# Patient Record
Sex: Male | Born: 1965 | Race: Black or African American | Hispanic: No | Marital: Single | Smoking: Light tobacco smoker
Health system: Southern US, Community
[De-identification: ages and names within clinical notes are randomized; demographics above are authoritative.]

## PROBLEM LIST (undated history)

## (undated) DIAGNOSIS — K2970 Gastritis, unspecified, without bleeding: Secondary | ICD-10-CM

## (undated) DIAGNOSIS — R011 Cardiac murmur, unspecified: Secondary | ICD-10-CM

## (undated) DIAGNOSIS — A539 Syphilis, unspecified: Secondary | ICD-10-CM

## (undated) DIAGNOSIS — F1110 Opioid abuse, uncomplicated: Secondary | ICD-10-CM

## (undated) DIAGNOSIS — Z9289 Personal history of other medical treatment: Secondary | ICD-10-CM

## (undated) DIAGNOSIS — K8020 Calculus of gallbladder without cholecystitis without obstruction: Secondary | ICD-10-CM

## (undated) HISTORY — DX: Personal history of other medical treatment: Z92.89

## (undated) HISTORY — DX: Syphilis, unspecified: A53.9

---

## 2002-07-25 ENCOUNTER — Emergency Department: Admit: 2002-07-25 | Payer: Self-pay | Source: Emergency Department | Admitting: Emergency Medicine

## 2005-03-01 ENCOUNTER — Emergency Department: Admit: 2005-03-01 | Payer: Self-pay | Source: Emergency Department | Admitting: Emergency Medicine

## 2005-03-01 LAB — CBC WITH AUTO DIFFERENTIAL CERNER
Basophils Absolute: 0.1 /mm3 (ref 0.0–0.2)
Basophils: 1 % (ref 0–2)
Eosinophils Absolute: 0.4 /mm3 (ref 0.0–0.7)
Eosinophils: 6 % — ABNORMAL HIGH (ref 0–5)
Granulocytes Absolute: 3.9 /mm3 (ref 1.8–8.1)
Hematocrit: 40 % — ABNORMAL LOW (ref 42.0–52.0)
Hgb: 13.8 G/DL (ref 13.0–17.0)
Lymphocytes Absolute: 2.5 /mm3 (ref 0.5–4.4)
Lymphocytes: 33 % (ref 15–41)
MCH: 31.7 PG (ref 28.0–32.0)
MCHC: 34.4 G/DL (ref 32.0–36.0)
MCV: 92 FL (ref 80.0–100.0)
MPV: 8 FL (ref 7.4–10.4)
Monocytes Absolute: 0.8 /mm3 (ref 0.0–1.2)
Monocytes: 10 % (ref 0–11)
Neutrophils %: 51 % — ABNORMAL LOW (ref 52–75)
Platelets: 180 /mm3 (ref 140–400)
RBC: 4.35 /mm3 — ABNORMAL LOW (ref 4.70–6.00)
RDW: 13 % (ref 11.5–15.0)
WBC: 7.7 /mm3 (ref 3.5–10.8)

## 2005-03-01 LAB — BASIC METABOLIC PANEL
BUN: 13 mg/dL (ref 8–20)
CO2: 29 mEq/L (ref 21–30)
Calcium: 9.4 mg/dL (ref 8.6–10.2)
Chloride: 101 mEq/L (ref 98–107)
Creatinine: 0.9 mg/dL (ref 0.6–1.5)
Glucose: 83 mg/dL (ref 70–100)
Potassium: 4.4 mEq/L (ref 3.6–5.0)
Sodium: 138 mEq/L (ref 136–146)

## 2005-03-01 LAB — GFR

## 2005-03-01 LAB — LIPASE: Lipase: 85 U/L (ref 32–219)

## 2005-06-22 ENCOUNTER — Emergency Department: Admit: 2005-06-22 | Payer: Self-pay | Source: Emergency Department | Admitting: Pediatric Emergency Medicine

## 2006-03-19 ENCOUNTER — Emergency Department: Admit: 2006-03-19 | Payer: Self-pay | Source: Emergency Department | Admitting: Emergency Medicine

## 2006-03-19 LAB — CBC WITH AUTO DIFFERENTIAL CERNER
Basophils Absolute: 0.1 /mm3 (ref 0.0–0.2)
Basophils: 1 % (ref 0–2)
Eosinophils Absolute: 0.6 /mm3 — ABNORMAL HIGH (ref 0.0–0.2)
Eosinophils: 7 % — ABNORMAL HIGH (ref 0–5)
Granulocytes Absolute: 3.9 /mm3 (ref 1.8–8.1)
Hematocrit: 38.8 % — ABNORMAL LOW (ref 42.0–52.0)
Hgb: 12.9 G/DL — ABNORMAL LOW (ref 13.0–17.0)
Lymphocytes Absolute: 2.5 /mm3 (ref 0.5–4.4)
Lymphocytes: 32 % (ref 15–41)
MCH: 31.9 PG (ref 28.0–32.0)
MCHC: 33.2 G/DL (ref 32.0–36.0)
MCV: 95.9 FL (ref 80.0–100.0)
MPV: 8.3 FL (ref 7.4–10.4)
Monocytes Absolute: 0.7 /mm3 (ref 0.0–1.2)
Monocytes: 9 % (ref 0–11)
Neutrophils %: 51 % — ABNORMAL LOW (ref 52–75)
Platelets: 211 /mm3 (ref 140–400)
RBC: 4.05 /mm3 — ABNORMAL LOW (ref 4.70–6.00)
RDW: 13.3 % (ref 11.5–15.0)
WBC: 7.7 /mm3 (ref 3.5–10.8)

## 2006-03-19 LAB — COMPREHENSIVE METABOLIC PANEL - AH CERNER
ALT: 20 U/L (ref 0–41)
AST (SGOT): 30 U/L (ref 0–37)
Albumin/Globulin Ratio: 1.4 (ref 1.1–2.2)
Albumin: 4.4 g/dL (ref 3.4–4.8)
Alkaline Phosphatase: 68 U/L (ref 40–129)
Anion Gap: 8 mEq/L (ref 5–15)
BUN: 14 mg/dL (ref 6–20)
Bilirubin, Total: 0.6 mg/dL (ref 0.0–1.0)
CA: 4.1 mEq/L (ref 3.8–4.6)
CO2: 30.5 mEq/L — ABNORMAL HIGH (ref 22.0–29.0)
Calcium: 9.6 mg/dL (ref 8.4–10.2)
Chloride: 103 mEq/L (ref 96–108)
Creatinine: 1 mg/dL (ref 0.5–1.2)
Globulin: 3.1 g/dL (ref 2.0–3.6)
Glucose: 78 mg/dL
Osmolality Calculated: 291 mosm/kg (ref 282–298)
Potassium: 4.1 mEq/L (ref 3.3–5.1)
Protein, Total: 7.5 g/dL (ref 6.4–8.3)
Sodium: 141 mEq/L (ref 135–145)
UN/CREA SOFT: 14 RATIO (ref 6–33)

## 2006-03-19 LAB — URINALYSIS WITH MICROSCOPIC
Bilirubin, UA: NEGATIVE
Blood, UA: NEGATIVE
Glucose, UA: NEGATIVE
Ketones UA: NEGATIVE
Leukocyte Esterase, UA: NEGATIVE
Nitrite, UA: NEGATIVE
Protein, UR: NEGATIVE
Specific Gravity UA POCT: 1.026 (ref 1.005–1.030)
Urine pH: 5 (ref 4.6–8.0)
Urobilinogen, UA: 0.2 EU/dL (ref 0.2–1.0)

## 2006-03-19 LAB — CREATINE KINASE W/O REFLEX (SOFT): Creatine Kinase (CK): 429 U/L — ABNORMAL HIGH (ref 24–195)

## 2006-03-19 LAB — PT/INR
PT INR: 0.9 {INR} (ref 0.9–1.1)
PT: 11.2 s (ref 10.8–13.3)

## 2006-03-19 LAB — CKMB - AH CERNER: MB ACS: 4.63 NG/ML (ref 0.00–7.20)

## 2006-03-19 LAB — HEMOLYSIS INDEX: Hemolysis Index: 15 Units

## 2006-03-19 LAB — TROPONIN I: Troponin I: 0.01 ng/mL — AB (ref 0.10–1.30)

## 2006-03-19 LAB — GFR

## 2006-05-12 ENCOUNTER — Emergency Department: Admit: 2006-05-12 | Payer: Self-pay | Source: Emergency Department | Admitting: Emergency Medicine

## 2006-05-12 LAB — CBC WITH AUTO DIFFERENTIAL CERNER
Basophils Absolute: 0.1 /mm3 (ref 0.0–0.2)
Basophils: 2 % (ref 0–2)
Eosinophils Absolute: 0.3 /mm3 (ref 0.0–0.7)
Eosinophils: 4 % (ref 0–5)
Granulocytes Absolute: 3.9 /mm3 (ref 1.8–8.1)
Hematocrit: 37.3 % — ABNORMAL LOW (ref 42.0–52.0)
Hgb: 12.6 G/DL — ABNORMAL LOW (ref 13.0–17.0)
Lymphocytes Absolute: 2.4 /mm3 (ref 0.5–4.4)
Lymphocytes: 32 % (ref 15–41)
MCH: 31.2 PG (ref 28.0–32.0)
MCHC: 33.9 G/DL (ref 32.0–36.0)
MCV: 92.1 FL (ref 80.0–100.0)
MPV: 8.1 FL (ref 7.4–10.4)
Monocytes Absolute: 0.8 /mm3 (ref 0.0–1.2)
Monocytes: 11 % (ref 0–11)
Neutrophils %: 52 % (ref 52–75)
Platelets: 167 /mm3 (ref 140–400)
RBC: 4.05 /mm3 — ABNORMAL LOW (ref 4.70–6.00)
RDW: 13.4 % (ref 11.5–15.0)
WBC: 7.5 /mm3 (ref 3.5–10.8)

## 2006-05-12 LAB — BASIC METABOLIC PANEL
BUN: 10 mg/dL (ref 8–20)
CO2: 31 mEq/L — ABNORMAL HIGH (ref 21–30)
Calcium: 9.3 mg/dL (ref 8.6–10.2)
Chloride: 106 mEq/L (ref 98–107)
Creatinine: 1.1 mg/dL (ref 0.6–1.5)
Glucose: 70 mg/dL (ref 70–100)
Potassium: 3.8 mEq/L (ref 3.6–5.0)
Sodium: 142 mEq/L (ref 136–146)

## 2006-05-12 LAB — GFR

## 2006-05-12 LAB — I-STAT TROPONIN CERNER: i-STAT Troponin: 0 ng/mL

## 2007-08-15 ENCOUNTER — Emergency Department: Admit: 2007-08-15 | Payer: Self-pay | Source: Emergency Department

## 2007-08-20 ENCOUNTER — Emergency Department: Admit: 2007-08-20 | Payer: Self-pay | Source: Emergency Department | Admitting: Emergency Medicine

## 2007-08-20 LAB — RAPID INFLUENZA A/B ANTIGENS: Influenza Ag: NEGATIVE

## 2007-09-11 ENCOUNTER — Emergency Department: Admit: 2007-09-11 | Payer: Self-pay | Source: Emergency Department | Admitting: Emergency Medicine

## 2007-09-12 LAB — COMPREHENSIVE METABOLIC PANEL - AH CERNER
ALT: 19 U/L (ref 0–41)
AST (SGOT): 36 U/L (ref 0–37)
Albumin/Globulin Ratio: 1.4 (ref 1.1–2.2)
Albumin: 4.2 g/dL (ref 3.4–4.8)
Alkaline Phosphatase: 61 U/L (ref 40–129)
Anion Gap: 12 mEq/L (ref 5–15)
BUN: 16 mg/dL (ref 6–20)
Bilirubin, Total: 1.3 mg/dL — ABNORMAL HIGH (ref 0.0–1.0)
CA: 3.9 mEq/L (ref 3.8–4.6)
CO2: 24.5 mEq/L (ref 22.0–29.0)
Calcium: 9 mg/dL (ref 8.4–10.2)
Chloride: 101 mEq/L (ref 96–108)
Creatinine: 1 mg/dL (ref 0.5–1.2)
Globulin: 2.9 g/dL (ref 2.0–3.6)
Glucose: 80 mg/dL (ref 70–100)
Osmolality Calculated: 284 mosm/kg (ref 282–298)
Potassium: 3.7 mEq/L (ref 3.3–5.1)
Protein, Total: 7.1 g/dL (ref 6.4–8.3)
Sodium: 137 mEq/L (ref 133–145)
UN/CREA SOFT: 16 RATIO (ref 6–33)

## 2007-09-12 LAB — CBC AND DIFFERENTIAL
Basophils Absolute: 0 /mm3 (ref 0.0–0.2)
Basophils: 0 % (ref 0–2)
Eosinophils Absolute: 0.1 /mm3 (ref 0.0–0.7)
Eosinophils: 1 % (ref 0–5)
Granulocytes Absolute: 12.2 /mm3 — ABNORMAL HIGH (ref 1.8–8.1)
Hematocrit: 36.7 % — ABNORMAL LOW (ref 42.0–52.0)
Hgb: 13.1 G/DL (ref 13.0–17.0)
Immature Granulocytes Absolute: 0 CUMM (ref 0.0–0.0)
Immature Granulocytes: 0 % (ref 0–1)
Lymphocytes Absolute: 1.9 /mm3 (ref 0.5–4.4)
Lymphocytes: 12 % — ABNORMAL LOW (ref 15–41)
MCH: 32.3 PG — ABNORMAL HIGH (ref 28.0–32.0)
MCHC: 35.7 G/DL (ref 32.0–36.0)
MCV: 90.4 FL (ref 80.0–100.0)
MPV: 9.8 FL (ref 9.4–12.3)
Monocytes Absolute: 1.4 /mm3 — ABNORMAL HIGH (ref 0.0–1.2)
Monocytes: 9 % (ref 0–11)
Neutrophils %: 78 % — ABNORMAL HIGH (ref 52–75)
Platelets: 196 /mm3 (ref 140–400)
RBC: 4.06 /mm3 — ABNORMAL LOW (ref 4.70–6.00)
RDW: 13.3 % (ref 11.5–15.0)
WBC: 15.63 /mm3 — ABNORMAL HIGH (ref 3.50–10.80)

## 2007-09-12 LAB — AMYLASE: Amylase: 49 U/L (ref 20–148)

## 2007-09-12 LAB — TROPONIN I: Troponin I: 0 ng/mL — AB

## 2007-09-12 LAB — ETHANOL: Alcohol: 36 mg/dl

## 2007-09-12 LAB — LIPASE: Lipase: 35 U/L (ref 13–60)

## 2007-09-12 LAB — CKMB - AH CERNER
MB ACS: 9.36 NG/ML — ABNORMAL HIGH (ref 0.00–7.20)
MB Ind: 1.3 RI (ref 0.0–2.2)

## 2007-09-12 LAB — CREATINE KINASE W/O REFLEX (SOFT): Creatine Kinase (CK): 714 U/L — ABNORMAL HIGH (ref 24–195)

## 2007-09-12 LAB — HEMOLYSIS INDEX: Hemolysis Index: 4 Units

## 2007-09-12 LAB — GFR

## 2008-11-22 ENCOUNTER — Observation Stay
Admission: EM | Admit: 2008-11-22 | Discharge status: Against Medical Advice | Payer: Self-pay | Source: Emergency Department | Admitting: Internal Medicine

## 2008-11-22 LAB — COMPREHENSIVE METABOLIC PANEL
ALT: 12 U/L (ref 0–55)
AST (SGOT): 24 U/L (ref 5–34)
Albumin/Globulin Ratio: 1.2 (ref 0.9–2.2)
Albumin: 4.3 g/dL (ref 3.5–5.0)
Alkaline Phosphatase: 75 U/L (ref 40–150)
BUN: 15 mg/dL (ref 9–21)
Bilirubin, Total: 0.5 mg/dL (ref 0.2–1.2)
CO2: 13 mEq/L — ABNORMAL LOW (ref 22–29)
Calcium: 9.6 mg/dL (ref 8.5–10.5)
Chloride: 103 mEq/L (ref 96–107)
Creatinine: 1.5 mg/dL — ABNORMAL HIGH (ref 0.7–1.3)
Globulin: 3.5 g/dL (ref 2.0–3.6)
Glucose: 120 mg/dL — ABNORMAL HIGH (ref 70–110)
Potassium: 4 mEq/L (ref 3.5–5.1)
Protein, Total: 7.8 g/dL (ref 6.0–8.3)
Sodium: 138 mEq/L (ref 136–145)

## 2008-11-22 LAB — TROPONIN I: Troponin I: 0 ng/mL — AB

## 2008-11-22 LAB — CBC AND DIFFERENTIAL
Basophils Absolute: 0 /mm3 (ref 0.0–0.2)
Basophils: 0 % (ref 0–2)
Eosinophils Absolute: 0.2 /mm3 (ref 0.0–0.7)
Eosinophils: 1 % (ref 0–5)
Granulocytes Absolute: 12.4 /mm3 — ABNORMAL HIGH (ref 1.8–8.1)
Hematocrit: 37.2 % — ABNORMAL LOW (ref 42.0–52.0)
Hgb: 12.9 G/DL — ABNORMAL LOW (ref 13.0–17.0)
Immature Granulocytes Absolute: 0.1 CUMM — ABNORMAL HIGH (ref 0.0–0.0)
Immature Granulocytes: 1 % (ref 0–1)
Lymphocytes Absolute: 2.6 /mm3 (ref 0.5–4.4)
Lymphocytes: 16 % (ref 15–41)
MCH: 31.3 PG (ref 28.0–32.0)
MCHC: 34.7 G/DL (ref 32.0–36.0)
MCV: 90.3 FL (ref 80.0–100.0)
MPV: 10.4 FL (ref 9.4–12.3)
Monocytes Absolute: 1 /mm3 (ref 0.0–1.2)
Monocytes: 6 % (ref 0–11)
Neutrophils %: 76 % — ABNORMAL HIGH (ref 52–75)
Platelets: 213 /mm3 (ref 140–400)
RBC: 4.12 /mm3 — ABNORMAL LOW (ref 4.70–6.00)
RDW: 12.7 % (ref 11.5–15.0)
WBC: 16.22 /mm3 — ABNORMAL HIGH (ref 3.50–10.80)

## 2008-11-22 LAB — CKMB MASS CERNER: CKMB Mass: 1.9 ng/mL (ref 0.0–5.0)

## 2008-11-22 LAB — DRUG SCREEN,URINE RANDOM CERNER

## 2008-11-22 LAB — URINALYSIS WITH MICROSCOPIC
Bilirubin, UA: NEGATIVE
Blood, UA: NEGATIVE
Glucose, UA: NEGATIVE
Hyaline Casts, UA: 3 /LPF — ABNORMAL HIGH (ref 0–2)
Ketones UA: NEGATIVE
Leukocyte Esterase, UA: NEGATIVE
Nitrite, UA: NEGATIVE
Protein, UR: 100 — AB
RBC, UA: 1 /HPF (ref 0–3)
Specific Gravity UA POCT: 1.015 (ref 1.005–1.030)
Urine pH: 5 (ref 4.6–8.0)
Urobilinogen, UA: NORMAL mg/dL
WBC, UA: 1 /HPF (ref 0–5)

## 2008-11-22 LAB — PT AND APTT
PT INR: 1.1 {INR} (ref 0.9–1.1)
PT: 13.2 s (ref 10.8–13.3)
PTT: 23 s (ref 21–32)

## 2008-11-22 LAB — TYPE AND SCREEN: AB Screen Gel: NEGATIVE

## 2008-11-22 LAB — GFR

## 2008-11-22 LAB — CREATINE KINASE W/O REFLEX (SOFT): Creatine Kinase (CK): 363 U/L — ABNORMAL HIGH (ref 30–200)

## 2008-11-22 LAB — ETHANOL: Alcohol: <10 mg/dl

## 2008-11-22 LAB — HEMOLYSIS INDEX: Hemolysis Index: 10 Units

## 2008-11-22 LAB — B-TYPE NATRIURETIC PEPTIDE: B-Natriuretic Peptide: 40 pg/mL (ref ?–100)

## 2008-11-23 LAB — PT/INR
PT INR: 1.1 {INR} (ref 0.9–1.1)
PT: 13.3 s (ref 10.8–13.3)

## 2008-11-23 LAB — CBC
Hematocrit: 33.4 % — ABNORMAL LOW (ref 42.0–52.0)
Hgb: 11.4 G/DL — ABNORMAL LOW (ref 13.0–17.0)
MCH: 30.4 PG (ref 28.0–32.0)
MCHC: 34.1 G/DL (ref 32.0–36.0)
MCV: 89.1 FL (ref 80.0–100.0)
MPV: 10.1 FL (ref 9.4–12.3)
Platelets: 178 /mm3 (ref 140–400)
RBC: 3.75 /mm3 — ABNORMAL LOW (ref 4.70–6.00)
RDW: 12.8 % (ref 11.5–15.0)
WBC: 15.93 /mm3 — ABNORMAL HIGH (ref 3.50–10.80)

## 2008-11-23 LAB — ACETAMINOPHEN LEVEL: Acetaminophen Level: <3 ug/mL (ref 10–30)

## 2008-11-23 LAB — SALICYLATE LEVEL: Salicylate Level: <5 mg/dL (ref 3–25)

## 2008-11-23 LAB — COMPREHENSIVE METABOLIC PANEL
ALT: 10 U/L (ref 0–55)
AST (SGOT): 23 U/L (ref 5–34)
Albumin/Globulin Ratio: 1.3 (ref 0.9–2.2)
Albumin: 3.2 g/dL — ABNORMAL LOW (ref 3.5–5.0)
Alkaline Phosphatase: 64 U/L (ref 40–150)
BUN: 16 mg/dL (ref 9–21)
Bilirubin, Total: 1 mg/dL (ref 0.2–1.2)
CO2: 19 mEq/L — ABNORMAL LOW (ref 22–29)
Calcium: 7.8 mg/dL — ABNORMAL LOW (ref 8.5–10.5)
Chloride: 110 mEq/L — ABNORMAL HIGH (ref 96–107)
Creatinine: 1.5 mg/dL — ABNORMAL HIGH (ref 0.7–1.3)
Globulin: 2.5 g/dL (ref 2.0–3.6)
Glucose: 94 mg/dL (ref 70–110)
Potassium: 3.9 mEq/L (ref 3.5–5.1)
Protein, Total: 5.7 g/dL — ABNORMAL LOW (ref 6.0–8.3)
Sodium: 138 mEq/L (ref 136–145)

## 2008-11-23 LAB — CKMB MASS CERNER
CKMB Mass: 2.8 ng/mL (ref 0.0–5.0)
CKMB Mass: 2.9 ng/mL (ref 0.0–5.0)

## 2008-11-23 LAB — CREATINE KINASE W/O REFLEX (SOFT)
Creatine Kinase (CK): 512 U/L — ABNORMAL HIGH (ref 30–200)
Creatine Kinase (CK): 666 U/L — ABNORMAL HIGH (ref 30–200)

## 2008-11-23 LAB — TROPONIN I
Troponin I: 0.01 ng/mL
Troponin I: 0 ng/mL — AB

## 2008-11-23 LAB — ETHANOL: Alcohol: <10 mg/dl

## 2008-11-23 LAB — HEMOLYSIS INDEX: Hemolysis Index: 14 Units

## 2008-11-25 ENCOUNTER — Inpatient Hospital Stay
Admission: EM | Admit: 2008-11-25 | Disposition: A | Discharge status: Home / Self Care | Payer: Self-pay | Source: Emergency Department | Admitting: Internal Medicine

## 2008-11-25 LAB — GFR

## 2008-11-25 LAB — COMPREHENSIVE METABOLIC PANEL
ALT: 20 U/L (ref 0–55)
AST (SGOT): 54 U/L — ABNORMAL HIGH (ref 5–34)
Albumin/Globulin Ratio: 1.1 (ref 0.9–2.2)
Albumin: 3.3 g/dL — ABNORMAL LOW (ref 3.5–5.0)
Alkaline Phosphatase: 53 U/L (ref 40–150)
BUN: 13 mg/dL (ref 9–21)
Bilirubin, Total: 0.9 mg/dL (ref 0.2–1.2)
CO2: 22 mEq/L (ref 22–29)
Calcium: 9 mg/dL (ref 8.5–10.5)
Chloride: 111 mEq/L — ABNORMAL HIGH (ref 96–107)
Creatinine: 2 mg/dL — ABNORMAL HIGH (ref 0.7–1.3)
Globulin: 2.9 g/dL (ref 2.0–3.6)
Glucose: 129 mg/dL — ABNORMAL HIGH (ref 70–110)
Potassium: 4.3 mEq/L (ref 3.5–5.1)
Protein, Total: 6.2 g/dL (ref 6.0–8.3)
Sodium: 142 mEq/L (ref 136–145)

## 2008-11-25 LAB — CBC AND DIFFERENTIAL
Basophils Absolute: 0 /mm3 (ref 0.0–0.2)
Basophils Absolute: 0 /mm3 (ref 0.0–0.2)
Basophils: 0 % (ref 0–2)
Basophils: 0 % (ref 0–2)
Eosinophils Absolute: 0.3 /mm3 (ref 0.0–0.7)
Eosinophils Absolute: 0.2 /mm3 (ref 0.0–0.7)
Eosinophils: 2 % (ref 0–5)
Eosinophils: 2 % (ref 0–5)
Granulocytes Absolute: 6.8 /mm3 (ref 1.8–8.1)
Granulocytes Absolute: 5.8 /mm3 (ref 1.8–8.1)
Hematocrit: 32.5 % — ABNORMAL LOW (ref 42.0–52.0)
Hematocrit: 31.5 % — ABNORMAL LOW (ref 42.0–52.0)
Hgb: 11.2 G/DL — ABNORMAL LOW (ref 13.0–17.0)
Hgb: 10.8 G/DL — ABNORMAL LOW (ref 13.0–17.0)
Immature Granulocytes Absolute: 0 CUMM (ref 0.0–0.0)
Immature Granulocytes Absolute: 0 CUMM (ref 0.0–0.0)
Immature Granulocytes: 0 % (ref 0–1)
Immature Granulocytes: 0 % (ref 0–1)
Lymphocytes Absolute: 2.7 /mm3 (ref 0.5–4.4)
Lymphocytes Absolute: 1.8 /mm3 (ref 0.5–4.4)
Lymphocytes: 25 % (ref 15–41)
Lymphocytes: 21 % (ref 15–41)
MCH: 30.7 PG (ref 28.0–32.0)
MCH: 30.7 PG (ref 28.0–32.0)
MCHC: 34.5 G/DL (ref 32.0–36.0)
MCHC: 34.3 G/DL (ref 32.0–36.0)
MCV: 89 FL (ref 80.0–100.0)
MCV: 89.5 FL (ref 80.0–100.0)
MPV: 10.6 FL (ref 9.4–12.3)
MPV: 10.4 FL (ref 9.4–12.3)
Monocytes Absolute: 1.1 /mm3 (ref 0.0–1.2)
Monocytes Absolute: 0.9 /mm3 (ref 0.0–1.2)
Monocytes: 11 % (ref 0–11)
Monocytes: 10 % (ref 0–11)
Neutrophils %: 62 % (ref 52–75)
Neutrophils %: 67 % (ref 52–75)
Platelets: 176 /mm3 (ref 140–400)
Platelets: 166 /mm3 (ref 140–400)
RBC: 3.65 /mm3 — ABNORMAL LOW (ref 4.70–6.00)
RBC: 3.52 /mm3 — ABNORMAL LOW (ref 4.70–6.00)
RDW: 13 % (ref 11.5–15.0)
RDW: 13 % (ref 11.5–15.0)
WBC: 10.9 /mm3 — ABNORMAL HIGH (ref 3.50–10.80)
WBC: 8.64 /mm3 (ref 3.50–10.80)

## 2008-11-25 LAB — DRUG SCREEN,URINE RANDOM CERNER

## 2008-11-25 LAB — URINALYSIS WITH MICROSCOPIC
Bilirubin, UA: NEGATIVE
Blood, UA: NEGATIVE
Glucose, UA: NEGATIVE
Ketones UA: NEGATIVE
Leukocyte Esterase, UA: NEGATIVE
Nitrite, UA: NEGATIVE
Protein, UR: NEGATIVE
RBC, UA: 1 /HPF (ref 0–3)
Specific Gravity UA POCT: 1.005 (ref 1.005–1.030)
Urine pH: 6 (ref 4.6–8.0)
Urobilinogen, UA: NORMAL mg/dL
WBC, UA: 5 /HPF (ref 0–5)

## 2008-11-25 LAB — TROPONIN I
Troponin I: 0 ng/mL — AB
Troponin I: 0 ng/mL — AB

## 2008-11-25 LAB — CKMB MASS CERNER
CKMB Mass: 2 ng/mL (ref 0.0–5.0)
CKMB Mass: 1.5 ng/mL (ref 0.0–5.0)

## 2008-11-25 LAB — CREATINE KINASE W/O REFLEX (SOFT)
Creatine Kinase (CK): 2215 U/L — ABNORMAL HIGH (ref 30–200)
Creatine Kinase (CK): 2039 U/L — ABNORMAL HIGH (ref 30–200)

## 2008-11-25 LAB — HEMOLYSIS INDEX
Hemolysis Index: 37 Units
Hemolysis Index: 13 Units

## 2008-11-25 LAB — ETHANOL: Alcohol: <10 mg/dl

## 2008-11-26 LAB — HEMOGLOBIN A1C: Hemoglobin A1C: 5.7 % (ref ?–6.0)

## 2008-11-26 LAB — COMPREHENSIVE METABOLIC PANEL
ALT: 26 U/L (ref 0–55)
AST (SGOT): 61 U/L — ABNORMAL HIGH (ref 5–34)
Albumin/Globulin Ratio: 1.1 (ref 0.9–2.2)
Albumin: 3.1 g/dL — ABNORMAL LOW (ref 3.5–5.0)
Alkaline Phosphatase: 57 U/L (ref 40–150)
BUN: 11 mg/dL (ref 9–21)
Bilirubin, Total: 1.1 mg/dL (ref 0.2–1.2)
CO2: 25 mEq/L (ref 22–29)
Calcium: 8.6 mg/dL (ref 8.5–10.5)
Chloride: 110 mEq/L — ABNORMAL HIGH (ref 96–107)
Creatinine: 1.5 mg/dL — ABNORMAL HIGH (ref 0.7–1.3)
Globulin: 2.8 g/dL (ref 2.0–3.6)
Glucose: 84 mg/dL (ref 70–110)
Potassium: 4.5 mEq/L (ref 3.5–5.1)
Protein, Total: 5.9 g/dL — ABNORMAL LOW (ref 6.0–8.3)
Sodium: 141 mEq/L (ref 136–145)

## 2008-11-26 LAB — PT/INR
PT INR: 1.1 {INR} (ref 0.9–1.1)
PT: 13.2 s (ref 10.8–13.3)

## 2008-11-26 LAB — APTT: PTT: 27 s (ref 21–32)

## 2008-11-26 LAB — TROPONIN I: Troponin I: 0 ng/mL — AB

## 2008-11-26 LAB — PHOSPHORUS: Phosphorus: 3.9 MG/DL (ref 2.3–4.7)

## 2008-11-26 LAB — CKMB MASS CERNER: CKMB Mass: 1.1 ng/mL (ref 0.0–5.0)

## 2008-11-26 LAB — HEMOLYSIS INDEX: Hemolysis Index: 3 Units

## 2008-11-26 LAB — CREATINE KINASE W/O REFLEX (SOFT): Creatine Kinase (CK): 2501 U/L — AB (ref 30–200)

## 2008-11-26 LAB — TSH: TSH: 0.452 u[IU]/mL (ref 0.340–4.820)

## 2008-11-26 LAB — MAGNESIUM: Magnesium: 2.2 MG/DL (ref 1.3–2.6)

## 2008-11-27 LAB — COMPREHENSIVE METABOLIC PANEL
ALT: 26 U/L (ref 0–55)
AST (SGOT): 56 U/L — ABNORMAL HIGH (ref 5–34)
Albumin/Globulin Ratio: 1.2 (ref 0.9–2.2)
Albumin: 3 g/dL — ABNORMAL LOW (ref 3.5–5.0)
Alkaline Phosphatase: 51 U/L (ref 40–150)
BUN: 7 mg/dL — ABNORMAL LOW (ref 9–21)
Bilirubin, Total: 1 mg/dL (ref 0.2–1.2)
CO2: 25 mEq/L (ref 22–29)
Calcium: 8.4 mg/dL — ABNORMAL LOW (ref 8.5–10.5)
Chloride: 111 mEq/L — ABNORMAL HIGH (ref 96–107)
Creatinine: 1.2 mg/dL (ref 0.7–1.3)
Globulin: 2.6 g/dL (ref 2.0–3.6)
Glucose: 86 mg/dL (ref 70–110)
Potassium: 3.9 mEq/L (ref 3.5–5.1)
Protein, Total: 5.6 g/dL — ABNORMAL LOW (ref 6.0–8.3)
Sodium: 142 mEq/L (ref 136–145)

## 2008-11-27 LAB — CKMB MASS CERNER: CKMB Mass: 0.6 ng/mL — AB (ref 0.0–5.0)

## 2008-11-27 LAB — GFR

## 2008-11-27 LAB — HEMOLYSIS INDEX: Hemolysis Index: 1 Units

## 2008-11-27 LAB — H. PYLORI ANTIBODY, IGG: H Pylori Index: NEGATIVE

## 2008-11-27 LAB — CREATINE KINASE W/O REFLEX (SOFT): Creatine Kinase (CK): 2245 U/L — ABNORMAL HIGH (ref 30–200)

## 2008-11-28 LAB — COMPREHENSIVE METABOLIC PANEL
ALT: 25 U/L (ref 0–55)
AST (SGOT): 41 U/L — ABNORMAL HIGH (ref 5–34)
Albumin/Globulin Ratio: 1.1 (ref 0.9–2.2)
Albumin: 3.1 g/dL — ABNORMAL LOW (ref 3.5–5.0)
Alkaline Phosphatase: 52 U/L (ref 40–150)
BUN: 5 mg/dL — ABNORMAL LOW (ref 9–21)
Bilirubin, Total: 0.8 mg/dL (ref 0.2–1.2)
CO2: 26 mEq/L (ref 22–29)
Calcium: 8.8 mg/dL (ref 8.5–10.5)
Chloride: 109 mEq/L — ABNORMAL HIGH (ref 96–107)
Creatinine: 1.1 mg/dL (ref 0.7–1.3)
Globulin: 2.7 g/dL (ref 2.0–3.6)
Glucose: 85 mg/dL (ref 70–110)
Potassium: 3.8 mEq/L (ref 3.5–5.1)
Protein, Total: 5.8 g/dL — ABNORMAL LOW (ref 6.0–8.3)
Sodium: 143 mEq/L (ref 136–145)

## 2008-11-28 LAB — HEMOLYSIS INDEX: Hemolysis Index: 1 Units

## 2008-11-28 LAB — GFR

## 2008-11-28 LAB — CKMB MASS CERNER: CKMB Mass: 0.4 ng/mL — AB (ref 0.0–5.0)

## 2008-11-28 LAB — CREATINE KINASE W/O REFLEX (SOFT): Creatine Kinase (CK): 1332 U/L — ABNORMAL HIGH (ref 30–200)

## 2009-06-27 ENCOUNTER — Emergency Department: Admit: 2009-06-27 | Payer: Self-pay | Source: Emergency Department | Admitting: Emergency Medicine

## 2009-06-27 LAB — CBC AND DIFFERENTIAL
Baso(Absolute): 0.04 10*3/uL (ref 0.00–0.20)
Basophils: 0 % (ref 0–2)
Eosinophils Absolute: 0.22 10*3/uL (ref 0.00–0.70)
Eosinophils: 2 % (ref 0–5)
Hematocrit: 38.9 % — ABNORMAL LOW (ref 42.0–52.0)
Hgb: 13.7 g/dL (ref 13.0–17.0)
Immature Granulocytes Absolute: 0.05 10*3/uL
Immature Granulocytes: 0 % (ref 0–1)
Lymphocytes Absolute: 2.3 10*3/uL (ref 0.50–4.40)
Lymphocytes: 17 % (ref 15–41)
MCH: 30.9 pg (ref 28.0–32.0)
MCHC: 35.2 g/dL (ref 32.0–36.0)
MCV: 87.8 fL (ref 80.0–100.0)
MPV: 10.3 fL (ref 9.4–12.3)
Monocytes Absolute: 0.65 10*3/uL (ref 0.00–1.20)
Monocytes: 5 % (ref 0–11)
Neutrophils Absolute: 10.64 10*3/uL
Neutrophils: 77 % — ABNORMAL HIGH (ref 52–75)
Platelets: 234 10*3/uL (ref 140–400)
RBC: 4.43 10*6/uL — ABNORMAL LOW (ref 4.70–6.00)
RDW: 13 % (ref 12–15)
WBC: 13.85 10*3/uL — ABNORMAL HIGH (ref 3.50–10.80)

## 2009-06-27 LAB — COMPREHENSIVE METABOLIC PANEL
ALT: 14 U/L (ref 0–55)
AST (SGOT): 20 U/L (ref 5–34)
Albumin/Globulin Ratio: 1.2 (ref 0.9–2.2)
Albumin: 4.1 g/dL (ref 3.5–5.0)
Alkaline Phosphatase: 73 U/L (ref 40–150)
BUN: 12 mg/dL (ref 9.0–21.0)
Bilirubin, Total: 0.8 mg/dL (ref 0.2–1.2)
CO2: 26 mEq/L (ref 22–29)
Calcium: 10.1 mg/dL (ref 8.5–10.5)
Chloride: 102 mEq/L (ref 98–107)
Creatinine: 1 mg/dL (ref 0.7–1.3)
Globulin: 3.4 g/dL (ref 2.0–3.6)
Glucose: 119 mg/dL — ABNORMAL HIGH (ref 70–100)
Potassium: 4 mEq/L (ref 3.5–5.1)
Protein, Total: 7.5 g/dL (ref 6.0–8.3)
Sodium: 137 mEq/L (ref 136–145)

## 2009-06-27 LAB — RAPID DRUG SCREEN, URINE
Cocaine, UR: DETECTED — AB
Opiate Screen, UR: DETECTED — AB

## 2009-06-27 LAB — GFR: EGFR: >60

## 2009-06-27 LAB — ETHANOL

## 2009-06-27 LAB — CK: Creatine Kinase (CK): 137 U/L (ref 30–200)

## 2009-06-27 LAB — HEMOLYSIS INDEX: Hemolysis Index: 4 Index (ref 0–18)

## 2009-06-27 LAB — TROPONIN I: Troponin I: 0.01 ng/mL (ref 0.00–0.09)

## 2009-07-08 ENCOUNTER — Emergency Department: Admit: 2009-07-08 | Payer: Self-pay | Source: Emergency Department | Admitting: Emergency Medicine

## 2009-07-09 LAB — CBC AND DIFFERENTIAL
Baso(Absolute): 0.02 10*3/uL (ref 0.00–0.20)
Basophils: 0 % (ref 0–2)
Eosinophils Absolute: 0.03 10*3/uL (ref 0.00–0.70)
Eosinophils: 0 % (ref 0–5)
Hematocrit: 42.9 % (ref 42.0–52.0)
Hgb: 15.1 g/dL (ref 13.0–17.0)
Immature Granulocytes Absolute: 0.03 10*3/uL
Immature Granulocytes: 0 % (ref 0–1)
Lymphocytes Absolute: 0.88 10*3/uL (ref 0.50–4.40)
Lymphocytes: 8 % — ABNORMAL LOW (ref 15–41)
MCH: 31.3 pg (ref 28.0–32.0)
MCHC: 35.2 g/dL (ref 32.0–36.0)
MCV: 88.8 fL (ref 80.0–100.0)
MPV: 10.8 fL (ref 9.4–12.3)
Monocytes Absolute: 0.42 10*3/uL (ref 0.00–1.20)
Monocytes: 4 % (ref 0–11)
Neutrophils Absolute: 9.54 10*3/uL
Neutrophils: 88 % — ABNORMAL HIGH (ref 52–75)
Platelets: 240 10*3/uL (ref 140–400)
RBC: 4.83 10*6/uL (ref 4.70–6.00)
RDW: 13 % (ref 12–15)
WBC: 10.89 10*3/uL — ABNORMAL HIGH (ref 3.50–10.80)

## 2009-07-09 LAB — RAPID DRUG SCREEN, URINE
Cocaine, UR: DETECTED — AB
Opiate Screen, UR: DETECTED — AB

## 2009-07-09 LAB — COMPREHENSIVE METABOLIC PANEL
ALT: 14 U/L (ref 0–55)
AST (SGOT): 22 U/L (ref 5–34)
Albumin/Globulin Ratio: 1.2 (ref 0.9–2.2)
Albumin: 4.7 g/dL (ref 3.5–5.0)
Alkaline Phosphatase: 86 U/L (ref 40–150)
Anion Gap: 13 (ref 5.0–15.0)
BUN: 8 mg/dL — ABNORMAL LOW (ref 9.0–21.0)
Bilirubin, Total: 0.9 mg/dL (ref 0.2–1.2)
CO2: 24 mEq/L (ref 22–29)
Calcium: 10.9 mg/dL — ABNORMAL HIGH (ref 8.5–10.5)
Chloride: 106 mEq/L (ref 98–107)
Creatinine: 1 mg/dL (ref 0.7–1.3)
Globulin: 3.9 g/dL — ABNORMAL HIGH (ref 2.0–3.6)
Glucose: 134 mg/dL — ABNORMAL HIGH (ref 70–100)
Potassium: 4.4 mEq/L (ref 3.5–5.1)
Protein, Total: 8.6 g/dL — ABNORMAL HIGH (ref 6.0–8.3)
Sodium: 143 mEq/L (ref 136–145)

## 2009-07-09 LAB — GFR: EGFR: >60

## 2009-07-09 LAB — URINALYSIS, REFLEX TO MICROSCOPIC EXAM IF INDICATED
Bilirubin, UA: NEGATIVE
Blood, UA: NEGATIVE
Glucose, UA: NEGATIVE
Ketones UA: 100 — AB
Leukocyte Esterase, UA: NEGATIVE
Nitrite, UA: NEGATIVE
Protein, UR: NEGATIVE
RBC, UA: 3 /HPF (ref 0–5)
Specific Gravity UA POCT: 1.005 (ref 1.001–1.035)
Squamous Epithelial Cells, Urine: <1 /HPF (ref 0–25)
Urine pH: 9 — AB (ref 5.0–8.0)
Urobilinogen, UA: NORMAL mg/dL

## 2009-07-09 LAB — LIPASE: Lipase: 34 U/L (ref 8–78)

## 2009-07-09 LAB — HEMOLYSIS INDEX: Hemolysis Index: 16 Index (ref 0–18)

## 2009-07-10 ENCOUNTER — Observation Stay
Admission: EM | Admit: 2009-07-10 | Discharge status: Against Medical Advice | Payer: Self-pay | Source: Emergency Department | Admitting: Internal Medicine

## 2009-07-10 LAB — RAPID DRUG SCREEN, URINE
Cocaine, UR: DETECTED — AB
Opiate Screen, UR: DETECTED — AB

## 2009-07-10 LAB — TROPONIN I: Troponin I: <0.01 ng/mL (ref 0.00–0.03)

## 2009-07-10 LAB — PT AND APTT
PT INR: 1.1 (ref 0.9–1.1)
PT: 13.1 s (ref 10.8–13.3)
PTT: 24 s (ref 21–32)

## 2009-07-10 LAB — CK: Creatine Kinase (CK): 159 U/L (ref 20–297)

## 2009-07-11 LAB — GFR: EGFR: >60

## 2009-07-11 LAB — BASIC METABOLIC PANEL
BUN: 13 mg/dL (ref 7–21)
CO2: 31 mEq/L (ref 22–31)
Calcium: 9 mg/dL (ref 8.6–10.2)
Chloride: 102 mEq/L (ref 98–107)
Creatinine: 1 mg/dL (ref 0.5–1.4)
Glucose: 103 mg/dL — ABNORMAL HIGH (ref 70–100)
Potassium: 3.5 mEq/L — ABNORMAL LOW (ref 3.6–5.0)
Sodium: 138 mEq/L (ref 136–143)

## 2009-07-11 LAB — CBC
Hematocrit: 36.8 % — ABNORMAL LOW (ref 42.0–52.0)
Hgb: 13 g/dL (ref 13.0–17.0)
MCH: 31.7 pg (ref 28.0–32.0)
MCHC: 35.3 g/dL (ref 32.0–36.0)
MCV: 89.8 fL (ref 80.0–100.0)
MPV: 10.3 fL (ref 9.4–12.3)
Platelets: 223 10*3/uL (ref 140–400)
RBC: 4.1 10*6/uL — ABNORMAL LOW (ref 4.70–6.00)
RDW: 13 % (ref 12–15)
WBC: 10.76 10*3/uL (ref 3.50–10.80)

## 2009-07-11 LAB — CK
Creatine Kinase (CK): 151 U/L (ref 20–297)
Creatine Kinase (CK): 123 U/L (ref 20–297)

## 2009-07-11 LAB — TROPONIN I
Troponin I: <0.01 ng/mL (ref 0.00–0.03)
Troponin I: <0.01 ng/mL (ref 0.00–0.03)

## 2009-09-19 ENCOUNTER — Emergency Department: Admit: 2009-09-19 | Payer: Self-pay | Source: Emergency Department | Admitting: Emergency Medicine

## 2009-09-19 LAB — CBC AND DIFFERENTIAL
Baso(Absolute): 0.04 10*3/uL (ref 0.00–0.20)
Basophils: 0 % (ref 0–2)
Eosinophils Absolute: 0.3 10*3/uL (ref 0.00–0.70)
Eosinophils: 3 % (ref 0–5)
Hematocrit: 38.5 % — ABNORMAL LOW (ref 42.0–52.0)
Hgb: 12.9 g/dL — ABNORMAL LOW (ref 13.0–17.0)
Immature Granulocytes Absolute: 0.03 10*3/uL
Immature Granulocytes: 0 % (ref 0–1)
Lymphocytes Absolute: 3.45 10*3/uL (ref 0.50–4.40)
Lymphocytes: 32 % (ref 15–41)
MCH: 30.8 pg (ref 28.0–32.0)
MCHC: 33.5 g/dL (ref 32.0–36.0)
MCV: 91.9 fL (ref 80.0–100.0)
MPV: 10.3 fL (ref 9.4–12.3)
Monocytes Absolute: 0.95 10*3/uL (ref 0.00–1.20)
Monocytes: 9 % (ref 0–11)
Neutrophils Absolute: 6.04 10*3/uL
Neutrophils: 56 % (ref 52–75)
Platelets: 255 10*3/uL (ref 140–400)
RBC: 4.19 10*6/uL — ABNORMAL LOW (ref 4.70–6.00)
RDW: 13 % (ref 12–15)
WBC: 10.78 10*3/uL (ref 3.50–10.80)

## 2009-09-19 LAB — COMPREHENSIVE METABOLIC PANEL
ALT: 26 U/L (ref 21–72)
AST (SGOT): 32 U/L (ref 20–57)
Albumin/Globulin Ratio: 1.2 (ref 1.1–1.8)
Albumin: 4 g/dL (ref 3.7–5.1)
Alkaline Phosphatase: 73 U/L (ref 43–122)
BUN: 18 mg/dL (ref 7–21)
Bilirubin, Total: 0.4 mg/dL (ref 0.2–1.3)
CO2: 31 mEq/L (ref 22–31)
Calcium: 9.3 mg/dL (ref 8.6–10.2)
Chloride: 102 mEq/L (ref 98–107)
Creatinine: 1 mg/dL (ref 0.5–1.4)
Globulin: 3.4 g/dL (ref 2.0–3.7)
Glucose: 96 mg/dL (ref 70–100)
Potassium: 4.5 mEq/L (ref 3.6–5.0)
Protein, Total: 7.4 g/dL (ref 6.0–8.0)
Sodium: 138 mEq/L (ref 136–143)

## 2009-09-19 LAB — CKMB: Creatinine Kinase MB (CKMB): <1 ng/mL (ref 0.0–4.9)

## 2009-09-19 LAB — RAPID DRUG SCREEN, URINE
Cocaine, UR: DETECTED — AB
Opiate Screen, UR: DETECTED — AB

## 2009-09-19 LAB — CK: Creatine Kinase (CK): 266 U/L (ref 20–297)

## 2009-09-19 LAB — ETHANOL

## 2009-09-19 LAB — GFR: EGFR: >60

## 2009-09-19 LAB — TROPONIN I: Troponin I: <0.01 ng/mL (ref 0.00–0.03)

## 2009-09-26 ENCOUNTER — Emergency Department: Admit: 2009-09-26 | Payer: Self-pay | Source: Emergency Department | Admitting: Emergency Medicine

## 2009-09-26 LAB — CBC AND DIFFERENTIAL
Baso(Absolute): 0.02 10*3/uL (ref 0.00–0.20)
Basophils: 0 % (ref 0–2)
Eosinophils Absolute: 0.17 10*3/uL (ref 0.00–0.70)
Eosinophils: 2 % (ref 0–5)
Hematocrit: 37.5 % — ABNORMAL LOW (ref 42.0–52.0)
Hgb: 12.8 g/dL — ABNORMAL LOW (ref 13.0–17.0)
Immature Granulocytes Absolute: 0.02 10*3/uL
Immature Granulocytes: 0 % (ref 0–1)
Lymphocytes Absolute: 1.61 10*3/uL (ref 0.50–4.40)
Lymphocytes: 16 % (ref 15–41)
MCH: 30.5 pg (ref 28.0–32.0)
MCHC: 34.1 g/dL (ref 32.0–36.0)
MCV: 89.5 fL (ref 80.0–100.0)
MPV: 10.5 fL (ref 9.4–12.3)
Monocytes Absolute: 0.79 10*3/uL (ref 0.00–1.20)
Monocytes: 8 % (ref 0–11)
Neutrophils Absolute: 7.52 10*3/uL
Neutrophils: 74 % (ref 52–75)
Platelets: 235 10*3/uL (ref 140–400)
RBC: 4.19 10*6/uL — ABNORMAL LOW (ref 4.70–6.00)
RDW: 13 % (ref 12–15)
WBC: 10.11 10*3/uL (ref 3.50–10.80)

## 2009-09-26 LAB — HEMOLYSIS INDEX: Hemolysis Index: 28 Index — ABNORMAL HIGH (ref 0–18)

## 2009-09-26 LAB — URINALYSIS, REFLEX TO MICROSCOPIC EXAM IF INDICATED
Bilirubin, UA: NEGATIVE
Blood, UA: NEGATIVE
Glucose, UA: NEGATIVE
Ketones UA: NEGATIVE
Leukocyte Esterase, UA: NEGATIVE
Nitrite, UA: NEGATIVE
Protein, UR: NEGATIVE
RBC, UA: 8 /HPF (ref 0–5)
Specific Gravity UA POCT: 1.015 (ref 1.001–1.035)
Squamous Epithelial Cells, Urine: <1 /HPF (ref 0–25)
Urine pH: 9 — AB (ref 5.0–8.0)
Urobilinogen, UA: NORMAL mg/dL

## 2009-09-26 LAB — BASIC METABOLIC PANEL
Anion Gap: 11 (ref 5.0–15.0)
BUN: 9 mg/dL (ref 9.0–21.0)
CO2: 25 mEq/L (ref 22–29)
Calcium: 9.6 mg/dL (ref 8.5–10.5)
Chloride: 105 mEq/L (ref 98–107)
Creatinine: 1 mg/dL (ref 0.7–1.3)
Glucose: 112 mg/dL — ABNORMAL HIGH (ref 70–100)
Potassium: 4.1 mEq/L (ref 3.5–5.1)
Sodium: 141 mEq/L (ref 136–145)

## 2009-09-26 LAB — GFR: EGFR: >60

## 2009-09-26 LAB — LIPASE: Lipase: 29 U/L (ref 8–78)

## 2009-11-19 ENCOUNTER — Observation Stay
Admission: AD | Admit: 2009-11-19 | Disposition: A | Discharge status: Home / Self Care | Payer: Self-pay | Source: Emergency Department | Admitting: Internal Medicine

## 2009-11-19 LAB — URINALYSIS, REFLEX TO MICROSCOPIC EXAM IF INDICATED
Bilirubin, UA: NEGATIVE
Blood, UA: NEGATIVE
Glucose, UA: NEGATIVE
Ketones UA: 100 — AB
Leukocyte Esterase, UA: NEGATIVE
Nitrite, UA: NEGATIVE
Protein, UR: NEGATIVE
Specific Gravity UA POCT: 1.005 (ref 1.001–1.035)
Urine pH: 8 (ref 5.0–8.0)
Urobilinogen, UA: NORMAL mg/dL

## 2009-11-19 LAB — CBC AND DIFFERENTIAL
Baso(Absolute): 0.03 10*3/uL (ref 0.00–0.20)
Basophils: 0 % (ref 0–2)
Eosinophils Absolute: 0.06 10*3/uL (ref 0.00–0.70)
Eosinophils: 1 % (ref 0–5)
Hematocrit: 35.9 % — ABNORMAL LOW (ref 42.0–52.0)
Hgb: 12.5 g/dL — ABNORMAL LOW (ref 13.0–17.0)
Immature Granulocytes Absolute: 0.02 10*3/uL
Immature Granulocytes: 0 % (ref 0–1)
Lymphocytes Absolute: 2.04 10*3/uL (ref 0.50–4.40)
Lymphocytes: 20 % (ref 15–41)
MCH: 30.8 pg (ref 28.0–32.0)
MCHC: 34.8 g/dL (ref 32.0–36.0)
MCV: 88.4 fL (ref 80.0–100.0)
MPV: 10.1 fL (ref 9.4–12.3)
Monocytes Absolute: 0.84 10*3/uL (ref 0.00–1.20)
Monocytes: 8 % (ref 0–11)
Neutrophils Absolute: 7.3 10*3/uL
Neutrophils: 71 % (ref 52–75)
Platelets: 200 10*3/uL (ref 140–400)
RBC: 4.06 10*6/uL — ABNORMAL LOW (ref 4.70–6.00)
RDW: 13 % (ref 12–15)
WBC: 10.27 10*3/uL (ref 3.50–10.80)

## 2009-11-19 LAB — COMPREHENSIVE METABOLIC PANEL
ALT: 25 U/L (ref 21–72)
AST (SGOT): 21 U/L (ref 20–57)
Albumin/Globulin Ratio: 1.3 (ref 1.1–1.8)
Albumin: 3.9 g/dL (ref 3.7–5.1)
Alkaline Phosphatase: 66 U/L (ref 43–122)
BUN: 10 mg/dL (ref 7–21)
Bilirubin, Total: 0.9 mg/dL (ref 0.2–1.3)
CO2: 26 mEq/L (ref 22–31)
Calcium: 9 mg/dL (ref 8.6–10.2)
Chloride: 103 mEq/L (ref 98–107)
Creatinine: 0.9 mg/dL (ref 0.5–1.4)
Globulin: 3.1 g/dL (ref 2.0–3.7)
Glucose: 108 mg/dL — ABNORMAL HIGH (ref 70–100)
Potassium: 3.5 mEq/L — ABNORMAL LOW (ref 3.6–5.0)
Protein, Total: 7 g/dL (ref 6.0–8.0)
Sodium: 135 mEq/L — ABNORMAL LOW (ref 136–143)

## 2009-11-19 LAB — TROPONIN I: Troponin I: <0.01 ng/mL (ref 0.00–0.03)

## 2009-11-19 LAB — LIPASE: Lipase: 233 U/L (ref 23–300)

## 2009-11-19 LAB — CK: Creatine Kinase (CK): 174 U/L (ref 20–297)

## 2009-11-19 LAB — AMYLASE: Amylase: 53 U/L (ref 30–110)

## 2009-11-19 LAB — GFR: EGFR: >60

## 2009-11-20 LAB — CBC
Hematocrit: 36.8 % — ABNORMAL LOW (ref 42.0–52.0)
Hgb: 12.8 g/dL — ABNORMAL LOW (ref 13.0–17.0)
MCH: 30.7 pg (ref 28.0–32.0)
MCHC: 34.8 g/dL (ref 32.0–36.0)
MCV: 88.2 fL (ref 80.0–100.0)
MPV: 10.2 fL (ref 9.4–12.3)
Platelets: 198 10*3/uL (ref 140–400)
RBC: 4.17 10*6/uL — ABNORMAL LOW (ref 4.70–6.00)
RDW: 13 % (ref 12–15)
WBC: 11.13 10*3/uL — ABNORMAL HIGH (ref 3.50–10.80)

## 2009-11-20 LAB — RAPID DRUG SCREEN, URINE
Cocaine, UR: DETECTED — AB
Opiate Screen, UR: DETECTED — AB

## 2009-11-20 LAB — BASIC METABOLIC PANEL
BUN: 8 mg/dL (ref 7–21)
CO2: 26 mEq/L (ref 22–31)
Calcium: 9.1 mg/dL (ref 8.6–10.2)
Chloride: 101 mEq/L (ref 98–107)
Creatinine: 0.8 mg/dL (ref 0.5–1.4)
Glucose: 109 mg/dL — ABNORMAL HIGH (ref 70–100)
Potassium: 4 mEq/L (ref 3.6–5.0)
Sodium: 134 mEq/L — ABNORMAL LOW (ref 136–143)

## 2009-11-20 LAB — GFR: EGFR: >60

## 2009-11-20 LAB — TROPONIN I: Troponin I: <0.01 ng/mL (ref 0.00–0.03)

## 2009-11-21 LAB — CBC AND DIFFERENTIAL
Baso(Absolute): 0.03 10*3/uL (ref 0.00–0.20)
Basophils: 0 % (ref 0–2)
Eosinophils Absolute: 0.01 10*3/uL (ref 0.00–0.70)
Eosinophils: 0 % (ref 0–5)
Hematocrit: 39.1 % — ABNORMAL LOW (ref 42.0–52.0)
Hgb: 13.3 g/dL (ref 13.0–17.0)
Immature Granulocytes Absolute: 0.05 10*3/uL
Immature Granulocytes: 0 % (ref 0–1)
Lymphocytes Absolute: 1.95 10*3/uL (ref 0.50–4.40)
Lymphocytes: 10 % — ABNORMAL LOW (ref 15–41)
MCH: 30.3 pg (ref 28.0–32.0)
MCHC: 34 g/dL (ref 32.0–36.0)
MCV: 89.1 fL (ref 80.0–100.0)
MPV: 10.5 fL (ref 9.4–12.3)
Monocytes Absolute: 2 10*3/uL — ABNORMAL HIGH (ref 0.00–1.20)
Monocytes: 10 % (ref 0–11)
Neutrophils Absolute: 15.41 10*3/uL
Neutrophils: 79 % — ABNORMAL HIGH (ref 52–75)
Platelets: 212 10*3/uL (ref 140–400)
RBC: 4.39 10*6/uL — ABNORMAL LOW (ref 4.70–6.00)
RDW: 13 % (ref 12–15)
WBC: 19.4 10*3/uL — ABNORMAL HIGH (ref 3.50–10.80)

## 2009-11-21 LAB — BASIC METABOLIC PANEL
BUN: 11 mg/dL (ref 7–21)
CO2: 26 mEq/L (ref 22–31)
Calcium: 9.5 mg/dL (ref 8.6–10.2)
Chloride: 103 mEq/L (ref 98–107)
Creatinine: 1.1 mg/dL (ref 0.5–1.4)
Glucose: 116 mg/dL — ABNORMAL HIGH (ref 70–100)
Potassium: 3.9 mEq/L (ref 3.6–5.0)
Sodium: 137 mEq/L (ref 136–143)

## 2009-11-21 LAB — GFR: EGFR: >60

## 2009-11-21 LAB — MAGNESIUM: Magnesium: 1.9 mg/dL (ref 1.6–2.3)

## 2009-11-22 LAB — CBC AND DIFFERENTIAL
Baso(Absolute): 0.05 10*3/uL (ref 0.00–0.20)
Basophils: 1 % (ref 0–2)
Eosinophils Absolute: 0.11 10*3/uL (ref 0.00–0.70)
Eosinophils: 1 % (ref 0–5)
Hematocrit: 35.8 % — ABNORMAL LOW (ref 42.0–52.0)
Hgb: 12.1 g/dL — ABNORMAL LOW (ref 13.0–17.0)
Immature Granulocytes Absolute: 0.02 10*3/uL
Immature Granulocytes: 0 % (ref 0–1)
Lymphocytes Absolute: 3.95 10*3/uL (ref 0.50–4.40)
Lymphocytes: 44 % — ABNORMAL HIGH (ref 15–41)
MCH: 30.2 pg (ref 28.0–32.0)
MCHC: 33.8 g/dL (ref 32.0–36.0)
MCV: 89.3 fL (ref 80.0–100.0)
MPV: 10.4 fL (ref 9.4–12.3)
Monocytes Absolute: 0.85 10*3/uL (ref 0.00–1.20)
Monocytes: 10 % (ref 0–11)
Neutrophils Absolute: 4.03 10*3/uL
Neutrophils: 45 % — ABNORMAL LOW (ref 52–75)
Platelets: 198 10*3/uL (ref 140–400)
RBC: 4.01 10*6/uL — ABNORMAL LOW (ref 4.70–6.00)
RDW: 13 % (ref 12–15)
WBC: 8.99 10*3/uL (ref 3.50–10.80)

## 2010-09-26 ENCOUNTER — Emergency Department: Admit: 2010-09-26 | Disposition: A | Discharge status: Home / Self Care | Payer: Self-pay | Source: Emergency Department

## 2010-09-26 ENCOUNTER — Inpatient Hospital Stay
Admission: EM | Admit: 2010-09-26 | Disposition: A | Discharge status: Home / Self Care | Payer: Self-pay | Source: Ambulatory Visit | Attending: Internal Medicine | Admitting: Internal Medicine

## 2010-09-26 LAB — URINALYSIS, REFLEX TO MICROSCOPIC EXAM IF INDICATED
Bilirubin, UA: NEGATIVE
Blood, UA: NEGATIVE
Glucose, UA: NEGATIVE
Ketones UA: 300 — AB
Leukocyte Esterase, UA: NEGATIVE
Nitrite, UA: NEGATIVE
Protein, UR: 100 — AB
Specific Gravity UA POCT: 1.03 (ref 1.001–1.035)
Urine pH: 6 (ref 5.0–8.0)
Urobilinogen, UA: NORMAL mg/dL

## 2010-09-26 LAB — CBC AND DIFFERENTIAL
Basophils Absolute Automated: 0.02 10*3/uL (ref 0.00–0.20)
Basophils Automated: 0 % (ref 0–2)
Eosinophils Absolute Automated: 0.05 10*3/uL (ref 0.00–0.70)
Eosinophils Automated: 0 % (ref 0–5)
Hematocrit: 39.7 % — ABNORMAL LOW (ref 42.0–52.0)
Hgb: 13.9 g/dL (ref 13.0–17.0)
Immature Granulocytes Absolute: 0.03 10*3/uL
Immature Granulocytes: 0 % (ref 0–1)
Lymphocytes Absolute Automated: 2.32 10*3/uL (ref 0.50–4.40)
Lymphocytes Automated: 18 % (ref 15–41)
MCH: 31.4 pg (ref 28.0–32.0)
MCHC: 35 g/dL (ref 32.0–36.0)
MCV: 89.6 fL (ref 80.0–100.0)
MPV: 11 fL (ref 9.4–12.3)
Monocytes Absolute Automated: 0.75 10*3/uL (ref 0.00–1.20)
Monocytes: 6 % (ref 0–11)
Neutrophils Absolute: 9.7 10*3/uL — ABNORMAL HIGH (ref 1.80–8.10)
Neutrophils: 76 % — ABNORMAL HIGH (ref 52–75)
Nucleated RBC: 0 /100 WBC
Platelets: 207 10*3/uL (ref 140–400)
RBC: 4.43 10*6/uL — ABNORMAL LOW (ref 4.70–6.00)
RDW: 13 % (ref 12–15)
WBC: 12.84 10*3/uL — ABNORMAL HIGH (ref 3.50–10.80)

## 2010-09-26 LAB — RAPID DRUG SCREEN, URINE
Barbiturate Screen, UR: NOT DETECTED
Benzodiazepine Screen, UR: NOT DETECTED
Cannabinoid Screen, UR: NOT DETECTED
Cocaine, UR: DETECTED — AB
Opiate Screen, UR: DETECTED — AB
PCP Screen, UR: NOT DETECTED
Urine Amphetamine Screen: NOT DETECTED

## 2010-09-26 LAB — PT AND APTT
PT INR: 1 (ref 0.9–1.1)
PT: 13.2 s (ref 12.6–15.0)
PTT: 31 s (ref 23–37)

## 2010-09-26 LAB — COMPREHENSIVE METABOLIC PANEL
ALT: 18 U/L — ABNORMAL LOW (ref 21–72)
AST (SGOT): 26 U/L (ref 20–57)
Albumin/Globulin Ratio: 1.5 (ref 1.1–1.8)
Albumin: 4.9 g/dL (ref 3.7–5.1)
Alkaline Phosphatase: 81 U/L (ref 43–122)
BUN: 15 mg/dL (ref 7–21)
Bilirubin, Total: 1.3 mg/dL (ref 0.2–1.3)
CO2: 28 mEq/L (ref 22–31)
Calcium: 9.9 mg/dL (ref 8.6–10.2)
Chloride: 104 mEq/L (ref 98–107)
Creatinine: 0.9 mg/dL (ref 0.5–1.4)
Globulin: 3.2 g/dL (ref 2.0–3.7)
Glucose: 95 mg/dL (ref 70–100)
Potassium: 3.9 mEq/L (ref 3.6–5.0)
Protein, Total: 8.1 g/dL — ABNORMAL HIGH (ref 6.0–8.0)
Sodium: 142 mEq/L (ref 136–143)

## 2010-09-26 LAB — LIPASE: Lipase: 117 U/L (ref 23–300)

## 2010-09-26 LAB — GFR: EGFR: >60

## 2010-09-27 LAB — CBC
Hematocrit: 35.8 % — ABNORMAL LOW (ref 42.0–52.0)
Hgb: 12.2 g/dL — ABNORMAL LOW (ref 13.0–17.0)
MCH: 31 pg (ref 28.0–32.0)
MCHC: 34.1 g/dL (ref 32.0–36.0)
MCV: 90.9 fL (ref 80.0–100.0)
MPV: 10.3 fL (ref 9.4–12.3)
Nucleated RBC: 0 /100 WBC
Platelets: 167 10*3/uL (ref 140–400)
RBC: 3.94 10*6/uL — ABNORMAL LOW (ref 4.70–6.00)
RDW: 13 % (ref 12–15)
WBC: 10.81 10*3/uL — ABNORMAL HIGH (ref 3.50–10.80)

## 2010-09-27 LAB — COMPREHENSIVE METABOLIC PANEL
ALT: 24 U/L (ref 21–72)
AST (SGOT): 23 U/L (ref 20–57)
Albumin/Globulin Ratio: 1.2 (ref 1.1–1.8)
Albumin: 3.8 g/dL (ref 3.7–5.1)
Alkaline Phosphatase: 68 U/L (ref 43–122)
BUN: 15 mg/dL (ref 7–21)
Bilirubin, Total: 1.1 mg/dL (ref 0.2–1.3)
CO2: 26 mEq/L (ref 22–31)
Calcium: 9.3 mg/dL (ref 8.6–10.2)
Chloride: 105 mEq/L (ref 98–107)
Creatinine: 0.9 mg/dL (ref 0.5–1.4)
Globulin: 3.1 g/dL (ref 2.0–3.7)
Glucose: 108 mg/dL — ABNORMAL HIGH (ref 70–100)
Potassium: 4.1 mEq/L (ref 3.6–5.0)
Protein, Total: 6.9 g/dL (ref 6.0–8.0)
Sodium: 140 mEq/L (ref 136–143)

## 2010-09-27 LAB — GFR: EGFR: >60

## 2010-09-29 LAB — CBC AND DIFFERENTIAL
Basophils Absolute Automated: 0.03 10*3/uL (ref 0.00–0.20)
Basophils Automated: 0 % (ref 0–2)
Eosinophils Absolute Automated: 0.11 10*3/uL (ref 0.00–0.70)
Eosinophils Automated: 2 % (ref 0–5)
Hematocrit: 31.4 % — ABNORMAL LOW (ref 42.0–52.0)
Hgb: 10.7 g/dL — ABNORMAL LOW (ref 13.0–17.0)
Immature Granulocytes Absolute: 0.02 10*3/uL
Immature Granulocytes: 0 % (ref 0–1)
Lymphocytes Absolute Automated: 3.31 10*3/uL (ref 0.50–4.40)
Lymphocytes Automated: 45 % — ABNORMAL HIGH (ref 15–41)
MCH: 31 pg (ref 28.0–32.0)
MCHC: 34.1 g/dL (ref 32.0–36.0)
MCV: 91 fL (ref 80.0–100.0)
MPV: 10.7 fL (ref 9.4–12.3)
Monocytes Absolute Automated: 0.64 10*3/uL (ref 0.00–1.20)
Monocytes: 9 % (ref 0–11)
Neutrophils Absolute: 3.31 10*3/uL (ref 1.80–8.10)
Neutrophils: 45 % — ABNORMAL LOW (ref 52–75)
Nucleated RBC: 0 /100 WBC
Platelets: 166 10*3/uL (ref 140–400)
RBC: 3.45 10*6/uL — ABNORMAL LOW (ref 4.70–6.00)
RDW: 13 % (ref 12–15)
WBC: 7.4 10*3/uL (ref 3.50–10.80)

## 2010-09-29 LAB — AMYLASE: Amylase: 41 U/L (ref 30–110)

## 2010-09-29 LAB — TSH: TSH: 1.38 u[IU]/mL (ref 0.465–4.680)

## 2010-09-29 LAB — MAGNESIUM: Magnesium: 1.7 mg/dL (ref 1.6–2.3)

## 2010-09-29 LAB — BASIC METABOLIC PANEL
BUN: 9 mg/dL (ref 7–21)
CO2: 28 mEq/L (ref 22–31)
Calcium: 8.5 mg/dL — ABNORMAL LOW (ref 8.6–10.2)
Chloride: 106 mEq/L (ref 98–107)
Creatinine: 0.9 mg/dL (ref 0.5–1.4)
Glucose: 85 mg/dL (ref 70–100)
Potassium: 3.7 mEq/L (ref 3.6–5.0)
Sodium: 138 mEq/L (ref 136–143)

## 2010-09-29 LAB — PHOSPHORUS: Phosphorus: 4 mg/dL (ref 2.5–4.5)

## 2010-09-29 LAB — LIPASE: Lipase: 96 U/L (ref 23–300)

## 2010-09-29 LAB — T4, FREE: T4 Free: 1.09 ng/dL (ref 0.78–1.85)

## 2010-09-29 LAB — GFR: EGFR: >60

## 2010-11-10 LAB — ECG 12-LEAD
Atrial Rate: 43 {beats}/min
P Axis: 13 degrees
P-R Interval: 128 ms
Q-T Interval: 480 ms
QRS Duration: 84 ms
QTC Calculation (Bezet): 405 ms
R Axis: -14 degrees
T Axis: -37 degrees
Ventricular Rate: 43 {beats}/min

## 2010-11-12 LAB — ECG 12-LEAD
Atrial Rate: 59 {beats}/min
Atrial Rate: 53 {beats}/min
P Axis: 28 degrees
P Axis: -2 degrees
P-R Interval: 146 ms
P-R Interval: 130 ms
Q-T Interval: 410 ms
Q-T Interval: 442 ms
QRS Duration: 86 ms
QRS Duration: 84 ms
QTC Calculation (Bezet): 405 ms
QTC Calculation (Bezet): 414 ms
R Axis: -25 degrees
R Axis: 0 degrees
T Axis: -21 degrees
T Axis: -34 degrees
Ventricular Rate: 59 {beats}/min
Ventricular Rate: 53 {beats}/min

## 2010-11-14 LAB — ECG 12-LEAD
Atrial Rate: 60 {beats}/min
Atrial Rate: 60 {beats}/min
P Axis: -1 degrees
P Axis: 48 degrees
P-R Interval: 134 ms
P-R Interval: 146 ms
Q-T Interval: 406 ms
Q-T Interval: 416 ms
QRS Duration: 84 ms
QRS Duration: 88 ms
QTC Calculation (Bezet): 406 ms
QTC Calculation (Bezet): 416 ms
R Axis: -15 degrees
R Axis: -17 degrees
T Axis: -1 degrees
T Axis: -21 degrees
Ventricular Rate: 60 {beats}/min
Ventricular Rate: 60 {beats}/min

## 2010-11-15 LAB — ECG 12-LEAD
Atrial Rate: 44 {beats}/min
Atrial Rate: 70 {beats}/min
P Axis: 12 degrees
P Axis: 47 degrees
P-R Interval: 124 ms
P-R Interval: 138 ms
Q-T Interval: 406 ms
Q-T Interval: 438 ms
QRS Duration: 88 ms
QRS Duration: 92 ms
QTC Calculation (Bezet): 374 ms
QTC Calculation (Bezet): 438 ms
R Axis: -6 degrees
R Axis: -7 degrees
T Axis: -27 degrees
T Axis: 27 degrees
Ventricular Rate: 44 {beats}/min
Ventricular Rate: 70 {beats}/min

## 2010-11-22 LAB — ECG 12-LEAD
Atrial Rate: 114 {beats}/min
Atrial Rate: 58 {beats}/min
Atrial Rate: 86 {beats}/min
P Axis: 54 degrees
P Axis: 36 degrees
P Axis: 58 degrees
P-R Interval: 156 ms
P-R Interval: 136 ms
P-R Interval: 154 ms
Q-T Interval: 342 ms
Q-T Interval: 358 ms
Q-T Interval: 406 ms
QRS Duration: 84 ms
QRS Duration: 78 ms
QRS Duration: 86 ms
QTC Calculation (Bezet): 471 ms
QTC Calculation (Bezet): 398 ms
QTC Calculation (Bezet): 428 ms
R Axis: -25 degrees
R Axis: -11 degrees
R Axis: -13 degrees
T Axis: 39 degrees
T Axis: -10 degrees
T Axis: 5 degrees
Ventricular Rate: 114 {beats}/min
Ventricular Rate: 58 {beats}/min
Ventricular Rate: 86 {beats}/min

## 2010-11-27 LAB — ECG 12-LEAD
Atrial Rate: 70 {beats}/min
P Axis: 51 degrees
P-R Interval: 140 ms
Q-T Interval: 380 ms
QRS Duration: 86 ms
QTC Calculation (Bezet): 410 ms
R Axis: -6 degrees
T Axis: 21 degrees
Ventricular Rate: 70 {beats}/min

## 2010-11-28 LAB — ECG 12-LEAD
Atrial Rate: 82 {beats}/min
P Axis: 55 degrees
P-R Interval: 152 ms
Q-T Interval: 386 ms
QRS Duration: 82 ms
QTC Calculation (Bezet): 450 ms
R Axis: -6 degrees
T Axis: 15 degrees
Ventricular Rate: 82 {beats}/min

## 2010-12-01 LAB — ECG 12-LEAD
Atrial Rate: 74 {beats}/min
P Axis: 48 degrees
P-R Interval: 140 ms
Q-T Interval: 400 ms
QRS Duration: 86 ms
QTC Calculation (Bezet): 444 ms
R Axis: -21 degrees
T Axis: 16 degrees
Ventricular Rate: 74 {beats}/min

## 2010-12-02 LAB — ECG 12-LEAD
Atrial Rate: 71 {beats}/min
P Axis: 47 degrees
P-R Interval: 146 ms
Q-T Interval: 378 ms
QRS Duration: 86 ms
QTC Calculation (Bezet): 410 ms
R Axis: -19 degrees
T Axis: -7 degrees
Ventricular Rate: 71 {beats}/min

## 2010-12-04 ENCOUNTER — Emergency Department
Admission: EM | Admit: 2010-12-04 | Discharge: 2010-12-04 | Disposition: A | Discharge status: Home / Self Care | Payer: Self-pay | Source: Emergency Department | Admitting: Internal Medicine

## 2010-12-04 LAB — CBC AND DIFFERENTIAL
Basophils Absolute Automated: 0.03 10*3/uL (ref 0.00–0.20)
Basophils Automated: 0 % (ref 0–2)
Eosinophils Absolute Automated: 0.14 10*3/uL (ref 0.00–0.70)
Eosinophils Automated: 2 % (ref 0–5)
Hematocrit: 39.1 % — ABNORMAL LOW (ref 42.0–52.0)
Hgb: 13.4 g/dL (ref 13.0–17.0)
Immature Granulocytes Absolute: 0.01 10*3/uL
Immature Granulocytes: 0 % (ref 0–1)
Lymphocytes Absolute Automated: 2.48 10*3/uL (ref 0.50–4.40)
Lymphocytes Automated: 30 % (ref 15–41)
MCH: 30.6 pg (ref 28.0–32.0)
MCHC: 34.3 g/dL (ref 32.0–36.0)
MCV: 89.3 fL (ref 80.0–100.0)
MPV: 10.7 fL (ref 9.4–12.3)
Monocytes Absolute Automated: 0.6 10*3/uL (ref 0.00–1.20)
Monocytes: 7 % (ref 0–11)
Neutrophils Absolute: 5.15 10*3/uL (ref 1.80–8.10)
Neutrophils: 61 % (ref 52–75)
Nucleated RBC: 0 /100 WBC
Platelets: 196 10*3/uL (ref 140–400)
RBC: 4.38 10*6/uL — ABNORMAL LOW (ref 4.70–6.00)
RDW: 13 % (ref 12–15)
WBC: 8.4 10*3/uL (ref 3.50–10.80)

## 2010-12-04 LAB — COMPREHENSIVE METABOLIC PANEL
ALT: 33 U/L (ref 21–72)
AST (SGOT): 30 U/L (ref 20–57)
Albumin/Globulin Ratio: 1.4 (ref 1.1–1.8)
Albumin: 4.5 g/dL (ref 3.7–5.1)
Alkaline Phosphatase: 84 U/L (ref 43–122)
BUN: 12 mg/dL (ref 7–21)
Bilirubin, Total: 1 mg/dL (ref 0.2–1.3)
CO2: 24 mEq/L (ref 22–31)
Calcium: 10.1 mg/dL (ref 8.6–10.2)
Chloride: 104 mEq/L (ref 98–107)
Creatinine: 0.8 mg/dL (ref 0.5–1.4)
Globulin: 3.3 g/dL (ref 2.0–3.7)
Glucose: 96 mg/dL (ref 70–100)
Potassium: 4.2 mEq/L (ref 3.6–5.0)
Protein, Total: 7.8 g/dL (ref 6.0–8.0)
Sodium: 139 mEq/L (ref 136–143)

## 2010-12-04 LAB — PT AND APTT
PT INR: 1 (ref 0.9–1.1)
PT: 12.9 s (ref 12.6–15.0)
PTT: 31 s (ref 23–37)

## 2010-12-04 LAB — URINALYSIS, REFLEX TO MICROSCOPIC EXAM IF INDICATED
Bilirubin, UA: NEGATIVE
Blood, UA: NEGATIVE
Glucose, UA: NEGATIVE
Ketones UA: 100 — AB
Leukocyte Esterase, UA: NEGATIVE
Nitrite, UA: NEGATIVE
Protein, UR: 30 — AB
Specific Gravity UA POCT: 1.005 (ref 1.001–1.035)
Urine pH: 8 (ref 5.0–8.0)
Urobilinogen, UA: NORMAL mg/dL

## 2010-12-04 LAB — RAPID DRUG SCREEN, URINE
Barbiturate Screen, UR: NOT DETECTED
Benzodiazepine Screen, UR: NOT DETECTED
Cannabinoid Screen, UR: NOT DETECTED
Cocaine, UR: NOT DETECTED
Opiate Screen, UR: DETECTED — AB
PCP Screen, UR: NOT DETECTED
Urine Amphetamine Screen: NOT DETECTED

## 2010-12-04 LAB — LIPASE: Lipase: 120 U/L (ref 23–300)

## 2010-12-04 LAB — GFR: EGFR: >60

## 2010-12-04 LAB — AMYLASE: Amylase: 50 U/L (ref 30–110)

## 2010-12-15 NOTE — Procedures (Signed)
Account Number: [id number]      Document ID: [id number]      Admit Date: 11/25/2008      Procedure Date: 11/25/2008      Order #:            Patient Location: [id number]      Patient Type: V            ATTENDING PHYSICIAN: Valente David, MD            PROCEDURE PERFORMED BY: Montey Hora MD                  TITLE OF PROCEDURE:      Electrocardiogram portion of a stress thallium study.            HISTORY OF PRESENT ILLNESS:      The patient is a 45 year old man with no known history of heart disease,      admitted to the hospital early today with complaints of atypical chest      pain.  The patient had been admitted earlier this week with combination of      cocaine, heroin, and alcohol ingestion.  CK was elevated, but troponins      were normal.  Stress thallium study was recommended to help exclude any      underlying coronary disease.            TECHNIQUE:      The patient exercised a total of 10 minutes into stage IV of standard Bruce      protocol achieving a peak heart rate of 160 beats per minute, which is 85%      maximum predicted heart rate response for age and represents a peak      workload of 11 to 12 METs.  Exercise was terminated because of headache and      generalized fatigue.  There was some sensation of chest discomfort      throughout the test but no typical angina with exercise.            Resting electrocardiogram shows sinus rhythm, rate 43 beats per minute, and      is otherwise normal.            Electrocardiogram at peak stress shows no diagnostic ST-segment changes.            Resting blood pressure was 150/90 mmHg.  The blood pressure at peak stress      was 160/90 mmHg.            No arrhythmias occurred with exercise.            IMPRESSION:      1.  Bruce treadmill stress test negative for angina or diagnostic ST      changes at 85% maximum predicted heart rate.      2.  Fair exercise tolerance (11 to 12 METs).      3.  Mild resting hypertension with "flat" blood pressure response to  graded      exercise.      4.  No arrhythmias.      5.  Thallium images pending.                        Electronic Signing Provider      _______________________________     Date/Time Signed: _____________      Montey Hora MD [phone])  D:  11/25/2008 14:50 PM by Dr. Celesta Gentile. Letitia Neri, MD [phone])      T:  11/25/2008 17:40 PM by [id number]          Everlean Cherry: [id number]) (Doc ID: [id number])                  [id number] Adriana Simas DO      Julienne Kass MD

## 2010-12-15 NOTE — Consults (Signed)
Account Number: [id number]      Document ID: [id number]      Admit Date: 11/25/2008      Service Date: 11/25/2008            Patient Location: [id number]      Patient Type: V            CONSULTING PHYSICIAN: Montey Hora MD            REFERRING PHYSICIAN: Luna Glasgow DO                  TYPE OF CONSULTATION:      Cardiology.            HISTORY OF PRESENT ILLNESS:      The patient is a 45 year old man admitted earlier this morning from the      emergency room where he presented complaining of chest pain.            The patient was hospitalized here overnight earlier this week following      ingestion of some combination of cocaine, heroin, and alcohol.  He reports      that he had not been using cocaine for several years, but was depressed at      that time and apparently had some suicidal ideation.  He was seen in      consult by Dr. Elpidio Anis of psychiatry service at the time, who felt that he      could be discharged to home.  He was discharged from the hospital on      September 28.  The patient says that he has had no further illicit      substances since.  He has been experiencing some ongoing chest pressure and      early this morning says while lying down he experienced some more      significant pain in his mid chest, which precipitated his coming back to      the hospital.  The patient describes vague chest tightness, which he says      is pleuritic.  There is no radiation of discomfort to the neck, shoulders,      or arms.  There is no clear relationship to activity.  CK in the emergency      room this morning was elevated to 2215 with negative MB and zero troponin.      Cardiology consultation has been requested to assist in the patient's      evaluation and management.            The patient has no known history of coronary disease.  Regarding risk      factors, he has no history of hypertension, diabetes mellitus, or known      hyperlipidemia.  He has smoked 1 to 1-1/2 pack of cigarettes per day on and       off since age 29.            FAMILY HISTORY:      Noncontributory.  His mother and father are both in their 36s and have no      known heart disease.  His siblings have no known heart disease.            MEDICATIONS:      The patient is on no medications  at the time of admission.            ALLERGIES:      He has no known medical allergies.  SOCIAL HISTORY:      The patient works as a Pharmacist, hospital.  He reports taking only occasional      alcohol.  He has a past history of heroin and cocaine use, although says      that the use earlier this week was the first time he had done so in several      years.            REVIEW OF SYSTEMS:      There is no recent history of febrile illness or skin rash.  There is no      history of thyroid disease.  No history of asthma, tuberculosis, or other      chronic lung disease.  Cardiac review of systems as above.  There is no      history of gallbladder disease, peptic ulcer disease, or colitis.  There is      no recent urologic symptoms.  No history of migraine headache, TIA, or      stroke.  No history of anemia.            PHYSICAL EXAMINATION:      GENERAL:  At the present time, the patient is resting comfortably in a bed      on the progressive care unit.  He is awake, alert, oriented, and a fairly      good historian.      VITAL SIGNS:  Blood pressure 155/92.  Heart rate in the 70s and regular,      respirations 17 per minute, temperature 97.3 degrees.  Oxygen saturation is      97% on room air.      SKIN:  Warm and dry.      HEENT:  Conjunctivae and sclerae are normal.  Mouth and gums unremarkable.      NECK:  There is no jugular venous distention.  The carotid upstroke is      intact bilaterally with no carotid bruits.  Palpable thyromegaly.      CHEST:  Clear with no rales, rhonchi, or wheezes.      CARDIAC:  There are no abnormal heaves, lifts, or thrills.  Left      ventricular impulse midclavicular line, left fifth intercostal space.      Rhythm is  regular.  S1 is normal intensity.  S2 splits normally with      respirations.  There is no S3 gallop.  There are no murmurs, clicks, or      rubs.      ABDOMEN:  Soft with no palpable hepatosplenomegaly.  There is some right      upper quadrant tenderness to palpation.      EXTREMITIES:  Peripheral pulses are intact and there is no pedal edema.            Electrocardiogram performed in the emergency room today shows sinus rhythm,      rate 58 beats per minute with minimal nonspecific ST-T abnormality.            PERTINENT LABORATORY DATA:      Include a white blood cell count today of 8.64; hemoglobin and hematocrit      10.8 and 31.5% respectively; platelet count 168,000.  Serum sodium is 142,      potassium 4.3, the BUN is 13 with a creatinine of 2.0.  BUN and creatinine      at the time of his discharge on the September 28, were 16 and 1.5  respectively.  CK at that time was 512.  Serial CKs and troponins during      the previous hospitalization were normal.            DIAGNOSTIC STUDIES:      A chest x-ray today shows normal cardiac silhouette with clear lung Ritacco.            IMPRESSIONS:      1.  Atypical chest pain, no evidence of acute myocardial injury.      2.  History of cocaine, heroin, and alcohol ingestion earlier this week.      3.  History of depression.      4.  Renal insufficiency.      5.  Elevated CK, worsening renal insufficiency, rule out rhabdomyolysis.            RECOMMENDATIONS:      1.  Thallium stress test to rule out fixed coronary disease (doubt).      2.  Check serum aldolase and follow up CKs.      3.  Hydrate.      4.  Would avoid beta-blocker usage in view of recent history of cocaine      ingestion.      5.  Agree with a H2 blocker for possible gastroesophageal symptoms.      6. Echocardiogram has already been ordered.  We will review.            Thank you very much for asking [place] to see the patient in consultation.                        Electronic Signing Provider       _______________________________     Date/Time Signed: _____________      Montey Hora MD [phone])            D:  11/25/2008 13:03 PM by Dr. Celesta Gentile. Letitia Neri, MD [phone])      T:  11/25/2008 13:29 PM by [id number]          Everlean Cherry: [id number]) (Doc ID: [id number])                  EX:[id number] Adriana Simas DO      Julienne Kass MD

## 2010-12-15 NOTE — H&P (Signed)
Account Number: [id number]      Document ID: [id number]      Admit Date: 11/25/2008            Patient Location: [id number]      Patient Type: I            ATTENDING PHYSICIAN: Valente David, MD                  CHIEF COMPLAINT:      The patient presents for evaluation of ongoing chest pain.            HISTORY OF PRESENT ILLNESS:      The patient was seen and examined.  The patient is a 45 year old male who      states that his chest pain woke him up at 3:00 a.m. having difficulty      breathing, like a pressure on his chest of 6-1/2 to 7/10 which continued      until he got on the treadmill for the stress test.  He does not normally      take medication and has no regular doctor.  Positive chest pain.  Positive      shortness of breath.  Positive nausea and vomiting x5.  Positive weakness      on stress test.  Positive fever and chills.  Positive for dizziness,      positive lightheadedness, positive diarrhea 2 to 3 times.  Positive blurry      vision.            ALLERGIES:      No known drug allergies.            MEDICATIONS:      No current medications.            PAST MEDICAL HISTORY:      He states no past medical history.            EMERGENCY ROOM ASSESSMENT:      Onset was 2 days ago.  Was here Tuesday and was diagnosed with pneumonia.      Left AMA to attend a court hearing.  Complained of chest pain, chills, and      fever tonight.            The patient is a 45 year old male brought in by EMS complaining of chest      pain, pressure that woke him up from bed an hour prior to arrival.  The      patient states that the pain is exacerbated with deep breathing.  The      patient also reports a productive cough and chills.  The patient admits the      last cocaine use 3 days ago.  The patient was also seen here in the ED 3      days ago for the same symptoms.  The patient was advised to get admitted      for further workup but left AMA due to a court date.  The patient denies      fever or vomiting.             PHYSICAL EXAMINATION:      VITAL SIGNS:  Blood pressure is 143/78, pulse is 57, respirations 20,      temperature 98.4.  Pain is 6/10, O2 saturation 100% on room air.      GENERAL:  Patient in no acute distress.      HEART:  Regular rate and rhythm.  Positive  S1 and S2.      PULMONARY:  The lungs are clear to auscultation bilaterally.      HEART:  Regular rate and rhythm for the heart on examination.      LUNGS:  Clear to auscultation bilaterally.      ABDOMEN:  Soft, nontender, nondistended, positive bowel sounds.      EXTREMITIES:  No pitting edema bilateral lower extremities.      NEUROLOGIC:  Cranial nerves II-XII are grossly intact.  There is no focal      neurologic deficit evident upon physical examination.            LABORATORY AND DIAGNOSTIC TESTING ON ADMISSION TO THE HOSPITAL:      Chest x-ray, 1 view portable.  Impression:  No active disease, no change      from study of November 23, 2008.            White blood cell count is 10.90.  H and H 11.2 and 32.5, platelet count was      136.  Sodium was 142, potassium 4.3, chloride is 111, CO2 is 22, BUN is 13,      creatinine 2.0, blood sugar was 129, calcium 9.0, albumin is 3.3.  AST is      54, ALT is 20, alkaline phosphatase is 53, CK-MB mass 2.0.  CK is 2215.      ETOH is less than 10.  Troponin I is less than 0.00.  Toxicity screen is      positive for benzodiazepines, positive for THC, marijuana, and positive for      cocaine.  UA is negative for UTI.            ASSESSMENT:      Chest pain and polysubstance abuse.            PLAN:      ER orders:  Chest, single-view portable; IV access, electrocardiogram, CBC      with auto differential, troponin I, alcohol level, cardiac monitor, oxygen,      drug screen, creatinine kinase, urinalysis with microscopic examination,      chemistry comprehensive panel.            ALLERGIES:      No known drug allergies.            HOSPITALIST ORDERS:      Chest pain with polysubstance abuse, admitted to telemetry.   Condition is      good.  O2 at 2 liters per minute by nasal cannula, IV fluids, Hep-Lock.            DIET:      A cardiac diet.            MEDICATIONS:      Protonix 40 mg IV daily, Tylenol 650 mg by mouth every 6 hours  p.r.n.      temperature greater than 100.4, Phenergan 6.25 mg IV every 6 hours p.r.n.      nausea and vomiting, Dilaudid 0.5 mg IV every 3 hours p.r.n. pain, aspirin      enteric-coated 325 mg by mouth daily, morphine 2 mg IV every 2 hours p.r.n.      chest pain.            LABORATORIES:      CBC with differential, CMP with a magnesium and phosphorus, TSH, hemoglobin      A1c, PT, PTT, and INR.  TED stockings bilaterally knee-high.  A 2D echo  with Doppler.  CK, CK-MB and troponin I every 6 hours x3, Lopressor 25 mg      by mouth b.i.d. We will go ahead and discontinue Lopressor 25 mg secondary      to cocaine use.  Will give Norvasc 10 mg by mouth daily for blood pressure,      also will given Levaquin 500 mg IV daily for any infectious process the      patient reports.            Note:      1. Chest pain, polysubstance abuse, stable.  We will continue supportive care.       We will rule out MI and consult cardiology.  Will follow up with patient      and will follow recommendations of cardiology.  Aspirin and morphine.  A 2D      echo with Doppler.  We will change Lopressor, Norvasc 10 mg by mouth daily.            TIME OF EVALUATION:      The evaluation took 60 minutes.                        Electronic Signing Provider      _______________________________     Date/Time Signed: _____________      Luna Glasgow DO [phone])            D:  11/27/2008 01:08 AM by Dr. Odella Aquas. [place], DO [phone])      T:  11/27/2008 06:05 AM by [id number]          Everlean Cherry: [id number]) (Doc ID: [id number])                  XB:[id number] Adriana Simas DO

## 2010-12-15 NOTE — Discharge Summary (Signed)
Account Number: [id number]      Document ID: [id number]      Admit Date: 11/25/2008      Discharge Date: 11/28/2008            ATTENDING PHYSICIAN:  Valente David, MD                  DISCHARGE DIAGNOSES:      1.  Chest pain secondary to cocaine, resolved.      2.  Rhabdomyolysis secondary to the cocaine, improving.      3.  Polysubstance abuse.      4.  Acute renal failure secondary to cocaine rhabdomyolysis, improving.            HISTORY OF PRESENT ILLNESS:      This is a 45 year old gentleman who initially was admitted to the hospital      on September 27, after he presented to the ED with chest pain and dizziness.      It seems that he had an argument with his girlfriend that he ingested a large      amount of cocaine, heroin, and alcohol and probably an attempt that he wanted      to kill himself. Then after the ingestion, the patient developed chest pain,      dizziness, and diaphoresis and then called EMS and was brought to the      hospital.      Psychiatric evaluation with  Dr. Elpidio Anis felt that he was not suicidal and      that he could be discharged to home.  It is not clear if the patient signed      an AMA or he was discharged, but then he came back on September 30 with      ongoing chest pain and [facility] have seen him on this second      admission.  It was noted that his chest pressure was no clear relationship to      the activity, but in the emergency room, his CK creatinine kinase was high      2200 and negative MBs, recommended to be admitted to the hospital and have a      thallium stress test to rule out any fixed coronary disease.            HOSPITAL COURSE BY ACTIVE PROBLEM:      1.  Chest pain, atypical.  The patient's cardiac enzymes were negative, had a      thallium stress test, which was also unremarkable.      2.  Rhabdomyolysis, secondary to the cocaine required aggressive      hydration and CK slowly trended down.      3.  Acute renal failure also secondary to the  rhabdomyolysis,      which was secondary to the cocaine and also required aggressive      hydration.  On the day of discharge, his BUN was 5 and creatinine was 1.1, so      clearly resolved secondary to the rhabdomyolysis.      Also on the day of discharge, his CK, from 2200, came down to 1300.      4.  Polysubstance abuse with cocaine and also alcohol.  The patient has      been counseled.  Then, he was offered for detoxification program.  The      patient was discharged in stable condition and, Dr. Catalina Antigua discharged him on  October 3.                        Electronic Signing Provider      _______________________________     Date/Time Signed: _____________      Valente David MD ([id number])            D:  12/26/2008 10:43 AM by Dr. Valente David, MD ([id number])      T:  12/27/2008 01:35 AM by [id number]          Everlean Cherry: [id number]) (Doc ID: [id number])                        [id number] Gweneth Dimitri MD

## 2010-12-15 NOTE — H&P (Signed)
[name], [name]      MRN:          [id number]      Account:      [id number]      Document ID:  [id number]                  Admit Date: 11/19/2009            Patient Location: M330-01      Patient Type: V            ATTENDING PHYSICIAN: Alean Rinne, MD                  CHIEF COMPLAINT:      Nausea, vomiting, abdominal pain.            HISTORY OF PRESENT ILLNESS:      The patient is a very pleasant 45 year old gentleman with history of      illicit drug use with multiple attempts to quit using heroin and cocaine      who presented with nausea, vomiting, and abdominal pain that started      yesterday.  He denies any hematemesis, no coffee-ground vomiting.  His      abdominal pain was in the epigastric region.  He did have a normal bowel      movement yesterday.  Denies any fever, no chills.  He was at another      hospital 2 weeks ago for similar complaints and was told he has a urinary      tract infection.  CT scan in the emergency room revealed bilateral renal      stones, nonobstructing.  The patient denies any hematuria, dysuria,      urgency, frequency.  He claims that he stopped using heroin and cocaine      about 4 days ago, and that he wants to quit.  He denies any recent weight      loss or weight gain.  Denies any other complaints.            ALLERGIES:      None.            PAST MEDICAL HISTORY:      See HPI and also illicit drug use, recurrent nausea, vomiting.  Normal      nuclear medicine stress test about 1 year ago, tobacco use, bilateral      kidney nonobstructing stones and a possible recent history of urinary tract      infection, history of depression and suicidal attempt.            FAMILY HISTORY:      Hypertension and hyperlipidemia.            PAST SURGICAL HISTORY:      None.            REVIEW OF SYSTEMS:      The patient denies any abdominal pain right now, but claims to feel queasy.       Did not have any vomiting since admission.  Denies any fever, no chills,      no headache, no visual  change.  No weakness, sensory loss in any of his      extremities.  He denies any chest pain, chest pressure or chest tightness  Page 1 of 3      NEEL, BUFFONE      MRN:          [id number]      Account:      [id number]      Document ID:  [id number]                  with or without exertion.  Denies any orthopnea.  Patient denies any      presyncope or syncope.            SOCIAL AND PERSONAL HISTORY:      He used to work as an Event organiser.  Lives with his girlfriend on and      off.  Heavy tobacco use, heavy heroin use, and occasional cocaine.  Denies      alcohol.            PHYSICAL EXAMINATION:      GENERAL:  Saw him in his room.  He was lying on his bed, alert, awake,      oriented x3, pleasant.  He was yawning repeatedly other than that, was      alert, awake, oriented x3.      VITAL SIGNS:  He had a temperature of 100.8 and a pulse rate in the 50s and      60s, respiratory rate was 19, blood pressure was 140/76, saturating 9%3 to      100%.      HEENT:  He had pink conjunctivae.  There was no icterus.  Pupils were      reactive.  Face was symmetric.  Oral mucosa moist without any lesions.      NECK:  Supple.  No cervical, submandibular lymphadenopathy.      CHEST:  Revealed decreased air entry in both lung Harb.  No crackles, no      wheezes though.  Symmetric air movement.      CARDIOVASCULAR:  S1, S2, rate and rhythm were regular, no murmurs, no      gallop.      ABDOMEN:  Completely benign, no mass, no tenderness, no guarding.  Positive      bowel sounds.      EXTREMITIES:  Without any edema.  Dorsalis pedis were palpable bilaterally,      some varicose veins on the left calf area.  Other than that, no cyanosis,      no clubbing.            LABORATORY DATA:      He had a CBC that showed WBC of 11, hemoglobin of 13.  BMP was completely      normal.  CT of his abdomen and pelvis without contrast revealed bilateral      nonobstructing kidney stones.  Troponin x2 was  completely negative.  His      EKG showed normal sinus rhythm.            ASSESSMENT AND PLAN:      1.  Nausea, vomiting, abdominal pain.  May be related to elicit drug      intoxication or withdrawal.  We will go ahead and check patient's drug      screen right now.  Patient did have history of hematemesis in the past.  We      will give him some intravenous Protonix, hydrate him, and watch him.      2.  Low-grade fever.  We will follow.  No signs of infection right  now.      3.  History of illicit drug use with cocaine and heroin sniffing.  Will      check urine drug screen right now.  Patient claims that he stopped using      drugs 4 days ago and wants to stop for completely.      4.  Claimed recent history of urinary tract infection, untreated.  The      patient's urinalysis was completely normal at this time.                                   Page 2 of 3      SHNEUR, WHITTENBURG      MRN:          [id number]      Account:      [id number]      Document ID:  [id number]                  5.  Bilateral renal stones as evident on CT scan.  Completely asymptomatic.       The patient denies any hematuria or any pain related to his kidney stones.            6.  Heavy tobacco use.      8.  History of depression and suicidal attempt.  Patient denies that      completely right now.            We will treat patient symptomatically.  Will follow if he develops frank      fever.  We will watch out for any cocaine and heroin withdrawal.                        Electronic Signing Provider            D:  11/20/2009 09:54 AM by Dr. Alean Rinne, MD ([id number])      T:  11/20/2009 13:35 PM by [id number]                  cc:                                   Page 3 of 3      Authenticated by Alean Rinne, MD ([id number]) On 12/20/2009 10:47:47 AM

## 2010-12-15 NOTE — Procedures (Signed)
Account Number: [id number]      Document ID: [id number]      Admit Date: 11/25/2008      Service Date: 11/25/2008      Order Number:            Patient Location: [id number]      Patient Type: V            PHYSICIAN/PROVIDER: Sandria Senter MD            REFERRING PHYSICIAN:                  INDICATIONS:      Chest pain.            M-MODE 2-D MEASUREMENTS:      LEFT ATRIUM:  41 mm.      AORTIC ROOT:  34 mm.      AORTIC LEAFLET SEPARATION:  21 mm.      RVD:  24 mm.      LVS:  35 mm.      LVD:  55 mm.      IVSD:  12 mm.      LVPWD:  11 mm.      FRACTIONAL SHORTENING:  37%.      ESTIMATED EJECTION FRACTION:  65%.            PLEURAL EFFUSION:  None.      PERICARDIAL EFFUSION:  None.            DOPPLER PEAK VELOCITIES:      MITRAL:  0.5 m/s.      AORTIC:   1.5 m/s.      PULMONIC:  0.9 m/s.      TRICUSPID:  0.5 m/s.      LVOT:  1.1 m/s.            DEGREE OF VALVULAR REGURGITATION:      MITRAL:  Mild.      AORTIC:  Trace.      TRICUSPID:  Mild.      ESTIMATED RIGHT VENTRICULAR SYSTOLIC PRESSURE:  37 mmHg.            DESCRIPTION OF FINDINGS:      Left ventricular systolic function is normal with an estimated ejection      fraction of 65%.  No discrete wall motion abnormalities are noted.  In the      apical views, both the left and right atria appear mildly enlarged.  The      aortic valve is trileaflet and appears structurally normal.  The mitral,      tricuspid, and pulmonic valves appear structurally normal.  No intracardiac      mass, thrombus, or vegetation is seen.  There is no pericardial effusion.            CONCLUSIONS:      1.  Normal left ventricular systolic function with an estimated ejection      fraction of 65%.      2.  Borderline concentric left ventricular hypertrophy.      3.  In the apical views the left and right atria appear mildly enlarged.      4.  No significant structural valvular abnormalities.      5.  No pericardial effusion.                        Electronic Signing Provider       _______________________________     Date/Time Signed: _____________  Sandria Senter MD ([id number])            D:  11/25/2008 17:01 PM by Dr. Marcene Brawn. Donnie Coffin, MD ([id number])      T:  11/25/2008 18:19 PM by [id number]          Everlean Cherry: [id number]) (Doc ID: [id number])                  IE:[id number] Donnie Coffin MD

## 2010-12-15 NOTE — H&P (Signed)
Account Number: [id number]      Document ID: [id number]      Admit Date: 11/22/2008            Patient Location: [id number]      Patient Type: V            ATTENDING PHYSICIAN: Valente David, MD                  ATTENDING PROVIDER:      Dr. Hulen Skains.            HISTORY OF PRESENT ILLNESS:      The patient is a 45 year old African American male with a past medical      history significant for depression who presented to the emergency      department after having a heated argument with his girlfriend.  He states      that he ingested a large amount of cocaine, heroin and alcohol an attempt      to "end it all."  After ingesting any combination of substances, the      patient developed chest pain, dizziness, and diaphoresis and called 911 for      assistance.  He subsequently developed nausea and vomiting which was      reported to be violent, according to the emergency department, and had a      small amount of hematemesis, which has not recurred during his emergency      department stay.  The patient was initially tachycardic into the 120s and      was agitated.  His heart rate has since dropped back to 86 beats per      minute, and his chest pain has resolved.  He described the chest pain as      substernal in nature, nonradiating, associated with shortness of breath and      diaphoresis.  He states that it lasted for approximately 10 minutes and      then abated spontaneously.  Upon arrival in the emergency department, his      recollection is that the chest pain had resolved in the ambulance.  He has      had no further chest pain since that time.  The patient's history is      inconsistent, likely secondary to acute drug and alcohol intoxication.            PAST MEDICAL HISTORY:      Depression.            PAST SURGICAL HISTORY:      None.            FAMILY HISTORY:      Notable for hypertension and high cholesterol.  He denies family history of      diabetes.            SOCIAL HISTORY:      The patient currently  works as an Event organiser.  He lives with his      girlfriend.  He does smoke but is unable to articulate any other history      regarding his tobacco abuse.  He does admit to drinking alcohol on a      regular basis.  He admits to cocaine and heroin use this evening.            ALLERGIES:      No known drug allergies.            OUTPATIENT MEDICATIONS:  None.            REVIEW OF SYSTEMS:      Not obtainable secondary to patient's sedate mental status.            PHYSICAL EXAMINATION:      VITAL SIGNS:  His heart rate is 87 beats per minute.  Monitor shows normal      sinus rhythm without ectopy.  He is afebrile.  His blood pressure is      currently 136/84.      HEENT:  His pupils are pinpoint and minimally reactive to light.  He is      unable to cooperate with assessment of extraocular motions.  His oropharynx      is clear.      NECK:  Supple, nontender, without evidence of JVD or thyromegaly.      HEART:  Regular rate and rhythm without evidence of murmurs, rubs or      gallops.      LUNGS:  Clear to auscultation, breath sounds are full and equal      bilaterally.      ABDOMEN:  Soft, nontender, nondistended with normoactive bowel sounds.      There is no evidence of hepatosplenomegaly, mass or bruits.      EXTREMITIES:  Without clubbing, cyanosis, or edema.  The distal pulses are      2+ and intact bilaterally.      NEUROLOGIC:  The patient is easily arouseable but drifts back off to sleep.       He is unable to stay focused on topic and answers questions      intermittently.  He does move all 4 extremities.  Further neurologic exam      is not possible secondary to patient's sedated state.            LABORATORY DATA:      Laboratory studies available for my review include a white count of 16.2      with a hemoglobin of 12.9, a platelet count of 213.  Sodium is 138 with a      potassium of 4.0, chloride of 103, bicarbonate of 13, BUN of 15, creatinine      of 1.5, glucose is 128.  Albumin is 4.3.  LFTs  are normal.  CPK is 363 with      a troponin of 0.00.  PT and INR are 13.2 and 1.1.  He is A positive.      Alcohol level is less than 10.  BNP is 40.  Urine toxicology screen is      pending at the time of this dictation.  Chest x-ray has been read by the      radiologist and shows no evidence of acute process.  EKG shows normal shows      sinus tachycardia with a rate of 114 beats per minute with normal intervals      and a normal axis.  There is no ST elevation, depression, or ST segment      changes suggestive of ischemia.            IMPRESSION:      1.  Substernal chest pain associated with shortness of breath and      diaphoresis.  No radiation.      2.  Cocaine, heroin and ETOH ingestion as likely cause of #1.      3.  Depression with admission of suicidal ideation with attempted overdose  overdoses medications as described above.      4.  Hematemesis.            PLAN:      1.  Admit to observation status, IMCU, telemetry, Dr. Zackery Barefoot service.      2.  Sitter at bedside until evaluated by psychiatry.  Dr. Thedore Mins, was      contacted at [phone] and will have the patient evaluated in the      morning.  Agree with aspirin and nitro paste.      3.  No beta blocker secondary to unopposed alpha effects with cocaine      ingestion.      4.  Agree with Protonix 40 mg IV daily.      5.  N.p.o. tonight, initiate clear liquids in the morning.      6.  IV hydration with normal saline 100 mL an hour with 1 amp MVI, 100 mg      of thiamine and 1 mg of folate to the first liter each day.      7.  Repeat laboratory studies in the morning.      8.  Ativan p.r.n. for withdrawal symptoms.      9.  Serax 50 mg p.o. q.6 h.      10.  Serial enzymes and EKGs to exclude myocardial infarction.      11.  We will add a Tylenol and acetaminophen level.            A total of 50 minutes was spent in direct patient contact.                        Electronic Signing Provider      _______________________________     Date/Time  Signed: _____________      Alesia Banda MD [phone])            D:  11/22/2008 23:25 PM by Dr. Angelique Blonder L. Claybon Jabs, MD [phone])      T:  11/23/2008 00:06 AM by [id number]          Everlean Cherry: [id number]) (Doc ID: [id number])                  [id number] Claybon Jabs MD

## 2010-12-15 NOTE — H&P (Signed)
[name] [name], [name] [name]      MRN:          [id number]      Account:      [id number]      Document ID:  [id number]                  Admit Date: 07/10/2009            Patient Location: [id number]      Patient Type: V            ATTENDING PHYSICIAN: Karrie Meres, MD                  CHIEF COMPLAINT:      Nausea and vomiting.            HISTORY OF PRESENT ILLNESS:      The patient is a 45 year old man who came to the [facility] emergency room because of having some nausea and vomiting.  The      patient said that he started having some nausea and vomiting yesterday      after he used heroin.  The patient said that he snorted about 1 bag of      heroin yesterday.  After that, he started having some abdominal discomfort      and also some nausea and vomiting.  Currently, his symptoms have improved.      He did not have any fever.  He did not have any chest pain.  The patient      said that he also used cocaine; last use was Jul 07, 2009.            PAST MEDICAL HISTORY:      None.            CURRENT MEDICATIONS:      None.            ALLERGIES:      No known drug allergies.            PAST SURGICAL HISTORY:      None.            SOCIAL HISTORY:      The patient lives with family.  Smokes 1 pack of cigarettes a day.  No      history of alcohol.  Uses heroin; last use was Jul 10, 2008.  Last cocaine      use was Jul 07, 2009.            FAMILY HISTORY:      Grandfather had history of unknown cancer.            REVIEW OF SYSTEMS:      Currently no headache, no chest pain, no shortness of breath, no abdominal      pain, no dysuria, no musculoskeletal pain as well.                                         Page 1 of 2      [name] [name], [name] [name]      MRN:          [id number]      Account:      [id number]      Document ID:  [id number] [phone]  PHYSICAL EXAMINATION:      GENERAL:  The patient is lying in bed, is slightly drowsy from medications,      but no distress, answers questions appropriately.      VITAL  SIGNS:  Blood pressure 101/59, pulse 90, temperature 98.8.      HEENT:  Pupils reactive to light bilaterally.      NECK:  No lymph nodes palpable.      LUNGS:  Bilateral air entry.  Clear to auscultation.      CARDIOVASCULAR:  S1, S2 and regular.      ABDOMEN:  Soft, nontender.  Positive bowel sounds.      EXTREMITIES:  No edema.            LABORATORY AND DIAGNOSTIC DATA:      The patient had laboratory tests done.  Laboratory tests show CPK of 123.      Troponin negative x3.  Urine for drug screen is positive for cocaine and      opiates.  EKG:  The patient's EKG was reviewed, which shows T-wave      inversions in lead III and AVF and also T-wave flattening in lead II.  The      patient did not have any new EKG changes from the EKG that was done on Jun 27, 2009.  The patient also had stress test recently back in September 2010,      which was negative.            ASSESSMENT AND PLAN:      1.  Drug abuse.  The patient had abdominal pain, nausea, and vomiting after      using cocaine and heroin.  Currently, his symptoms have resolved.  The      patient will get a CBC and CMP and if they are unremarkable, the patient      will be sent home today.  The patient is advised to avoid smoking as well      as drugs.      2.  EKG changes.  The patient does not have any new EKG changes.  EKG is      compared to the old EKG.  The patient recently had a stress test, which was      negative as well.  He does not have any cardiac symptoms.            DISPOSITION:      Patient to be discharged home if his blood tests are unremarkable.                                    D:  07/11/2009 11:03 AM by Dr. Debera Lat. Clayborn Heron, MD ([id number])      T:  07/11/2009 11:16 AM by [id number]                  cc:                                   Page 2 of 2      Authenticated by Cristela Felt, MD On 08/16/2009 08:52:27 AM

## 2010-12-15 NOTE — Discharge Summary (Signed)
[name] [name], [name] [name]      MRN:          [id number]      Account:      [id number]      Document ID:  [id number]                  Admit Date: 11/19/2009      Discharge Date: 11/21/2009            ATTENDING PHYSICIAN:  Neta Mends, MD                  ADMITTING DIAGNOSES:      1.  Nausea, vomiting, and intractable abdominal pain secondary to substance      intoxication versus withdrawal.      2.  Bilateral renal stone.      3.  History of substance abuse.            HOSPITAL COURSE:      This is a 45 year old gentleman with a past medical history of substance      abuse and a history of urinary tract infection, taking antibiotic, and      history of bilateral kidney stone, admitted on November 20, 2009, for      nausea, vomiting, abdominal pain, and unable to eat from the mouth.  The      patient was initially admitted to intermediate care unit and then as      patient's condition improving, patient was transferred.  During the stay in      the hospital, the patient's condition improving .  The patient was found to      have abnormal leukocytosis with WBCs about 20,000 and then no source of      infection at this time. On the stay in the hospital, the patient was done      duplex ultrasound of lower extremity, which showed no significant DVT.            Today, patient is eating well with food, no significant vomiting, no      significant pain, and no significant shortness of breath.  Also white cell      count came back to normal and no significant fever and nonsignificant      urinalysis too.  So patient will be discharged home today.            Plan is to get education for stop using drugs.  The patient will be      referred to detoxification program as an outpatient too.  Vital signs at      the time of the discharge, blood pressure 107/67, heart rate 66,      respiration rate 20, temperature 98.1, oxygen saturation 97%.            DISCHARGE DIAGNOSES:      1.  Status post nausea, vomiting, abdominal pain.      2.   History of substance abuse.            DISCHARGE MEDICATIONS:      Protonix 40 mg p.o. daily.            DISCHARGE INSTRUCTIONS:      The patient needs to follow up at [facility] in 1 to 2 weeks and      case manager will arrange for the detoxification program as an outpatient.  Page 1 of 2      GENNIE, DIB      MRN:          [id number]      Account:      [id number]      Document ID:  [id number]                              Electronic Signing Provider            D:  11/22/2009 12:36 PM by Dr. Marcelino Freestone, MD [phone])      T:  11/22/2009 13:21 PM by [id number]                        cc:                                   Page 2 of 2      Authenticated and Edited by Marcelino Freestone [phone]) On 12/01/09 3:54:45 PM

## 2010-12-15 NOTE — Consults (Signed)
Account Number: [id number]      Document ID: [id number]      Admit Date: 11/22/2008      Service Date: 11/23/2008            Patient Location: DISCHARGED 11/23/2008      Patient Type: V            CONSULTING PHYSICIAN: Eleny Cortez Lovie Macadamia MD            REFERRING PHYSICIAN:                  CONSULTING SERVICE:      Psychiatry.            HISTORY OF PRESENT ILLNESS:      This is a 45 year old single African American male who was admitted to the      hospital, brought into the emergency room by an ambulance stating patient      was intoxicated with crack cocaine, heroin, and alcohol.  At the time of      admission, patient was expressing suicidal thoughts and was brought into      the emergency room by an ambulance for his condition.  The patient      reportedly has an argument with his girlfriend and got angry and upset,      went and bought some material and ingestion himself with crack cocaine,      heroin, and alcohol.  A psychiatric consultation was initiated as patient      was expressed suicidal thought at the time of admission.  According to the      patient, has been sober for 4 or 5 years.  He had an argument with his      girlfriend, got upset, smoked crack cocaine and snorted heroin, and drank      some alcohol, got paranoid and started _____, agitated and subsequently was      brought into the emergency room for evaluation.  At the time of admission,      patient was noted to have racing heart.  Toxicology screen is positive for      cocaine and opioids.            PAST MEDICAL HISTORY:      No acute medical problems noted or reported.            CURRENT MEDICATIONS:      None known.            ALLERGIES:      None known.            PAST PSYCHIATRIC HISTORY:      The patient denies previous psychiatric hospitalizations or psychiatric      history.  However, patient has previous alcohol, polysubstance problems.      The patient says that he has been sober.            REVIEW OF SYSTEMS:      No contributors for  psychiatric history.  Only comorbid condition is      polysubstance dependency and alcohol dependency.            SOCIAL HISTORY:      The patient is single, lives with his girlfriend for 4 or 5 years, has been      sober for 4 years, works as a Optician, dispensing.  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  The patient has one child who is in college.  Patient has a legal history in the past.  The patient has been sober,      employed.            MENTAL STATUS EXAMINATION:      Well-developed African American male.  Speech is clear, cooperative, fair      eye contact.  Not in distress, ambulatory.  Cognitive and memory functions      seem to be intact.  Not suicidal, not homicidal.  Denies hearing voices,      denies suicidal or homicidal ideations, and contracts for safety.  Now      patient is not in distress.            DIAGNOSTIC FORMULATION:      This is a 45 year old single African American male with a long history of      polysubstance dependency, alcohol dependency, is sober for several years,      relapsed following an argument with his girlfriend.  The patient is alert,      oriented to person, place and time now, cooperative, contract for safety,      not suicidal, not homicidal, willing to continue to be sober.  Contracts      for safety.            DIAGNOSTIC IMPRESSION:      AXIS I:      1.  Substance-induced mood disorder.      2.  Polysubstance dependency.      3.  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      AXIS II:  Deferred, 799.90.      AXIS III:  None.      AXIS IV:  Moderate-to-severe.      AXIS V:  75 to 80.            PLAN AND RECOMMENDATION:      Education administrator.  The patient may be discharged home when medically      stable.  Outpatient followup, place management services recommended.                        Electronic Signing Provider      _______________________________     Date/Time Signed: _____________      Violeta Gelinas MD ([id number])             D:  11/23/2008 19:02 PM by Dr. Laury Axon. Elpidio Anis, MD ([id number])      T:  11/23/2008 19:38 PM by [id number]          Everlean Cherry: [id number]) (Doc ID: [id number])                  OA:[id number] Elpidio Anis MD

## 2010-12-16 ENCOUNTER — Emergency Department
Admit: 2010-12-16 | Disposition: A | Discharge status: Home / Self Care | Payer: Self-pay | Source: Emergency Department | Admitting: Emergency Medicine

## 2010-12-16 LAB — CBC AND DIFFERENTIAL
Basophils Absolute Automated: 0.04 10*3/uL (ref 0.00–0.20)
Basophils Automated: 0 % (ref 0–2)
Eosinophils Absolute Automated: 0.03 10*3/uL (ref 0.00–0.70)
Eosinophils Automated: 0 % (ref 0–5)
Hematocrit: 38.3 % — ABNORMAL LOW (ref 42.0–52.0)
Hgb: 13.6 g/dL (ref 13.0–17.0)
Immature Granulocytes Absolute: 0.03 10*3/uL
Immature Granulocytes: 0 % (ref 0–1)
Lymphocytes Absolute Automated: 1.77 10*3/uL (ref 0.50–4.40)
Lymphocytes Automated: 22 % (ref 15–41)
MCH: 31.1 pg (ref 28.0–32.0)
MCHC: 35.5 g/dL (ref 32.0–36.0)
MCV: 87.4 fL (ref 80.0–100.0)
MPV: 10.9 fL (ref 9.4–12.3)
Monocytes Absolute Automated: 0.79 10*3/uL (ref 0.00–1.20)
Monocytes: 10 % (ref 0–11)
Neutrophils Absolute: 5.46 10*3/uL (ref 1.80–8.10)
Neutrophils: 67 % (ref 52–75)
Nucleated RBC: 0 /100 WBC
Platelets: 219 10*3/uL (ref 140–400)
RBC: 4.38 10*6/uL — ABNORMAL LOW (ref 4.70–6.00)
RDW: 12 % (ref 12–15)
WBC: 8.09 10*3/uL (ref 3.50–10.80)

## 2010-12-16 LAB — COMPREHENSIVE METABOLIC PANEL
ALT: 13 U/L (ref 0–55)
AST (SGOT): 13 U/L (ref 5–34)
Albumin/Globulin Ratio: 1.2 (ref 0.9–2.2)
Albumin: 3.9 g/dL (ref 3.5–5.0)
Alkaline Phosphatase: 61 U/L (ref 40–150)
Anion Gap: 8 (ref 5.0–15.0)
BUN: 8 mg/dL — ABNORMAL LOW (ref 9.0–21.0)
Bilirubin, Total: 1.6 mg/dL — ABNORMAL HIGH (ref 0.2–1.2)
CO2: 29 mEq/L (ref 22–29)
Calcium: 9.5 mg/dL (ref 8.5–10.5)
Chloride: 101 mEq/L (ref 98–107)
Creatinine: 1 mg/dL (ref 0.7–1.3)
Globulin: 3.2 g/dL (ref 2.0–3.6)
Glucose: 105 mg/dL — ABNORMAL HIGH (ref 70–100)
Potassium: 3.9 mEq/L (ref 3.5–5.1)
Protein, Total: 7.1 g/dL (ref 6.0–8.3)
Sodium: 138 mEq/L (ref 136–145)

## 2010-12-16 LAB — RAPID DRUG SCREEN, URINE
Barbiturate Screen, UR: NEGATIVE
Benzodiazepine Screen, UR: POSITIVE — AB
Cannabinoid Screen, UR: NEGATIVE
Cocaine, UR: NEGATIVE
Opiate Screen, UR: POSITIVE — AB
PCP Screen, UR: NEGATIVE
Urine Amphetamine Screen: NEGATIVE

## 2010-12-16 LAB — ACETAMINOPHEN LEVEL: Acetaminophen Level: <3 ug/mL — ABNORMAL LOW (ref 10–30)

## 2010-12-16 LAB — GFR: EGFR: >60

## 2010-12-16 LAB — HEMOLYSIS INDEX: Hemolysis Index: 10 Index (ref 0–18)

## 2010-12-16 LAB — ETHANOL: Alcohol: NOT DETECTED mg/dL

## 2010-12-16 LAB — SALICYLATE LEVEL: Salicylate Level: <5 mg/dL — ABNORMAL LOW (ref 15.0–30.0)

## 2010-12-16 LAB — LIPASE: Lipase: 64 U/L (ref 8–78)

## 2010-12-28 LAB — ECG 12-LEAD
Atrial Rate: 46 {beats}/min
P Axis: 19 degrees
P-R Interval: 128 ms
Q-T Interval: 466 ms
QRS Duration: 92 ms
QTC Calculation (Bezet): 407 ms
R Axis: -21 degrees
T Axis: -9 degrees
Ventricular Rate: 46 {beats}/min

## 2011-01-13 NOTE — H&P (Signed)
[name] [name], [name] [name]      MRN:          [id number]      Account:      [id number]      Document ID:  [id number]                  Admit Date: 09/26/2010            Patient Location: [id number]      Patient Type: I            ATTENDING PHYSICIAN: Alisia Ferrari, MD                  HISTORY OF PRESENT ILLNESS:      This is a 45 year old male patient with a past medical history significant      for substance abuse, presented to the emergency room with a chief complaint      of abdominal pain, nause,  vomiting and diarrhea. He was having pain and was      not coopearting to get information about cause of his admission.  He said he      had heroin on Friday and cocaine on Sunday. He started to have abdominal pain      since yesterday, which was in the epigastric region and right hip region, a 10      by 10 in intensity, not radiating. It is severe and he also had nausea and      vomiting and so he came to the emergency room for further management and he was      admitted.  He was evaluated in the emergency room and he was admitted to the      medical floor with the diagnosis of opiate withdrawal, intractable pain, and      intractable vomiting.            PAST MEDICAL HISTORY:      As mentioned, substance abuse.  From the ER note it says that the patient also      has tobacco abuse, kidney stones, previous history of depression.            ALLERGIES:      No kind of drug allergies.            MEDICATIONS:      None, according to the emergency room note.            FAMILY HISTORY:      Unobtainable.            SOCIAL HISTORY:      He smokes 1-1/2 pack per day.  He does heroin and cocaine.            PHYSICAL EXAMINATION:      VITAL SIGNS:  Temperature of 98.2, pulse rate 59, respirations 20, and      blood pressure 104/57, saturating 100% on room air.      GENERAL:  He is alert and oriented x3.      HEENT:  Obese, well-built African American male patient lying in distress      because of his abdominal pain and nausea, and  feeling feverish and chills.      HEENT:  I could not do the complete examination.      LUNGS:  Clear to auscultation bilaterally with good air entry.  Page 1 of 2      GRAYSEN, [name]      MRN:          [id number]      Account:      [id number]      Document ID:  [id number]                  HEART:  S1, S2 heard.  Bradycardia.      ABDOMEN:  Bowel sounds normal, no tenderness in the epigastric region.      Again, not able to complete the examination.      EXTREMITIES:  No pedal edema, 2+ pedal pulses.            LABORATORY DATA:      WBC is 2.8.  Hemoglobin is 13.9 and hematocrit 39.7, platelets of 207.      Sodium 142, potassium 3.9, chloride 104, bicarbonate 28, BUN 15, creatinine      0.9, glucose 95, calcium 9.9, total protein 8.1, albumin 4.9,  total      bilirubin 1.3, alkaline phosphatase 81, ALT 18, AST 26, lipase      117.  Urine tox was positive cocaine and opiates.  PT 13.2, INR 1, PTT 31.      UA within normal limits, showed 300 ketones and 114 mild proteinuria.            RADIOLOGICAL STUDIES:      None.            ASSESSMENT AND PLAN:      1. Abdominal pain, vominting probably secondary to withdrawal of substance      abuse Vs gasteroenteritis. Would also start the patient on intravenous fluids.      2. Substance abuse: The patient has already received 1 dose of methadone and      presently patient is having bradycardia so I contacted the Pharmacy and they      said we can give 1 more dose even though the patient is bradycardic.  So I did      order methadone 10 mg one dose and also had to put the patient on telemetry and      to follow his vitals closely. He admits that he has nausea. We will give      Phenergan.      3. Bradycardia. To continue to monitor closely secondary to above with      things as mentioned Vs baseline.      4. Tobacco abuse. Would educate the patient when he is stabilized.      5. Leukocytosis, probably reactive. Would monitor for now.  The rest  of the      management will depend upon the hospital course.                              D:  09/27/2010 12:46 PM by Dr. Neysa Bonito Yul Diana, MD ([id number])      T:  09/27/2010 14:00 PM by [id number]                  cc:                                   Page 2 of 2      Authenticated and Edited by Alisia Ferrari, MD ([id number]) On 10/08/10 11:19:22 PM

## 2011-01-13 NOTE — Discharge Summary (Signed)
[name] [name], [name] [name]      MRN:          [id number]      Account:      [id number]      Document ID:  [id number]                  Admit Date: 09/26/2010      Discharge Date: 09/30/2010            ATTENDING PHYSICIAN:  Neysa Bonito Jillisa Harris, MD                  HOSPITAL COURSE:      This is a 45 year old African American male patient with a past medical      history significant for substance abuse, heroin and cocaine and alcohol      abuse and tobacco abuse, who presented with a chief complaint of nausea,      vomiting, abdominal pain. The admitting diagnosis was opiate withdrawal,      intractable pain, and intractable vomiting Vs gateroenteritis.  He was admitted      to the medical floor.  By the time I went to see the patient, he was in severe      pain and he was not giving much information except for he has been having      abdominal pain.  The patient received methadone in the emergency room.  Though      he was bradycardic, I did call the pharmacist and discussed with them, and they      said that another dose of methadone can be given, and this was discussed      because of his issue of bradycardia.  The patient was found to be      bradycardic, rapid response team was being called. Abdominal x-ray was done.      EKG was done, which was fine, and the patient was transferred to the telemetry      unit. During his stay in the telemetry unit, his heart rate was stable and on      comparison with his past admission as well, his heart rate was obtained in the      50s and 60s, so that could be his baseline. TSH and T4 were being ordered,      which was again within normal limits to check the cause of his bradycardia.      His abdominal pain, it was thought to be secondary to gastroenteritis versus      secondary to his substance abuse.  He is gradually improved and he was also      tolerating food, so he was discharged on September 30, 2010, with advice to follow      up with the [facility] in 1 to 2 weeks.             DISCHARGE DIAGNOSES:      1.  Abdominal pain, probably secondary to gastroenteritis versus substance      abuse.      2.  Heroin and cocaine abuse.      3.  Leukocytosis, which was reactive.      4.  Bradycardia.      5.  History of depression.            DISCHARGE MEDICATIONS:      Please see the medication reconciliation form.  Page 1 of 2      [name] [name], [name] [name]      MRN:          [id number]      Account:      [id number]      Document ID:  [id number]                  D:  10/06/2010 15:41 PM by Dr. Neysa Bonito Malacki Mcphearson, MD ([id number])      T:  10/07/2010 13:58 PM by [id number]                        cc:                                   Page 2 of 2      Authenticated and Edited by Alisia Ferrari, MD ([id number]) On 10/08/10 11:21:34 PM

## 2011-09-10 ENCOUNTER — Observation Stay
Admission: EM | Admit: 2011-09-10 | Discharge: 2011-09-11 | Disposition: A | Discharge status: Home / Self Care | Payer: Medicaid Other | Attending: Internal Medicine | Admitting: Internal Medicine

## 2011-09-10 ENCOUNTER — Observation Stay: Payer: Medicaid Other | Admitting: Internal Medicine

## 2011-09-10 ENCOUNTER — Encounter: Payer: Self-pay | Admitting: Physician Assistant

## 2011-09-10 ENCOUNTER — Observation Stay: Payer: Medicaid Other

## 2011-09-10 ENCOUNTER — Emergency Department: Payer: Medicaid Other

## 2011-09-10 DIAGNOSIS — F1410 Cocaine abuse, uncomplicated: Secondary | ICD-10-CM | POA: Insufficient documentation

## 2011-09-10 DIAGNOSIS — R079 Chest pain, unspecified: Secondary | ICD-10-CM | POA: Diagnosis present

## 2011-09-10 DIAGNOSIS — M7989 Other specified soft tissue disorders: Secondary | ICD-10-CM | POA: Insufficient documentation

## 2011-09-10 DIAGNOSIS — K920 Hematemesis: Secondary | ICD-10-CM | POA: Insufficient documentation

## 2011-09-10 DIAGNOSIS — R9431 Abnormal electrocardiogram [ECG] [EKG]: Secondary | ICD-10-CM | POA: Insufficient documentation

## 2011-09-10 DIAGNOSIS — R0789 Other chest pain: Principal | ICD-10-CM | POA: Insufficient documentation

## 2011-09-10 DIAGNOSIS — R0989 Other specified symptoms and signs involving the circulatory and respiratory systems: Secondary | ICD-10-CM | POA: Insufficient documentation

## 2011-09-10 DIAGNOSIS — F17200 Nicotine dependence, unspecified, uncomplicated: Secondary | ICD-10-CM | POA: Insufficient documentation

## 2011-09-10 DIAGNOSIS — R0609 Other forms of dyspnea: Secondary | ICD-10-CM | POA: Insufficient documentation

## 2011-09-10 DIAGNOSIS — R0602 Shortness of breath: Secondary | ICD-10-CM | POA: Insufficient documentation

## 2011-09-10 DIAGNOSIS — F1020 Alcohol dependence, uncomplicated: Secondary | ICD-10-CM | POA: Insufficient documentation

## 2011-09-10 HISTORY — DX: Cardiac murmur, unspecified: R01.1

## 2011-09-10 LAB — COMPREHENSIVE METABOLIC PANEL
ALT: 13 U/L (ref 0–55)
AST (SGOT): 21 U/L (ref 5–34)
Albumin/Globulin Ratio: 1.2 (ref 0.9–2.2)
Albumin: 3.6 g/dL (ref 3.5–5.0)
Alkaline Phosphatase: 65 U/L (ref 40–150)
Anion Gap: 9 (ref 5.0–15.0)
BUN: 12 mg/dL (ref 9.0–21.0)
Bilirubin, Total: 0.5 mg/dL (ref 0.2–1.2)
CO2: 25 mEq/L (ref 22–29)
Calcium: 8.6 mg/dL (ref 8.5–10.5)
Chloride: 103 mEq/L (ref 98–107)
Creatinine: 0.9 mg/dL (ref 0.7–1.3)
Globulin: 3 g/dL (ref 2.0–3.6)
Glucose: 114 mg/dL — ABNORMAL HIGH (ref 70–100)
Potassium: 4 mEq/L (ref 3.5–5.1)
Protein, Total: 6.6 g/dL (ref 6.0–8.3)
Sodium: 137 mEq/L (ref 136–145)

## 2011-09-10 LAB — CBC AND DIFFERENTIAL
Basophils Absolute Automated: 0.02 10*3/uL (ref 0.00–0.20)
Basophils Automated: 0 % (ref 0–2)
Eosinophils Absolute Automated: 0.09 10*3/uL (ref 0.00–0.70)
Eosinophils Automated: 1 % (ref 0–5)
Hematocrit: 34.7 % — ABNORMAL LOW (ref 42.0–52.0)
Hgb: 12.1 g/dL — ABNORMAL LOW (ref 13.0–17.0)
Immature Granulocytes Absolute: 0.03 10*3/uL
Immature Granulocytes: 0 % (ref 0–1)
Lymphocytes Absolute Automated: 1.52 10*3/uL (ref 0.50–4.40)
Lymphocytes Automated: 15 % (ref 15–41)
MCH: 31.3 pg (ref 28.0–32.0)
MCHC: 34.9 g/dL (ref 32.0–36.0)
MCV: 89.9 fL (ref 80.0–100.0)
MPV: 10.6 fL (ref 9.4–12.3)
Monocytes Absolute Automated: 0.66 10*3/uL (ref 0.00–1.20)
Monocytes: 7 % (ref 0–11)
Neutrophils Absolute: 7.67 10*3/uL (ref 1.80–8.10)
Neutrophils: 77 % — ABNORMAL HIGH (ref 52–75)
Nucleated RBC: 0 /100 WBC (ref 0–1)
Platelets: 197 10*3/uL (ref 140–400)
RBC: 3.86 10*6/uL — ABNORMAL LOW (ref 4.70–6.00)
RDW: 13 % (ref 12–15)
WBC: 9.96 10*3/uL (ref 3.50–10.80)

## 2011-09-10 LAB — RAPID DRUG SCREEN, URINE
Barbiturate Screen, UR: NEGATIVE
Benzodiazepine Screen, UR: NEGATIVE
Cannabinoid Screen, UR: NEGATIVE
Cocaine, UR: POSITIVE
Opiate Screen, UR: POSITIVE
PCP Screen, UR: NEGATIVE
Urine Amphetamine Screen: NEGATIVE

## 2011-09-10 LAB — TROPONIN I
Troponin I: 0.01 ng/mL (ref 0.00–0.09)
Troponin I: <0.01 ng/mL (ref 0.00–0.09)
Troponin I: <0.01 ng/mL (ref 0.00–0.09)

## 2011-09-10 LAB — HEMOLYSIS INDEX: Hemolysis Index: 9 Index (ref 0–18)

## 2011-09-10 LAB — ECG 12-LEAD
Atrial Rate: 112 {beats}/min
P Axis: 58 degrees
P-R Interval: 158 ms
Q-T Interval: 322 ms
QRS Duration: 90 ms
QTC Calculation (Bezet): 439 ms
R Axis: -25 degrees
T Axis: 66 degrees
Ventricular Rate: 112 {beats}/min

## 2011-09-10 LAB — IHS D-DIMER: D-Dimer: 0.41 ug/mL FEU (ref 0.00–0.51)

## 2011-09-10 LAB — GFR: EGFR: >60

## 2011-09-10 MED ORDER — SODIUM CHLORIDE 0.9 % IV SOLN
INTRAVENOUS | Status: DC
Start: 2011-09-10 — End: 2011-09-11

## 2011-09-10 MED ORDER — ENOXAPARIN SODIUM 40 MG/0.4ML SC SOLN
40.00 mg | SUBCUTANEOUS | Status: AC
Start: 2011-09-10 — End: 2011-09-10
  Administered 2011-09-10: 40 mg via SUBCUTANEOUS
  Filled 2011-09-10: qty 0.4

## 2011-09-10 MED ORDER — SODIUM CHLORIDE 0.9 % IV SOLN
INTRAVENOUS | Status: DC
Start: 2011-09-11 — End: 2011-09-10

## 2011-09-10 MED ORDER — NITROGLYCERIN 2 % TD OINT
0.50 [in_us] | TOPICAL_OINTMENT | Freq: Once | TRANSDERMAL | Status: AC
Start: 2011-09-10 — End: 2011-09-10
  Administered 2011-09-10: 0.5 [in_us] via TOPICAL
  Filled 2011-09-10: qty 1

## 2011-09-10 MED ORDER — NITROGLYCERIN 0.4 MG SL SUBL
0.4000 mg | SUBLINGUAL_TABLET | SUBLINGUAL | Status: AC
Start: 2011-09-10 — End: 2011-09-10
  Administered 2011-09-10 (×3): 0.4 mg via SUBLINGUAL
  Filled 2011-09-10: qty 1

## 2011-09-10 MED ORDER — NITROGLYCERIN 2 % TD OINT
1.00 [in_us] | TOPICAL_OINTMENT | TRANSDERMAL | Status: DC
Start: 2011-09-10 — End: 2011-09-11
  Administered 2011-09-10 (×3): 1 [in_us] via TOPICAL
  Filled 2011-09-10 (×3): qty 1

## 2011-09-10 MED ORDER — HYDROMORPHONE HCL PF 1 MG/ML IJ SOLN
1.00 mg | Freq: Once | INTRAMUSCULAR | Status: AC
Start: 2011-09-10 — End: 2011-09-10
  Administered 2011-09-10: 1 mg via INTRAVENOUS
  Filled 2011-09-10: qty 1

## 2011-09-10 MED ORDER — LORAZEPAM 2 MG/ML IJ SOLN
1.00 mg | Freq: Once | INTRAMUSCULAR | Status: AC
Start: 2011-09-10 — End: 2011-09-10
  Administered 2011-09-10: 1 mg via INTRAVENOUS
  Filled 2011-09-10: qty 1

## 2011-09-10 MED ORDER — ASPIRIN 81 MG PO CHEW
324.00 mg | CHEWABLE_TABLET | Freq: Once | ORAL | Status: AC
Start: 2011-09-10 — End: 2011-09-10
  Administered 2011-09-10: 324 mg via ORAL
  Filled 2011-09-10: qty 4

## 2011-09-10 MED ORDER — ONDANSETRON HCL 4 MG/2ML IJ SOLN
4.00 mg | Freq: Once | INTRAMUSCULAR | Status: AC
Start: 2011-09-10 — End: 2011-09-10
  Administered 2011-09-10: 4 mg via INTRAVENOUS
  Filled 2011-09-10: qty 2

## 2011-09-10 MED ORDER — NITROGLYCERIN 0.4 MG SL SUBL
0.4000 mg | SUBLINGUAL_TABLET | SUBLINGUAL | Status: DC | PRN
Start: 2011-09-10 — End: 2011-09-11

## 2011-09-10 MED ORDER — LORAZEPAM 2 MG/ML IJ SOLN
1.00 mg | Freq: Once | INTRAMUSCULAR | Status: DC
Start: 2011-09-10 — End: 2011-09-11

## 2011-09-10 MED ORDER — MORPHINE SULFATE 2 MG/ML IJ/IV SOLN (WRAP)
2.00 mg | INTRAVENOUS | Status: DC | PRN
Start: 2011-09-10 — End: 2011-09-11
  Administered 2011-09-11: 2 mg via INTRAVENOUS
  Filled 2011-09-10: qty 1

## 2011-09-10 MED ORDER — LORAZEPAM 2 MG/ML IJ SOLN
2.00 mg | INTRAMUSCULAR | Status: DC | PRN
Start: 2011-09-10 — End: 2011-09-11

## 2011-09-10 MED ORDER — ONDANSETRON HCL 4 MG/2ML IJ SOLN
4.00 mg | Freq: Four times a day (QID) | INTRAMUSCULAR | Status: DC | PRN
Start: 2011-09-10 — End: 2011-09-11

## 2011-09-10 MED ORDER — ASPIRIN 81 MG PO CHEW
324.00 mg | CHEWABLE_TABLET | Freq: Every day | ORAL | Status: DC
Start: 2011-09-10 — End: 2011-09-11
  Administered 2011-09-10: 324 mg via ORAL
  Filled 2011-09-10: qty 4

## 2011-09-10 MED ORDER — SODIUM CHLORIDE 0.9 % IV BOLUS
1000.00 mL | Freq: Once | INTRAVENOUS | Status: AC
Start: 2011-09-10 — End: 2011-09-10
  Administered 2011-09-10: 1000 mL via INTRAVENOUS

## 2011-09-10 NOTE — Plan of Care (Signed)
Patient A&Ox3, VS WNL. SR on telemetry. Denies chest pain or SOB.  On CIWA protocol score of 1. No tremors or anxiety. Patient has orders for ECO and stress test on 7/16. Will be NPO pat MN, patient aware of procedures. Will continue to monitor VS, tele, labs, CIWA scores.

## 2011-09-10 NOTE — Consults (Signed)
Service Date: 09/10/2011     Patient Type:      CONSULTING PHYSICIAN: Sheela Stack MD     REFERRING PHYSICIAN: Edwena Blow MD     REASON FOR CONSULTATION:  Verbal request from Dr. Karie Mainland to assist with management in patient with chest  pain.     HISTORY OF PRESENT ILLNESS:  The patient is a 46 year old male who was well until the evening of  admission, when after drinking Cognac and imbibing cocaine, developed  nausea, recurrent vomiting, which had a small amount of blood.  He then  became panicky developed severe 8/10 pain in the left chest, which radiated  to the left shoulder and scapula.  He had shortness of breath and  diaphoresis.  He denied any fever, cough, hemoptysis, or melena.  No  history of dysuria, pyuria, hematuria.     PAST MEDICAL HISTORY:  Not significant.     FAMILY HISTORY:  Noncontributory.     PAST SURGICAL HISTORY:  None.     SOCIAL HISTORY:  He does admit to smoking 1 to 2 packs of cigarettes per day and obviously  drinks alcohol.     FAMILY HISTORY:  No premature atherosclerosis.     REVIEW OF SYSTEMS:  CONSTITUTIONAL:  No fever or chills.  RESPIRATORY:  Positive dyspnea.  No cough.  GASTROINTESTINAL:  Positive nausea and vomiting.  GENITOURINARY:  No hematuria or dysuria.  MUSCULOSKELETAL:  No arthralgia, myalgia.  HEME/LYMPH:  No history of anemia, lymphadenopathy.  ENT:  No change in hearing.  No epistaxis.  All other is negative.     PHYSICAL EXAMINATION:  GENERAL:  The patient is alert, cooperative, middle-aged [place] American  male in no distress at rest.    VITAL SIGNS:  Temperature 99.1, heart rate 65, respirations 18 and  unlabored, blood pressure 115/60, oxygen saturation 100% on room air.  HEENT:  Atraumatic, normocephalic:  Face symmetric.  Speech fluent.   Sclerae nonicteric.  NECK:  No JVD, no carotid bruits, no thyromegaly, no adenopathy.  LUNGS:  Clear posteriorly.  No rhonchi, wheezing, or rales.  HEART:  Normal S1, normal S2, no murmur, gallop, or rub.  ABDOMEN:  Soft,  nontender, bowel sounds normoactive.  No  hepatosplenomegaly.  EXTREMITIES:  No edema, atrophy, clubbing, or cyanosis.  SKIN:  Warm, dry, and intact with no rash.     DIAGNOSTIC DATA:  Chest x-ray:  No active disease.  EKG:  Sinus rhythm.  Cannot rule out  septal infarct.     LABORATORY DATA:  Initial white count 10.0, hematocrit 34.7, platelets 197, BUN 12,  creatinine 0.9.  Troponin initial 0.01.  LFTs unremarkable.     IMPRESSION:  1.  Atypical chest pain in the setting of alcohol/drug use with nausea and  vomiting.  2.  Abnormal EKG suggesting anteroseptal infarct.     RECOMMENDATION:  1.  Echocardiogram.  2.  Continue serial enzymes, EKGs.  Will perform stress ECG on July 16, if  patient remains in the hospital and is asymptomatic.     Thank you for this consultation.  We will follow closely with you in the  hospital.           D:  09/10/2011 15:14 PM by Dr. Greggory Stallion A. Donley Redder, MD [phone])  T:  09/10/2011 16:23 PM by [id number]      Everlean Cherry: [id number]) (Doc ID: [id number])

## 2011-09-10 NOTE — Progress Notes (Signed)
PROGRESS NOTE  Pilar Plate  [phone]  Date Time: 09/10/2011 1:31 PM  Patient Name: [name] [name], [name] [name]      Assessment:   1)Chest pain, rule out acute coronary syndrome with apparent history of   chest pain syndrome.   2. Cocaine abuse.   3. Alcohol addiction.      Plan:   1) Echo as recommended by cardiology  2) Labs including enzymes  3) Seizure precaution  4) VTE and GI prophylaxis  5) Continue current treatment paln  6) ? Stress test in am      Subjective:   Feeling better, mild nausea, pain control with current treatment    Medications:     Current Facility-Administered Medications   Medication Dose Route Frequency   . aspirin  324 mg Oral Once   . aspirin  324 mg Oral QD after dinner   . enoxaparin  40 mg Subcutaneous Q24H SCH   . HYDROmorphone  1 mg Intravenous Once   . LORazepam  1 mg Intravenous Once   . LORazepam  1 mg Intravenous Once   . nitroglycerin  0.5 inch Topical Once   . nitroglycerin  1 inch Topical Q6H HOLD MN   . nitroglycerin  0.4 mg Sublingual Q5 Min   . ondansetron  4 mg Intravenous Once   . sodium chloride  1,000 mL Intravenous Once       Review of Systems:    General ROS: negative, no weight loss/gain, no fever, no chills, no rigor   ENT ROS: negative   Endocrine ROS: negative, no fatigue, no polydipsia   Respiratory ROS: no cough, shortness of breath, or wheezing   Cardiovascular ROS: no chest pain or dyspnea on exertion   Gastrointestinal ROS: no abdominal pain, change in bowel habits, or black or bloody stools   Genito-Urinary ROS: no dysuria, trouble voiding, or hematuria   Musculoskeletal ROS: negative   Neurological ROS: no TIA or stroke symptoms, no encephalopathy   Dermatological ROS: negative, no rash, no ulcer    Physical Exam:     Filed Vitals:    09/10/11 1121   BP: 102/53   Pulse: 54   Temp: 98.8 F (37.1 C)   Resp: 20   SpO2: 97%       Intake and Output Summary (Last 24 hours) at Date Time  No intake or output data in the 24 hours ending 09/10/11 1331    General  appearance - alert, well appearing, and in no distress and oriented to person, place, and time  Mental status - alert, oriented to person, place, and time  Eyes - pupils equal and reactive, extraocular eye movements intact  Nose - normal and patent, no erythema, discharge or polyps  Mouth - mucous membranes moist, pharynx normal without lesions  Neck - Supple, no JVD, no thyromegaly  Lymphatics - no palpable lymphadenopathy  Chest - clear to auscultation, no wheezes, rales or rhonchi, symmetric air entry  Heart - normal rate, regular rhythm, normal S1, S2, right chest and shoulder pain  Abdomen - soft, nontender, nondistended, no masses or organomegaly, bowel sounds normal  Neurological - alert, oriented, normal speech, no focal findings or movement disorder noted  Extremities - peripheral pulses normal, trace edema BLE, no clubbing or cyanosis  Skin - normal coloration and turgor, no rashes, no suspicious skin lesions noted    Labs:     Lab 09/10/11 0157   WBC 9.96   HGB 12.1*   HCT 34.7*   PLT  197       Lab 09/10/11 0240   NA 137   K 4.0   CL 103   CO2 25   BUN 12.0   CREAT 0.9   EGFR >60.0   GLU 114*   CA 8.6       Rads:   Radiological Procedure reviewed.    Signed by: Pilar Plate

## 2011-09-10 NOTE — H&P (Signed)
Patient Type: V     ATTENDING PHYSICIAN: Valente David, MD     PRESENTING COMPLAINT:  Chest pain for 1 hour.       HISTORY OF PRESENT ILLNESS:  The patient is a 46 year old African American male who presented to the  emergency room at [facility] with a complaint of 8 out of 10  retrosternal and left sided chest pain radiating to the left shoulder which  started 1 hour before coming to the emergency room.  It was associated with  some shortness of breath, diaphoresis, nausea and vomiting.  The patient  gives a history of drinking alcohol and snorting cocaine.  He also gives a  history of some degree of swelling of his legs.  He denies any fever,  cough, hemoptysis, hematemesis or melena.  No history of dysuria, pyuria or  hematuria.       PAST MEDICAL HISTORY:  Significant for a heart murmur.     FAMILY HISTORY:  Noncontributory.       PAST SURGICAL HISTORY:  Noncontributory.     PERSONAL HISTORY:  The patient is sedated and not very oriented.  He states that he is  jobless.       REVIEW OF SYSTEMS:  GENERAL:  Significant for a healthy look.  CARDIOVASCULAR:  Significant for chest pain.  The rest of the systems reviewed were not accurate because sedation seems  to be unremarkable.       PHYSICAL EXAMINATION:  VITAL SIGNS:  On initial evaluation temperature 99.1, heart rate 65 per  minute, respiratory rate 16, blood pressure 115/67, oxygen saturation 100%  on room air.    HEENT:  Head normocephalic atraumatic.    NECK:  Supple, no thyromegaly, no lymphadenopathy.  CHEST:  Fascicular breathing, no added sounds.  HEART:  S1 and S2, regular rate and rhythm, no gallop or murmur.  ABDOMEN:  Soft and nontender, no organomegaly, normally active bowel  sounds.  CENTRAL NERVOUS SYSTEM:  Alert and oriented x3, no neurologic deficits.  EXTREMITIES:  Pedal edema, absent peripheral pulses, _____.  SKIN:  Warm, dry and intact.       LABORATORY AND DIAGNOSTIC DATA:  WBC 9.96, H and H 12.1 and 34.7, platelet count  197.  Chemistries:  Glucose  114, BUN 12, creatinine 0.9, sodium 137, potassium _____, chloride 103,  carbon dioxide 25, calcium 8.5, AST 20, ALT 13, alkaline phosphatase 65.   The first set of troponins was less than 0.01.  D-Dimer was 0.41.       IMAGING STUDIES:  Chest x-ray was ordered.  EKG revealed normal sinus rhythm, no ST changes.         IMPRESSION:  1.  Chest pain, rule out acute coronary syndrome with apparent history of  chest pain syndrome.    2.  Cocaine abuse.  3.  Alcohol addiction.     PLAN OF CARE:  1.   Admit to telemetry unit.  2.   Activity:  Bed rest.  3.   Diet:  Cardiac.  4.   Vitals:  As per unit protocol.  5.   Oxygen 2 liters per minute via nasal cannula to be titrated to more  than 92%.  6.   DVT prophylaxis with enoxaparin 40 mg subcutaneously daily.  7.   Peptic ulcer disease prophylaxis with pantoprazole 40 mg IV daily.  8.   Pain control with morphine 2 mg IV every 4 hours p.r.n. nausea and  vomiting.  9.   Zofran  4 mg IV every 6 hours p.r.n.  10. Nitroglycerin 0.4 mg sublingual every 5 minutes x3 for chest pain to be  followed by a call to the MD.  11. Nitro paste 2% 1 inch to anterior chest wall every 6 hours, to be held  from midnight to 6:00 a.m.  12. Aspirin 325 mg p.o. daily.  13. Sodium chloride 100 mL an hour intravenously continuously.  14. Ativan 2 mg IV every 4 hours p.r.n. anxiety and agitation.  15. Laboratory data:  CBC and BMP with magnesium, PT and INR with the next  morning labs.    16. Cardiac enzymes every 6 hours x3.  17. Cardiology consultation with cardiologist on-call.     Thank you very much for allowing me to participate in the care of this  patient.           D:  09/10/2011 04:36 AM by Dr. Jerolyn Center A. Karie Mainland, MD ([id number])  T:  09/10/2011 07:01 AM by Justice Deeds      Everlean Cherry: [id number]) (Doc ID: [id number])

## 2011-09-10 NOTE — Progress Notes (Signed)
The patient was seen and examined at the request of Dr. Karie Mainland.    Impression Atypical chest pain in the setting of alcohol and  drug use with vomiting.                     Abnormal ECG suggesting ASMI.   Recommend: Echocardiogram                          Continue serial enzymes ECGs.                          Stress ECG 07/16 if patient remains in the hospital.    Thank you will follow with you.

## 2011-09-10 NOTE — ED Notes (Signed)
Pt reports CP 1hr PTA. Pt admits to drinking 1 shot of ETOH and snorting cocaine. Pt reports pain as sharp, constant and radiates down left and right shoulders. Pt reports to feeling short of breath and reports seeing blood in his vomit.

## 2011-09-10 NOTE — ED Provider Notes (Signed)
EMERGENCY DEPARTMENT HISTORY AND PHYSICAL EXAM    Date: 09/10/2011  Patient Name: [name] [name]  Attending Physician Assistant:  Henry Russel, P.A.-C    History of Presenting Illness     Chief Complaint   Patient presents with   . Chest Pain       History Provided By: Patient    Chief Complaint: Chest pain  Onset: 1 hour prior to arrival  Location: Chest, radiating to left shoulder  Quality: Lambert Mody  Severity: Moderate    Additional History: [name] [name] is a 46 y.o. male. He complains of one hour of sharp chest pain radiating to his left shoulder. He also reports shortness of breath. He admits to drinking alcohol and snorting cocaine tonight. He states both of his legs are swollen, and he reports diaphoresis, nausea, and vomiting.    PCP: No primary provider on file.      Current Facility-Administered Medications   Medication Dose Route Frequency Provider Last Rate Last Dose   . aspirin chewable tablet 324 mg  324 mg Oral Once Henry Russel, [place]   324 mg at 09/10/11 0215   . HYDROmorphone (DILAUDID) injection 1 mg  1 mg Intravenous Once [place], [place], [place]   1 mg at 09/10/11 0225   . LORazepam (ATIVAN) injection 1 mg  1 mg Intravenous Once [place], [place]   1 mg at 09/10/11 0233   . LORazepam (ATIVAN) injection 1 mg  1 mg Intravenous Once Jeralynn Vaquera, [place]       . nitroglycerin (NITRO-BID) 2 % ointment 0.5 inch  0.5 inch Topical Once Henry Russel, [place]   0.5 inch at 09/10/11 0218   . nitroglycerin (NITROSTAT) SL tablet 0.4 mg  0.4 mg Sublingual Q5 [place], [place], [place]   0.4 mg at 09/10/11 0226   . ondansetron (ZOFRAN) injection 4 mg  4 mg Intravenous Once Henry Russel, [place]   4 mg at 09/10/11 0224   . sodium chloride 0.9 % bolus 1,000 mL  1,000 mL Intravenous Once Henry Russel, [place]   1,000 mL at 09/10/11 0210     No current outpatient prescriptions on file.       Past Medical History     Past Medical History   Diagnosis Date   . Heart murmur      No past surgical history on file.    Family History     No  family history on file.    Social History     History   Substance Use Topics   . Smoking status: Unknown If Ever Smoked   . Smokeless tobacco: Not on file   . Alcohol Use: No      pt claims he "is not a drinker", admits to ETOH use tonight       Allergies     No Known Allergies    Review of Systems     Review of Systems   Constitutional: Positive for diaphoresis. Negative for fever and chills.   HENT: Negative for ear pain, sore throat and neck pain.    Eyes: Negative for discharge and redness.   Respiratory: Positive for shortness of breath. Negative for cough and hemoptysis.    Cardiovascular: Positive for chest pain and leg swelling.   Gastrointestinal: Positive for nausea and vomiting. Negative for diarrhea.   Genitourinary: Negative for dysuria and hematuria.   Musculoskeletal: Negative for back pain.        Shoulder pain   Skin: Negative for itching and rash.  Neurological: Negative for dizziness and loss of consciousness.   Psychiatric/Behavioral: Negative for suicidal ideas and substance abuse.         Physical Exam   BP 115/67  Pulse 65  Temp(Src) 99.1 F (37.3 C) (Oral)  Resp 16  Ht 1.727 m  Wt 81.647 kg  BMI 27.38 kg/m2  SpO2 100%    Physical Exam   Nursing note and vitals reviewed.  Constitutional: He is oriented to person, place, and time. He appears distressed.        Well developed.     HENT:   Head: Normocephalic and atraumatic.   Eyes: Pupils are equal, round, and reactive to light.   Neck: Normal range of motion. Neck supple.   Cardiovascular: Normal rate and regular rhythm.         tachycardic   Pulmonary/Chest: Effort normal and breath sounds normal.   Abdominal: Soft. Bowel sounds are normal.   Musculoskeletal: Normal range of motion.   Neurological: He is alert and oriented to person, place, and time.   Skin: Skin is warm and dry.   Psychiatric: Memory and affect normal.        Anxious affect       Diagnostic Study Results     Labs -     Results     Procedure Component Value Units  Date/Time    Urine Rapid Drug Screen [[id number]] Collected:09/10/11 0417    Specimen Information:Urine Updated:09/10/11 0441     Amphetamine Screen, UR Negative      Barbiturate Screen, UR Negative      Benzodiazepine Screen, UR Negative      Cannabinoid Screen, UR Negative      Cocaine, UR Positive      Opiate Screen, UR Positive      PCP Screen, UR Negative     Comprehensive Metabolic Panel (CMP) [[id number]]  (Abnormal) Collected:09/10/11 0240    Specimen Information:Blood Updated:09/10/11 0326     Glucose 114 (H) mg/dL      BUN 78.2 mg/dL      Creatinine 0.9 mg/dL      Sodium 956 mEq/L      Potassium 4.0 mEq/L      Chloride 103 mEq/L      CO2 25 mEq/L      Calcium 8.6 mg/dL      Protein, Total 6.6 g/dL      Albumin 3.6 g/dL      AST (SGOT) 21 U/L      ALT 13 U/L      Alkaline Phosphatase 65 U/L      Bilirubin, Total 0.5 mg/dL      Globulin 3.0 g/dL      Albumin/Globulin Ratio 1.2      Anion Gap 9.0     Troponin I [[id number]] Collected:09/10/11 0240    Specimen Information:Blood Updated:09/10/11 0320     Troponin I 0.01 ng/mL     HEMOLYZED INDEX [[id number]] Collected:09/10/11 0240     Hemolyzed Index 9 Index Updated:09/10/11 0306    GFR [[id number]] Collected:09/10/11 0240     EGFR >60.0 Updated:09/10/11 0306    D-Dimer [[id number]] Collected:09/10/11 0157     D-Dimer 0.41 ug/mL FEU Updated:09/10/11 0220    CBC with differential [[id number]]  (Abnormal) Collected:09/10/11 0157    Specimen Information:Blood Updated:09/10/11 0213     WBC 9.96 x10 3/uL      RBC 3.86 (L) x10 6/uL      Hgb 12.1 (L) g/dL      Hematocrit 63.8 (L) %  MCV 89.9 fL      MCH 31.3 pg      MCHC 34.9 g/dL      RDW 13 %      Platelets 197 x10 3/uL      MPV 10.6 fL      Neutrophils 77 (H) %      Lymphocytes Automated 15 %      Monocytes 7 %      Eosinophils Automated 1 %      Basophils Automated 0 %      Immature Granulocyte 0 %      Nucleated RBC 0 /100 WBC      Neutrophils Absolute 7.67 x10 3/uL      Abs Lymph Automated 1.52 x10 3/uL      Abs  Mono Automated 0.66 x10 3/uL      Abs Eos Automated 0.09 x10 3/uL      Absolute Baso Automated 0.02 x10 3/uL      Absolute Immature Granulocyte 0.03 x10 3/uL           Radiologic Studies -   Radiology Results (24 Hour)     Procedure Component Value Units Date/Time    Chest AP Portable [[id number]]     Order Status:Sent  Updated:09/10/11 0347      .    Clinical Course in the Emergency Department     Consultations:  D/w dr Karie Mainland.  Admit to dr Gweneth Dimitri.  Will eval in ed.      Re-evaluations:  Still with pain.   350  Pt is sleeping      Medical Decision Making   I am the first provider for this patient.    I reviewed the vital signs, available nursing notes, past medical history, past surgical history, family history and social history.    Vital Signs-Reviewed the patient's vital signs.     Patient Vitals for the past 12 hrs:   BP Temp Pulse Resp   09/10/11 0419 115/67 mmHg - 65  16    09/10/11 0303 114/65 mmHg - 86  16    09/10/11 0232 133/59 mmHg - 107  22    09/10/11 0221 133/65 mmHg - 119  -   09/10/11 0216 149/77 mmHg - 122  -   09/10/11 0210 152/81 mmHg - 108  -   09/10/11 0152 127/89 mmHg 99.1 F (37.3 C) 104  20        Pulse Oximetry Analysis - Normal 97% on RA    EKG:  Interpreted by the EP.   Time Interpreted:    Rate:  112   Rhythm: Sinus Tachycardia          Cardiac Monitor:  Rate: 115  Rhythm:  Sinus Tachycardia     Provider Notes:  Pt reports cp, sob, n/v and sweating after doing cocaine tonight.      Diagnosis and Treatment Plan       Clinical Impression:   1. Acute chest pain    2. Cocaine abuse        _______________________________    Henry Russel, PA  09/10/11 0254    Henry Russel, PA  09/10/11 0354    Henry Russel, PA  09/10/11 [phone]

## 2011-09-10 NOTE — ED Provider Notes (Signed)
I have reviewed and agree with history. The pertinent physical exam has been documented. The patient was seen and examined by PA or NP; plan of care was discussed with me.    Pt seen & examined by me. No stridor, no drooling, no accessory muscle use. Anicteric. Pt resting comfortably, sleeping during most of his ED stay.      Annett Fabian, MD  09/10/11 [phone]

## 2011-09-11 ENCOUNTER — Emergency Department: Payer: Self-pay

## 2011-09-11 ENCOUNTER — Emergency Department
Admission: EM | Admit: 2011-09-11 | Discharge: 2011-09-12 | Disposition: A | Discharge status: Home / Self Care | Payer: Medicaid Other | Attending: Emergency Medicine | Admitting: Emergency Medicine

## 2011-09-11 ENCOUNTER — Emergency Department: Payer: Medicaid Other

## 2011-09-11 DIAGNOSIS — R079 Chest pain, unspecified: Secondary | ICD-10-CM | POA: Insufficient documentation

## 2011-09-11 LAB — CBC AND DIFFERENTIAL
Basophils Absolute Automated: 0.02 10*3/uL (ref 0.00–0.20)
Basophils Automated: 0 % (ref 0–2)
Eosinophils Absolute Automated: 0.18 10*3/uL (ref 0.00–0.70)
Eosinophils Automated: 2 % (ref 0–5)
Hematocrit: 32.8 % — ABNORMAL LOW (ref 42.0–52.0)
Hgb: 11.3 g/dL — ABNORMAL LOW (ref 13.0–17.0)
Immature Granulocytes Absolute: 0.01 10*3/uL
Immature Granulocytes: 0 % (ref 0–1)
Lymphocytes Absolute Automated: 1.91 10*3/uL (ref 0.50–4.40)
Lymphocytes Automated: 26 % (ref 15–41)
MCH: 31 pg (ref 28.0–32.0)
MCHC: 34.5 g/dL (ref 32.0–36.0)
MCV: 90.1 fL (ref 80.0–100.0)
MPV: 10.6 fL (ref 9.4–12.3)
Monocytes Absolute Automated: 0.72 10*3/uL (ref 0.00–1.20)
Monocytes: 10 % (ref 0–11)
Neutrophils Absolute: 4.48 10*3/uL (ref 1.80–8.10)
Neutrophils: 61 % (ref 52–75)
Nucleated RBC: 0 /100 WBC (ref 0–1)
Platelets: 188 10*3/uL (ref 140–400)
RBC: 3.64 10*6/uL — ABNORMAL LOW (ref 4.70–6.00)
RDW: 13 % (ref 12–15)
WBC: 7.31 10*3/uL (ref 3.50–10.80)

## 2011-09-11 MED ORDER — KETOROLAC TROMETHAMINE 30 MG/ML IJ SOLN
30.00 mg | Freq: Once | INTRAMUSCULAR | Status: AC
Start: 2011-09-12 — End: 2011-09-11
  Administered 2011-09-11: 30 mg via INTRAVENOUS
  Filled 2011-09-11: qty 1

## 2011-09-11 MED ORDER — SODIUM CHLORIDE 0.9 % IV BOLUS
1000.00 mL | Freq: Once | INTRAVENOUS | Status: AC
Start: 2011-09-12 — End: 2011-09-12
  Administered 2011-09-11: 1000 mL via INTRAVENOUS

## 2011-09-11 NOTE — Plan of Care (Signed)
A/ox3, s.brady on the monitor,c/o L shoulder pain medicated with morphine 2mg  iv,advised npo after midnight,  For stress test in am and also for echocardiogram.Advised to call for assisstance, CIWA score is 0.

## 2011-09-11 NOTE — ED Notes (Signed)
C/o CP, sharp pain to left chest radiates to left arm/shoulder, pt reports vomitingx1 pta

## 2011-09-11 NOTE — Discharge Summary -  Nursing (Signed)
At 0600am pt decides to leave wants to sign ama, call placed to Dr Reece Agar. Tefera and Dr Berline Lopes, both dr's are aware,  Pt left at 0645 with peripheral line discontinued and telemetry box returned  to telemetry.

## 2011-09-12 ENCOUNTER — Inpatient Hospital Stay
Admission: EM | Admit: 2011-09-12 | Discharge: 2011-09-21 | DRG: 182 | Disposition: A | Discharge status: Home / Self Care | Payer: Medicaid Other | Attending: Family Medicine | Admitting: Family Medicine

## 2011-09-12 ENCOUNTER — Inpatient Hospital Stay: Payer: Medicaid Other | Admitting: Family Medicine

## 2011-09-12 ENCOUNTER — Emergency Department: Payer: Medicaid Other

## 2011-09-12 DIAGNOSIS — K2970 Gastritis, unspecified, without bleeding: Secondary | ICD-10-CM | POA: Diagnosis present

## 2011-09-12 DIAGNOSIS — R109 Unspecified abdominal pain: Secondary | ICD-10-CM

## 2011-09-12 DIAGNOSIS — F609 Personality disorder, unspecified: Secondary | ICD-10-CM | POA: Diagnosis present

## 2011-09-12 DIAGNOSIS — F1010 Alcohol abuse, uncomplicated: Secondary | ICD-10-CM | POA: Diagnosis present

## 2011-09-12 DIAGNOSIS — F1910 Other psychoactive substance abuse, uncomplicated: Secondary | ICD-10-CM

## 2011-09-12 DIAGNOSIS — R112 Nausea with vomiting, unspecified: Secondary | ICD-10-CM

## 2011-09-12 DIAGNOSIS — K2980 Duodenitis without bleeding: Principal | ICD-10-CM | POA: Diagnosis present

## 2011-09-12 DIAGNOSIS — F1410 Cocaine abuse, uncomplicated: Secondary | ICD-10-CM | POA: Diagnosis present

## 2011-09-12 DIAGNOSIS — I498 Other specified cardiac arrhythmias: Secondary | ICD-10-CM | POA: Diagnosis present

## 2011-09-12 DIAGNOSIS — K2990 Gastroduodenitis, unspecified, without bleeding: Secondary | ICD-10-CM | POA: Diagnosis present

## 2011-09-12 DIAGNOSIS — K449 Diaphragmatic hernia without obstruction or gangrene: Secondary | ICD-10-CM | POA: Diagnosis present

## 2011-09-12 DIAGNOSIS — F17200 Nicotine dependence, unspecified, uncomplicated: Secondary | ICD-10-CM | POA: Diagnosis present

## 2011-09-12 DIAGNOSIS — R079 Chest pain, unspecified: Secondary | ICD-10-CM | POA: Diagnosis present

## 2011-09-12 DIAGNOSIS — F1920 Other psychoactive substance dependence, uncomplicated: Secondary | ICD-10-CM | POA: Diagnosis present

## 2011-09-12 DIAGNOSIS — F19939 Other psychoactive substance use, unspecified with withdrawal, unspecified: Secondary | ICD-10-CM | POA: Diagnosis present

## 2011-09-12 DIAGNOSIS — F19239 Other psychoactive substance dependence with withdrawal, unspecified: Secondary | ICD-10-CM | POA: Diagnosis present

## 2011-09-12 DIAGNOSIS — R748 Abnormal levels of other serum enzymes: Secondary | ICD-10-CM | POA: Diagnosis present

## 2011-09-12 LAB — CBC AND DIFFERENTIAL
Basophils Absolute Automated: 0.02 10*3/uL (ref 0.00–0.20)
Basophils Automated: 0 % (ref 0–2)
Eosinophils Absolute Automated: 0.13 10*3/uL (ref 0.00–0.70)
Eosinophils Automated: 2 % (ref 0–5)
Hematocrit: 30.2 % — ABNORMAL LOW (ref 42.0–52.0)
Hgb: 10.7 g/dL — ABNORMAL LOW (ref 13.0–17.0)
Immature Granulocytes Absolute: 0.01 10*3/uL
Immature Granulocytes: 0 % (ref 0–1)
Lymphocytes Absolute Automated: 1.89 10*3/uL (ref 0.50–4.40)
Lymphocytes Automated: 30 % (ref 15–41)
MCH: 31.8 pg (ref 28.0–32.0)
MCHC: 35.4 g/dL (ref 32.0–36.0)
MCV: 89.6 fL (ref 80.0–100.0)
MPV: 9.6 fL (ref 9.4–12.3)
Monocytes Absolute Automated: 0.65 10*3/uL (ref 0.00–1.20)
Monocytes: 10 % (ref 0–11)
Neutrophils Absolute: 3.63 10*3/uL (ref 1.80–8.10)
Neutrophils: 57 % (ref 52–75)
Nucleated RBC: 0 /100 WBC (ref 0–1)
Platelets: 167 10*3/uL (ref 140–400)
RBC: 3.37 10*6/uL — ABNORMAL LOW (ref 4.70–6.00)
RDW: 13 % (ref 12–15)
WBC: 6.32 10*3/uL (ref 3.50–10.80)

## 2011-09-12 LAB — ECG 12-LEAD
Atrial Rate: 90 {beats}/min
P Axis: 61 degrees
P-R Interval: 142 ms
Q-T Interval: 372 ms
QRS Duration: 80 ms
QTC Calculation (Bezet): 455 ms
R Axis: -25 degrees
T Axis: -4 degrees
Ventricular Rate: 90 {beats}/min

## 2011-09-12 LAB — COMPREHENSIVE METABOLIC PANEL
ALT: 13 U/L (ref 0–55)
ALT: 11 U/L (ref 0–55)
AST (SGOT): 24 U/L (ref 5–34)
AST (SGOT): 23 U/L (ref 5–34)
Albumin/Globulin Ratio: 1.3 (ref 0.9–2.2)
Albumin/Globulin Ratio: 1.4 (ref 0.9–2.2)
Albumin: 3.9 g/dL (ref 3.5–5.0)
Albumin: 3.6 g/dL (ref 3.5–5.0)
Alkaline Phosphatase: 64 U/L (ref 40–150)
Alkaline Phosphatase: 61 U/L (ref 40–150)
Anion Gap: 14 (ref 5.0–15.0)
BUN: 12 mg/dL (ref 9.0–21.0)
BUN: 11 mg/dL (ref 7–21)
Bilirubin, Total: 0.7 mg/dL (ref 0.2–1.2)
Bilirubin, Total: 0.7 mg/dL (ref 0.2–1.2)
CO2: 22 mEq/L (ref 22–29)
CO2: 24 mEq/L (ref 22–29)
Calcium: 9.3 mg/dL (ref 8.5–10.5)
Calcium: 8.6 mg/dL (ref 8.5–10.5)
Chloride: 105 mEq/L (ref 98–107)
Chloride: 105 mEq/L (ref 98–107)
Creatinine: 0.9 mg/dL (ref 0.7–1.3)
Creatinine: 0.9 mg/dL (ref 0.7–1.3)
Globulin: 3 g/dL (ref 2.0–3.6)
Globulin: 2.6 g/dL (ref 2.0–3.6)
Glucose: 116 mg/dL — ABNORMAL HIGH (ref 70–100)
Glucose: 137 mg/dL — ABNORMAL HIGH (ref 70–100)
Potassium: 3.6 mEq/L (ref 3.5–5.1)
Potassium: 3.5 mEq/L (ref 3.5–5.1)
Protein, Total: 6.9 g/dL (ref 6.0–8.3)
Protein, Total: 6.2 g/dL (ref 6.0–8.3)
Sodium: 141 mEq/L (ref 136–145)
Sodium: 137 mEq/L (ref 136–145)

## 2011-09-12 LAB — IHS D-DIMER: D-Dimer: 0.38 ug/mL FEU (ref 0.00–0.51)

## 2011-09-12 LAB — TROPONIN I
Troponin I: <0.01 ng/mL (ref 0.00–0.09)
Troponin I: 0.01 ng/mL (ref 0.00–0.09)

## 2011-09-12 LAB — CK: Creatine Kinase (CK): 319 U/L — ABNORMAL HIGH (ref 30–200)

## 2011-09-12 LAB — GFR
EGFR: >60
EGFR: >60

## 2011-09-12 LAB — B-TYPE NATRIURETIC PEPTIDE: B-Natriuretic Peptide: 14 pg/mL (ref 0–100)

## 2011-09-12 LAB — CKMB: Creatinine Kinase MB (CKMB): 2.8 ng/mL (ref 0.0–4.9)

## 2011-09-12 LAB — HEMOLYSIS INDEX: Hemolysis Index: 9 Index (ref 0–18)

## 2011-09-12 MED ORDER — NITROGLYCERIN 0.4 MG SL SUBL
0.40 mg | SUBLINGUAL_TABLET | SUBLINGUAL | Status: DC | PRN
Start: 2011-09-12 — End: 2011-09-21
  Administered 2011-09-12 (×3): 0.4 mg via SUBLINGUAL
  Filled 2011-09-12: qty 1

## 2011-09-12 MED ORDER — ONDANSETRON 4 MG PO TBDP
4.00 mg | ORAL_TABLET | Freq: Four times a day (QID) | ORAL | Status: AC | PRN
Start: 2011-09-12 — End: 2011-09-15

## 2011-09-12 MED ORDER — IOHEXOL 350 MG/ML IV SOLN
INTRAVENOUS | Status: AC
Start: 2011-09-12 — End: 2011-09-13
  Filled 2011-09-12: qty 100

## 2011-09-12 MED ORDER — IOHEXOL 350 MG/ML IV SOLN
100.00 mL | Freq: Once | INTRAVENOUS | Status: AC | PRN
Start: 2011-09-12 — End: 2011-09-12
  Administered 2011-09-12: 100 mL via INTRAVENOUS

## 2011-09-12 MED ORDER — PROMETHAZINE HCL 25 MG/ML IJ SOLN
12.50 mg | Freq: Once | INTRAMUSCULAR | Status: AC
Start: 2011-09-12 — End: 2011-09-12
  Administered 2011-09-12: 12.5 mg via INTRAVENOUS
  Filled 2011-09-12: qty 1

## 2011-09-12 MED ORDER — ASPIRIN 81 MG PO CHEW
324.00 mg | CHEWABLE_TABLET | Freq: Once | ORAL | Status: AC
Start: 2011-09-12 — End: 2011-09-12
  Administered 2011-09-12: 324 mg via ORAL
  Filled 2011-09-12: qty 4

## 2011-09-12 MED ORDER — BUTALBITAL-APAP-CAFFEINE 50-325-40 MG PO TABS
2.00 | ORAL_TABLET | Freq: Once | ORAL | Status: DC
Start: 2011-09-12 — End: 2011-09-12

## 2011-09-12 MED ORDER — IOHEXOL 240 MG/ML IJ SOLN
INTRAMUSCULAR | Status: AC
Start: 2011-09-12 — End: 2011-09-13
  Filled 2011-09-12: qty 50

## 2011-09-12 MED ORDER — SODIUM CHLORIDE 0.9 % IV BOLUS
1000.00 mL | Freq: Once | INTRAVENOUS | Status: AC
Start: 2011-09-12 — End: 2011-09-12
  Administered 2011-09-12: 1000 mL via INTRAVENOUS

## 2011-09-12 MED ORDER — MORPHINE SULFATE 2 MG/ML IJ/IV SOLN (WRAP)
4.00 mg | Freq: Once | INTRAVENOUS | Status: AC
Start: 2011-09-12 — End: 2011-09-12
  Administered 2011-09-12: 4 mg via INTRAVENOUS
  Filled 2011-09-12: qty 2

## 2011-09-12 NOTE — ED Notes (Signed)
Introduced self as primary RN.  Patient oriented to room, call light within reach.  Needs assessed, resting quietly.

## 2011-09-12 NOTE — ED Notes (Signed)
Patient resting quietly, needs assessed, call light within reach.

## 2011-09-12 NOTE — ED Provider Notes (Addendum)
Physician/Midlevel provider first contact with patient: 09/12/11 2012         History     Chief Complaint   Patient presents with   . Shortness of Breath   . Abdominal Pain     Patient is a 46 y.o. male presenting with abdominal pain. The history is provided by the patient. No language interpreter was used.   Abdominal Pain  The primary symptoms of the illness include abdominal pain, nausea and vomiting (x1 1.5 hours PTA). The primary symptoms of the illness do not include diarrhea. Primary symptoms comment: and CP The current episode started 1 to 2 hours ago. The onset of the illness was sudden. The problem has not changed since onset.  The abdominal pain began 1 to 2 hours ago. The pain came on suddenly. The abdominal pain has been unchanged since its onset. The abdominal pain is generalized. The abdominal pain does not radiate. The abdominal pain is relieved by nothing. Exacerbated by: nothing.     46 y/o male presents to ED c/o abd pain, with pressure-like and sharp CP, that started 1.5 hours PTA. Pt also notes N/V (vomited x1, 1.5 hours ago) and bilateral calf pain, but denies diarrhea. Pt was admitted to [facility] for CP, but left AMA because pt had to go to work. Pt states he last used cocaine a couple days ago. Pt has no MHx of hernia or HTN. FHx of MI (grandfather).   Past Medical History   Diagnosis Date   . Heart murmur        No past surgical history on file.    History reviewed. No pertinent family history.    Social  History   Substance Use Topics   . Smoking status: Current Everyday Smoker -- 1.0 packs/day for .5 years   . Smokeless tobacco: Not on file   . Alcohol Use: No      pt claims he "is not a drinker", admits to ETOH use tonight       .     No Known Allergies    Current/Home Medications    ONDANSETRON (ZOFRAN ODT) 4 MG DISINTEGRATING TABLET    Take 1 tablet (4 mg total) by mouth every 6 (six) hours as needed for Nausea.        Review of Systems   Cardiovascular: Positive for chest pain (presure and  sharp).   Gastrointestinal: Positive for nausea, vomiting (x1 1.5 hours PTA) and abdominal pain. Negative for diarrhea.   Musculoskeletal:        Notes bilateral calf pain   Hematological: Does not bruise/bleed easily.   All other systems reviewed and are negative.        Physical Exam    BP 132/78  Pulse 56  Temp(Src) 98.3 F (36.8 C) (Temporal Artery)  Resp 14  Ht 1.727 m  Wt 79.379 kg  BMI 26.61 kg/m2  SpO2 97%    Physical Exam  Nursing note and vitals reviewed.  Constitutional:  Well developed, well nourished.  Awake & Oriented x3. Slightly uncooperative, anxious/agitated middle-aged man in severe pain.  Head:  Atraumatic.  Normocephalic.    Eyes:  PERRL.  EOMI.  Conjunctivae are not pale.  ENT:  Mucous membranes are moist and intact.  Oropharynx is clear and symmetric.  Patent airway.  Neck:  Supple.  Full ROM.  No masses.  Cardiovascular:  Regular rate.  Regular rhythm.  No murmurs, rubs, or gallops.  Pulmonary/Chest:  No evidence of respiratory distress.  Clear  to auscultation bilaterally. Good air movement bilaterally.  No wheezing, rales or rhonchi.   Abdominal:  Soft and non-distended.  There is no tenderness.  No rebound, guarding, or rigidity.  Good bowel sounds.    Back:  FROM. Nontender.  Extremities:  No edema.   No cyanosis.  No clubbing.  Full range of motion in all extremities. Calf tenderness bilaterally, no swelling.   Skin:  Skin is warm and dry.  No diaphoresis. No rash.   Neurological:  Alert, awake, and appropriate.  Normal speech.  Sensation normal. Motor normal.  Psychiatric:  Good eye contact.  Normal interaction, affect, and behavior      MDM and ED Course     ED Medication Orders      Start     Status Ordering Provider    09/12/11 2145   morphine injection 4 mg   Once      Route: Intravenous  Ordered Dose: 4 mg         Last MAR action:  Given Jeremy Johann [facility]    09/12/11 2145   sodium chloride 0.9 % bolus 1,000 mL   Once      Route: Intravenous  Ordered Dose: 1,000 mL          Last MAR action:  New Bag Jeremy Johann [facility]    09/12/11 2115   promethazine (PHENERGAN) injection 12.5 mg   Once      Route: Intravenous  Ordered Dose: 12.5 mg         Last MAR action:  Given Jeremy Johann [facility]    09/12/11 2104   nitroglycerin (NITROSTAT) SL tablet 0.4 mg   Every 5 min PRN      Route: Sublingual  Ordered Dose: 0.4 mg         Last MAR action:  Given Jeremy Johann [facility]    09/12/11 2100      Once,   Status:  Discontinued      Route: Oral  Ordered Dose: 2 tablet         Discontinued Marsalis Beaulieu RASHIDAH    09/12/11 2057   iohexol (OMNIPAQUE) 240 MG/ML IV/ORAL solution      Comments: Created by cabinet override        Last MAR action:  Not Given Jeremy Johann [facility]    09/12/11 2015   aspirin chewable tablet 324 mg   Once      Route: Oral  Ordered Dose: 324 mg         Last MAR action:  Given Lilyanne Mcquown RASHIDAH                 MDM  LABORATORY RESULTS: Ordered and independently interpreted available laboratory tests.  Results for orders placed during the hospital encounter of 09/12/11   COMPREHENSIVE METABOLIC PANEL       Component Value Range    Glucose 137 (*) 70 - 100 mg/dL    BUN 11  7 - 21 mg/dL    Creatinine 0.9  0.7 - 1.3 mg/dL    Sodium 355  732 - 202 mEq/L    Potassium 3.5  3.5 - 5.1 mEq/L    Chloride 105  98 - 107 mEq/L    CO2 24  22 - 29 mEq/L    Calcium 8.6  8.5 - 10.5 mg/dL    Protein, Total 6.2  6.0 - 8.3 g/dL    Albumin 3.6  3.5 - 5.0 g/dL    AST (SGOT) 23  5 -  34 U/L    ALT 11  0 - 55 U/L    Alkaline Phosphatase 61  40 - 150 U/L    Bilirubin, Total 0.7  0.2 - 1.2 mg/dL    Globulin 2.6  2.0 - 3.6 g/dL    Albumin/Globulin Ratio 1.4  0.9 - 2.2   CBC AND DIFFERENTIAL       Component Value Range    WBC 6.32  3.50 - 10.80 x10 3/uL    RBC 3.37 (*) 4.70 - 6.00 x10 6/uL    Hgb 10.7 (*) 13.0 - 17.0 g/dL    Hematocrit 91.4 (*) 42.0 - 52.0 %    MCV 89.6  80.0 - 100.0 fL    MCH 31.8  28.0 - 32.0 pg    MCHC 35.4  32.0 - 36.0 g/dL    RDW 13  12 - 15 %    Platelets 167  140 - 400 x10 3/uL    MPV 9.6   9.4 - 12.3 fL    Neutrophils 57  52 - 75 %    Lymphocytes Automated 30  15 - 41 %    Monocytes 10  0 - 11 %    Eosinophils Automated 2  0 - 5 %    Basophils Automated 0  0 - 2 %    Immature Granulocyte 0  0 - 1 %    Nucleated RBC 0  0 - 1 /100 WBC    Neutrophils Absolute 3.63  1.80 - 8.10 x10 3/uL    Abs Lymph Automated 1.89  0.50 - 4.40 x10 3/uL    Abs Mono Automated 0.65  0.00 - 1.20 x10 3/uL    Abs Eos Automated 0.13  0.00 - 0.70 x10 3/uL    Absolute Baso Automated 0.02  0.00 - 0.20 x10 3/uL    Absolute Immature Granulocyte 0.01  0 x10 3/uL   CK       Component Value Range    Creatine Kinase (CK) 319 (*) 30 - 200 U/L   TROPONIN I       Component Value Range    Troponin I 0.01  0.00 - 0.09 ng/mL   I[facility] D-DIMER       Component Value Range    D-Dimer 0.38  0.00 - 0.51 ug/mL FEU   GFR       Component Value Range    EGFR >60.0     CKMB       Component Value Range    Creatinine Kinase MB (CKMB) 2.8  0.0 - 4.9 ng/mL   B-TYPE NATRIURETIC PEPTIDE       Component Value Range    B-Natriuretic Peptide 14  0 - 100 pg/mL       IMAGING STUDIES: Imaging studies were ordered and interpreted by ED Physician.  No acute findings    CT ABDOMEN PELVIS W IV/ WO PO CONT    Final Result:  Nonobstructive left renal stone.       XR CHEST 2 VIEWS    (Results Pending)       MEDICAL DECISION MAKING:    MDM: Probable Cocaine Chest Pain - I have discussed with patient and/or family/caretaker chest pain concerns for ACS.  Will admit for Rule out  With stress test likely tomorrow. I have low suspicion for  vascular, infectious, respiratory, or other emergent medical condition based on my evaluation in the ED.         Oxygen Saturation by Pulse Oximetry: 97%  Interventions:  Patient Observed.  Interpretation: Normal    Vital Signs: Reviewed the patient's vital signs.   Nursing Notes: Reviewed and utilized available nursing notes.  Medical Records Reviewed: Reviewed available past medical records.    Cardiac Monitor Interpretation:  Rate: 80  Rhythm:  NSR    EKG Interpretation:   Signed and interpreted by the EP.   Time Interpreted: 13  Rate: 78  Rhythm: NSR with LAD  Interpretation:  No acute T or ST changes  Comparison: None    Consults: Hospitalist Dr. Lorel Monaco - Will admit and treat.  Recommends continued IVF, pains meds and Ativan PRN.      Counseling: The emergency provider has spoken with the patient and discussed today's findings, in addition to providing specific details for the plan of care. Questions are answered and there is agreement with the plan.     Scribe Attestation: Barrister's clerk by Corinne Ports, acting as scribe for, and in the presence of Varney Daily, MD .     Physician Attestation: I, Varney Daily, MD, have been the primary provider for [name] during this Emergency Dept visit and have reviewed the chart documented by the scribe for accuracy and agree with its content.     MDM:   I have discussed extensively via interpreter line the physical findings, labs, radiological findings, diagnosis and plan of care with the patient and he expressed understanding and agreement with this management.     Procedures    Clinical Impression & Disposition     Clinical Impression  Final diagnoses:   Chest pain, central        ED Disposition     Admit Bed Type: General [8]  Bed request comments: Soft Telemetry  Admitting Physician: Dorathy Daft [[id number]]  Patient Class: Observation [104]             New Prescriptions    No medications on file        Treatment Team: Scribe: Marlyn Corporal, MD  09/12/11 2208    Varney Daily, MD  09/12/11 2217

## 2011-09-12 NOTE — ED Notes (Signed)
Bed:M 11<BR> Expected date:<BR> Expected time:<BR> Means of arrival:<BR> Comments:<BR> For possible medic

## 2011-09-12 NOTE — ED Provider Notes (Signed)
F/U progress note to radiology CXR read from last night: Mild interstitial prominence. It is unclear if this is to simply due to vascular crowding caused by a shallow inspiration or at this is due to new CHF. Xray was read by ED attending as NAD    I called & left a voicemail with the father's contact phone # to see how the pt was doing. No call back as of yet. 11:37 AM   Annett Fabian, MD    Annett Fabian, MD  09/12/11 [phone]

## 2011-09-12 NOTE — ED Notes (Signed)
Patient resting quietly, needs assessed, call light within reach.  Pt aware of need for urine specimen.  Urinal placed bedside.

## 2011-09-12 NOTE — Discharge Instructions (Signed)
Chest Pain of Unclear Etiology     You have been seen for chest pain. The cause of your pain is not yet known.     Your doctor has learned about your medical history, examined you, and checked any tests that were done. Still, it is unclear why you are having pain. The doctor thinks there is only a very small chance that your pain is caused by a life-threatening condition. Later, your primary care doctor might do more tests or check you again.     Sometimes chest pain is caused by a dangerous condition, like a heart attack, aorta injury, blood clot in the lung, or collapsed lung. It is unlikely that your pain is caused by a life-threatening condition if: Your chest pain lasts only a few seconds at a time; you are not short of breath, nauseated (sick to your stomach), sweaty, or lightheaded; your pain gets worse when you twist or bend; your pain improves with exercise or hard work.     Chest pain is serious. It is VERY IMPORTANT that you follow up with your regular doctor and seek medical attention immediately here or at the nearest Emergency Department if your symptoms become worse or they change.     YOU SHOULD SEEK MEDICAL ATTENTION IMMEDIATELY, EITHER HERE OR AT THE NEAREST EMERGENCY DEPARTMENT, IF ANY OF THE FOLLOWING OCCURS:  · Your pain gets worse.  · Your pain makes you short of breath, nauseated, or sweaty.  · Your pain gets worse when you walk, go up stairs, or exert yourself.  · You feel weak, lightheaded, or faint.  · It hurts to breathe.  · Your leg swells.  · Your symptoms get worse or you have new symptoms or concerns.

## 2011-09-12 NOTE — ED Notes (Signed)
Pt ambulating to bathroom without difficulty 

## 2011-09-12 NOTE — ED Provider Notes (Signed)
Physician/Midlevel provider first contact with patient: 09/11/11 2312       Pt c/o left side chest and arm pain that started tonight.  Pt was seen a day before and was admitted for chest pain, but signed out before going upstairs.  Pt denies cocaine use today, but used it 2 days ago.  No recent travel.  Hx obtained from the pt.    Review of Systems   Constitutional: Negative.    HENT: Negative.    Respiratory: Negative.    Cardiovascular: Positive for chest pain.   Gastrointestinal: Positive for vomiting.   Musculoskeletal: Negative.    Neurological: Negative.    Psychiatric/Behavioral: Positive for substance abuse.   All other systems reviewed and are negative.    Physical Exam   Constitutional: He is oriented to person, place, and time. He appears distressed.   HENT:   Head: Normocephalic and atraumatic.   Neck: Normal range of motion. Neck supple.   Cardiovascular: Normal rate and regular rhythm.    Pulmonary/Chest: Effort normal and breath sounds normal.   Abdominal: Soft. Bowel sounds are normal.   Musculoskeletal: Normal range of motion.   Neurological: He is alert and oriented to person, place, and time. GCS score is 15.     Oxygen saturations are  98%.    EKG: normal EKG, normal sinus rhythm, normal axis, normal intervals, no ST or T wave abnormalities    Chest xray:  Reviewed by attending, no infiltrates.  No pneumothorax        1:32 AM  Pt sleeping soundly.    2:04 AM  Pt still sleeping very soundly.  After rousing the pt from sleep, he starts c/o pain.    I do not suspect ACS, as the pt with neg enzymes again.  Will d/c.  Pt had normal d dimer 2 days ago.  Do not suspect PE.  PERC negative.    Jethro Bastos, MD  09/12/11 [phone]

## 2011-09-12 NOTE — ED Notes (Signed)
Pt c/o sharp upper left CP, c/o SOB, diffuse abd pain x 35 minutes ago

## 2011-09-13 DIAGNOSIS — F1910 Other psychoactive substance abuse, uncomplicated: Secondary | ICD-10-CM

## 2011-09-13 DIAGNOSIS — R112 Nausea with vomiting, unspecified: Secondary | ICD-10-CM

## 2011-09-13 DIAGNOSIS — R079 Chest pain, unspecified: Secondary | ICD-10-CM

## 2011-09-13 DIAGNOSIS — R109 Unspecified abdominal pain: Secondary | ICD-10-CM

## 2011-09-13 LAB — TROPONIN I
Troponin I: <0.01 ng/mL (ref 0.00–0.09)
Troponin I: 0.02 ng/mL (ref 0.00–0.09)
Troponin I: 0.01 ng/mL (ref 0.00–0.09)

## 2011-09-13 LAB — CK: Creatine Kinase (CK): 241 U/L — ABNORMAL HIGH (ref 30–200)

## 2011-09-13 LAB — CKMB: Creatinine Kinase MB (CKMB): 1.8 ng/mL (ref 0.0–4.9)

## 2011-09-13 MED ORDER — ONDANSETRON 4 MG PO TBDP
4.00 mg | ORAL_TABLET | Freq: Four times a day (QID) | ORAL | Status: DC | PRN
Start: 2011-09-13 — End: 2011-09-21
  Administered 2011-09-14 – 2011-09-18 (×2): 4 mg via ORAL
  Filled 2011-09-13 (×2): qty 1

## 2011-09-13 MED ORDER — LORAZEPAM 2 MG/ML IJ SOLN
2.00 mg | Freq: Once | INTRAMUSCULAR | Status: AC
Start: 2011-09-13 — End: 2011-09-19
  Administered 2011-09-19: 2 mg via INTRAVENOUS
  Filled 2011-09-13: qty 1

## 2011-09-13 MED ORDER — HYDROMORPHONE HCL PF 1 MG/ML IJ SOLN
1.00 mg | Freq: Once | INTRAMUSCULAR | Status: DC
Start: 2011-09-13 — End: 2011-09-21
  Filled 2011-09-13: qty 1

## 2011-09-13 MED ORDER — LORAZEPAM 2 MG/ML IJ SOLN
1.00 mg | Freq: Once | INTRAMUSCULAR | Status: DC
Start: 2011-09-13 — End: 2011-09-13

## 2011-09-13 MED ORDER — MORPHINE SULFATE 4 MG/ML IJ/IV SOLN (WRAP)
4.00 mg | Freq: Four times a day (QID) | INTRAVENOUS | Status: AC | PRN
Start: 2011-09-13 — End: 2011-09-13
  Administered 2011-09-13: 4 mg via INTRAVENOUS
  Filled 2011-09-13: qty 1

## 2011-09-13 MED ORDER — ONDANSETRON HCL 4 MG/2ML IJ SOLN
4.00 mg | Freq: Four times a day (QID) | INTRAMUSCULAR | Status: AC | PRN
Start: 2011-09-13 — End: 2011-09-13

## 2011-09-13 MED ORDER — SODIUM CHLORIDE 0.9 % IV SOLN
INTRAVENOUS | Status: DC
Start: 2011-09-13 — End: 2011-09-21
  Administered 2011-09-17: 125 mL/h via INTRAVENOUS
  Administered 2011-09-20: 125 mL via INTRAVENOUS

## 2011-09-13 MED ORDER — LORAZEPAM 2 MG/ML IJ SOLN
0.50 mg | Freq: Three times a day (TID) | INTRAMUSCULAR | Status: DC | PRN
Start: 2011-09-13 — End: 2011-09-21
  Administered 2011-09-14 – 2011-09-21 (×7): 0.5 mg via INTRAVENOUS
  Filled 2011-09-13 (×8): qty 1

## 2011-09-13 MED ORDER — MORPHINE SULFATE 4 MG/ML IJ/IV SOLN (WRAP)
4.00 mg | INTRAVENOUS | Status: DC | PRN
Start: 2011-09-13 — End: 2011-09-13

## 2011-09-13 MED ORDER — ASPIRIN 325 MG PO TABS
325.00 mg | ORAL_TABLET | Freq: Once | ORAL | Status: AC
Start: 2011-09-13 — End: 2011-09-13
  Administered 2011-09-13: 325 mg via ORAL
  Filled 2011-09-13: qty 1

## 2011-09-13 MED ORDER — SODIUM CHLORIDE 0.9 % IV SOLN
100.00 mL/h | INTRAVENOUS | Status: DC
Start: 2011-09-13 — End: 2011-09-13
  Administered 2011-09-13: 100 mL/h via INTRAVENOUS

## 2011-09-13 NOTE — Progress Notes (Signed)
Received pt from ED, pt asleep, arousable, was able to ambulate to bed, oriented to unit and use of call light, bed, pt got up to bathroom and voided, attempted to do admission assessment and health history but pt very sleepy and upset, refused to answer questions, will try again.  Pt denies any CHP or SOB just hungry and thirsty, got pt apple juice but pt immediately went back to sleep.

## 2011-09-13 NOTE — Progress Notes (Signed)
0400- Pts hr-39, checked on pt, sound asleep, easily arousable, pt denies CHP or SOB, BP-129/80 hr-43 r-18 T98.3 O2sat 99% in ra.  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  0440-paged Dr. Lorel Monaco and notified her of pt hr 39-50's, VSS, Dr. Lorel Monaco just stated not to give any pain meds or benzodiazapenes for now and just continue to monitor.  0445-Pt. Hr-45, sleeping at this time, easily arousable, got up to use bathroom without problem, upset about being awoken, refused to give urine sample.

## 2011-09-13 NOTE — Progress Notes (Signed)
Pt does not have [place] needs at this time.

## 2011-09-13 NOTE — H&P (Signed)
ADMISSION HISTORY AND PHYSICAL EXAM    Date Time: 09/13/2011 1:05 PM  Patient Name: [name] [name],[name] [name]  Attending Physician: Dorathy Daft, MD    Assessment:   Active Problems:   Chest pain, unspecified   Abdominal  pain, other specified site   Nausea & vomiting   Substance abuse  elevated CPK   etoh abuse   Cocaine abuse   Tobacco abuse     Plan:   Admit to tele , continue serial enzymes ,  Stress test [name]/o cardiac etiology   Possibly cocaine related coronary spasm  S/p neg d.dimer and neg ct abdomen w/u in ED   Also sxs possibly from narcotic and substance withdrawal   continue pain and antiemetic meds     History of Present Illness:   [name] [name] is a 46 y/o male presents to ED c/o abd pain, with pressure-like and sharp CP, that started 1.5 hours PTA. Pt also notes N/V (vomited x1, 1.5 hours ago) and bilateral calf pain,  Had  Diarrhea. Earlier but is resolved now . Pt was recently admitted to [facility] for CP , but left AMA because pt had to go to work  Pt states he last used cocaine a couple days ago. A;lso relates using heroin couple of days ago   His last stress test was a year ago per pt.    Had recent echo at [facility] and was seen by card . But left before the test. See Dr Donley Redder note for detail.   Pt refusing to give uds      Past Medical History:     Past Medical History   Diagnosis Date   . Heart murmur        Past Surgical History:   No past surgical history on file.    Family History:   History reviewed. No pertinent family history.    Social History:     History     Social History   . Marital Status: Single     Spouse Name: N/A     Number of Children: N/A   . Years of Education: N/A     Social History Main Topics   . Smoking status: Current Everyday Smoker -- 1.0 packs/day for .5 years   . Smokeless tobacco: Not on file   . Alcohol Use: Yes      pt claims he "is not a drinker", admits to ETOH use tonight   . Drug Use: Yes     Special: Cocaine   . Sexually Active:      Other Topics Concern   . Not on  file     Social History Narrative   . No narrative on file       Allergies:   No Known Allergies    Medications:     Prescriptions prior to admission   Medication Sig   . ondansetron (ZOFRAN ODT) 4 MG disintegrating tablet Take 1 tablet (4 mg total) by mouth every 6 (six) hours as needed for Nausea.       Review of Systems:   A comprehensive review of systems was:  .Review of Systems   Constitutional: Negative for fever and chills.   Respiratory: Negative for cough, shortness of breath and wheezing.    Cardiovascular:as in HPI  Gastrointestinal: positive for nausea, vomiting, abdominal pain, diarrhea,    Genitourinary: Negative for dysuria.   Musculoskeletal: Negative for back pain , c/o leg pain    Skin: Negative for rash.  Neurological: Negative for dizziness, focal weakness, seizures and headaches.       Physical Exam:     Filed Vitals:    09/13/11 0405   BP: 129/80   Pulse: 43   Temp: 98.3 F (36.8 C)   Resp: 18   SpO2: 99%     .     Intake and Output Summary (Last 24 hours) at Date Time  No intake or output data in the 24 hours ending 09/13/11 1305    Physical Exam   Constitutional: Patient is oriented to person, place , sedated . No distress.   HENT:   Head: Normocephalic and atraumatic.   Mouth/Throat: Oropharynx is clear and moist.   Eyes: EOM are normal. Pupils are equal, round, and reactive to light.   Neck: Neck supple.   Cardiovascular: Normal rate and regular rhythm.    Pulmonary/Chest: Effort normal and breath sounds normal. No respiratory distress.   Abdominal: Soft. Bowel sounds are normal.   Musculoskeletal: Normal range of motion.   Neurological: no focal deficit , sl sedated at present , easily arousable      Labs:     Results     Procedure Component Value Units Date/Time    CK-MB [[id number]] Collected:09/13/11 0509     Creatinine Kinase MB (CKMB) 1.8 ng/mL Updated:09/13/11 0635    CK with MB if indicated [[id number]]  (Abnormal) Collected:09/13/11 0509    Specimen Information:Blood  Updated:09/13/11 0612     Creatine Kinase (CK) 241 (H) U/L     Troponin I [[id number]] Collected:09/13/11 0509    Specimen Information:Blood Updated:09/13/11 0603     Troponin I <0.01 ng/mL     MRSA culture [[id number]] Collected:09/13/11 0112    Specimen Information:Body Fluid / Nasal/Throat ASC Admission Updated:09/13/11 0407    B-type Natriuretic Peptide (BNP) [[id number]] Collected:09/12/11 1853    Specimen Information:Blood Updated:09/12/11 2122     B-Natriuretic Peptide 14 pg/mL     CK-MB [[id number]] Collected:09/12/11 1853     Creatinine Kinase MB (CKMB) 2.8 ng/mL Updated:09/12/11 2106    Troponin I [[id number]] Collected:09/12/11 1853    Specimen Information:Blood Updated:09/12/11 2029     Troponin I 0.01 ng/mL     Comprehensive metabolic panel [[id number]]  (Abnormal) Collected:09/12/11 1853    Specimen Information:Blood Updated:09/12/11 2023     Glucose 137 (H) mg/dL      BUN 11 mg/dL      Creatinine 0.9 mg/dL      Sodium 235 mEq/L      Potassium 3.5 mEq/L      Chloride 105 mEq/L      CO2 24 mEq/L      Calcium 8.6 mg/dL      Protein, Total 6.2 g/dL      Albumin 3.6 g/dL      AST (SGOT) 23 U/L      ALT 11 U/L      Alkaline Phosphatase 61 U/L      Bilirubin, Total 0.7 mg/dL      Globulin 2.6 g/dL      Albumin/Globulin Ratio 1.4     CK with MB if indicated [[id number]]  (Abnormal) Collected:09/12/11 1853    Specimen Information:Blood Updated:09/12/11 2023     Creatine Kinase (CK) 319 (H) U/L     GFR [[id number]] Collected:09/12/11 1853     EGFR >60.0 Updated:09/12/11 2023    D-Dimer [[id number]] Collected:09/12/11 1853     D-Dimer 0.38 ug/mL FEU Updated:09/12/11 2012    CBC with differential [[id number]]  (Abnormal) Collected:09/12/11 1853  Specimen Information:Blood Updated:09/12/11 2001     WBC 6.32 x10 3/uL      RBC 3.37 (L) x10 6/uL      Hgb 10.7 (L) g/dL      Hematocrit 16.1 (L) %      MCV 89.6 fL      MCH 31.8 pg      MCHC 35.4 g/dL      RDW 13 %      Platelets 167 x10 3/uL      MPV 9.6 fL      Neutrophils 57  %      Lymphocytes Automated 30 %      Monocytes 10 %      Eosinophils Automated 2 %      Basophils Automated 0 %      Immature Granulocyte 0 %      Nucleated RBC 0 /100 WBC      Neutrophils Absolute 3.63 x10 3/uL      Abs Lymph Automated 1.89 x10 3/uL      Abs Mono Automated 0.65 x10 3/uL      Abs Eos Automated 0.13 x10 3/uL      Absolute Baso Automated 0.02 x10 3/uL      Absolute Immature Granulocyte 0.01 x10 3/uL             Lab 09/12/11 1853 09/11/11 2325 09/10/11 0157   WBC 6.32 7.31 9.96   HGB 10.7* 11.3* 12.1*   HCT 30.2* 32.8* 34.7*   PLT 167 188 197       Lab 09/12/11 1853 09/11/11 2324 09/10/11 0240   NA 137 141 137   K 3.5 3.6 4.0   CL 105 105 103   CO2 24 22 25    BUN 11 12.0 12.0   CREAT 0.9 0.9 0.9   EGFR >60.0 >60.0 >60.0   GLU 137* 116* 114*   CA 8.6 9.3 8.6      [facility]   [address]   [place], [place] [address]   [phone]   Transthoracic Echocardiogram   2D, M-mode, Doppler, and Color Doppler   Study date: 10-Sep-2011   Patient: LEVITICUS HARTON   MR #: [id number]   Account #: [id number]   DOB: 1965-11-02   Age: 61 years   Gender: Male   Height:   Weight:   BSA:   BP: 102/ 53   Allergies: NO KNOWN ALLERGIES   Sonographer: Alexis Goodell   [facility]: [facility]   Reading Physician: Leamon Arnt, MD   CLINICAL QUESTION: Assess left ventricular function.   HISTORY: Symptoms: chest pain. PRIOR HISTORY: Abnormal ECG.   PROCEDURE: The transthoracic approach was used. The study included complete 2D   imaging, M-mode, complete spectral Doppler, and color Doppler.   IMPRESSIONS:   CONCLUSIONS:   1. NORMAL LEFT VENTRICULAR SIZE AND FUNCTION. EJECTION FRACTION 60%.   2. BIATRIAL DILATATION.   3. TRIVIAL AORTIC REGURGITATION.   4. BORDERLINE PULMONARY HYPERTENSION.   FINDINGS:           Rads:   Radiological Procedure reviewed. Radiology Results (24 Hour)     Procedure Component Value Units Date/Time    Chest 2 Views [[id number]] Collected:09/12/11 2217    Order  Status:Completed  Updated:09/12/11 2221    Narrative:    HISTORY:  Chest pain.     TECHNIQUE: Two views of the chest were obtained.      PRIORS: 09/11/2011.     FINDINGS:    The lung  [place] are clear. There are no pleural effusions. The cardiac  silhouette and hila are normal. The trachea is midline. The bony  structures are essentially unremarkable.       Impression:     No active lung disease.    CT Abd/Pelvis with IV Contrast [[id number]] Collected:09/12/11 2147    Order Status:Completed  Updated:09/12/11 2154    Narrative:    HISTORY: Right lower quadrant pain     TECHNIQUE: Enhanced, computed tomography of the abdomen and pelvis was  performed at 2.5 mm slice thickness. Marland Kitchen     PRIOR: 11/19/2009     FINDINGS:   The liver, spleen and pancreas are unremarkable. There is no dilatation  of the intra or extrahepatic bile ducts.   The gallbladder is unremarkable.   The gastrointestinal tract is within normal limits. The appendix is not  convincingly visualized. However there are no right lower quadrant  inflammatory changes. There is presence of nonobstructive, 4 mm left  renal stone.   Otherwise, the kidneys are unremarkable. The adrenal glands are normal.  The great vessels are normal in caliber. There is no evidence of  ascites.  The pelvis show  no evidence of pelvic masses or collection.  The urinary bladder is unremarkable.        Impression:     Nonobstructive left renal stone.          Signed by: Dorathy Daft, MD

## 2011-09-14 ENCOUNTER — Observation Stay: Payer: Medicaid Other

## 2011-09-14 ENCOUNTER — Inpatient Hospital Stay: Payer: Medicaid Other

## 2011-09-14 ENCOUNTER — Other Ambulatory Visit: Payer: Medicaid Other

## 2011-09-14 LAB — COMPREHENSIVE METABOLIC PANEL
ALT: 24 U/L (ref 0–55)
AST (SGOT): 35 U/L — ABNORMAL HIGH (ref 5–34)
Albumin/Globulin Ratio: 1.3 (ref 0.9–2.2)
Albumin: 3.4 g/dL — ABNORMAL LOW (ref 3.5–5.0)
Alkaline Phosphatase: 61 U/L (ref 40–150)
BUN: 10 mg/dL (ref 7–21)
Bilirubin, Total: 0.8 mg/dL (ref 0.2–1.2)
CO2: 26 mEq/L (ref 22–29)
Calcium: 8.7 mg/dL (ref 8.5–10.5)
Chloride: 107 mEq/L (ref 98–107)
Creatinine: 0.8 mg/dL (ref 0.7–1.3)
Globulin: 2.7 g/dL (ref 2.0–3.6)
Glucose: 90 mg/dL (ref 70–100)
Potassium: 4 mEq/L (ref 3.5–5.1)
Protein, Total: 6.1 g/dL (ref 6.0–8.3)
Sodium: 140 mEq/L (ref 136–145)

## 2011-09-14 LAB — ECG 12-LEAD
Atrial Rate: 78 {beats}/min
P Axis: 42 degrees
P-R Interval: 152 ms
Q-T Interval: 362 ms
QRS Duration: 82 ms
QTC Calculation (Bezet): 412 ms
R Axis: -36 degrees
T Axis: 11 degrees
Ventricular Rate: 78 {beats}/min

## 2011-09-14 LAB — CBC AND DIFFERENTIAL
Basophils Absolute Automated: 0.02 10*3/uL (ref 0.00–0.20)
Basophils Automated: 0 % (ref 0–2)
Eosinophils Absolute Automated: 0.14 10*3/uL (ref 0.00–0.70)
Eosinophils Automated: 2 % (ref 0–5)
Hematocrit: 31.6 % — ABNORMAL LOW (ref 42.0–52.0)
Hgb: 10.8 g/dL — ABNORMAL LOW (ref 13.0–17.0)
Immature Granulocytes Absolute: 0.01 10*3/uL
Immature Granulocytes: 0 % (ref 0–1)
Lymphocytes Absolute Automated: 2.03 10*3/uL (ref 0.50–4.40)
Lymphocytes Automated: 26 % (ref 15–41)
MCH: 30.5 pg (ref 28.0–32.0)
MCHC: 34.2 g/dL (ref 32.0–36.0)
MCV: 89.3 fL (ref 80.0–100.0)
MPV: 9.6 fL (ref 9.4–12.3)
Monocytes Absolute Automated: 0.66 10*3/uL (ref 0.00–1.20)
Monocytes: 8 % (ref 0–11)
Neutrophils Absolute: 4.95 10*3/uL (ref 1.80–8.10)
Neutrophils: 63 % (ref 52–75)
Nucleated RBC: 0 /100 WBC (ref 0–1)
Platelets: 181 10*3/uL (ref 140–400)
RBC: 3.54 10*6/uL — ABNORMAL LOW (ref 4.70–6.00)
RDW: 13 % (ref 12–15)
WBC: 7.8 10*3/uL (ref 3.50–10.80)

## 2011-09-14 LAB — CK: Creatine Kinase (CK): 168 U/L (ref 30–200)

## 2011-09-14 LAB — GFR: EGFR: >60

## 2011-09-14 LAB — TROPONIN I: Troponin I: 0.01 ng/mL (ref 0.00–0.09)

## 2011-09-14 LAB — LACTIC ACID, PLASMA: Lactic Acid: 0.8 mmol/L (ref 0.5–2.2)

## 2011-09-14 MED ORDER — TRAMADOL HCL 50 MG PO TABS
50.00 mg | ORAL_TABLET | Freq: Four times a day (QID) | ORAL | Status: DC | PRN
Start: 2011-09-14 — End: 2011-09-14
  Administered 2011-09-14: 50 mg via ORAL
  Filled 2011-09-14: qty 1

## 2011-09-14 MED ORDER — ONDANSETRON HCL 4 MG/2ML IJ SOLN
4.00 mg | Freq: Four times a day (QID) | INTRAMUSCULAR | Status: DC | PRN
Start: 2011-09-14 — End: 2011-09-21
  Administered 2011-09-18 – 2011-09-21 (×3): 4 mg via INTRAVENOUS
  Filled 2011-09-14 (×3): qty 2

## 2011-09-14 MED ORDER — HYDROMORPHONE HCL PF 1 MG/ML IJ SOLN
1.00 mg | INTRAMUSCULAR | Status: DC | PRN
Start: 2011-09-14 — End: 2011-09-15
  Administered 2011-09-14 – 2011-09-15 (×10): 1 mg via INTRAVENOUS
  Filled 2011-09-14 (×10): qty 1

## 2011-09-14 NOTE — Plan of Care (Signed)
Had a conversation with dr. Lorel Monaco regarding pt's status which is obs day2. Dr. Lorel Monaco said pt is still c/o lots of intractable abdominal pain and wouldn't be able to perform ST till it gets under control mostly over the weekend ST will be performed. Status has been changed to inpt.

## 2011-09-14 NOTE — Progress Notes (Signed)
PROGRESS NOTE       Date Time: 09/14/2011 10:28 AM  Patient Name: [name] [name],[name] [name]  Attending Physician: Dorathy Daft, MD    Assessment:   Active Problems:   Chest pain, unspecified   Abdominal  pain, other specified site   Nausea & vomiting   Substance abuse  elevated CPK   etoh abuse   Cocaine abuse   Tobacco abuse     Plan:     serial enzymes neg  ,  Stress test on hold sec to severe abdominal pain , resume stress once stable today or tomorrow   Exact etiology unclear , Possibly cocaine related ischemia vs other causes. Also possible narcotic seeking   S/p  neg ct abdomen w/u in ED yesterday but states pain is worse now .  Check KUB and serum lactate , Gi/ consult if needed , pain meds , repeat Ct  If needed    sxs also  possibly from narcotic and substance withdrawal   continue pain and antiemetic meds . Bowel regimen on narcotics     SUBJECTIVE:    Pt c/o severe pain abdomen , states he did not get any iv pain med since yesterday . ( was given ultram )  No vomiting , mild nausea . Had small BM today       Physical Exam:     Filed Vitals:    09/14/11 0526   BP: 123/75   Pulse: 60   Temp: 98.5 F (36.9 C)   Resp: 16   SpO2: 99%     .     Intake and Output Summary (Last 24 hours) at Date Time  No intake or output data in the 24 hours ending 09/14/11 1028    Physical Exam   Constitutional: Patient is oriented to person, place ,. No distress.   HENT:   Head: Normocephalic and atraumatic.   Mouth/Throat: Oropharynx is clear and moist.   Eyes: EOM are normal. Pupils are equal, round, and reactive to light.   Neck: Neck supple.   Cardiovascular:  regular rhythm.    Pulmonary/Chest: Effort normal and breath sounds normal. No respiratory distress.   Abdominal: guarding present , BS present, diffuse tenderness on palpation  Musculoskeletal: Normal range of motion.   Neurological: no focal deficit ,       Labs:     Results     Procedure Component Value Units Date/Time    Troponin I [[id number]] Collected:09/14/11 0512     Specimen Information:Blood Updated:09/14/11 0626     Troponin I 0.01 ng/mL     Creatine Kinase (CK) [[id number]] Collected:09/14/11 0512    Specimen Information:Blood Updated:09/14/11 0624     Creatine Kinase (CK) 168 U/L     MRSA culture [[id number]] Collected:09/13/11 0112    Specimen Information:Body Fluid / Nasal/Throat ASC Admission Updated:09/13/11 2358    Narrative:    ORDER#: [id number]                                    ORDERED BY: Claudette Head  SOURCE: Nasal/Throat ASC Admission                   COLLECTED:  09/13/11 01:12  ANTIBIOTICS AT COLL.:                                RECEIVED :  09/13/11  04:07  Culture MRSA Surveillance                  FINAL       09/13/11 23:58  09/13/11   No Methicillin resistant Staph aureus isolated.      Troponin I [[id number]] Collected:09/13/11 1638    Specimen Information:Blood Updated:09/13/11 1741     Troponin I 0.01 ng/mL     Troponin I [[id number]] Collected:09/13/11 1346    Specimen Information:Blood Updated:09/13/11 1414     Troponin I 0.02 ng/mL             Lab 09/12/11 1853 09/11/11 2325 09/10/11 0157   WBC 6.32 7.31 9.96   HGB 10.7* 11.3* 12.1*   HCT 30.2* 32.8* 34.7*   PLT 167 188 197       Lab 09/12/11 1853 09/11/11 2324 09/10/11 0240   NA 137 141 137   K 3.5 3.6 4.0   CL 105 105 103   CO2 24 22 25    BUN 11 12.0 12.0   CREAT 0.9 0.9 0.9   EGFR >60.0 >60.0 >60.0   GLU 137* 116* 114*   CA 8.6 9.3 8.6      [facility]   [address]   [place], [place] [address]   [phone]   Transthoracic Echocardiogram   2D, M-mode, Doppler, and Color Doppler   Study date: 10-Sep-2011   Patient: JAKIAH GOREE   MR #: [id number]   Account #: [id number]   DOB: October 18, 1965   Age: 51 years   Gender: Male   Height:   Weight:   BSA:   BP: 102/ 53   Allergies: NO KNOWN ALLERGIES   Sonographer: Alexis Goodell   [facility]   Reading Physician: Leamon Arnt, MD   CLINICAL QUESTION: Assess left ventricular function.   HISTORY: Symptoms:  chest pain. PRIOR HISTORY: Abnormal ECG.   PROCEDURE: The transthoracic approach was used. The study included complete 2D   imaging, M-mode, complete spectral Doppler, and color Doppler.   IMPRESSIONS:   CONCLUSIONS:   1. NORMAL LEFT VENTRICULAR SIZE AND FUNCTION. EJECTION FRACTION 60%.   2. BIATRIAL DILATATION.   3. TRIVIAL AORTIC REGURGITATION.   4. BORDERLINE PULMONARY HYPERTENSION.   FINDINGS:           Rads:   Radiological Procedure reviewed. Radiology Results (24 Hour)     ** No Results found for the last 24 hours. **          Signed by: Dorathy Daft, MD

## 2011-09-14 NOTE — Plan of Care (Signed)
A/ox3. Vss. Ambulatory. C/o abd pain 7-9/10. Failed to take stress test and Abd KUB. MD informed. X-ray switchd to portable. Pt to get 1mg  dilaudid iv prn for pain.

## 2011-09-14 NOTE — Progress Notes (Signed)
C/o pain to right abdomen.  Initially, refused any pain meds at shift start, while c/o pain of 7/10.  Later requested iv dilaudid.  Contacted md who ordered po tramadol -- patient has refused tramadol.  This am, continues to c/o pain 7/10 to abdomen.  Have offered warm compress, massage, position change and asked if there are any other pain relief techniques which work -- no.    Heart rate was at 39 and in low 40s when md contacted.  Md made aware.

## 2011-09-14 NOTE — Progress Notes (Shared)
Man with recurrent abdominal pain-has had hospital;admissions in past without a clear explanation found. Will review all and full note to follow.

## 2011-09-15 LAB — CK: Creatine Kinase (CK): 122 U/L (ref 30–200)

## 2011-09-15 MED ORDER — HYDROMORPHONE HCL PF 1 MG/ML IJ SOLN
3.00 mg | INTRAMUSCULAR | Status: DC | PRN
Start: 2011-09-15 — End: 2011-09-16
  Administered 2011-09-15 – 2011-09-16 (×8): 3 mg via INTRAVENOUS
  Filled 2011-09-15 (×8): qty 3

## 2011-09-15 NOTE — Progress Notes (Signed)
STATUS: PT ALERT AND ORIENTED X 3. PT C/O ABDOMEN PAIN WITH DILAUDID GIVEN AROUND THE CLOCK. NO RESPIRATORY DISTRESS NOTED. IVF UP AS ORDERED. PT IS SLEEPING AT THIS TIME. PLAN: WILL CONTINUE TO MONITOR FOR CHANGES.  PAIN WILL BE AT A TOLERABLE LEVEL PRIOR TO  D/C.

## 2011-09-15 NOTE — Progress Notes (Signed)
PROGRESS NOTE    Date Time: 09/15/2011 11:29 AM  Patient Name: [name] [name], [name] [name]      Subjective:   C/o Rt sided abdominal pain with some nausea.    Medications:     Current Facility-Administered Medications   Medication Dose Route Frequency   . HYDROmorphone  1 mg Intravenous Once   . LORazepam  2 mg Intravenous Once       Review of Systems:   A comprehensive review of systems was:   General ROS: negative  Psychological ROS: negative for - behavioral disorder or suicidal ideation  Ophthalmic ROS: negative for - blurry vision, double vision or loss of vision  ENT ROS: negative for - epistaxis  Hematological and Lymphatic ROS: negative for - blood clots, bruising or swollen lymph nodes  Respiratory ROS: no cough, shortness of breath, or wheezing  Cardiovascular ROS: no chest pain or dyspnea on exertion  Gastrointestinal ROS: (+)abdominal pain, no vomiting..change in bowel habits, or black or bloody stools  Genito-Urinary ROS: no dysuria, trouble voiding, or hematuria  Musculoskeletal ROS: negative for - joint pain or joint swelling  Neurological ROS: no TIA or stroke symptoms  Dermatological ROS: negative for rash      Physical Exam:     Filed Vitals:    09/15/11 0618   BP: 118/74   Pulse: 43   Temp: 98.1 F (36.7 C)   Resp: 20   SpO2: 98%     General appearance - alert, no distress and well hydrated  Mental status - alert, oriented to person, place, and time  Eyes - sclera anicteric  Nose - normal and patent, no erythema, discharge.  Mouth - mucous membranes moist.  Neck - supple, no JVD  Chest - clear to auscultation.  Heart - normal rate, regular rhythm, normal S1, S2.  Abdomen - soft, (+) tender to RUQ/RLQ, nondistended,bowel sounds normal.  Neurological - no focal deficit noted  Extremities - peripheral pulses normal, improved pedal edema. Left sided chronic varicose veins.      Labs:     Results     Procedure Component Value Units Date/Time    Creatine Kinase (CK) [[id number]] Collected:09/15/11 0506    Specimen  Information:Blood Updated:09/15/11 0542     Creatine Kinase (CK) 122 U/L     Comprehensive metabolic panel [[id number]]  (Abnormal) Collected:09/14/11 1121    Specimen Information:Blood Updated:09/14/11 1157     Glucose 90 mg/dL      BUN 10 mg/dL      Creatinine 0.8 mg/dL      Sodium 737 mEq/L      Potassium 4.0 mEq/L      Chloride 107 mEq/L      CO2 26 mEq/L      Calcium 8.7 mg/dL      Protein, Total 6.1 g/dL      Albumin 3.4 (L) g/dL      AST (SGOT) 35 (H) U/L      ALT 24 U/L      Alkaline Phosphatase 61 U/L      Bilirubin, Total 0.8 mg/dL      Globulin 2.7 g/dL      Albumin/Globulin Ratio 1.3     GFR [[id number]] Collected:09/14/11 1121     EGFR >60.0 Updated:09/14/11 1157    Lactic acid, plasma [[id number]] Collected:09/14/11 1121    Specimen Information:Blood Updated:09/14/11 1144     Lactic acid 0.8 mmol/L     CBC and differential [[id number]]  (Abnormal) Collected:09/14/11 1121    Specimen Information:Blood Updated:09/14/11 1130  WBC 7.80 x10 3/uL      RBC 3.54 (L) x10 6/uL      Hgb 10.8 (L) g/dL      Hematocrit 16.1 (L) %      MCV 89.3 fL      MCH 30.5 pg      MCHC 34.2 g/dL      RDW 13 %      Platelets 181 x10 3/uL      MPV 9.6 fL      Neutrophils 63 %      Lymphocytes Automated 26 %      Monocytes 8 %      Eosinophils Automated 2 %      Basophils Automated 0 %      Immature Granulocyte 0 %      Nucleated RBC 0 /100 WBC      Neutrophils Absolute 4.95 x10 3/uL      Abs Lymph Automated 2.03 x10 3/uL      Abs Mono Automated 0.66 x10 3/uL      Abs Eos Automated 0.14 x10 3/uL      Absolute Baso Automated 0.02 x10 3/uL      Absolute Immature Granulocyte 0.01 x10 3/uL               Rads:   Radiological Procedure reviewed.  Radiology Results (24 Hour)     Procedure Component Value Units Date/Time    XR Abdomen Portable [[id number]] Collected:09/14/11 1557    Order Status:Completed  Updated:09/14/11 1618    Narrative:    HISTORY: Abdominal pain     FINDINGS: KUB     There is increased volume of colonic stool. There is  no bowel  obstruction. There is no definite free intraperitoneal or  abnormal  calcification seen       Impression:     Unremarkable plain film                     Assessment/plan    Active Problems:   Chest pain, unspecified, ? Atypical.  Abdominal pain  Nausea & vomiting   Substance abuse         Stress test on hold sec to ongoing abdominal pain , resume stress once stable today or tomorrow   Exact etiology unclear , Possibly cocaine related ischemia vs other causes. ct abdomen w/u in ED unremarkable. KUB normal. , Gi consulted- full consult to follow. Repeat Ct If needed   continue pain and antiemetic meds         Signed by: Ida Rogue, MD

## 2011-09-15 NOTE — Plan of Care (Signed)
A/ox3. Vss. No sob. No resp or cardiac distress reported. C/o abd pain. MD informed. Pain meds increased to 3 mg dilaudid q3hr prn. Pain meds given as ordered. Encouraged pt to report any discomfort. P= continue monitoring GI status.

## 2011-09-16 LAB — CK: Creatine Kinase (CK): 109 U/L (ref 30–200)

## 2011-09-16 MED ORDER — HYDROMORPHONE HCL PF 1 MG/ML IJ SOLN
4.00 mg | INTRAMUSCULAR | Status: DC | PRN
Start: 2011-09-16 — End: 2011-09-16

## 2011-09-16 MED ORDER — HYDROMORPHONE HCL PF 4 MG/ML IJ SOLN
4.00 mg | INTRAMUSCULAR | Status: DC | PRN
Start: 2011-09-16 — End: 2011-09-16
  Filled 2011-09-16: qty 1

## 2011-09-16 MED ORDER — HYDROMORPHONE HCL PF 4 MG/ML IJ SOLN
4.00 mg | INTRAMUSCULAR | Status: DC | PRN
Start: 2011-09-16 — End: 2011-09-18
  Administered 2011-09-16 – 2011-09-18 (×10): 4 mg via INTRAVENOUS
  Filled 2011-09-16 (×10): qty 1

## 2011-09-16 NOTE — Progress Notes (Signed)
PROGRESS NOTE    Date Time: 09/16/2011 12:32 PM  Patient Name: [name] [name], [name] [name]      Subjective:   C/o Rt sided abdominal pain with some nausea, but tolerating diet.    Medications:     Current Facility-Administered Medications   Medication Dose Route Frequency   . HYDROmorphone  1 mg Intravenous Once   . LORazepam  2 mg Intravenous Once       Review of Systems:   A comprehensive review of systems was:   General ROS: negative  Psychological ROS: negative for - behavioral disorder or suicidal ideation  Ophthalmic ROS: negative for - blurry vision, double vision or loss of vision  ENT ROS: negative for - epistaxis  Hematological and Lymphatic ROS: negative for - blood clots, bruising or swollen lymph nodes  Respiratory ROS: no cough, shortness of breath, or wheezing  Cardiovascular ROS: no chest pain or dyspnea on exertion  Gastrointestinal ROS: (+)abdominal pain, no vomiting..change in bowel habits, or black or bloody stools  Genito-Urinary ROS: no dysuria, trouble voiding, or hematuria  Musculoskeletal ROS: negative for - joint pain or joint swelling  Neurological ROS: no TIA or stroke symptoms  Dermatological ROS: negative for rash      Physical Exam:     Filed Vitals:    09/16/11 0616   BP: 127/77   Pulse: 47   Temp: 98.2 F (36.8 C)   Resp: 20   SpO2: 98%     General appearance - alert, no distress and well hydrated  Mental status - alert, oriented to person, place, and time  Eyes - sclera anicteric  Nose - normal and patent, no erythema, discharge.  Mouth - mucous membranes moist.  Neck - supple, no JVD  Chest - clear to auscultation.  Heart - normal rate, regular rhythm, normal S1, S2.  Abdomen - soft, (+) tender to RUQ/RLQ, nondistended,bowel sounds normal.  Neurological - no focal deficit noted  Extremities - peripheral pulses normal, improved pedal edema. Left sided chronic varicose veins.      Labs:     Results     Procedure Component Value Units Date/Time    Creatine Kinase (CK) [[id number]]  Collected:09/16/11 0545    Specimen Information:Blood Updated:09/16/11 0709     Creatine Kinase (CK) 109 U/L               Rads:   Radiological Procedure reviewed.  Radiology Results (24 Hour)     ** No Results found for the last 24 hours. **                     Assessment/plan    Chest pain, unspecified, ? Atypical. Resolved.  Abdominal pain-Rt sided  Nausea & vomiting   Substance abuse         Stress test on hold sec to ongoing abdominal pain , resume stress once stable today or tomorrow. Last Normal myocardial perfusion scan with LVEF equals 55% on 11/25/2008  per records. Exact etiology  for his abd pain still unclear , Possibly cocaine related ischemia vs other causes. Can not [name]/o Narcotic seeking behavior. ct abdomen w/u in ED unremarkable. KUB normal. , Gi consulted- full consult to follow. Repeat Ct If needed. Continue current plan of care.        Signed by: Ida Rogue, MD

## 2011-09-16 NOTE — Progress Notes (Signed)
Pt alert and oriented X3, no respiratory distress noted, complains of abdominal pain of 8/10, dilaudid given prn, iv fluid infusing , will continue to monitor pt

## 2011-09-16 NOTE — Plan of Care (Signed)
A/ox3. Vss. Ambulatory. No sob. No resp or cardiac distress reported. Tele= SB. C/O abd pain and pain not well controlled. Dr patel informed. 4mg  dilaudid iv q4hr ordered prn. Tolerating diet. Pt still stopping and taking off IV tubing. Educated pt about risks of infection and to stuff to saline lock the line if needed. Encouraged pt to report any discomfort.

## 2011-09-17 ENCOUNTER — Inpatient Hospital Stay: Payer: Medicaid Other

## 2011-09-17 LAB — CBC AND DIFFERENTIAL
Basophils Absolute Automated: 0.03 10*3/uL (ref 0.00–0.20)
Basophils Automated: 0 % (ref 0–2)
Eosinophils Absolute Automated: 0.31 10*3/uL (ref 0.00–0.70)
Eosinophils Automated: 4 % (ref 0–5)
Hematocrit: 30.1 % — ABNORMAL LOW (ref 42.0–52.0)
Hgb: 10.4 g/dL — ABNORMAL LOW (ref 13.0–17.0)
Immature Granulocytes Absolute: 0.01 10*3/uL
Immature Granulocytes: 0 % (ref 0–1)
Lymphocytes Absolute Automated: 2.77 10*3/uL (ref 0.50–4.40)
Lymphocytes Automated: 36 % (ref 15–41)
MCH: 30.8 pg (ref 28.0–32.0)
MCHC: 34.6 g/dL (ref 32.0–36.0)
MCV: 89.1 fL (ref 80.0–100.0)
MPV: 10.3 fL (ref 9.4–12.3)
Monocytes Absolute Automated: 0.78 10*3/uL (ref 0.00–1.20)
Monocytes: 10 % (ref 0–11)
Neutrophils Absolute: 3.89 10*3/uL (ref 1.80–8.10)
Neutrophils: 50 % — ABNORMAL LOW (ref 52–75)
Nucleated RBC: 0 /100 WBC (ref 0–1)
Platelets: 184 10*3/uL (ref 140–400)
RBC: 3.38 10*6/uL — ABNORMAL LOW (ref 4.70–6.00)
RDW: 13 % (ref 12–15)
WBC: 7.78 10*3/uL (ref 3.50–10.80)

## 2011-09-17 LAB — COMPREHENSIVE METABOLIC PANEL
ALT: 15 U/L (ref 0–55)
AST (SGOT): 18 U/L (ref 5–34)
Albumin/Globulin Ratio: 1.3 (ref 0.9–2.2)
Albumin: 3.2 g/dL — ABNORMAL LOW (ref 3.5–5.0)
Alkaline Phosphatase: 52 U/L (ref 40–150)
BUN: 6 mg/dL — ABNORMAL LOW (ref 7–21)
Bilirubin, Total: 0.3 mg/dL (ref 0.2–1.2)
CO2: 28 mEq/L (ref 22–29)
Calcium: 8.4 mg/dL — ABNORMAL LOW (ref 8.5–10.5)
Chloride: 107 mEq/L (ref 98–107)
Creatinine: 0.8 mg/dL (ref 0.7–1.3)
Globulin: 2.5 g/dL (ref 2.0–3.6)
Glucose: 91 mg/dL (ref 70–100)
Potassium: 3.8 mEq/L (ref 3.5–5.1)
Protein, Total: 5.7 g/dL — ABNORMAL LOW (ref 6.0–8.3)
Sodium: 140 mEq/L (ref 136–145)

## 2011-09-17 LAB — CK: Creatine Kinase (CK): 98 U/L (ref 30–200)

## 2011-09-17 LAB — GFR: EGFR: >60

## 2011-09-17 MED ORDER — TECHNETIUM TC 99M TETROFOSMIN INJECTION
1.00 | Freq: Once | Status: AC | PRN
Start: 2011-09-17 — End: 2011-09-17
  Administered 2011-09-17: 1 via INTRAVENOUS
  Filled 2011-09-17: qty 1

## 2011-09-17 MED ORDER — THALLOUS CHLORIDE TL 201 1 MCI/ML IV SOLN
3.00 | Freq: Once | INTRAVENOUS | Status: AC | PRN
Start: 2011-09-17 — End: 2011-09-17
  Administered 2011-09-17: 3 via INTRAVENOUS

## 2011-09-17 MED ORDER — REGADENOSON 0.4 MG/5ML IV SOLN
INTRAVENOUS | Status: AC
Start: 2011-09-17 — End: 2011-09-17
  Filled 2011-09-17: qty 5

## 2011-09-17 NOTE — Progress Notes (Signed)
Pt alert and oriented x3, no respiratory distress noted, dilaudid given for abdominal pain prn,states medication not helping with pain,  iv fluid infusing, will continue to monitor

## 2011-09-17 NOTE — Progress Notes (Signed)
PROGRESS NOTE    Date Time: 09/17/2011 6:07 PM  Patient Name: [name] [name], [name] [name]      Subjective:   C/o  abdominal pain with some nausea, but tolerating diet.    Medications:     Current Facility-Administered Medications   Medication Dose Route Frequency   . HYDROmorphone  1 mg Intravenous Once   . LORazepam  2 mg Intravenous Once   . regadenoson           Review of Systems:   A comprehensive review of systems was:   General ROS: negative  Psychological ROS: negative for - behavioral disorder or suicidal ideation  Ophthalmic ROS: negative for - blurry vision, double vision or loss of vision  ENT ROS: negative for - epistaxis  Hematological and Lymphatic ROS: negative for - blood clots, bruising or swollen lymph nodes  Respiratory ROS: no cough, shortness of breath, or wheezing  Cardiovascular ROS: no chest pain or dyspnea on exertion  Gastrointestinal ROS: (+)abdominal pain, no vomiting..change in bowel habits, or black or bloody stools  Genito-Urinary ROS: no dysuria, trouble voiding, or hematuria  Musculoskeletal ROS: negative for - joint pain or joint swelling  Neurological ROS: no TIA or stroke symptoms  Dermatological ROS: negative for rash      Physical Exam:     Filed Vitals:    09/17/11 1604   BP: 139/81   Pulse: 53   Temp: 98.7 F (37.1 C)   Resp: 16   SpO2: 100%     General appearance - alert, no distress and well hydrated  Mental status - alert, oriented to person, place, and time  Eyes - sclera anicteric  Nose - normal and patent, no erythema, discharge.  Mouth - mucous membranes moist.  Neck - supple, no JVD  Chest - clear to auscultation.  Heart - normal rate, regular rhythm, normal S1, S2.  Abdomen - soft, (+) tender to RUQ/RLQ, nondistended,bowel sounds normal.  Neurological - no focal deficit noted  Extremities - peripheral pulses normal, improved pedal edema. Left sided chronic varicose veins.      Labs:     Results     Procedure Component Value Units Date/Time    Creatine Kinase (CK) [[id number]]  Collected:09/17/11 0602    Specimen Information:Blood Updated:09/17/11 0743     Creatine Kinase (CK) 98 U/L     Comprehensive metabolic panel [[id number]]  (Abnormal) Collected:09/17/11 0602    Specimen Information:Blood Updated:09/17/11 0743     Glucose 91 mg/dL      BUN 6 (L) mg/dL      Creatinine 0.8 mg/dL      Sodium 782 mEq/L      Potassium 3.8 mEq/L      Chloride 107 mEq/L      CO2 28 mEq/L      Calcium 8.4 (L) mg/dL      Protein, Total 5.7 (L) g/dL      Albumin 3.2 (L) g/dL      AST (SGOT) 18 U/L      ALT 15 U/L      Alkaline Phosphatase 52 U/L      Bilirubin, Total 0.3 mg/dL      Globulin 2.5 g/dL      Albumin/Globulin Ratio 1.3     GFR [[id number]] Collected:09/17/11 0602     EGFR >60.0 Updated:09/17/11 0743    CBC and differential [[id number]]  (Abnormal) Collected:09/17/11 0602    Specimen Information:Blood Updated:09/17/11 0636     WBC 7.78 x10 3/uL  RBC 3.38 (L) x10 6/uL      Hgb 10.4 (L) g/dL      Hematocrit 91.4 (L) %      MCV 89.1 fL      MCH 30.8 pg      MCHC 34.6 g/dL      RDW 13 %      Platelets 184 x10 3/uL      MPV 10.3 fL      Neutrophils 50 (L) %      Lymphocytes Automated 36 %      Monocytes 10 %      Eosinophils Automated 4 %      Basophils Automated 0 %      Immature Granulocyte 0 %      Nucleated RBC 0 /100 WBC      Neutrophils Absolute 3.89 x10 3/uL      Abs Lymph Automated 2.77 x10 3/uL      Abs Mono Automated 0.78 x10 3/uL      Abs Eos Automated 0.31 x10 3/uL      Absolute Baso Automated 0.03 x10 3/uL      Absolute Immature Granulocyte 0.01 x10 3/uL               Assessment/plan    Chest pain, unspecified, ? Atypical. Resolved. But recurrent admissions with above ,, so will go ahead with the stress test   Abdominal pain-   Nausea & vomiting   Substance abuse          Last Normal myocardial perfusion scan with LVEF equals 55% on 11/25/2008  per records.     Exact etiology  for his abd pain still unclear , Possibly cocaine related ischemia vs other causes. Can not [name]/o Narcotic seeking  behavior. ct abdomen w/u in ED unremarkable. KUB reviewed . ,d/w  Gi , Dr Dalbert Mayotte was consulted  , will d/w him further plan of care .   D/c iv narcotics , change to Po pain meds f/u , bowel management     . Continue current plan of care.        Signed by: Dorathy Daft, MD

## 2011-09-18 LAB — CK: Creatine Kinase (CK): 99 U/L (ref 30–200)

## 2011-09-18 MED ORDER — HYDROMORPHONE HCL PF 1 MG/ML IJ SOLN
1.00 mg | Freq: Three times a day (TID) | INTRAMUSCULAR | Status: DC | PRN
Start: 2011-09-18 — End: 2011-09-18
  Administered 2011-09-18: 1 mg via INTRAVENOUS
  Filled 2011-09-18: qty 1

## 2011-09-18 MED ORDER — HYDROMORPHONE HCL PF 1 MG/ML IJ SOLN
2.00 mg | Freq: Four times a day (QID) | INTRAMUSCULAR | Status: DC | PRN
Start: 2011-09-18 — End: 2011-09-18

## 2011-09-18 MED ORDER — BISACODYL 10 MG RE SUPP
10.00 mg | Freq: Every day | RECTAL | Status: DC | PRN
Start: 2011-09-18 — End: 2011-09-21

## 2011-09-18 MED ORDER — HYDROMORPHONE HCL 2 MG PO TABS
2.00 mg | ORAL_TABLET | Freq: Four times a day (QID) | ORAL | Status: DC | PRN
Start: 2011-09-18 — End: 2011-09-18
  Administered 2011-09-18: 2 mg via ORAL
  Filled 2011-09-18: qty 1

## 2011-09-18 MED ORDER — HYDROMORPHONE HCL PF 1 MG/ML IJ SOLN
2.00 mg | Freq: Four times a day (QID) | INTRAMUSCULAR | Status: AC | PRN
Start: 2011-09-18 — End: 2011-09-19
  Administered 2011-09-18 – 2011-09-19 (×2): 2 mg via INTRAVENOUS
  Filled 2011-09-18 (×2): qty 2

## 2011-09-18 MED ORDER — OXYCODONE-ACETAMINOPHEN 5-325 MG PO TABS
1.00 | ORAL_TABLET | ORAL | Status: DC | PRN
Start: 2011-09-18 — End: 2011-09-21
  Administered 2011-09-19: 1 via ORAL
  Filled 2011-09-18 (×3): qty 1

## 2011-09-18 MED ORDER — CLONIDINE HCL 0.1 MG/24HR TD PTWK
1.00 | MEDICATED_PATCH | TRANSDERMAL | Status: DC
Start: 2011-09-18 — End: 2011-09-19
  Administered 2011-09-19: 1 via TRANSDERMAL
  Filled 2011-09-18: qty 1

## 2011-09-18 MED ORDER — POLYETHYLENE GLYCOL 3350 17 G PO PACK
17.00 g | PACK | Freq: Every day | ORAL | Status: DC
Start: 2011-09-18 — End: 2011-09-21
  Administered 2011-09-19 – 2011-09-21 (×2): 17 g via ORAL
  Filled 2011-09-18 (×3): qty 1

## 2011-09-18 MED ORDER — HYDROMORPHONE HCL 2 MG PO TABS
2.00 mg | ORAL_TABLET | ORAL | Status: DC | PRN
Start: 2011-09-18 — End: 2011-09-21
  Administered 2011-09-19 – 2011-09-21 (×8): 2 mg via ORAL
  Filled 2011-09-18 (×10): qty 1

## 2011-09-18 MED ORDER — HALOPERIDOL LACTATE 5 MG/ML IJ SOLN
5.00 mg | Freq: Four times a day (QID) | INTRAMUSCULAR | Status: DC | PRN
Start: 2011-09-18 — End: 2011-09-21
  Administered 2011-09-19 – 2011-09-20 (×2): 5 mg via INTRAMUSCULAR
  Filled 2011-09-18 (×2): qty 1

## 2011-09-18 MED ORDER — HYDROMORPHONE HCL PF 1 MG/ML IJ SOLN
2.00 mg | INTRAMUSCULAR | Status: DC | PRN
Start: 2011-09-18 — End: 2011-09-18
  Administered 2011-09-18 (×2): 2 mg via INTRAVENOUS
  Filled 2011-09-18 (×3): qty 2

## 2011-09-18 MED ORDER — CLONIDINE HCL 0.1 MG PO TABS
0.10 mg | ORAL_TABLET | Freq: Four times a day (QID) | ORAL | Status: DC
Start: 2011-09-19 — End: 2011-09-19
  Administered 2011-09-19: 0.1 mg via ORAL
  Filled 2011-09-18: qty 1

## 2011-09-18 NOTE — Progress Notes (Signed)
PROGRESS NOTE    Date Time: 09/18/2011 4:53 PM  Patient Name: [name] [name], [name] [name]      Subjective:   C/o severe  abdominal pain  . Relates more pain after dilaudid iv stopped , states does street heroin , getting withdrawals.   Had BM yest     Medications:     Current Facility-Administered Medications   Medication Dose Route Frequency   . HYDROmorphone  1 mg Intravenous Once   . LORazepam  2 mg Intravenous Once   . polyethylene glycol  17 g Oral Daily       Review of Systems:   A comprehensive review of systems was:   General ROS: negative  Psychological ROS: negative for - behavioral disorder or suicidal ideation  Ophthalmic ROS: negative for - blurry vision, double vision or loss of vision  ENT ROS: negative for - epistaxis  Hematological and Lymphatic ROS: negative for - blood clots, bruising or swollen lymph nodes  Respiratory ROS: no cough, shortness of breath, or wheezing  Cardiovascular ROS: no chest pain or dyspnea on exertion  Gastrointestinal ROS: (+)abdominal pain, no vomiting..change in bowel habits, or black or bloody stools  Genito-Urinary ROS: no dysuria, trouble voiding, or hematuria  Musculoskeletal ROS: negative for - joint pain or joint swelling  Neurological ROS: no TIA or stroke symptoms  Dermatological ROS: negative for rash      Physical Exam:     Filed Vitals:    09/18/11 1518   BP: 161/79   Pulse: 50   Temp: 98.3 F (36.8 C)   Resp: 18   SpO2: 100%     General appearance - yelling and screaming  Mental status - alert, oriented to person, place, and time  Eyes - sclera anicteric  Nose - normal and patent, no erythema, discharge.  Mouth - mucous membranes moist.  Neck - supple, no JVD  Chest - clear to auscultation.  Heart - normal rate, regular rhythm, normal S1, S2.  Abdomen - soft, (+) tender to RUQ/RLQ, nondistended,bowel sounds normal.  Neurological - no focal deficit noted  Extremities - peripheral pulses normal, improved pedal edema. Left sided chronic varicose veins.      Labs:     Results      Procedure Component Value Units Date/Time    Creatine Kinase (CK) [[id number]] Collected:09/18/11 0528    Specimen Information:Blood Updated:09/18/11 0648     Creatine Kinase (CK) 99 U/L               Assessment/plan    Chest pain, unspecified, ? Atypical. Resolved. But recurrent admissions with above so had the  stress test done , neg for ischemia   Abdominal pain-with recurrent abdominal pain-has had hospital;admissions in past without a clear explanation found.  Bradycardia , chronic , sinus , no pauses , avoid AVB drugs and clonidine   Nausea & vomiting ,tolerating diet   Substance abuse          Last Normal myocardial perfusion scan with LVEF equals 55% on 11/25/2008  per records.     Exact etiology  for his abd pain still unclear , Likely  Narcotic seeking behavior. ct abdomen w/u in ED unremarkable. KUB reviewed . ,d/w  Gi , Dr Dalbert Mayotte was consulted  , will d/w him further plan of care . He will again see pt   Dec iv narcotics , change to Po pain meds f/u , bowel management   D/w Dr Westley Gambles who will see the pt and make recommendations.  Discharge planning     . Continue current plan of care.        Signed by: Dorathy Daft, MD

## 2011-09-18 NOTE — Progress Notes (Signed)
Still in pain. Plan EGD tomorrow.

## 2011-09-18 NOTE — Progress Notes (Signed)
Patient is a/o x 3. Complain of abdominal pain and nausea 10/10. Screaming, moaning very loud and  Vomiting. Offered  Percocet since order for diluded was [place]. Patient refused and stated "it does not help me". Md requested to Start IV dilauded as patient said nothing else helps him. Case manager, PCD, and charge nurse are aware. After requesting many times to MD. A couple of doses of dilauded iv were given as per MD's order.  Antianxiety medication was given since patient was very anxious, sweating, and  screening.  Patient stated " I was using street drugs and and right now I am going through withdrawal symptoms."  MD made aware.  Patient is on tele and running sinus brady HR of 34. MD notified. No new orders were given. In the evening patient stayed in the room and stopped moaning. Safety measures in place. Patient is resting comfortably at this time. Will continue to manage pain and nausea and will continue with the plan of care.

## 2011-09-18 NOTE — Consults (Signed)
CONSULTATION    Date Time: 09/18/2011 8:37 PM  Patient Name: [name] [name],[name] [name]  Requesting Physician: Dorathy Daft, MD  Service Date: 09/18/2011     Patient Type: I     CONSULTING PHYSICIAN: London Pepper MD     REFERRING PHYSICIAN:      REASON FOR CONSULTATION:  Psychiatric assessment and management of patient, causing a disturbance on  3B after his intravenous and oral Dilaudid dosages have been reduced.       DIAGNOSTIC IMPRESSION:  AXIS I:  1.  Acute narcotic withdrawal.  2.  Narcotic addiction.  3.  Cocaine abuse.  4.  Mixed substance abuse.  AXIS II:  Personality disorder, not otherwise specified.  AXIS III:  1.  Gastrointestinal pains.  2.  Abdominal pains.  3.  Pain related to narcotic withdrawal.  4.  Chest pain.  5.  History of cardiac murmur.  AXIS IV:  Moderate to severe.  AXIS V:  45.     RECOMMENDATIONS:  1.  I would continue tapering the patient's narcotics as rapidly as  possible.   2.  I have started the patient on clonidine to minimize his withdrawal  symptoms.   3.  The patient has agreed to a more rapid taper of narcotics with  suppression of withdrawal symptoms.    4.  I have reduced the frequency of the patient's intravenous Dilaudid, and  ordered its discontinuation tomorrow and increased the frequency of the  patient's oral Dilaudid for at least the next 24 hours.  The patient seems  satisfied with a 50% reduction in dosage tomorrow.  I would discontinue  narcotics entirely within the next 24 to 36 hours.    5.  I have started the patient on clonidine, both orally and as transdermal  patch and this should handle the patient's withdrawal.  6.  If the patient becomes agitated, I would call security and administer  the Haldol 5 mg I have ordered.       Thank you for this interesting consultation.  I will follow the patient  with you in the hospital pending his discharge.  My recommendation is that  the patient be discharged as rapidly as possible as the only reason he is  remaining in the  hospital is to continue obtaining narcotics.       Thank you for this interesting consultation.       HISTORY OF PRESENT ILLNESS:  This 46 year old African American male who was hospitalized for complaints  of abdominal pain.  He had previously been seen on the 15th, two days  previously at [facility] where a urine drug screen was  positive for opiates and cocaine.       However, the patient refused to give a urine drug screen when he presented  in the emergency room.      The patient has had a full medical evaluation for abdominal pain and chest  pain, but has unfortunately been started on Dilaudid intravenously and  orally at fairly high doses.     When the patient's Dilaudid was being tapered today for the first time, he  became acutely agitated, began yelling and screaming and complaining  bitterly of pain to the degree that the entire unit was disrupted.     The patient's Dilaudid was increased to 2 mg q.4 hours and this appeared to  appease the patient, however, he continued to grown and complain of severe  pain.  Psychiatric consultation was requested.  PAST PSYCHIATRIC HISTORY:  The patient states he has had multiple episodes of detoxification and  long-term narcotic treatment, beginning in 1997 and ending 1-1/2 years ago  when the patient spent 6 months in the [facility] narcotic treatment  program.  The patient has had no formal psychiatric treatment.     SOCIAL HISTORY:  The patient has been using narcotics for well over 20 years, and has also  been abusing cocaine intermittently during that period of time.  There is  no history of alcoholism.  There is no history of previous psychiatric  treatment.     FAMILY HISTORY:  Negative for psychiatric problems.  Positive for substance abuse.     PAST MEDICAL HISTORY:  The patient denies a history of significant medical problems except for  "gastroenteritis" which he states requires treatment with narcotics.  The  patient also states that  he has "heart problems" which consist of a heart  murmur diagnosed in childhood.  The patient denies any other significant  medical problems.     PERSONAL HISTORY:  The patient is originally from [place].  He currently works as a  Arboriculturist.  He has a girlfriend in [place] and a sister and who also lives  in [place].  The patient has informed neither of these close associates of  his hospitalization at our facility and they have not visited him.       The patient reports that prior to admission, he was snorting heroin on a  more or less regular basis.     MENTAL STATUS EXAMINATION:  The patient was a thin African American male.  He was approached in the  hospital room, at which time he had his head covered.  However, he  ultimately consented to the interview when limits were set and he was asked  to cooperate.  During most of the interview, the patient's mood was calm.   Speech was logical, coherent, relevant, and appropriate to the subject at  hand.  There was no moaning, groaning, or complaint of pain.  These only  came up when the issue of reducing the patient's narcotics were discussed.     Again, the patient's pain complaints appeared to dissipate rapidly once he  was engaged in conversation.  The patient gave the above history in  response to his direct questions.  The patient's speech was logical,  coherent, relevant, and appropriate to the subject at hand.  While speaking  quietly to the examining psychiatrist, the patient did not _____ move  around or complain of abdominal pain.  This began immediately prior to the  interview, but after the discussion with the patient in which he was  advised that noisy behavior would not be tolerated, the patient quieted  down.  There were no involuntary motor movements, tremors or rigidity.  The  patient was fully alert, oriented, with intact recent and remote memory and  the ability to fully attend to the conversation.     The patient declined admission to the  psychiatric unit for purposes of  detoxification.  The patient's vital signs and medications are shown below.           D:  09/18/2011 20:47 PM by Dr. London Pepper, MD [phone])  T:  09/18/2011 21:17 PM by [id number]      Everlean Cherry: [id number]) (Doc ID: [id number])    Please see dictation of 09/18/2011 #[id number]      History:   [name] [name] is a 46 y.o. male  who presents to the hospital on 09/12/2011 with      Past Medical History:     Past Medical History   Diagnosis Date   . Heart murmur        Past Surgical History:   No past surgical history on file.    Family History:   History reviewed. No pertinent family history.    Social History:     History     Social History   . Marital Status: Single     Spouse Name: N/A     Number of Children: N/A   . Years of Education: N/A     Social History Main Topics   . Smoking status: Current Everyday Smoker -- 1.0 packs/day for .5 years   . Smokeless tobacco: Not on file   . Alcohol Use: Yes      pt claims he "is not a drinker", admits to ETOH use tonight   . Drug Use: Yes     Special: Cocaine   . Sexually Active:      Other Topics Concern   . Not on file     Social History Narrative   . No narrative on file       Allergies:   No Known Allergies    Medications:     Current Facility-Administered Medications   Medication Dose Route Frequency   . HYDROmorphone  1 mg Intravenous Once   . LORazepam  2 mg Intravenous Once   . polyethylene glycol  17 g Oral Daily       Review of Systems:   A comprehensive review of systems was:     Physical Exam:     Filed Vitals:    09/18/11 1518   BP: 161/79   Pulse: 50   Temp: 98.3 F (36.8 C)   Resp: 18   SpO2: 100%       Intake and Output Summary (Last 24 hours) at Date Time  No intake or output data in the 24 hours ending 09/18/11 2037    Labs Reviewed:     Results     Procedure Component Value Units Date/Time    Creatine Kinase (CK) [[id number]] Collected:09/18/11 0528    Specimen Information:Blood Updated:09/18/11 0648     Creatine Kinase (CK) 99 U/L            Signed by: London Pepper, MD

## 2011-09-18 NOTE — Progress Notes (Signed)
Pt awake c/o abdominal pain, medicated with dilaudid 4 mg iv for pain management. No distress noted. IVF maintained. Call light in reach. Will continue to monitor for pain.

## 2011-09-18 NOTE — Progress Notes (Signed)
Spoke to [place] ph [phone], at [facility], substance abuse rehab,  [facility]  does not take [place] MEDICAID, it will be self pay and deposit of $5000. Is due prior to transfer to inpatient.  Pt does not  have the fund.

## 2011-09-18 NOTE — Consults (Signed)
Service Date: 09/18/2011     Patient Type: I     CONSULTING PHYSICIAN: London Pepper MD     REFERRING PHYSICIAN:      REASON FOR CONSULTATION:  Psychiatric assessment and management of patient, causing a disturbance on  3B after his intravenous and oral Dilaudid dosages have been reduced.       DIAGNOSTIC IMPRESSION:  AXIS I:  1.  Acute narcotic withdrawal.  2.  Narcotic addiction.  3.  Cocaine abuse.  4.  Mixed substance abuse.  AXIS II:  Personality disorder, not otherwise specified.  AXIS III:  1.  Gastrointestinal pains.  2.  Abdominal pains.  3.  Pain related to narcotic withdrawal.  4.  Chest pain.  5.  History of cardiac murmur.  AXIS IV:  Moderate to severe.  AXIS V:  45.     RECOMMENDATIONS:  1.  I would continue tapering the patient's narcotics as rapidly as  possible.   2.  I have started the patient on clonidine to minimize his withdrawal  symptoms.   3.  The patient has agreed to a more rapid taper of narcotics with  suppression of withdrawal symptoms.    4.  I have reduced the frequency of the patient's intravenous Dilaudid, and  ordered its discontinuation tomorrow and increased the frequency of the  patient's oral Dilaudid for at least the next 24 hours.  The patient seems  satisfied with a 50% reduction in dosage tomorrow.  I would discontinue  narcotics entirely within the next 24 to 36 hours.    5.  I have started the patient on clonidine, both orally and as transdermal  patch and this should handle the patient's withdrawal.  6.  If the patient becomes agitated, I would call security and administer  the Haldol 5 mg I have ordered.       Thank you for this interesting consultation.  I will follow the patient  with you in the hospital pending his discharge.  My recommendation is that  the patient be discharged as rapidly as possible as the only reason he is  remaining in the hospital is to continue obtaining narcotics.       Thank you for this interesting consultation.       HISTORY OF PRESENT  ILLNESS:  This 46 year old African American male who was hospitalized for complaints  of abdominal pain.  He had previously been seen on the 15th, two days  previously at [facility] where a urine drug screen was  positive for opiates and cocaine.       However, the patient refused to give a urine drug screen when he presented  in the emergency room.      The patient has had a full medical evaluation for abdominal pain and chest  pain, but has unfortunately been started on Dilaudid intravenously and  orally at fairly high doses.     When the patient's Dilaudid was being tapered today for the first time, he  became acutely agitated, began yelling and screaming and complaining  bitterly of pain to the degree that the entire unit was disrupted.     The patient's Dilaudid was increased to 2 mg q.4 hours and this appeared to  appease the patient, however, he continued to grown and complain of severe  pain.  Psychiatric consultation was requested.       PAST PSYCHIATRIC HISTORY:  The patient states he has had multiple episodes of detoxification and  long-term narcotic  treatment, beginning in 1997 and ending 1-1/2 years ago  when the patient spent 6 months in the [facility] narcotic treatment  program.  The patient has had no formal psychiatric treatment.     SOCIAL HISTORY:  The patient has been using narcotics for well over 20 years, and has also  been abusing cocaine intermittently during that period of time.  There is  no history of alcoholism.  There is no history of previous psychiatric  treatment.     FAMILY HISTORY:  Negative for psychiatric problems.  Positive for substance abuse.     PAST MEDICAL HISTORY:  The patient denies a history of significant medical problems except for  "gastroenteritis" which he states requires treatment with narcotics.  The  patient also states that he has "heart problems" which consist of a heart  murmur diagnosed in childhood.  The patient denies any other  significant  medical problems.     PERSONAL HISTORY:  The patient is originally from [place].  He currently works as a  Arboriculturist.  He has a girlfriend in [place] and a sister and who also lives  in [place].  The patient has informed neither of these close associates of  his hospitalization at our facility and they have not visited him.       The patient reports that prior to admission, he was snorting heroin on a  more or less regular basis.     MENTAL STATUS EXAMINATION:  The patient was a thin African American male.  He was approached in the  hospital room, at which time he had his head covered.  However, he  ultimately consented to the interview when limits were set and he was asked  to cooperate.  During most of the interview, the patient's mood was calm.   Speech was logical, coherent, relevant, and appropriate to the subject at  hand.  There was no moaning, groaning, or complaint of pain.  These only  came up when the issue of reducing the patient's narcotics were discussed.     Again, the patient's pain complaints appeared to dissipate rapidly once he  was engaged in conversation.  The patient gave the above history in  response to his direct questions.  The patient's speech was logical,  coherent, relevant, and appropriate to the subject at hand.  While speaking  quietly to the examining psychiatrist, the patient did not _____ move  around or complain of abdominal pain.  This began immediately prior to the  interview, but after the discussion with the patient in which he was  advised that noisy behavior would not be tolerated, the patient quieted  down.  There were no involuntary motor movements, tremors or rigidity.  The  patient was fully alert, oriented, with intact recent and remote memory and  the ability to fully attend to the conversation.     The patient declined admission to the psychiatric unit for purposes of  detoxification.  The patient's vital signs and medications are shown below.            D:  09/18/2011 20:47 PM by Dr. London Pepper, MD [phone])  T:  09/18/2011 21:17 PM by [id number]      Everlean Cherry: [id number]) (Doc ID: [id number])

## 2011-09-18 NOTE — Progress Notes (Signed)
Pt does not have any [place] needs.

## 2011-09-19 ENCOUNTER — Encounter
Admission: EM | Disposition: A | Discharge status: Home / Self Care | Payer: Self-pay | Source: Home / Self Care | Attending: Family Medicine

## 2011-09-19 ENCOUNTER — Encounter: Payer: Self-pay | Admitting: Pain Medicine

## 2011-09-19 ENCOUNTER — Ambulatory Visit: Payer: Self-pay

## 2011-09-19 ENCOUNTER — Inpatient Hospital Stay: Payer: Medicaid Other | Admitting: Pain Medicine

## 2011-09-19 LAB — CK: Creatine Kinase (CK): 167 U/L (ref 30–200)

## 2011-09-19 SURGERY — DONT USE, USE 1095-ESOPHAGOGASTRODUODENOSCOPY (EGD), DIAGNOSTIC
Anesthesia: Anesthesia General | Site: Abdomen | Wound class: Clean Contaminated

## 2011-09-19 MED ORDER — LIDOCAINE HCL 2 % IJ SOLN
INTRAMUSCULAR | Status: DC | PRN
Start: 2011-09-19 — End: 2011-09-19
  Administered 2011-09-19: 35 mg

## 2011-09-19 MED ORDER — MIDAZOLAM HCL 2 MG/2ML IJ SOLN
INTRAMUSCULAR | Status: DC | PRN
Start: 2011-09-19 — End: 2011-09-19
  Administered 2011-09-19: 2 mg via INTRAVENOUS

## 2011-09-19 MED ORDER — PROPOFOL INFUSION 10 MG/ML
INTRAVENOUS | Status: DC | PRN
Start: 2011-09-19 — End: 2011-09-19
  Administered 2011-09-19: 25 mg via INTRAVENOUS
  Administered 2011-09-19: 50 mg via INTRAVENOUS
  Administered 2011-09-19: 25 mg via INTRAVENOUS
  Administered 2011-09-19: 50 mg via INTRAVENOUS

## 2011-09-19 MED ORDER — FENTANYL CITRATE 0.05 MG/ML IJ SOLN
INTRAMUSCULAR | Status: AC
Start: 2011-09-19 — End: ?
  Filled 2011-09-19: qty 2

## 2011-09-19 MED ORDER — FENTANYL CITRATE 0.05 MG/ML IJ SOLN
INTRAMUSCULAR | Status: DC | PRN
Start: 2011-09-19 — End: 2011-09-19
  Administered 2011-09-19: 100 ug via INTRAVENOUS

## 2011-09-19 MED ORDER — MIDAZOLAM HCL 2 MG/2ML IJ SOLN
INTRAMUSCULAR | Status: AC
Start: 2011-09-19 — End: ?
  Filled 2011-09-19: qty 1

## 2011-09-19 SURGICAL SUPPLY — 69 items
BALLOON FEEDING 24FR X 4.4CM (Endoscopic Supplies) IMPLANT
BALN DILATOR 18-20MM 8CM (Balloon) IMPLANT
BELT RB FM UNV 40X6IN LF NS SLD PNL ELC (Patient Supply)
BELT RIB UNIVERSAL L40 IN X W6 IN FOAM DEROYAL SOLID PANEL ELASTIC (Patient Supply) IMPLANT
BLOCK BITE MAXI 60FR LF STRD STRAP SDPRT (Procedure Accessories) ×1
BLOCK BITE OD60 FR STURDY STRAP SIDEPORT (Procedure Accessories) ×1
BLOCK BITE OD60 FR STURDY STRAP SIDEPORT DENTAL RETENTION RIM MAXI (Procedure Accessories) ×1 IMPLANT
BRUSH CYTO CNMD 3MM 2.1MM 230CM LF STRL (Brushes)
BRUSH CYTOLOGY 230CM SHTH BRSTL PSTV RING 3MM 2.1MM CONMED COLONOSCOPE (Brushes) IMPLANT
BRUSH CYTOLOGY L230 CM SHEATH BRISTLE (Brushes)
BUTTON GSTRM SIL 5ML MCKY SECUR-LOK 20FR (Tubing)
BUTTON MIC-KEY SECUR-LOK GASTROSTOMY (Tubing)
BUTTON MIC-KEY SECUR-LOK GASTROSTOMY OD20 FR L3.5 CM 5 ML RADIOPAQUE (Tubing) IMPLANT
CATHETER BALLOON DILATATION CRE 2.8 MM (Dilator)
CATHETER BALLOON DILATATION CRE PEBAX (Balloon) IMPLANT
CATHETER OD15-16.5-18 MM ODSEC6 FR L180 CM CREâ„¢ BALLOON DILATATION L8 (Dilator) IMPLANT
CATHETER OD6 FR ODSEC12-13.5-15 MM L180 CM CREâ„¢ BALLOON DILATATION L8 (Balloon) IMPLANT
CLIP QUICKCLIP2 GI FIXATN LONG (Clip) IMPLANT
DEVICE MEASURING OTW MEASURE MIC-KEY* (Prep) IMPLANT
DEVICE MSR MCKY LF STRL OTW MSR DISP (Prep)
DILATOR ESCP PEBAX 2.8MM CRE 15-16.5-18 (Dilator)
DILATOR ESCP PEBAX CRE 6FR 12-13.5-15MM (Balloon) IMPLANT
FORCEPS BIOPSY L240 CM JUMBO MICROMESH (Instrument) ×1
FORCEPS BIOPSY L240 CM JUMBO MICROMESH TEETH STREAMLINE CATHETER (Instrument) ×1 IMPLANT
FORCEPS BIOPSY L240 CM LARGE CAPACITY (Instrument)
FORCEPS BIOPSY L240 CM MICROMESH TEETH STREAMLINE CATHETER NEEDLE (Instrument) IMPLANT
FORCEPS BIOPSY L240 CM STANDARD CAPACITY (Instrument)
FORCEPS BX SS JMB RJ 4 2.8MM 240CM STRL (Instrument) ×1
FORCEPS BX SS LG CPC RJ 4 2.4MM 240CM (Instrument)
FORCEPS BX STD CPC RJ 4 2.2MM 240CM STRL (Instrument)
GOWN CHEMOTHERAPY L47 IN X W28 IN UNIVERSAL OPEN BACK OVERHEAD KNIT (Gown) ×2 IMPLANT
GOWN CHMO PP PE UNV 47X28IN LF OPN BCK (Gown) ×4
GUIDEWIRE ENDOSCOPIC L200 CM (Guidewire) IMPLANT
GUIDEWIRE ENDOSCOPIC SAVARY-GILLIARD L200 CM FLEXIBLE TIP STAINLESS (Guidewire) IMPLANT
GUIDEWIRE URO SS S-G 200CM NS FLX TP (Guidewire) IMPLANT
KIT UNIVERSAL IRRIGATION SOL (Kits) ×2 IMPLANT
NEEDLE CARR-LOCKE INJECT 25GX5 (Needles) IMPLANT
NET SPEC RTRVL STD RTHNT 2.5MM 230CM LF (Urology Supply)
NET SPEC RTRVL UNV RTHNT PLTN 2.5MM 230 (GE Lab Supplies) IMPLANT
NET SPECIMEN RETRIEVAL L230 CM STANDARD (Urology Supply)
NET SPECIMEN RETRIEVAL L230 CM STANDARD SHEATH OD2.5 MM L6 CM X W3 CM (Urology Supply) IMPLANT
OVERTUBE ENDOSCOPIC L25 CM STANDARD (Endoscopic Supplies)
OVERTUBE ENDOSCOPIC L25 CM STANDARD TAPER INSUFFLATION CAP OD19.5 MM (Endoscopic Supplies) IMPLANT
OVERTUBE ESCP STD TPR GUARDUS 19.5MM (Endoscopic Supplies)
PAD ELECTROSRG GRND REM W CRD (Procedure Accessories) IMPLANT
PROBE COAG FIAPC 6.9FR 7.2FT CRCMF PLG (Blade)
PROBE COAGULATION L7.2 FT (Blade)
PROBE COAGULATION L7.2 FT CIRCUMFERENTIAL PLUG PLAY FUNCTIONALITY (Blade) IMPLANT
PROBE ELECTROSURGICAL L220 CM FLEXIBLE (Procedure Accessories)
PROBE ELECTROSURGICAL L220 CM FLEXIBLE STRAIGHT FIRE OD2.3 MM FIAPC (Procedure Accessories) IMPLANT
PROBE ESURG FIAPC 2.3MM 220CM STRL FLXB (Procedure Accessories)
SNARE ELECTRO SURG 20MM (Procedure Accessories) IMPLANT
SOLN LUBRICATING JELLY 4.25OZ (Irrigation Solutions) ×2 IMPLANT
SPONGE GAUZE L4 IN X W4 IN 16 PLY (Dressing) ×1
SPONGE GAUZE L4 IN X W4 IN 16 PLY MAXIMUM ABSORBENT USP TYPE VII (Dressing) ×1 IMPLANT
SPONGE GZE CTTN CRTY 4X4IN LF NS 16 PLY (Dressing) ×1
SYRINGE 50 ML GRADUATE NONPYROGENIC DEHP (Syringes, Needles)
SYRINGE 50 ML GRADUATE NONPYROGENIC DEHP FREE PVC FREE BD MEDICAL (Syringes, Needles) IMPLANT
SYRINGE INFL 60ML ALN II STRL GA DISP (Syringes, Needles)
SYRINGE INFLATION 60 ML GAUGE CRE (Syringes, Needles) IMPLANT
SYRINGE MED 50ML LF STRL GRAD N-PYRG (Syringes, Needles)
TUBE GSTRM STRG EVV 24FR REPL BLN STD PR (Tube) IMPLANT
TUBE OD24 FR STRAIGHT ENDOVIVE (Tube) IMPLANT
TUBE OD24 FR STRAIGHT ENDOVIVEâ„¢ REPLACEMENT BALLOON STANDARD PROFILE (Tube) IMPLANT
WATER STERILE PLASTIC POUR BOTTLE 1000 (Irrigation Solutions) ×1
WATER STERILE PLASTIC POUR BOTTLE 1000 ML (Irrigation Solutions) ×1 IMPLANT
WATER STERILE PLASTIC POUR BOTTLE 250 ML (Irrigation Solutions) ×1 IMPLANT
WATER STRL 1000ML LF PLS PR BTL (Irrigation Solutions) ×1
WATER STRL 250ML LF PLS PR BTL (Irrigation Solutions) ×1

## 2011-09-19 NOTE — Transfer of Care (Signed)
Anesthesia Transfer of Care Note    Patient: [name] [name]    Procedures performed: Procedure(s) with comments:  EGD - **[id number]**  EGD, BIOPSY    Anesthesia type: General TIVA    Patient location:Phase I PACU    Last vitals:   Filed Vitals:    09/19/11 1407   BP: 147/84   Pulse: 50   Temp: 100.9 F (38.3 C)   Resp: 16   SpO2: 99%       Post pain: Patient not complaining of pain, continue current therapy     Mental Status:awake    Respiratory Function: tolerating nasal cannula    Cardiovascular: stable    Nausea/Vomiting: patient not complaining of nausea or vomiting    Hydration Status: adequate    Post assessment: no apparent anesthetic complications

## 2011-09-19 NOTE — Op Note (Signed)
OP NOTE    Date Time: 09/19/2011 3:43 PM    Patient Name:   [name] [name]    Date of Operation:   09/12/2011 - 09/19/2011    Providers Performing:   Surgeon(s):  Jef Futch, Verlin Grills., MD    Assistant (s):   Privette, Skeet Latch, RN - Circulator  Dolores Frame, RN - Scrub Person    Operative Procedure:   Procedure(s):  EGD  EGD, BIOPSY    Preoperative Diagnosis:   Pre-Op Diagnosis Codes:     * GI bleed [578.9]    Postoperative Diagnosis:   duodenitis, gastritis, hiatal hernia    Anesthesia:   Monitor Anesthesia Care      Specimens:   Biopsy: Yes    Findings:   After previous informed consent was obtained (including a description of risks, benefits, alterative procedures, and special risks appropriate to this patient) and appropriate history and physical performed, confirmation of patient ID with two identifiers was done, and the procedure was begun.  Throughout the procedure, continuous monitoring of the patient blood pressure, pulse, rhythm and oxygen saturation was done.    The Olympus video upper endoscope was inserted into the mouth and advanced to the second portion of duodenum with retroflexion in the cardia.  The esophagus, stomach, and duodenum appeared visually normal.  Biopsies were taken from the esophagus, stomach, and duodenum.  Patient sent to recovery area, discharged in good condition to the care of family, and given post-procedure ad follow-up instructions.    Complications:   none      Signed by: Leory Plowman., MD                                                                        [facility]

## 2011-09-19 NOTE — Anesthesia Postprocedure Evaluation (Signed)
Anesthesia Post Evaluation    Patient: [name] [name]    Procedures performed: Procedure(s) with comments:  EGD - **IP-333-2/MAC**  EGD, BIOPSY    Anesthesia type: General TIVA    Patient location:Phase I PACU    Last vitals:   Filed Vitals:    09/19/11 1407   BP: 147/84   Pulse: 50   Temp: 100.9 F (38.3 C)   Resp: 16   SpO2: 99%       Post pain: Patient not complaining of pain, continue current therapy     Mental Status:awake    Respiratory Function: tolerating nasal cannula    Cardiovascular: stable    Nausea/Vomiting: patient not complaining of nausea or vomiting    Hydration Status: adequate    Post assessment: no apparent anesthetic complications

## 2011-09-19 NOTE — H&P (Signed)
GI Preop H&P    Date Time: 09/19/2011 2:47 PM  Patient Name: [name] [name]  Requesting Physician: Dorathy Daft, MD      Reason for Consultation:   Chest pain, N&V    Assessment:   Symtoms as above-suspect a strogn psychological overlay    Plan:   EGD and bx as indicates    History:   [name] [name] is a 46 y.o. male who presents to the hospital on 09/12/2011 with chest pain N&V.  He's had a number of admissions for similar complaints with no clear etilogy identifiable. No GI bleeding. Hx of drug use.    Past Medical History:     Past Medical History   Diagnosis Date   . Heart murmur        Past Surgical History:   No past surgical history on file.    Family History:   History reviewed. No pertinent family history.    Social History:     History     Social History   . Marital Status: Single     Spouse Name: N/A     Number of Children: N/A   . Years of Education: N/A     Social History Main Topics   . Smoking status: Current Everyday Smoker -- 1.0 packs/day for .5 years   . Smokeless tobacco: Not on file   . Alcohol Use: Yes      pt claims he "is not a drinker", admits to ETOH use tonight   . Drug Use: Yes     Special: Cocaine   . Sexually Active:      Other Topics Concern   . Not on file     Social History Narrative   . No narrative on file       Allergies:   No Known Allergies    Medications:     Current Facility-Administered Medications   Medication Dose Route Frequency   . HYDROmorphone  1 mg Intravenous Once   . LORazepam  2 mg Intravenous Once   . polyethylene glycol  17 g Oral Daily   . DISCONTD: cloNIDine  0.1 mg Oral Q6H SCH   . DISCONTD: cloNIDine  1 patch Transdermal Weekly       Review of Systems:   A comprehensive review of systems was: Negative except as described by admitting physician.    Physical Exam:     Filed Vitals:    09/19/11 1407   BP: 147/84   Pulse: 50   Temp: 100.9 F (38.3 C)   Resp: 16   SpO2: 99%       Intake and Output Summary (Last 24 hours) at Date Time  No intake or output data in  the 24 hours ending 09/19/11 1447    Physical Exam: Unremarkable from GI standpoint except for fact that he appears to writhe in pain intermittently. No liver problems identified on physical exam.    Labs Reviewed:     Results     Procedure Component Value Units Date/Time    Creatine Kinase (CK) [[id number]] Collected:09/19/11 0535    Specimen Information:Blood Updated:09/19/11 0607     Creatine Kinase (CK) 167 U/L               Rads:   Radiological Procedure reviewed.     Signed by: Leory Plowman., MD

## 2011-09-19 NOTE — Progress Notes (Signed)
Pt alert and oriented x 3.due medicine given to patient.npo for EGD.little anxious and c/o abdominal pain.medicated with po percocet and asking for iv ativan too.out of bed to chair.no c/o nausea.continue to monitor the patient.

## 2011-09-19 NOTE — Progress Notes (Signed)
PROGRESS NOTE    Date Time: 09/19/2011 1:15 PM  Patient Name: [name] [name], [name] [name]      Subjective:    still c/o pain abdomen   Medications:     Current Facility-Administered Medications   Medication Dose Route Frequency   . HYDROmorphone  1 mg Intravenous Once   . LORazepam  2 mg Intravenous Once   . polyethylene glycol  17 g Oral Daily   . DISCONTD: cloNIDine  0.1 mg Oral Q6H SCH   . DISCONTD: cloNIDine  1 patch Transdermal Weekly       Review of Systems:   A comprehensive review of systems was:   General ROS: negative  Psychological ROS: negative for - behavioral disorder or suicidal ideation  Ophthalmic ROS: negative for - blurry vision, double vision or loss of vision  ENT ROS: negative for - epistaxis  Hematological and Lymphatic ROS: negative for - blood clots, bruising or swollen lymph nodes  Respiratory ROS: no cough, shortness of breath, or wheezing  Cardiovascular ROS: no chest pain or dyspnea on exertion  Gastrointestinal ROS: (+)abdominal pain, no vomiting..change in bowel habits, or black or bloody stools  Genito-Urinary ROS: no dysuria, trouble voiding, or hematuria  Musculoskeletal ROS: negative for - joint pain or joint swelling  Neurological ROS: no TIA or stroke symptoms  Dermatological ROS: negative for rash      Physical Exam:     Filed Vitals:    09/19/11 0630   BP:    Pulse: 46   Temp:    Resp:    SpO2:      General appearance -  More calm today.   Mental status - alert, oriented to person, place, and time  Eyes - sclera anicteric  Nose - normal and patent, no erythema, discharge.  Mouth - mucous membranes moist.  Neck - supple, no JVD  Chest - clear to auscultation.  Heart - normal rate, regular rhythm, normal S1, S2.  Abdomen - soft, (diffuse tenderness, nondistended,bowel sounds normal.  Neurological - no focal deficit noted  Extremities - peripheral pulses normal, improved pedal edema. Left sided chronic varicose veins.      Labs:     Results     Procedure Component Value Units Date/Time     Creatine Kinase (CK) [[id number]] Collected:09/19/11 0535    Specimen Information:Blood Updated:09/19/11 0607     Creatine Kinase (CK) 167 U/L               Assessment/plan    Chest pain,  had the  stress test done , neg for ischemia   Abdominal pain-with recurrent abdominal pain-has had hospital;admissions in past without a clear explanation found.  Bradycardia , chronic , sinus , no pauses ,but as low as in 35 even when awake . Pt states has always had it in 40 ,s  avoid AVB drugs and clonidine , will d/c clonidine . Tried to call psyche to communicate this    Nausea  ,tolerating diet   Substance abuse / withdrawal              Exact etiology  for his abd pain still unclear ,. ct abdomen w/u in ED unremarkable. KUB reviewed . ,d/w  Gi , Dr Dalbert Mayotte was consulted  , he plans to do egd   Dec iv narcotics , change to Po pain meds soon  f/u , bowel management    Dr Westley Gambles rec noted        . Continue current plan of care.  Signed by: Polly Cobia, MD

## 2011-09-19 NOTE — Plan of Care (Signed)
Patient is axox3. C/o pain and nausea and medicated as ordered and it was helpful. Patient was seen by Dr. Celene Kras and new ordered was received. Per order instruction,clonidine patch was placed to left chest but unable to give po for of clonidine due to patient's bradycardia. MD Abran Cantor was made aware and per [facility] request colonocdine PO was held due to patients bradycardia HR goes down as low as 45 on telemetry. Patient slept on and off through the night and explained about pain medication new order and patient verbalized understanding. Will continue to monitor.

## 2011-09-19 NOTE — Progress Notes (Signed)
Pt received from recovery room around 6 pm. ,refused for the vital signs and asking for pain medicine and an-anxiety medicine.ativan iv 0.5 mg prn given to patient and offered po dilaudid.pt received the iv ativan but refused to take po dilaudid.wasted dilaudid in the sure -med.iv fluids continuous.pt walked to the bathroom.tele  Monitor on,running sinus on tele monitor.resting in the bed now. Report will be given to next shift nurse.

## 2011-09-19 NOTE — Anesthesia Preprocedure Evaluation (Signed)
Anesthesia Evaluation    AIRWAY    Mallampati: II    TM distance: >3 FB  Neck ROM: full  Mouth Opening:full   CARDIOVASCULAR    cardiovascular exam normal, regular and normal     DENTAL    No notable dental hx     PULMONARY    pulmonary exam normal     OTHER FINDINGS              Anesthesia Plan    ASA 2   general         Post op pain management: per surgeon  intravenous induction   informed consent obtained

## 2011-09-19 NOTE — Op Note (Signed)
OP NOTE    Date Time: 09/19/2011 3:46 PM    Patient Name:   [name] [name]    Date of Operation:   09/12/2011 - 09/19/2011    Providers Performing:   Surgeon(s):  Markita Stcharles, Verlin Grills., MD    Assistant (s):   Privette, Skeet Latch, RN - Circulator  Dolores Frame, RN - Scrub Person    Operative Procedure:   Procedure(s):  EGD  EGD, BIOPSY    Preoperative Diagnosis:   Pre-Op Diagnosis Codes:     * GI bleed [578.9]    Postoperative Diagnosis:   duodenitis, gastritis, hiatal hernia    Anesthesia:   Monitor Anesthesia Care      Specimens:   Biopsy: Yes    Findings:   After previous informed consent was obtained (including a description of risks, benefits, alterative procedures, and special risks appropriate to this patient) and appropriate history and physical performed, confirmation of patient ID with two identifiers was done, and the procedure was begun.  Throughout the procedure, continuous monitoring of the patient blood pressure, pulse, rhythm and oxygen saturation was done.    The Olympus video upper endoscope was inserted into the mouth and advanced to the second portion of duodenum with retroflexion in the cardia.  The esophagus, stomach, and duodenum appeared visually normal.  Biopsies were taken from the esophagus, and  stomach.  Patient sent to recovery area, discharged in good condition to the care of family, and given post-procedure and follow-up instructions.    Complications:   none      Signed by: Leory Plowman., MD                                                                        [facility]

## 2011-09-20 LAB — CK: Creatine Kinase (CK): 517 U/L — ABNORMAL HIGH (ref 30–200)

## 2011-09-20 LAB — CKMB: Creatinine Kinase MB (CKMB): 2 ng/mL (ref 0.0–4.9)

## 2011-09-20 NOTE — Addendum Note (Signed)
Addendum  created 09/20/11 1010 by Laurine Blazer, CRNA    Modules edited:Notes Section

## 2011-09-20 NOTE — Transfer of Care (Signed)
Anesthesia Transfer of Care Note    Patient: [name] [name]    Procedures performed: Procedure(s) with comments:  EGD - **IP-333-2/MAC**  EGD, BIOPSY    Anesthesia type: General TIVA    Patient location:Med Surgical Floor    Last vitals:   Filed Vitals:    09/20/11 0447   BP: 151/86   Pulse: 58   Temp: 100.4 F (38 C)   Resp: 18   SpO2: 98%       Post pain: Patient not complaining of pain, continue current therapy     Mental Status:awake    Respiratory Function: tolerating room air    Cardiovascular: stable    Nausea/Vomiting: patient not complaining of nausea or vomiting    Hydration Status: adequate    Post assessment: no apparent anesthetic complications and no reportable events

## 2011-09-20 NOTE — Plan of Care (Signed)
Patient alert and oriented x 3. Patient refused percocet for abdominal pain. Dilaudid po given to patient for abdominal pain of 6/10 on a pain scale, pain later went down to  2/10. po ativan given for anxiety and haldol im  given to patient for  Agitation. Patient however denies chest pain at this time. Patient sating well on room air.  Patient on tele showing sinus brady on the monitor, patient asymptomatic. Patient safety ensured will continue to monitor patient for any changes

## 2011-09-20 NOTE — Progress Notes (Signed)
Pt is not in the room . Not seen , security looking for him

## 2011-09-20 NOTE — Final Progress Note (DC Note for stay less than 48 (Signed)
DISCHARGE NOTE    Date Time: 09/20/2011 9:50 AM  Patient Name: [name] [name],[name] [name]  Attending Physician: No att. providers found    Patient left against medical advice    Signed by: Valente David MD

## 2011-09-20 NOTE — Progress Notes (Signed)
Pt alert and oriented x 3.pt left the floor several times,called security twice to bring him back on the floor.also notified to MD too.c/o abdominal pain,dilaudid po and ativan prn given to patient.encouge the patient to drink fluids as he remains refusing to receive the iv fluids.new peripheral line started on patient this morning as no iv line seen since  Previous shift.continue to monitor the patient.

## 2011-09-21 LAB — CKMB: Creatinine Kinase MB (CKMB): 1.9 ng/mL (ref 0.0–4.9)

## 2011-09-21 LAB — CK: Creatine Kinase (CK): 1219 U/L — ABNORMAL HIGH (ref 30–200)

## 2011-09-21 MED ORDER — ONDANSETRON 4 MG PO TBDP
4.00 mg | ORAL_TABLET | Freq: Four times a day (QID) | ORAL | Status: AC | PRN
Start: 2011-09-21 — End: 2011-09-24

## 2011-09-21 MED ORDER — PANTOPRAZOLE SODIUM 40 MG PO TBEC
40.00 mg | DELAYED_RELEASE_TABLET | Freq: Every day | ORAL | Status: DC
Start: 2011-09-21 — End: 2011-10-21

## 2011-09-21 MED ORDER — OXYCODONE-ACETAMINOPHEN 5-325 MG PO TABS
1.00 | ORAL_TABLET | Freq: Three times a day (TID) | ORAL | Status: AC | PRN
Start: 2011-09-21 — End: 2011-10-01

## 2011-09-21 NOTE — Discharge Instructions (Signed)
Follow up with psychiatrist and addiction center

## 2011-09-21 NOTE — Progress Notes (Addendum)
[name] [name]  46 y.o.     male    NUTRITION:    Reason for assessment: Length of stay    Assessment     Current patient's po provides inadequate nutrition to maintain weight and hydration status.    Subjective: " I am hungry and I want to eat."    Past Medical History   Diagnosis Date   . Heart murmur        Active Hospital Problems    Diagnosis   . Chest pain, unspecified   . Abdominal  pain, other specified site   . Nausea & vomiting   . Substance abuse     Diet History: regular diet                 Food Intake: 0% NPO for X 1.5 days for  procedure    Current Diet Order  Diet cardiac    GI Sxs: c/o nausea and vomiting  Skin: intact    History     Social History   . Marital Status: Single     Spouse Name: N/A     Number of Children: N/A   . Years of Education: N/A     Occupational History   . Not on file.     Social History Main Topics   . Smoking status: Current Everyday Smoker -- 1.0 packs/day for .5 years   . Smokeless tobacco: Not on file   . Alcohol Use: Yes      pt claims he "is not a drinker", admits to ETOH use tonight   . Drug Use: Yes     Special: Cocaine   . Sexually Active:      Current Meds:       HYDROmorphone 1 mg Once   polyethylene glycol 17 g Daily          . sodium chloride 125 mL (09/20/11 0226)       Intake/Output Summary (Last 24 hours) at 09/21/11 1202  Last data filed at 09/21/11 0600   Gross per 24 hour   Intake    400 ml   Output      0 ml   Net    400 ml     Recent Labs:    Lab 09/17/11 0602   WBC 7.78   HGB 10.4*   HCT 30.1*   MCV 89.1   PLT 184       Lab 09/17/11 0602   NA 140   K 3.8   CL 107   CO2 28   BUN 6*   CREAT 0.8   GLU 91   CA 8.4*   MG --   PHOS --     Anthropometrics  Height: 172.7 cm (5' 7.99")  Weight: 79.379 kg (175 lb)  Weight Change: 0   IBW/kg (Calculated) : 68.38   BMI (Calculated): 26.7     Estimated Energy Needs  Total Energy Estimated Needs: 1750-2100kcal/day  Method for Estimating Needs: 25-30kcal/kg/day IBW     Estimated Protein Needs  Total Protein Estimated  Needs: 55-85g/day  Method for Estimating Needs: 0.8-1.2g/kg/day IBW        Fluid Needs  Total Fluid Estimated Needs: 2155ml/day  Method for Estimating Needs: 13ml/kg/day IBW     No Known Allergies    Learning Needs: No    Nutrition Diagnosis:   Intake Inadequate oral intake related to altered GI function (nausea and vomiting) as evidenced by decreased po intake.    Intervention:  Resume cardiac diet when patient is medically able.  Monitor po intake and vomiting frequency and amount    Goals: Patient's po intake will meet nutritional needs to maintain weight and hydration status.      M/E:  High-moderate nutrition risk  Follow up with po and GI function      Jerald Kief, RD Pager # [phone]  7/26/201312:02 PM

## 2011-09-21 NOTE — Progress Notes (Signed)
Pt continue to be noncompliant most of the time   Off of the unit from prior to 1900 until @ 2115, becoming anxious @ about not having IV pain med,  Pt yelling put IV ativan given pt also had some n/v IV zofran given  Pt given po diluadid x1

## 2011-09-21 NOTE — Plan of Care (Signed)
Pt to [facility] per MD order. Pt verbalized understading  order. Declined transport service. Pt left floor at 1935.

## 2011-09-22 ENCOUNTER — Emergency Department: Payer: Medicaid Other

## 2011-09-22 ENCOUNTER — Emergency Department
Admission: EM | Admit: 2011-09-22 | Discharge: 2011-09-22 | Discharge status: Against Medical Advice | Payer: Medicaid Other | Attending: Emergency Medicine | Admitting: Emergency Medicine

## 2011-09-22 DIAGNOSIS — R079 Chest pain, unspecified: Secondary | ICD-10-CM | POA: Insufficient documentation

## 2011-09-22 DIAGNOSIS — Z5320 Procedure and treatment not carried out because of patient's decision for unspecified reasons: Secondary | ICD-10-CM | POA: Insufficient documentation

## 2011-09-22 NOTE — ED Notes (Signed)
Pt signed AMA tonight. Pt was admitted in the unit for heart problem. Per pt, as soon as he gets home. Pain to chest started and is getting worse

## 2011-09-22 NOTE — ED Provider Notes (Signed)
Physician/Midlevel provider first contact with patient: 09/22/11 1610         History     Chief Complaint   Patient presents with   . Chest Pain     HPI    Pt was admitted at [facility], came to ED, and signed AMA. Pt left prior to evaluation by EP.      Past Medical History   Diagnosis Date   . Heart murmur        History reviewed. No pertinent past surgical history.    History reviewed. No pertinent family history.    Social  History   Substance Use Topics   . Smoking status: Current Everyday Smoker -- 1.0 packs/day for .5 years   . Smokeless tobacco: Not on file   . Alcohol Use: Yes      pt claims he "is not a drinker", admits to ETOH use tonight       .     No Known Allergies    Current/Home Medications    ONDANSETRON (ZOFRAN ODT) 4 MG DISINTEGRATING TABLET    Take 1 tablet (4 mg total) by mouth every 6 (six) hours as needed for Nausea.    OXYCODONE-ACETAMINOPHEN (PERCOCET) 5-325 MG PER TABLET    Take 1 tablet by mouth every 8 (eight) hours as needed.    PANTOPRAZOLE (PROTONIX) 40 MG TABLET    Take 1 tablet (40 mg total) by mouth daily.        Review of Systems    Physical Exam    BP 125/74  Pulse 94  Temp(Src) 99.8 F (37.7 C) (Tympanic)  Resp 20  Ht 1.727 m  Wt 74.844 kg  BMI 25.09 kg/m2  SpO2 99%    Physical Exam    MDM and ED Course     ED Medication Orders     None           MDM      Procedures    Clinical Impression & Disposition     Clinical Impression  Final diagnoses:   Chest pain syndrome        ED Disposition     AMA Date: 09/22/2011  Patient: [name] [name]  Admitted: 09/22/2011  5:06 AM  Attending Provider: Varney Daily, MD    [name] [name] or his authorized caregiver has made the decision for the patient to leave the emergency department against the advice of tt. [name] Lesperance MD . He or his authorized caregiver has been informed and understands the inherent risks, including death, and disability.  He or his authorized caregiver has decided to accept the responsibility for this decision. [name] Hosking  [name] and all necessary parties have been advised that he may return for further evaluation or treatment. His condition at time of discharge was Stable.  [name] [name] had current vital signs as follows:  BP 125/74 Pulse 94 Temp 99.8 F (37.7 C) Temp Src: Tympanic Resp 20 Ht 1.727 m Wt 74.844 kg             New Prescriptions    No medications on file               Jeremy Johann Rashidah, MD  09/25/11 1026

## 2011-09-22 NOTE — ED Notes (Signed)
Bed:M 12<BR> Expected date:<BR> Expected time:<BR> Means of arrival:<BR> Comments:<BR>

## 2011-09-22 NOTE — Discharge Instructions (Signed)
Chest Pain of Unclear Etiology    You have been seen for chest pain. The cause of your pain is not yet known.    Your doctor has learned about your medical history, examined you, and checked any tests that were done. Still, it is unclear why you are having pain. The doctor thinks there is only a very small chance that your pain is caused by a life-threatening condition. Later, your primary care doctor might do more tests or check you again.    Sometimes chest pain is caused by a dangerous condition, like a heart attack, aorta injury, blood clot in the lung, or collapsed lung. It is unlikely that your pain is caused by a life-threatening condition if: Your chest pain lasts only a few seconds at a time; you are not short of breath, nauseated (sick to your stomach), sweaty, or lightheaded; your pain gets worse when you twist or bend; your pain improves with exercise or hard work.    Chest pain is serious. It is VERY IMPORTANT that you follow up with your regular doctor and seek medical attention immediately here or at the nearest Emergency Department if your symptoms become worse or they change.    YOU SHOULD SEEK MEDICAL ATTENTION IMMEDIATELY, EITHER HERE OR AT THE NEAREST EMERGENCY DEPARTMENT, IF ANY OF THE FOLLOWING OCCURS:   Your pain gets worse.   Your pain makes you short of breath, nauseated, or sweaty.   Your pain gets worse when you walk, go up stairs, or exert yourself.   You feel weak, lightheaded, or faint.   It hurts to breathe.   Your leg swells.   Your symptoms get worse or you have new symptoms or concerns.            AMA    You are leaving here against the doctor's advice.     I state that (I) _________________________________, am leaving the hospital against my doctor s advice. My doctor has told me that there are risks and possible consequences of this decision. These risks and problems that might be prevented if I were to stay include:     List possible consequences (Write in by  hand):                  I do not hold my doctor responsible for any problems which may result from my leaving. The same applies to the hospital and its employees, officers, directors, financial managers and agents.      I have been told about the risks and dangers of leaving the hospital right now. I know that I may need to pay (even for the full visit) if my medical insurance does not pay.    Return here or go to the nearest Emergency Department if you get worse, something changes, or you change your mind. The Emergency Department (or urgent care) is always willing to continue to care for you. It is VERY IMPORTANT for you to follow up with your family doctor. If you do not have a family doctor, ask the medical workers to help you find one.

## 2011-09-23 LAB — ECG 12-LEAD
Atrial Rate: 55 {beats}/min
P Axis: 43 degrees
P-R Interval: 132 ms
Q-T Interval: 428 ms
QRS Duration: 88 ms
QTC Calculation (Bezet): 409 ms
R Axis: -35 degrees
T Axis: 1 degrees
Ventricular Rate: 55 {beats}/min

## 2011-10-03 NOTE — Discharge Summary (Signed)
PROGRESS NOTE/DISCHARGE SUMMARY      Today's Date Time: 10/03/2011 6:53 AM    Admission Date and Time: 09/12/2011  7:21 PM  Patient Name: [name] [name],[name] [name]  Attending Physician: No att. providers found  Primary Care Physician: No primary provider on file.  LOS: 9    Date of Admission:   09/12/2011    Date of Discharge:   09/21/2011    Discharge Dx:   Acute narcotic withdrawal.   2. Narcotic addiction.   3. Cocaine abuse.   4. Mixed substance abuse.   AXIS II: Personality disorder, not otherwise specified.   AXIS III:   1. Gastrointestinal pains. Recurrent admissions   2.acute on chronic nonresolving  Abdominal pains., nausea Seen by GI s/p egd with duodenitis, gastritis, hiatal hernia, bx result pending tobe followed by PCP  Tolerating diet   3. Pain related to narcotic withdrawal.   4. Chest pain., s/p neg stress   Bradycardia , chronic , sinus , no pauses ,but as low as in 35 even when awake . Pt states has always had it in 40 ,s avoid AVB drugs and clonidine ,     Patient Active Problem List    Diagnosis Date Noted   . Chest pain, unspecified 09/13/2011   . Abdominal  pain, other specified site 09/13/2011   . Nausea & vomiting 09/13/2011   . Substance abuse 09/13/2011   . Chest pain syndrome 09/10/2011     Class: Acute   . Cocaine abuse 09/10/2011     Class: Acute   . Alcohol addiction 09/10/2011     Class: Chronic        Past Medical History:     Past Medical History   Diagnosis Date   . Heart murmur        Past Surgical history:   No past surgical history on file.    Consultations:   Treatment Team: Technician: Willadean Carol     Procedures/Radiology performed:     Results     Procedure Component Value Units Date/Time    CK-MB [[id number]] Collected:09/21/11 0546     Creatinine Kinase MB (CKMB) 1.9 ng/mL Updated:09/21/11 0717    Creatine Kinase (CK) [[id number]]  (Abnormal) Collected:09/21/11 0546    Specimen Information:Blood Updated:09/21/11 0611     Creatine Kinase (CK) 1219 (H) U/L     CK-MB [[id number]]  Collected:09/20/11 0534     Creatinine Kinase MB (CKMB) 2.0 ng/mL Updated:09/20/11 0658    Creatine Kinase (CK) [[id number]]  (Abnormal) Collected:09/20/11 0534    Specimen Information:Blood Updated:09/20/11 0641     Creatine Kinase (CK) 517 (H) U/L     Creatine Kinase (CK) [[id number]] Collected:09/19/11 0535    Specimen Information:Blood Updated:09/19/11 0607     Creatine Kinase (CK) 167 U/L     Creatine Kinase (CK) [[id number]] Collected:09/18/11 0528    Specimen Information:Blood Updated:09/18/11 0648     Creatine Kinase (CK) 99 U/L         Radiology Results (24 Hour)     ** No Results found for the last 24 hours. [facility] Course:   See h and p   This 46 year old African American male who was hospitalized for complaints   of abdominal pain and chest pain  He had previously been seen on the 15th, two days   previously at [facility] where a urine drug screen was   positive for opiates and cocaine.   However, the  patient refused to give a urine drug screen when he presented   in the emergency room.   The patient has had a full medical evaluation for abdominal pain ,n,v ,ct abdomen w/u in ED unremarkable  seen by gi s/p egd for ongoing sxs , results as above ,started on PPI .  contd to c/o  chest   Pain ,had a  stress test which was neg .  pt needed  Dilaudid intravenously and   orally at fairly high doses.   When the patient's Dilaudid was being tapered today for the first time, he   became acutely agitated, began yelling and screaming and complaining   bitterly of pain to the degree that the entire unit was disrupted.    Psychiatric consultation was requested. rec rapid taper of IV dilaudid .   Clonidine d/c sec to chronic asymptomatic bradycardia .   Pt finally d/c on few po pain med . And PPi , needs to f/u with his pcp for bx   Psyche consult and detox program recommended   PAST PSYCHIATRIC HISTORY:    DISCHARGE DAY EXAM:  No data found.    Body mass index is 26.61  kg/(m^2).  General: awake, alert, oriented x 3; no acute distress.  Cardiovascular: regular rate and rhythm, no murmurs, rubs or gallops  Lungs: clear to auscultation bilaterally, without wheezing, rhonchi, or rales  Abdomen: soft, non-tender, non-distended; no palpable masses, no hepatosplenomegaly, normoactive bowel sounds, no rebound or guarding  Extremities: no clubbing, cyanosis, or edema  Other:     Discharge Medications:     Discharge Medication List as of 09/21/2011  6:53 PM      START taking these medications    Details   oxyCODONE-acetaminophen (PERCOCET) 5-325 MG per tablet Take 1 tablet by mouth every 8 (eight) hours as needed., Starting 09/21/2011, Until Mon 10/01/11, Print      pantoprazole (PROTONIX) 40 MG tablet Take 1 tablet (40 mg total) by mouth daily., Starting 09/21/2011, Until Sun 10/21/11, Print         CONTINUE these medications which have CHANGED    Details   ondansetron (ZOFRAN ODT) 4 MG disintegrating tablet Take 1 tablet (4 mg total) by mouth every 6 (six) hours as needed for Nausea., Starting 09/21/2011, Until Mon 09/24/11, Print               Discharge Instructions:     Disposition:  home    Patient was instructed to follow up with Primary Care Doctor No primary provider on file. in     F/u with psyche   Total time    Minutes spent coordinating discharge and reviewing discharge plan:More than 30 minutes      Signed by: Dorathy Daft, MD    CC: No primary provider on file.

## 2011-10-08 ENCOUNTER — Emergency Department
Admission: EM | Admit: 2011-10-08 | Discharge: 2011-10-08 | Disposition: A | Discharge status: Home / Self Care | Payer: Medicaid Other | Attending: Emergency Medicine | Admitting: Emergency Medicine

## 2011-10-08 ENCOUNTER — Emergency Department: Payer: Medicaid Other

## 2011-10-08 DIAGNOSIS — F17200 Nicotine dependence, unspecified, uncomplicated: Secondary | ICD-10-CM | POA: Insufficient documentation

## 2011-10-08 DIAGNOSIS — F1410 Cocaine abuse, uncomplicated: Secondary | ICD-10-CM | POA: Insufficient documentation

## 2011-10-08 DIAGNOSIS — R1013 Epigastric pain: Secondary | ICD-10-CM | POA: Insufficient documentation

## 2011-10-08 LAB — CBC AND DIFFERENTIAL
Basophils Absolute Automated: 0.02 x10 3/uL (ref 0.00–0.20)
Basophils Automated: 0 % (ref 0–2)
Eosinophils Absolute Automated: 0.04 x10 3/uL (ref 0.00–0.70)
Eosinophils Automated: 0 % (ref 0–5)
Hematocrit: 32.2 % — ABNORMAL LOW (ref 42.0–52.0)
Hgb: 10.8 g/dL — ABNORMAL LOW (ref 13.0–17.0)
Immature Granulocytes Absolute: 0.02 x10 3/uL
Immature Granulocytes: 0 % (ref 0–1)
Lymphocytes Absolute Automated: 1.18 x10 3/uL (ref 0.50–4.40)
Lymphocytes Automated: 11 % — ABNORMAL LOW (ref 15–41)
MCH: 30.7 pg (ref 28.0–32.0)
MCHC: 33.5 g/dL (ref 32.0–36.0)
MCV: 91.5 fL (ref 80.0–100.0)
MPV: 10.1 fL (ref 9.4–12.3)
Monocytes Absolute Automated: 0.54 x10 3/uL (ref 0.00–1.20)
Monocytes: 5 % (ref 0–11)
Neutrophils Absolute: 9.24 x10 3/uL — ABNORMAL HIGH (ref 1.80–8.10)
Neutrophils: 84 % — ABNORMAL HIGH (ref 52–75)
Nucleated RBC: 0 /100{WBCs} (ref 0–1)
Platelets: 213 x10 3/uL (ref 140–400)
RBC: 3.52 x10 6/uL — ABNORMAL LOW (ref 4.70–6.00)
RDW: 13 % (ref 12–15)
WBC: 11.02 x10 3/uL — ABNORMAL HIGH (ref 3.50–10.80)

## 2011-10-08 LAB — HEPATIC FUNCTION PANEL
ALT: 17 U/L (ref 0–55)
AST (SGOT): 22 U/L (ref 5–34)
Albumin/Globulin Ratio: 1.2 (ref 0.9–2.2)
Albumin: 3.8 g/dL (ref 3.5–5.0)
Alkaline Phosphatase: 65 U/L (ref 40–150)
Bilirubin Direct: 0.3 mg/dL (ref 0.0–0.5)
Bilirubin Indirect: 0.3 mg/dL (ref 0.0–1.0)
Bilirubin, Total: 0.6 mg/dL (ref 0.2–1.2)
Globulin: 3.1 g/dL (ref 2.0–3.6)
Protein, Total: 6.9 g/dL (ref 6.0–8.3)

## 2011-10-08 LAB — BASIC METABOLIC PANEL
Anion Gap: 9 (ref 5.0–15.0)
BUN: 13 mg/dL (ref 9.0–21.0)
CO2: 26 mEq/L (ref 22–29)
Calcium: 9.3 mg/dL (ref 8.5–10.5)
Chloride: 104 mEq/L (ref 98–107)
Creatinine: 0.8 mg/dL (ref 0.7–1.3)
Glucose: 99 mg/dL (ref 70–100)
Potassium: 4.4 mEq/L (ref 3.5–5.1)
Sodium: 139 mEq/L (ref 136–145)

## 2011-10-08 LAB — LIPASE: Lipase: 16 U/L (ref 8–78)

## 2011-10-08 LAB — RAPID DRUG SCREEN, URINE
Barbiturate Screen, UR: NEGATIVE
Benzodiazepine Screen, UR: NEGATIVE
Cannabinoid Screen, UR: NEGATIVE
Cocaine, UR: POSITIVE
Opiate Screen, UR: POSITIVE
PCP Screen, UR: NEGATIVE
Urine Amphetamine Screen: NEGATIVE

## 2011-10-08 LAB — GFR: EGFR: >60

## 2011-10-08 LAB — HEMOLYSIS INDEX: Hemolysis Index: 5 Index (ref 0–18)

## 2011-10-08 MED ORDER — SODIUM CHLORIDE 0.9 % IV BOLUS
1000.00 mL | Freq: Once | INTRAVENOUS | Status: AC
Start: 2011-10-08 — End: 2011-10-08
  Administered 2011-10-08: 1000 mL via INTRAVENOUS

## 2011-10-08 NOTE — ED Notes (Signed)
Pt. Reports R umbilical abd pain x1 hr along with N/V/D.

## 2011-10-08 NOTE — Discharge Instructions (Signed)
Follow up with primary care doctor.

## 2011-10-08 NOTE — ED Provider Notes (Signed)
EMERGENCY DEPARTMENT HISTORY AND PHYSICAL EXAM     Physician/Midlevel provider first contact with patient: 10/08/11 0105         Date: 10/08/2011  Patient Name: [name] [name]  Attending Physician: Ok Edwards MD  Diagnosis and Treatment Plan       Clinical Impression:   1. Abdominal pain, epigastric        Treatment Plan:   ED Disposition     Discharge [name] [name] discharge to home/self care.    Condition at discharge: Stable            History of Presenting Illness     Chief Complaint   Patient presents with   . Abdominal Pain   . Diarrhea   . Emesis       History Provided By: pt   Chief Complaint: epigastic abd pain   Onset: 2 hours PTA   Timing: 11pm  Location: RUQ  Quality: sudden, sharp   Severity: moderate   Modifying Factors: chronic abd pain   Associated sxs: substance abuse, cocaine use earlier today    Additional History: [name] [name] is a 46 y.o. male pt who c/o epigastric abd pain x 2 hours PTA. Pt reports pain was sudden and sharp. Pt has had chronic abd pain. Pt reports cocaine use earlier today.     Pt has had chronic abd pain and had an extensive workup in July including a CAT scan of the abdomen which showed unremarkable results. Pt has hx of substance abuse and requests Dilaudid.     PCP: No primary provider on file.      Current facility-administered medications:sodium chloride 0.9 % bolus 1,000 mL, Completed, 1,000 mL, Intravenous, Once, Ok Edwards, MD, 1,000 mL at 10/08/11 0055  Current outpatient prescriptions:pantoprazole (PROTONIX) 40 MG tablet, Active, Take 1 tablet (40 mg total) by mouth daily., Disp: 30 tablet, Rfl: 0    Past Medical History     Past Medical History   Diagnosis Date   . Heart murmur      History reviewed. No pertinent past surgical history.    Family History     History reviewed. No pertinent family history.    Social History     History     Social History   . Marital Status: Single     Spouse Name: N/A     Number of Children: N/A   . Years of Education: N/A      Social History Main Topics   . Smoking status: Current Everyday Smoker -- 1.0 packs/day for .5 years   . Smokeless tobacco: Not on file   . Alcohol Use: Yes      pt claims he "is not a drinker", admits to ETOH use tonight   . Drug Use: Yes     Special: Cocaine   . Sexually Active:      Other Topics Concern   . Not on file     Social History Narrative   . No narrative on file        Allergies     No Known Allergies    Review of Systems     Review of Systems:  General: negative for chills, fever or malaise  Psychological: negative for depression or suicidal ideation, substance abuse, cocaine use   ENT: negative for epistaxis, nasal congestion or sore throat  Respiratory: no cough, shortness of breath, or wheezing  Cardiovascular: no chest pain or dyspnea on exertion  Gastrointestinal: RUQ abdominal pain, change in  bowel habits, or black or bloody stools  Genito-Urinary: no dysuria, trouble voiding, or hematuria  Musculoskeletal: negative for - joint pain or swelling in leg - bilateral  Neurological: no TIA or stroke symptoms  Dermatological: negative for pruritus and rash      Physical Exam     BP 125/87  Pulse 61  Temp 97.9 F (36.6 C)  Resp 20  SpO2 100%  Pulse Oximetry Analysis - Normal 96 ra    Constitutional: Vital signs reviewed. Well appearing, no apparent distress  Neuro: alert and oriented x 3, CN II - XII, upper and lower extremities 5/5 strength intact, sensation intact, normal gait  Head: Normocephalic, atraumatic.   Eyes: Conjunctiva and sclera are normal. Extraocular movements intact, pupils equal, round, reactive  Ear, Nose, Throat:  Normal external examination of the nose and ears. Oropharynx clear, no tonsillar exudates. Uvula midline.  Neck: Normal range of motion. Trachea midline. No midline cervical spine tenderness.  Respiratory/Chest: Clear to auscultation. No respiratory distress. No wheezing, rhonchi or rales.  Cardiovascular: Regular rate and rhythm. No murmurs.  Abdomen: No masses.  No guarding or rebound. Epigastric tenderness to palpation.   Back: No midline tenderness, no CVA tenderness.   Extremities: Upper and lower extremities with no edema, no cyanosis. Normal range of motion.  Skin: Warm and dry. No rash.    Diagnostic Study Results     Labs -     Results     Procedure Component Value Units Date/Time    Basic Metabolic Panel (BMP) [[id number]] Collected:10/08/11 0050    Specimen Information:Blood Updated:10/08/11 0116     Glucose 99 mg/dL      BUN 24.4 mg/dL      Creatinine 0.8 mg/dL      Calcium 9.3 mg/dL      Sodium 010 mEq/L      Potassium 4.4 mEq/L      Chloride 104 mEq/L      CO2 26 mEq/L      Anion Gap 9.0     Lipase [[id number]] Collected:10/08/11 0050    Specimen Information:Blood Updated:10/08/11 0116     Lipase 16 U/L     Hepatic function panel (LFTs) [[id number]] Collected:10/08/11 0050    Specimen Information:Blood Updated:10/08/11 0116     Bilirubin, Total 0.6 mg/dL      Bilirubin, Direct 0.3 mg/dL      Bilirubin, Indirect 0.3 mg/dL      AST (SGOT) 22 U/L      ALT 17 U/L      Alkaline Phosphatase 65 U/L      Protein, Total 6.9 g/dL      Albumin 3.8 g/dL      Globulin 3.1 g/dL      Albumin/Globulin Ratio 1.2     HEMOLYZED INDEX [[id number]] Collected:10/08/11 0050     Hemolyzed Index 5 Index Updated:10/08/11 0116    GFR [[id number]] Collected:10/08/11 0050     EGFR >60.0 Updated:10/08/11 0116    CBC with differential [[id number]]  (Abnormal) Collected:10/08/11 0050    Specimen Information:Blood Updated:10/08/11 0101     WBC 11.02 (H) x10 3/uL      RBC 3.52 (L) x10 6/uL      Hgb 10.8 (L) g/dL      Hematocrit 09.3 (L) %      MCV 91.5 fL      MCH 30.7 pg      MCHC 33.5 g/dL      RDW 13 %      Platelets 213 x10 3/uL  MPV 10.1 fL      Neutrophils 84 (H) %      Lymphocytes Automated 11 (L) %      Monocytes 5 %      Eosinophils Automated 0 %      Basophils Automated 0 %      Immature Granulocyte 0 %      Nucleated RBC 0 /100 WBC      Neutrophils Absolute 9.24 (H) x10 3/uL      Abs  Lymph Automated 1.18 x10 3/uL      Abs Mono Automated 0.54 x10 3/uL      Abs Eos Automated 0.04 x10 3/uL      Absolute Baso Automated 0.02 x10 3/uL      Absolute Immature Granulocyte 0.02 x10 3/uL           Radiologic Studies -   Radiology Results (24 Hour)     ** No Results found for the last 24 hours. **      .    Medical Decision Making   I am the first provider for this patient.    I reviewed the vital signs, available nursing notes, past medical history, past surgical history, family history and social history.    Vital Signs-Reviewed the patient's vital signs.     No data found.      EKG:    Provider Notes: Patient with recent admission with extensive eval performed, CT abdomen/pelvis unremarkable; hx of multiple substance abuse, drug seeking behavior despite no formal diagnoses made; EGD performed, normal    Will check labs but will not order any imaging or any IV narcotic medication      Doctor's Notes     Throughout the stay in the Emergency Department, questions and concerns surrounding pain control, care plans, diagnostic studies, effects of medications administered or prescribed, and future prognostic dilemmas were assessed and addressed.    ROS addendum: The patient and/or family was asked if they had any other complaints or concerns that we could address today and nothing of significance was noted.     _______________________________  Medical DeMedical Decision Makingcision Making  Attestations:    This note is prepared by Myrna Blazer, acting as Scribe for Ok Edwards, MD.     Ok Edwards, MD - The scribe's documentation has been prepared under my direction and personally reviewed by me in its entirety.  I confirm that the note above accurately reflects all work, treatment, procedures, and medical decision making performed by me.  _______________________________        Ok Edwards, MD  10/09/11 0040

## 2011-10-12 ENCOUNTER — Emergency Department
Admission: EM | Admit: 2011-10-12 | Discharge: 2011-10-12 | Disposition: A | Discharge status: Home / Self Care | Payer: Medicaid Other | Attending: Emergency Medicine | Admitting: Emergency Medicine

## 2011-10-12 ENCOUNTER — Emergency Department: Payer: Medicaid Other

## 2011-10-12 DIAGNOSIS — G8929 Other chronic pain: Secondary | ICD-10-CM | POA: Insufficient documentation

## 2011-10-12 DIAGNOSIS — R109 Unspecified abdominal pain: Secondary | ICD-10-CM | POA: Insufficient documentation

## 2011-10-12 DIAGNOSIS — F17200 Nicotine dependence, unspecified, uncomplicated: Secondary | ICD-10-CM | POA: Insufficient documentation

## 2011-10-12 DIAGNOSIS — R011 Cardiac murmur, unspecified: Secondary | ICD-10-CM | POA: Insufficient documentation

## 2011-10-12 LAB — CBC AND DIFFERENTIAL
Basophils Absolute Automated: 0.03 10*3/uL (ref 0.00–0.20)
Basophils Automated: 0 % (ref 0–2)
Eosinophils Absolute Automated: 0.27 10*3/uL (ref 0.00–0.70)
Eosinophils Automated: 3 % (ref 0–5)
Hematocrit: 35.7 % — ABNORMAL LOW (ref 42.0–52.0)
Hgb: 12 g/dL — ABNORMAL LOW (ref 13.0–17.0)
Immature Granulocytes Absolute: 0.01 10*3/uL
Immature Granulocytes: 0 % (ref 0–1)
Lymphocytes Absolute Automated: 2.53 10*3/uL (ref 0.50–4.40)
Lymphocytes Automated: 28 % (ref 15–41)
MCH: 30.8 pg (ref 28.0–32.0)
MCHC: 33.6 g/dL (ref 32.0–36.0)
MCV: 91.5 fL (ref 80.0–100.0)
MPV: 10 fL (ref 9.4–12.3)
Monocytes Absolute Automated: 0.79 10*3/uL (ref 0.00–1.20)
Monocytes: 9 % (ref 0–11)
Neutrophils Absolute: 5.44 10*3/uL (ref 1.80–8.10)
Neutrophils: 60 % (ref 52–75)
Nucleated RBC: 0 /100 WBC (ref 0–1)
Platelets: 229 10*3/uL (ref 140–400)
RBC: 3.9 10*6/uL — ABNORMAL LOW (ref 4.70–6.00)
RDW: 13 % (ref 12–15)
WBC: 9.06 10*3/uL (ref 3.50–10.80)

## 2011-10-12 LAB — GFR: EGFR: >60

## 2011-10-12 LAB — COMPREHENSIVE METABOLIC PANEL
ALT: 15 U/L (ref 0–55)
AST (SGOT): 23 U/L (ref 5–34)
Albumin/Globulin Ratio: 1.1 (ref 0.9–2.2)
Albumin: 3.9 g/dL (ref 3.5–5.0)
Alkaline Phosphatase: 73 U/L (ref 40–150)
Anion Gap: 11 (ref 5.0–15.0)
BUN: 14 mg/dL (ref 9.0–21.0)
Bilirubin, Total: 0.7 mg/dL (ref 0.2–1.2)
CO2: 25 mEq/L (ref 22–29)
Calcium: 9.5 mg/dL (ref 8.5–10.5)
Chloride: 103 mEq/L (ref 98–107)
Creatinine: 0.8 mg/dL (ref 0.7–1.3)
Globulin: 3.4 g/dL (ref 2.0–3.6)
Glucose: 135 mg/dL — ABNORMAL HIGH (ref 70–100)
Potassium: 3.8 mEq/L (ref 3.5–5.1)
Protein, Total: 7.3 g/dL (ref 6.0–8.3)
Sodium: 139 mEq/L (ref 136–145)

## 2011-10-12 LAB — POCT URINALYSIS AUTOMATED (IAH)
Blood, UA POCT: NEGATIVE
Glucose, UA POCT: NEGATIVE
Ketones, UA POCT: NEGATIVE mg/dL
Nitrite, UA POCT: NEGATIVE
PH, UA POCT: 5.5 (ref 4.6–8)
Protein, UA POCT: POSITIVE mg/dL — AB
Specific Gravity, UA POCT: >=1.03 mg/dL (ref 1.001–1.035)
Urine Leukocytes POCT: NEGATIVE
Urobilinogen, UA POCT: 1 mg/dL

## 2011-10-12 LAB — HEMOLYSIS INDEX: Hemolysis Index: 7 Index (ref 0–18)

## 2011-10-12 LAB — LIPASE: Lipase: 27 U/L (ref 8–78)

## 2011-10-12 MED ORDER — PANTOPRAZOLE SODIUM 40 MG PO TBEC
40.00 mg | DELAYED_RELEASE_TABLET | Freq: Every day | ORAL | Status: AC
Start: 2011-10-12 — End: 2012-10-11

## 2011-10-12 MED ORDER — ONDANSETRON 4 MG PO TBDP
4.00 mg | ORAL_TABLET | Freq: Four times a day (QID) | ORAL | Status: AC | PRN
Start: 2011-10-12 — End: 2011-10-19

## 2011-10-12 NOTE — ED Provider Notes (Signed)
EMERGENCY DEPARTMENT HISTORY AND PHYSICAL EXAM     Physician/Midlevel provider first contact with patient: 10/12/11 0345         Date: 10/12/2011  Patient Name: [name] [name]    History of Presenting Illness     Chief Complaint   Patient presents with   . Abdominal Pain   . Emesis       History Provided By: patient    Chief Complaint: abdominal pain  Onset: 1 day ago  Timing: gradual   Location: diffuse  Quality: achy  Severity: moderate  Modifying Factors: none   Associated Symptoms: none    Additional History: [name] [name] is a 46 y.o. male with hx of chronic abdominal pain arrived to ED due to diffuse abdominal pain that began 1 day ago. The pt notes associated vomiting x5, diarrhea and chills. Pt denies fevers, constipation, blood in stool, blood in emesis, CP, SOB. The pt has hx of multiple substance abuse    PCP: Referring, Not On File, MD      No current facility-administered medications for this encounter.     Current Outpatient Prescriptions   Medication Status Sig Dispense Refill   . ondansetron (ZOFRAN ODT) 4 MG disintegrating tablet Active Take 1 tablet (4 mg total) by mouth every 6 (six) hours as needed for Nausea.  8 tablet  0   . pantoprazole (PROTONIX) 40 MG tablet Active Take 1 tablet (40 mg total) by mouth daily.  30 tablet  0   . pantoprazole (PROTONIX) 40 MG tablet Active Take 1 tablet (40 mg total) by mouth daily.  30 tablet  0       Past History     Past Medical History:  Past Medical History   Diagnosis Date   . Heart murmur        Past Surgical History:  History reviewed. No pertinent past surgical history.    Family History:  History reviewed. No pertinent family history.    Social History:  History   Substance Use Topics   . Smoking status: Current Everyday Smoker -- 1.0 packs/day for .5 years   . Smokeless tobacco: Not on file   . Alcohol Use: Yes      pt claims he "is not a drinker", admits to ETOH use tonight       Allergies:  No Known Allergies    Review of Systems     Constitutional:  Negative for fever, negative for chills  HENT: congestion, sneezing, rhinorrhea  Respiratory: Negative for cough or SOB  Cardiovascular: Negative for chest pain or edema  Gastrointestinal: + nausea,  vomiting,  diarrhea after cocaine yesterday now resolved            +abd pain  Genitourinary: Negative for dysuria or hematuria   Musculoskeletal: Negative for falls or back pain.  Skin: Negative for rash.   Neurological: Negative for headaches, neurologic deficit   Psychiatric/Behavioral: Negative for suicidal ideas.    Physical Exam   BP 105/56  Pulse 71  Temp(Src) 98.6 F (37 C) (Oral)  Resp 18  Ht 1.727 m  Wt 79.379 kg  BMI 26.61 kg/m2  SpO2 100%    Physical Exam   Constitutional: He is oriented to person, place, and time. He appears sleeping.       HENT:   Head: Normocephalic and atraumatic.   Right Ear: External ear normal.   Left Ear: External ear normal.   Eyes: EOM are normal. Pupils are equal, round, and reactive  to light.   Neck: Normal range of motion. Neck supple. No JVD present.   Cardiovascular: Normal rate, regular rhythm and intact distal pulses.   Pulmonary/Chest: Effort normal and breath sounds normal. No respiratory distress.   Abdominal: Soft. Bowel sounds are normal. He exhibits no distension. There mild diffuse  abdominal tenderness without rebound or guarding  GU : wnl  Musculoskeletal: Normal range of motion. He exhibits no tenderness. He exhibits no edema.       Neurological: He is alert and oriented to person, place, and time. Normal speech and gait.  No neurologic deficits   Skin: Skin is warm and intact. No pallor.   Psychiatric: Memory, affect and judgment normal.     Diagnostic Study Results     Labs -     Results     Procedure Component Value Units Date/Time    UA POC [[id number]]  (Abnormal) Collected:10/12/11 0323     Color UA POCT Amber Updated:10/12/11 0324     Clarity UA POCT Clear      Glucose, UA POCT Negative      Bilirubin, UA POCT Small (A)      Ketones, UA POCT Negative  mg/dL      Specific Gravity, UA POCT >=1.030 mg/dL      Blood, UA POCT  Negative      PH, UA POCT 5.5      Protein, UA POCT Positive (A) mg/dL      Urobilinogen, UA POCT 1.0 mg/dL      Nitrite, UA POCT Negative      Leukocytes, UA POCT Negative     HEMOLYZED INDEX [[id number]] Collected:10/12/11 0221     Hemolyzed Index 7 Index Updated:10/12/11 0257    GFR [[id number]] Collected:10/12/11 0221     EGFR >60.0 Updated:10/12/11 0257    Comprehensive metabolic panel [[id number]]  (Abnormal) Collected:10/12/11 0221    Specimen Information:Blood Updated:10/12/11 0257     Glucose 135 (H) mg/dL      BUN 27.2 mg/dL      Creatinine 0.8 mg/dL      Sodium 536 mEq/L      Potassium 3.8 mEq/L      Chloride 103 mEq/L      CO2 25 mEq/L      Calcium 9.5 mg/dL      Protein, Total 7.3 g/dL      Albumin 3.9 g/dL      AST (SGOT) 23 U/L      ALT 15 U/L      Alkaline Phosphatase 73 U/L      Bilirubin, Total 0.7 mg/dL      Globulin 3.4 g/dL      Albumin/Globulin Ratio 1.1      Anion Gap 11.0     Lipase [[id number]] Collected:10/12/11 0221    Specimen Information:Blood Updated:10/12/11 0257     Lipase 27 U/L     CBC with differential [[id number]]  (Abnormal) Collected:10/12/11 0221    Specimen Information:Blood Updated:10/12/11 0232     WBC 9.06 x10 3/uL      RBC 3.90 (L) x10 6/uL      Hgb 12.0 (L) g/dL      Hematocrit 43.3 (L) %      MCV 91.5 fL      MCH 30.8 pg      MCHC 33.6 g/dL      RDW 13 %      Platelets 229 x10 3/uL      MPV 10.0 fL      Neutrophils 60 %  Lymphocytes Automated 28 %      Monocytes 9 %      Eosinophils Automated 3 %      Basophils Automated 0 %      Immature Granulocyte 0 %      Nucleated RBC 0 /100 WBC      Neutrophils Absolute 5.44 x10 3/uL      Abs Lymph Automated 2.53 x10 3/uL      Abs Mono Automated 0.79 x10 3/uL      Abs Eos Automated 0.27 x10 3/uL      Absolute Baso Automated 0.03 x10 3/uL      Absolute Immature Granulocyte 0.01 x10 3/uL           Radiologic Studies -   Radiology Results (24 Hour)     ** No  Results found for the last 24 hours. **      .      Medical Decision Making   I am the first provider for this patient.    I reviewed the vital signs, available nursing notes, past medical history, past surgical history, family history and social history.    Vital Signs-Reviewed the patient's vital signs.     No data found.      Pulse Oximetry Analysis - Normal 100% on RA      Old Medical Records:yes     ED Course:   350 AM - Pt resting comfortably. Discussed abnormal results, plan for discharge, treatment and follow up.     Re-eval prior to discharge. Looks well. Pain resolved. Abdomen is soft, NT, ND, with no rebound or guarding present.   Abdominal pain precautions given    Provider Notes:   Provider Notes: Patient with recent admission with extensive eval performed, CT abdomen/pelvis unremarkable; hx of multiple substance abuse, drug seeking behavior despite no formal diagnoses made; EGD performed, normal   Will check labs but will not order any imaging or any IV narcotic medication        Diagnosis     Clinical Impression:   1. Chronic abdominal pain        _______________________________    Attestations:  This note is prepared by Adelina Mings, acting as Scribe for Dr. Brynda Rim, MD.    Dr. Brynda Rim: The scribe's documentation has been prepared under my direction and personally reviewed by me in its entirety.  I confirm that the note above accurately reflects all work, treatment, procedures, and medical decision making performed by me.    _______________________________          Cindi Carbon, MD  10/12/11 [id number]

## 2011-10-12 NOTE — Discharge Instructions (Signed)
Please take an over the counter antacid to help with your GI issues. Please stop using illicit drugs. Follow up with a pain specialist as previously instructed.     CHRONIC PAIN  Pain of recent onset ("acute pain") serves an important function. It lets you know something is wrong that needs your attention. When the body heals, acute pain goes away.  When pain lasts longer than six months, it is called "chronic pain." It may be present even after the body has healed. Chronic pain has both a physical and a psychological component. It may cause low self-esteem, depression and irritability. And, it can interfere with daily activities.  TREATMENT:  Chronic pain is treated with a combination of medicines, therapy and lifestyle changes.  Medicines may include pain relievers and antidepressants. It is best not to rely on regular use of narcotics for chronic pain. This leads to physical addiction. If narcotics are used at all, they are best limited to acute, breakthrough pain. Medicines used for seizures also help in certain types of chronic pain.  Physical therapy can offer stretching and strengthening activities as well as low-impact exercise. This can reduce certain types of chronic pain.  Occupational therapy teaches you how to do routine tasks of daily living in ways that minimize your discomfort.  Psychological therapy can help you deal with the stress in your life so you feel more at ease.  Other modalities such as meditation, yoga, biofeedback, massage and acupuncture can also help manage chronic pain.  Lifestyle habits can affect chronic pain. The following should be part of any chronic pain treatment plan.   Eat healthy   Develop an exercise routine   Get enough sleep at night   Stop smoking and limit alcohol use   Start a weight loss program if you are overweight  Many patients can be free from chronic pain. But at the very least, you should expect your pain to become less severe, occur less often and  interfere less with your daily life.  FOLLOW UP with your doctor or as advised by our staff. Let your doctor know if your current treatment plan is successful or if changes are needed.  [NOTE: If you had an X-ray or EKG (cardiogram), it will be reviewed by a specialist. You will be notified of any new findings that may affect your care.]  RESOURCES:  American Council for [employer] Society www.achenet.org  American Chronic Pain Association www.theacpa.org 9041839586     [address], [address], [place], [place] [address]. All rights reserved. This information is not intended as a substitute for professional medical care. Always follow your healthcare professional's instructions.

## 2011-10-12 NOTE — ED Notes (Signed)
Pt was called from the lobby to sub-lobby to advance.  Pt complained stating that "you don't know what you are doing." and "Take this thing out."  Two attempts were made.  RN asked to attempt.  Pt responded in the same way with her.  Pt was asked to return to the lobby because he then refused more IV attempts.

## 2011-10-12 NOTE — ED Notes (Signed)
[name] [name] is a 46 y.o. male complains of periumbilical abdominal pain starting at 2030. Pt reports vomiting x 5. Pt reports similar pain in the past, with dx of gastroenteritis. Pt reports diarrhea x 2.

## 2011-10-21 ENCOUNTER — Emergency Department
Admission: EM | Admit: 2011-10-21 | Discharge: 2011-10-21 | Disposition: A | Discharge status: Home / Self Care | Payer: Medicaid Other | Attending: Emergency Medicine | Admitting: Emergency Medicine

## 2011-10-21 ENCOUNTER — Emergency Department: Payer: Medicaid Other

## 2011-10-21 DIAGNOSIS — R0789 Other chest pain: Secondary | ICD-10-CM | POA: Insufficient documentation

## 2011-10-21 DIAGNOSIS — G5620 Lesion of ulnar nerve, unspecified upper limb: Secondary | ICD-10-CM | POA: Insufficient documentation

## 2011-10-21 HISTORY — DX: Calculus of gallbladder without cholecystitis without obstruction: K80.20

## 2011-10-21 LAB — CBC AND DIFFERENTIAL
Basophils Absolute Automated: 0.02 10*3/uL (ref 0.00–0.20)
Basophils Automated: 0 % (ref 0–2)
Eosinophils Absolute Automated: 0.06 10*3/uL (ref 0.00–0.70)
Eosinophils Automated: 1 % (ref 0–5)
Hematocrit: 32.7 % — ABNORMAL LOW (ref 42.0–52.0)
Hgb: 10.9 g/dL — ABNORMAL LOW (ref 13.0–17.0)
Immature Granulocytes Absolute: 0.03 10*3/uL
Immature Granulocytes: 0 % (ref 0–1)
Lymphocytes Absolute Automated: 0.84 10*3/uL (ref 0.50–4.40)
Lymphocytes Automated: 11 % — ABNORMAL LOW (ref 15–41)
MCH: 30.3 pg (ref 28.0–32.0)
MCHC: 33.3 g/dL (ref 32.0–36.0)
MCV: 90.8 fL (ref 80.0–100.0)
MPV: 10.5 fL (ref 9.4–12.3)
Monocytes Absolute Automated: 0.66 10*3/uL (ref 0.00–1.20)
Monocytes: 8 % (ref 0–11)
Neutrophils Absolute: 6.41 10*3/uL (ref 1.80–8.10)
Neutrophils: 80 % — ABNORMAL HIGH (ref 52–75)
Nucleated RBC: 0 /100 WBC (ref 0–1)
Platelets: 223 10*3/uL (ref 140–400)
RBC: 3.6 10*6/uL — ABNORMAL LOW (ref 4.70–6.00)
RDW: 13 % (ref 12–15)
WBC: 7.99 10*3/uL (ref 3.50–10.80)

## 2011-10-21 LAB — COMPREHENSIVE METABOLIC PANEL
ALT: 11 U/L (ref 0–55)
AST (SGOT): 20 U/L (ref 5–34)
Albumin/Globulin Ratio: 1.2 (ref 0.9–2.2)
Albumin: 3.7 g/dL (ref 3.5–5.0)
Alkaline Phosphatase: 63 U/L (ref 40–150)
Anion Gap: 10 (ref 5.0–15.0)
BUN: 12 mg/dL (ref 9.0–21.0)
Bilirubin, Total: 0.4 mg/dL (ref 0.2–1.2)
CO2: 23 mEq/L (ref 22–29)
Calcium: 8.9 mg/dL (ref 8.5–10.5)
Chloride: 106 mEq/L (ref 98–107)
Creatinine: 0.8 mg/dL (ref 0.7–1.3)
Globulin: 3.2 g/dL (ref 2.0–3.6)
Glucose: 110 mg/dL — ABNORMAL HIGH (ref 70–100)
Potassium: 4.4 mEq/L (ref 3.5–5.1)
Protein, Total: 6.9 g/dL (ref 6.0–8.3)
Sodium: 139 mEq/L (ref 136–145)

## 2011-10-21 LAB — ECG 12-LEAD
Atrial Rate: 83 {beats}/min
P Axis: 47 degrees
P-R Interval: 142 ms
Q-T Interval: 378 ms
QRS Duration: 86 ms
QTC Calculation (Bezet): 444 ms
R Axis: -34 degrees
T Axis: 1 degrees
Ventricular Rate: 83 {beats}/min

## 2011-10-21 LAB — HEMOLYSIS INDEX: Hemolysis Index: 86 Index — ABNORMAL HIGH (ref 0–18)

## 2011-10-21 LAB — B-TYPE NATRIURETIC PEPTIDE: B-Natriuretic Peptide: <10 pg/mL (ref 0–100)

## 2011-10-21 LAB — TROPONIN I: Troponin I: <0.01 ng/mL

## 2011-10-21 LAB — IHS D-DIMER: D-Dimer: 0.32 ug/mL FEU (ref 0.00–0.51)

## 2011-10-21 LAB — GFR: EGFR: >60

## 2011-10-21 LAB — ETHANOL: Alcohol: NOT DETECTED mg/dL

## 2011-10-21 LAB — LIPASE: Lipase: 20 U/L (ref 8–78)

## 2011-10-21 MED ORDER — ACETAMINOPHEN 325 MG PO TABS
650.00 mg | ORAL_TABLET | Freq: Once | ORAL | Status: DC
Start: 2011-10-21 — End: 2011-10-21
  Filled 2011-10-21 (×2): qty 2

## 2011-10-21 MED ORDER — LIDOCAINE VISCOUS 2 % MT SOLN
10.00 mL | Freq: Once | OROMUCOSAL | Status: AC
Start: 2011-10-21 — End: 2011-10-21
  Administered 2011-10-21: 10 mL via OROMUCOSAL

## 2011-10-21 MED ORDER — ALUM & MAG HYDROXIDE-SIMETH 200-200-20 MG/5ML PO SUSP
30.00 mL | Freq: Once | ORAL | Status: AC
Start: 2011-10-21 — End: 2011-10-21
  Administered 2011-10-21: 30 mL via ORAL
  Filled 2011-10-21 (×2): qty 30

## 2011-10-21 MED ORDER — PANTOPRAZOLE SODIUM 40 MG PO TBEC
40.00 mg | DELAYED_RELEASE_TABLET | Freq: Every day | ORAL | Status: AC
Start: 2011-10-21 — End: 2011-11-20

## 2011-10-21 MED ORDER — SODIUM CHLORIDE 0.9 % IV BOLUS
1000.00 mL | Freq: Once | INTRAVENOUS | Status: AC
Start: 2011-10-21 — End: 2011-10-21
  Administered 2011-10-21: 1000 mL via INTRAVENOUS

## 2011-10-21 NOTE — ED Notes (Signed)
Pt presents via ems - pt c/o midsternal chest pain that radiates to his left arm and left leg. Pt was seen at [facility] 2 weeks ago for the same sx. Per ems: pt was given 324 mg aspirin po and 1 nitro tab SL with no relief. VSS.

## 2011-10-21 NOTE — ED Provider Notes (Signed)
EMERGENCY DEPARTMENT HISTORY AND PHYSICAL EXAM     Physician/Midlevel provider first contact with patient: 10/21/11 0356         Date: 10/21/2011  Patient Name: [name] [name]    History of Presenting Illness     Chief Complaint   Patient presents with   . Chest Pain       History Provided By: pt    Chief Complaint: CP  Onset: 3.5 hours  Timing: constant  Location: low parasternal CP  Severity: moderate  Modifying Factors: worse w/direct pressure  Associated Symptoms: tingling in left hand, left elbow pain    Additional History: [name] [name] is a 46 y.o. male BIBA c/o low parasternal CP that does not radiate anywhere x 3.5 hours.  Pt sts pain began at rest.  Pt initially reported feeling numbness in his left hand, but upon further examination, pt stated it was a tingling sensation and not numbness.  Pt also reported pain in his left elbow and bilateral lower extremity edema, but denies any fever, cough, hemoptysis.    PCP: Referring, Not On File, MD      No current facility-administered medications for this encounter.     Current Outpatient Prescriptions   Medication Status Sig Dispense Refill   . HYDROmorphone (DILAUDID) 2 MG tablet Active Take 1 tablet (2 mg total) by mouth every 6 (six) hours as needed for Pain (As needed for pain).  10 tablet  0   . naproxen (NAPROSYN) 250 MG tablet Active Take 2 tablets (500 mg total) by mouth 2 (two) times daily with meals.  20 tablet  0   . pantoprazole (PROTONIX) 40 MG tablet Active Take 1 tablet (40 mg total) by mouth daily.  30 tablet  0   . pantoprazole (PROTONIX) 40 MG tablet Active Take 1 tablet (40 mg total) by mouth daily.  30 tablet  0       Past History     Past Medical History:  Past Medical History   Diagnosis Date   . Heart murmur    . Gallstones    . Gastritis        Past Surgical History:  No past surgical history on file.    Family History:  No family history on file.    Social History:  History   Substance Use Topics   . Smoking status: Current Everyday Smoker  -- 1.0 packs/day for .5 years   . Smokeless tobacco: Not on file   . Alcohol Use: Yes      pt claims he "is not a drinker", admits to ETOH use tonight       Allergies:  Allergies   Allergen Reactions   . Percocet (Oxycodone-Acetaminophen)        Review of Systems     Review of Systems   Constitutional: Negative for fever.   Respiratory: Negative for cough.         Pt denies hemoptysis   Cardiovascular: Positive for chest pain and leg swelling.   Musculoskeletal: Positive for joint pain (left elbow).   Neurological: Positive for tingling (left hand).         Physical Exam   BP 113/75  Pulse 68  Temp 98.5 F (36.9 C)  Resp 18  SpO2 100%    Physical Exam   Constitutional: Patient is oriented to person, place, and time and well-developed, well-nourished, and in moderate discomfort  Head: Normocephalic and atraumatic.   Eyes: EOM are normal. Pupils are equal, round,  and reactive to light. No scleral icterus  ENT: OP clear, MMM  Neck: Normal range of motion. Neck supple. No JVD  Cardiovascular: Normal rate and regular rhythm. No murmurs or rubs.  Pulmonary/Chest: Effort normal and breath sounds normal. No respiratory distress. Reproducible lower parasternal chest wall TTP  Abdominal: Soft. Mild midepigastric. No rebound or guarding.  Musculoskeletal: Normal range of motion. No lower extremity edema. No calf cords or TTP. L elbow TTP but without swelling or deformities  Neurological: Patient is alert and oriented to person, place, and time. GCS score is 15. Sensation intact to light touch BUE and BLE. Moves BUE and BLE without difficulty  Skin: Skin is warm and dry.       Diagnostic Study Results     Labs -     Results     Procedure Component Value Units Date/Time    B-type Natriuretic Peptide (BNP) [[id number]] Collected:10/21/11 0417    Specimen Information:Blood Updated:10/21/11 0457     B-Natriuretic Peptide <10 pg/mL     Troponin I [[id number]] Collected:10/21/11 0417    Specimen Information:Blood Updated:10/21/11  0456     Troponin I <0.01 ng/mL     Alcohol (Ethanol) Level [[id number]] Collected:10/21/11 0417    Specimen Information:Blood Updated:10/21/11 0449     Alcohol None Detected mg/dL     Comprehensive Metabolic Panel (CMP) [[id number]]  (Abnormal) Collected:10/21/11 0417    Specimen Information:Blood Updated:10/21/11 0449     Glucose 110 (H) mg/dL      BUN 59.5 mg/dL      Creatinine 0.8 mg/dL      Sodium 638 mEq/L      Potassium 4.4 mEq/L      Chloride 106 mEq/L      CO2 23 mEq/L      Calcium 8.9 mg/dL      Protein, Total 6.9 g/dL      Albumin 3.7 g/dL      AST (SGOT) 20 U/L      ALT 11 U/L      Alkaline Phosphatase 63 U/L      Bilirubin, Total 0.4 mg/dL      Globulin 3.2 g/dL      Albumin/Globulin Ratio 1.2      Anion Gap 10.0     Lipase [[id number]] Collected:10/21/11 0417    Specimen Information:Blood Updated:10/21/11 0449     Lipase 20 U/L     HEMOLYZED INDEX [[id number]]  (Abnormal) Collected:10/21/11 0417     Hemolyzed Index 86 (H) Index Updated:10/21/11 0449    GFR [[id number]] Collected:10/21/11 0417     EGFR >60.0 Updated:10/21/11 0449    D-Dimer [[id number]] Collected:10/21/11 0417     D-Dimer 0.32 ug/mL FEU Updated:10/21/11 0444    CBC and differential [[id number]]  (Abnormal) Collected:10/21/11 0417    Specimen Information:Blood Updated:10/21/11 0433     WBC 7.99 x10 3/uL      RBC 3.60 (L) x10 6/uL      Hgb 10.9 (L) g/dL      Hematocrit 76.2 (L) %      MCV 90.8 fL      MCH 30.3 pg      MCHC 33.3 g/dL      RDW 13 %      Platelets 223 x10 3/uL      MPV 10.5 fL      Neutrophils 80 (H) %      Lymphocytes Automated 11 (L) %      Monocytes 8 %      Eosinophils Automated 1 %  Basophils Automated 0 %      Immature Granulocyte 0 %      Nucleated RBC 0 /100 WBC      Neutrophils Absolute 6.41 x10 3/uL      Abs Lymph Automated 0.84 x10 3/uL      Abs Mono Automated 0.66 x10 3/uL      Abs Eos Automated 0.06 x10 3/uL      Absolute Baso Automated 0.02 x10 3/uL      Absolute Immature Granulocyte 0.03 x10 3/uL            Radiologic Studies -   Radiology Results (24 Hour)     ** No Results found for the last 24 hours. **      .      Medical Decision Making   I am the first provider for this patient.    I reviewed the vital signs, available nursing notes, past medical history, past surgical history, family history and social history.    Vital Signs-Reviewed the patient's vital signs.     No data found.      Pulse Oximetry Analysis - Normal 98% on ra    EKG:  Interpreted by the EP.   Time Interpreted:    Rate: 83   Rhythm: Normal Sinus Rhythm     Interpretation: LAD. No ST elev, depression or TWI.    Comparison: 08/2011- unchanged    Old Medical Records: Old medical records.   h/o chronic abd pain and multiple substance abuse    Pt was admitted in July 2013 for abd pain.  Was diagnosed with acute narcotic withdrawal.  Had EGD performed showing GERD.  Also had CP during admission but stress test was negative 09/17/2011.    CLINICAL INDICATION: Chest pain.   TECHNIQUE: Resting Myocardial Perfusion SPECT was performed 15 minutes   after IV injection of 3.2 mCi Thallium-201. Lexiscan pharmacologic   stress testing was performed according to protocol under the direction   of the Cardiovascular Section. The results of this are reported   separately. Midway through the [place] infusion, 28.3 mCi Tc-65m   Myoview was administered IV. Stress Gated Myocardial Perfusion SPECT was   then performed according to protocol.   INTERPRETATION: The left ventricle is normal in size and configuration.   No fixed or reversible perfusion abnormalities are identified. The left   ventricular ejection fraction is 53%. There is normal left ventricular   wall motion.   IMPRESSION:   1. No evidence of myocardial ischemia.   2. Ejection fraction of 53% with normal wall motion.          Imaging       NM Myocardial Perfusion Spect (Stress And Rest) (Order #[id number]) on 09/17/2011 - Imaging Information          Result History       NM MYOCARDIAL PERFUSION SPECT  (STRESS AND REST) (YQMVH#[id number]) on 09/17/11 - Order Result History Report.         ED Course:   [phone] - EMS reports pt called EMS 2 hours ago for his mother.  EMS arrived and examined mother who did not have an impressive exam.  EMS states pt was c/o CP to EMS as they left, so they ran an EKG and gave pt IV fluids.  Pt reported feeling better and refused transport, but then called EMS again 20 minutes PTA.    Provider Notes: atypical CP with normal ecg and neg recent stress test. CP reproducible on palpation. Low risk  for PE with neg ddimer so not PE. Not TAD.      Diagnosis     Clinical Impression:   1. 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 The scribe's documentation has been prepared under my direction and personally reviewed by me in its entirety.  I confirm that the note above accurately reflects all work, treatment, procedures, and medical decision making performed by me.    ______________________________          Marquette Old, MD  10/25/11 [phone]

## 2011-10-21 NOTE — Discharge Instructions (Signed)
Musculoskeletal Chest Pain  You have been diagnosed with musculoskeletal chest pain.  Your pain is due to an injury or inflammation (swelling) of the muscles, ligaments, cartilage (soft bone), or bone in your chest. The pain is usually sharp and knife-like and becomes worse with twisting, bending, or moving. It commonly occurs in a small area, and can be irritated by pressing on it. There is usually no shortness of breath, lightheadedness, weakness, or sweaty feeling. Some children will have pain when taking a deep breath or when coughing. Exercise usually does not affect theses symptoms.  Musculoskeletal chest pain is treated with anti-inflammatory medications (like Ibuprofen or Naproxen). Other pain medications are usually not needed. Depending on the reason for your symptoms, either warm or cool compresses (damp washcloths laid on the skin) may be helpful.  Most musculoskeletal chest pain improves over several days.  You do not need to follow up with a doctor unless your symptoms get worse or fail to improve in the next few days.  YOU SHOULD SEEK MEDICAL ATTENTION IMMEDIATELY, EITHER HERE OR AT THE NEAREST EMERGENCY DEPARTMENT, IF ANY OF THE FOLLOWING OCCURS:  Your pain gets worse.   Your pain makes you feel short of breath, nauseated, or sweaty.   You notice that your pain gets worse as you walk, go up stairs, or exert yourself.   You have any weakness or lightheadedness with your pain.   Your pain makes breathing difficult.   You develop a swollen leg.   Your symptoms get worse or you have other concerns.

## 2011-10-22 ENCOUNTER — Emergency Department
Admission: EM | Admit: 2011-10-22 | Discharge: 2011-10-22 | Disposition: A | Discharge status: Home / Self Care | Payer: Medicaid Other | Attending: Emergency Medicine | Admitting: Emergency Medicine

## 2011-10-22 ENCOUNTER — Emergency Department: Payer: Medicaid Other

## 2011-10-22 DIAGNOSIS — W010XXA Fall on same level from slipping, tripping and stumbling without subsequent striking against object, initial encounter: Secondary | ICD-10-CM | POA: Insufficient documentation

## 2011-10-22 DIAGNOSIS — F17200 Nicotine dependence, unspecified, uncomplicated: Secondary | ICD-10-CM | POA: Insufficient documentation

## 2011-10-22 DIAGNOSIS — S301XXA Contusion of abdominal wall, initial encounter: Secondary | ICD-10-CM

## 2011-10-22 DIAGNOSIS — R011 Cardiac murmur, unspecified: Secondary | ICD-10-CM | POA: Insufficient documentation

## 2011-10-22 DIAGNOSIS — S20229A Contusion of unspecified back wall of thorax, initial encounter: Secondary | ICD-10-CM | POA: Insufficient documentation

## 2011-10-22 LAB — POCT URINALYSIS AUTOMATED (IAH)
Glucose, UA POCT: NEGATIVE
Nitrite, UA POCT: NEGATIVE
PH, UA POCT: 5.5 (ref 4.6–8)
Protein, UA POCT: NEGATIVE mg/dL
Specific Gravity, UA POCT: >=1.03 mg/dL (ref 1.001–1.035)
Urine Leukocytes POCT: NEGATIVE
Urobilinogen, UA POCT: 1 mg/dL

## 2011-10-22 MED ORDER — HYDROMORPHONE HCL 2 MG PO TABS
2.00 mg | ORAL_TABLET | Freq: Four times a day (QID) | ORAL | Status: AC | PRN
Start: 2011-10-22 — End: 2011-11-01

## 2011-10-22 MED ORDER — KETOROLAC TROMETHAMINE 30 MG/ML IJ SOLN
30.00 mg | Freq: Once | INTRAMUSCULAR | Status: AC
Start: 2011-10-22 — End: 2011-10-22
  Administered 2011-10-22: 30 mg via INTRAVENOUS
  Filled 2011-10-22: qty 1

## 2011-10-22 MED ORDER — HYDROMORPHONE HCL 2 MG PO TABS
2.00 mg | ORAL_TABLET | Freq: Once | ORAL | Status: AC
Start: 2011-10-22 — End: 2011-10-22
  Administered 2011-10-22: 2 mg via ORAL
  Filled 2011-10-22: qty 1

## 2011-10-22 MED ORDER — NAPROXEN 250 MG PO TABS
500.00 mg | ORAL_TABLET | Freq: Two times a day (BID) | ORAL | Status: AC
Start: 2011-10-22 — End: 2012-10-21

## 2011-10-22 MED ORDER — DIAZEPAM 5 MG PO TABS
5.00 mg | ORAL_TABLET | Freq: Once | ORAL | Status: AC
Start: 2011-10-22 — End: 2011-10-22
  Administered 2011-10-22: 5 mg via ORAL
  Filled 2011-10-22: qty 1

## 2011-10-22 MED ORDER — OXYCODONE-ACETAMINOPHEN 5-325 MG PO TABS
2.00 | ORAL_TABLET | Freq: Once | ORAL | Status: DC
Start: 2011-10-22 — End: 2011-10-22
  Filled 2011-10-22: qty 2

## 2011-10-22 NOTE — Discharge Instructions (Signed)
Contusion    You have been diagnosed with a contusion.    A contusion is a bruise. A contusion occurs when something strikes or hits the body. This breaks small blood vessels called capillaries. When the capillaries break, blood leaks out. This makes the skin look red, purple, blue, or black. The injured area may hurt for a few days. If you take a blood thinner (like Coumadin or warfarin) the bruising may be worse.    Apply ice to the bruise. Avoid using the injured body part.    Apply ice to help with pain and swelling. Put some ice cubes in a re-sealable plastic bag (like Ziploc). Add some water. Seal the bag. Put a thin washcloth between the bag and the skin. Apply the ice bag for at least 20 minutes. Do this at least 4 times per day. It's okay to apply ice longer or more often. NEVER APPLY ICE DIRECTLY TO THE SKIN. Always keep a washcloth between the ice pack and your body.    YOU SHOULD SEEK MEDICAL ATTENTION IMMEDIATELY, EITHER HERE OR AT THE NEAREST EMERGENCY DEPARTMENT, IF ANY OF THE FOLLOWING OCCURS:   Your pain or swelling gets much worse.   You develop new numbness or tingling in or below the affected area.   Your foot or hand looks cold or pale. This could mean there is a problem with circulation (blood supply).      Low Cost Healthcare Resources in [place]    Community Health Centers/Federally Qualified Health Centers    [facility]   [email or web address]    Adult Medicine and Women's Health  [address]  [place], [place] [address]   [phone]    Pediatrics, [address], [place], [place] [address], [phone]    Family Support and Mental Health Services, [phone]     ---------------------------------------------------------------------------------------  [facility], [address], [place], [place] [address], [phone]  [email or web address]    [facility], [address], [place], [place] [address],  [phone]         ---------------------------------------------------------------------------------------  [facility]  [facility], [address], [place], [place] [address], [phone]           [facility], [address], [place], [place] [address], [phone]    ---------------------------------------------------------------------------------------    Health Departments    ---------------------------------------------------------------------------------------    [facility], [address], [place], [place] [address], [phone]  [email or web address]    [facility], [address] [place], [place] [address], [phone]  [email or web address]    ---------------------------------------------------------------------------------------  [facility], [address] , [place] [address], [phone]    ---------------------------------------------------------------------------------------  [facility], [address] [place], [place], [place] [address], [phone]    [facility], [address], [place], [place] [address], [phone]  http://www.[place].gov/hd/pcs/hdmaternity.htm    [facility], [address], [place], [place] [address], [phone]    [facility], [address], [place], [place] [address], [phone]    [place] [facility], [address] 1st Floor, [place], [place] [address], [phone]    ---------------------------------------------------------------------------------------  [facility]  [place] [facility], [address], [place], [place] [address], [phone]    ---------------------------------------------------------------------------------------  [facility], [address], [place], [place]  [address], [phone]    Free Clinic, [phone]    ---------------------------------------------------------------------------------------  [facility],   [address], [place], [place] [address], [phone]  [facility], [address] [place] [address], [place], [place] [address], [phone]    [facility], [address], [place], [place] [address], [phone]      [facility], [address], [place], [place] [address], [phone]    ---------------------------------------------------------------------------------------    Free Clinics    ---------------------------------------------------------------------------------------  ---------------------------------------------------------------------------------------  [facility],   www.[email or web address]    [facility],  www.[email or web address]    [facility],   [email or web address]    [facility], www.pwcgov.org    [place] [facility], www.vafreeclinics.org/[place]-free-clinics.asp#culc    ---------------------------------------------------------------------------------------    Other Resources    ---------------------------------------------------------------------------------------  ---------------------------------------------------------------------------------------  [facility], [address] [place], [place] [address], [phone]  [email or web address]    [facility], [address] [place], [place] [address], [phone]  [email or web address]    [facility], [address], [place], [place] [address],  [phone]    ---------------------------------------------------------------------------------------    Dental    ---------------------------------------------------------------------------------------  ---------------------------------------------------------------------------------------  [facility], [address], [place], [place] [address], [phone]    [facility], [address] 1st Floor, [place], [place] [address], [phone]  By appointment only    [facility]  [address] 2nd Floors, [place], [place] [address], [phone]    [place]  [address], [place], [place], [phone]  [address], [place], [place], [phone]  [address], [place], , [phone]    [place]  [address], [address], [place], [place], [phone]       ---------------------------------------------------------------------------------------  Health Insurance Locators    ---------------------------------------------------------------------------------------  ---------------------------------------------------------------------------------------  Foundation for Health Coverage Education, http://www.coverageforall.org/index.htm    EHealthInsurance,   http://www.ehealthinsurance.com    Health Insurance Sort,   http://www.healthinsurancesort.com    Health Insurance Online,   http://www.online-health-insurance.com    Health Plan One,   http://www.healthplanone.com    HealthCare.gov,    http://www.healthcare.gov    [facility], http://www.novaclinics.org/home  Coalition (NOVAHSC)    ---------------------------------------------------------------------------------------    Medication  Resources    ---------------------------------------------------------------------------------------  ---------------------------------------------------------------------------------------  [place] Prescription Discount Card, http://www.fairfaxrxdiscountcard.com, 1-877-776-2285, EXT 5 Helpdesk    Partnership for Prescriptions Assistance,  http://www.pparx.org    NeedyMeds,       http://www.needymeds.org    Pharmaceutical Assistance Program,    [phone]    [place] [employer] Commission  Bureau of Insurance,      http://www.scc.[place].gov/division/boi/webpages/boiconsumer.htm    National Association of Insurance  Commissioners (NAIC),      http://www.insureuonline.org    Health Insurance Info Net,     http://healthinsuranceinfo.net/getinsured/[place]

## 2011-10-22 NOTE — ED Provider Notes (Signed)
EMERGENCY DEPARTMENT HISTORY AND PHYSICAL EXAM    Date: 10/22/2011  Patient Name: [name] [name]  Attending Physician Assistant:  Henry Russel, P.A.-C    History of Presenting Illness     Chief Complaint   Patient presents with   . Back Pain       History Provided By: pt    Chief Complaint: left side pain  Onset: 1 hr ago  Timing: sudden  Location: left side/back  Quality: sharp  Severity: severe  Modifying Factors: chronic pain syndrome     Additional History: [name] [name] is a 46 y.o. male who reports left back pain.  Pt states that he was doing yard work, he lifted some heavy branches and mortar and states that he fell onto his left side.  Pt states that has pain to left lower back.  No sob.  No cp.    PCP: Referring, Not On File, MD      Current Facility-Administered Medications   Medication Status Dose Route Frequency Provider Last Rate Last Dose   . diazepam (VALIUM) tablet 5 mg Active  5 mg Oral Once Henry Russel, [place]       . HYDROmorphone (DILAUDID) tablet 2 mg Active  2 mg Oral Once Jahzion Brogden, [place]       . ketorolac (TORADOL) injection 30 mg Completed  30 mg Intravenous Once Henry Russel, [place]   30 mg at 10/22/11 2113   . oxyCODONE-acetaminophen (PERCOCET) 5-325 MG per tablet 2 tablet Active  2 tablet Oral Once Henry Russel, [place]         Current Outpatient Prescriptions   Medication Status Sig Dispense Refill   . HYDROmorphone (DILAUDID) 2 MG tablet Active Take 1 tablet (2 mg total) by mouth every 6 (six) hours as needed for Pain (As needed for pain).  10 tablet  0   . naproxen (NAPROSYN) 250 MG tablet Active Take 2 tablets (500 mg total) by mouth 2 (two) times daily with meals.  20 tablet  0   . pantoprazole (PROTONIX) 40 MG tablet Active Take 1 tablet (40 mg total) by mouth daily.  30 tablet  0   . pantoprazole (PROTONIX) 40 MG tablet Active Take 1 tablet (40 mg total) by mouth daily.  30 tablet  0       Past Medical History     Past Medical History   Diagnosis Date   . Heart murmur    . Gallstones       History reviewed. No pertinent past surgical history.    Family History     History reviewed. No pertinent family history.    Social History     History   Substance Use Topics   . Smoking status: Current Everyday Smoker -- 1.0 packs/day for .5 years   . Smokeless tobacco: Not on file   . Alcohol Use: Yes      pt claims he "is not a drinker", admits to ETOH use tonight       Allergies     No Known Allergies    Review of Systems     Review of Systems   Constitutional: Negative for fever and chills.   HENT: Negative for congestion and neck pain.    Respiratory: Negative for cough and shortness of breath.    Cardiovascular: Negative for chest pain, palpitations and leg swelling.   Gastrointestinal: Negative for nausea, vomiting, abdominal pain, diarrhea and constipation.   Genitourinary: Negative for dysuria and hematuria.   Musculoskeletal: Positive  for back pain.   Neurological: Negative for dizziness, sensory change and headaches.   Psychiatric/Behavioral: Negative for depression. The patient does not have insomnia.        Physical Exam   BP 99/69  Pulse 78  Temp 96.9 F (36.1 C)  Resp 20  SpO2 99%    Physical Exam   Nursing note and vitals reviewed.  Constitutional: He is oriented to person, place, and time and well-developed, well-nourished, and in no distress.        Pt perfectly calm in room until I walk in at which point pt begin rocking and moaning.   HENT:   Head: Normocephalic and atraumatic.   Eyes: Pupils are equal, round, and reactive to light.   Neck: Normal range of motion. Neck supple.   Cardiovascular: Normal rate and regular rhythm.    Pulmonary/Chest: Effort normal and breath sounds normal. No respiratory distress. He has no wheezes.   Abdominal: Soft. Bowel sounds are normal.   Musculoskeletal: Normal range of motion.        No bruising swelling or deformity noted.  Left flank ttp.  No midline tenderness.  Neuro vasc is intact.   Neurological: He is alert and oriented to person, place, and  time.   Skin: Skin is warm and dry.   Psychiatric: Affect normal.       Diagnostic Study Results     Labs -     Results     Procedure Component Value Units Date/Time    UA POC (POCT UA Clinitek AX) [[id number]]  (Abnormal) Collected:10/22/11 2122    Specimen Information:Urine Updated:10/22/11 2123     Color UA POCT Light Yellow      Clarity UA POCT Clear      Glucose, UA POCT Negative      Bilirubin, UA POCT Small (A)      Ketones, UA POCT Trace (A) mg/dL      Specific Gravity, UA POCT >=1.030 mg/dL      Blood, UA POCT  Trace - intact (A)      PH, UA POCT 5.5      Protein, UA POCT Negative mg/dL      Urobilinogen, UA POCT 1.0 mg/dL      Nitrite, UA POCT Negative      Leukocytes, UA POCT Negative           Radiologic Studies -   Radiology Results (24 Hour)     ** No Results found for the last 24 hours. **      .    Clinical Course in the Emergency Department     Consultations:      Re-evaluations:  This is pts 5 th visit this month for pain complaints.  Pt was seen yesterday for muscular cp.  Pt states that he was given 3mg  of dilaudid pta (from a friend ) and that has not helped him.  Pt refuses percocet.  He states that it upsets his stomach.  Pt told will rx small rx for dilaudid.  Pt is very upset about this and is demanding enough pain meds for at least a 2 week period.  I have told pt that he will need to follow up with a pmd for his pain.        Medical Decision Making   I am the first provider for this patient.    I reviewed the vital signs, available nursing notes, past medical history, past surgical history, family history and social history.  Vital Signs-Reviewed the patient's vital signs.     Patient Vitals for the past 12 hrs:   BP Temp Pulse Resp   10/22/11 2118 99/69 mmHg - 78  -   10/22/11 2021 110/68 mmHg 96.9 F (36.1 C) 77  20        Pulse Oximetry Analysis - Normal 99% on ra    Old Medical Records: Old medical records.         Diagnosis and Treatment Plan       Clinical Impression:   1.  Contusion, flank        _______________________________    Henry Russel, PA  10/22/11 2145    Henry Russel, [place]  10/23/11 [phone]

## 2011-10-22 NOTE — ED Notes (Signed)
Patient discharged by [facility]

## 2011-10-22 NOTE — ED Notes (Signed)
Pt states he was getting off work and lifted a bag of water, slipped and injured lower back. Pt moaning in pain while being questioned, but resting quietly when left alone.

## 2011-10-24 ENCOUNTER — Emergency Department
Admission: EM | Admit: 2011-10-24 | Discharge: 2011-10-24 | Disposition: A | Discharge status: Home / Self Care | Payer: Medicaid Other | Attending: Emergency Medicine | Admitting: Emergency Medicine

## 2011-10-24 ENCOUNTER — Emergency Department: Payer: Medicaid Other

## 2011-10-24 DIAGNOSIS — I83812 Varicose veins of left lower extremities with pain: Secondary | ICD-10-CM

## 2011-10-24 DIAGNOSIS — Z791 Long term (current) use of non-steroidal anti-inflammatories (NSAID): Secondary | ICD-10-CM | POA: Insufficient documentation

## 2011-10-24 DIAGNOSIS — Z79899 Other long term (current) drug therapy: Secondary | ICD-10-CM | POA: Insufficient documentation

## 2011-10-24 DIAGNOSIS — R011 Cardiac murmur, unspecified: Secondary | ICD-10-CM | POA: Insufficient documentation

## 2011-10-24 DIAGNOSIS — K2990 Gastroduodenitis, unspecified, without bleeding: Secondary | ICD-10-CM | POA: Insufficient documentation

## 2011-10-24 DIAGNOSIS — K2970 Gastritis, unspecified, without bleeding: Secondary | ICD-10-CM | POA: Insufficient documentation

## 2011-10-24 DIAGNOSIS — F17200 Nicotine dependence, unspecified, uncomplicated: Secondary | ICD-10-CM | POA: Insufficient documentation

## 2011-10-24 DIAGNOSIS — I83893 Varicose veins of bilateral lower extremities with other complications: Secondary | ICD-10-CM | POA: Insufficient documentation

## 2011-10-24 HISTORY — DX: Gastritis, unspecified, without bleeding: K29.70

## 2011-10-24 NOTE — ED Notes (Signed)
Complaint of leg pain, swelling

## 2011-10-24 NOTE — Discharge Instructions (Signed)
Return to the ER if worse.  Take 3 over the counter tablets of Ibuprofen every 8 hours as needed for pain.    An order for a left lower extremity Doppler study has been ordered for tomorrow. You may contact CVIR / Doppler (hospital operator [phone]) to find out what time to have the study.     Low Cost Healthcare Resources in [facility]/Federally Qualified Health Centers    [facility]   [email or web address]    Adult Medicine and [facility]  [address]  [place], [place] [address]   [phone]    Pediatrics, [address], [place], [place] [address], [phone]    [facility] Support and Mental Health Services, [phone]     ---------------------------------------------------------------------------------------  [facility], [address], [place], [place] [address], [phone]  [email or web address]    [facility], [address], [place], [place] [address], [phone]         ---------------------------------------------------------------------------------------  (Greater) [facility], [place] [employer] [facility] [address], [place], [place] [address], [phone], [address] [place], [place] [address], [phone]    ---------------------------------------------------------------------------------------    Health Departments    ---------------------------------------------------------------------------------------    [facility], [address], [place], [place] [address], [phone]  [email or web address]    [facility], [address], [place], [place] [address], [phone]  [email or web address]    ---------------------------------------------------------------------------------------  [facility], [address], [place] [address],  [phone]    ---------------------------------------------------------------------------------------  [facility], [address], [place], [place] [address], [phone]    [facility], [address], [place], [place] [address], [phone]  [email or web address]    [facility], [address] [place], [place] [address], [phone], [address], [place], [place] [address], [phone]    [facility], [address], [place], [place] [address], [phone]    ---------------------------------------------------------------------------------------  [facility], [address], [place], [place] [address], [phone]    ---------------------------------------------------------------------------------------  [facility], [address], [place], [place] [address], [phone]    [facility], [phone]    ---------------------------------------------------------------------------------------  [facility], [address], [place], [place] [address], [phone]  [facility], [address], [place], [place] [address], [phone]    [facility], [address] [place], [place] [address], [phone], [address] [place], [place], [place] [address], [phone]    ---------------------------------------------------------------------------------------    Free Clinics    ---------------------------------------------------------------------------------------  ---------------------------------------------------------------------------------------  [facility],   [email or web address]    [facility],  [email or web address]    [facility],   [email or web address]    [facility], www.[email or web address].uy    [employer], www.vafreeclinics.org/[place]-free-clinics.asp#culc    ---------------------------------------------------------------------------------------    Other Resources    ---------------------------------------------------------------------------------------  ---------------------------------------------------------------------------------------  [facility], [address], [place], [place] [address], [phone]  [email or web address].[place], [address], [address] [place], [place] [address], [phone]  MakePoetry.hu    [facility], [address] [place], [place], [place] [address], [phone]    ---------------------------------------------------------------------------------------    Dental    ---------------------------------------------------------------------------------------  ---------------------------------------------------------------------------------------  [facility], [address], [address] [place], [place] [address], [phone]    [facility], [address] Floor, [place], [place] [address], [phone]  By appointment only    [facility]  [address]), [address] [place], [place] [address], [phone]    [facility]  [address], [place], [place], [phone]  [address], [place], [place], [phone]  [address], [address], [place]. [place], [place], [phone]    [facility], [address], [place], [place], [phone]       ---------------------------------------------------------------------------------------  Health Insurance  Locators    ---------------------------------------------------------------------------------------  ---------------------------------------------------------------------------------------  [employer] for Health Coverage Education, http://www.wolf.info/    [employer],   http://www.ehealthinsurance.com    [employer],   http://www.healthinsurancesort.com    Health Insurance Online,   http://www.online-health-insurance.com    Health Plan One,   http://www.healthplanone.com    http://www.long-jenkins.com/,    http://www.healthcare.gov    [employer], http://www.novaclinics.org/home  [facility])    ---------------------------------------------------------------------------------------    Medication Resources    ---------------------------------------------------------------------------------------  ---------------------------------------------------------------------------------------  [employer], http://www.fairfaxrxdiscountcard.com, [phone], [address] 5 Helpdesk    Partnership for Prescriptions Assistance,  http://www.pparx.org    [employer]edy[employer]eds,       http://www.needymeds.[employer],    [phone]    [employer],      https://www.farmer-stevens.info/    [employer] of Location manager (NAIC),      http://www.insureuonline.[facility]    [employer],     CanineCocktail.co.nz

## 2011-10-24 NOTE — ED Provider Notes (Signed)
Physician/Midlevel provider first contact with patient: 10/24/11 2301         History     Chief Complaint   Patient presents with   . Leg Pain     The history is provided by the patient. No language interpreter was used.   46 yo male presents to the ED c/o painful and "swollen" varicose vein to left popliteal fossa. Pain is intermittent, sharp to dull and non-radiating.States that he has known about the varicosity for many months. Denies injury, f,c,n,v,d,CP, SOB, back pain.     Past Medical History   Diagnosis Date   . Heart murmur    . Gallstones    . Gastritis        History reviewed. No pertinent past surgical history.    No family history on file.    Social  History   Substance Use Topics   . Smoking status: Current Everyday Smoker -- 1.0 packs/day for .5 years   . Smokeless tobacco: Not on file   . Alcohol Use: Yes      pt claims he "is not a drinker", admits to ETOH use tonight       .     Allergies   Allergen Reactions   . Percocet (Oxycodone-Acetaminophen)        Current/Home Medications    HYDROMORPHONE (DILAUDID) 2 MG TABLET    Take 1 tablet (2 mg total) by mouth every 6 (six) hours as needed for Pain (As needed for pain).    NAPROXEN (NAPROSYN) 250 MG TABLET    Take 2 tablets (500 mg total) by mouth 2 (two) times daily with meals.    PANTOPRAZOLE (PROTONIX) 40 MG TABLET    Take 1 tablet (40 mg total) by mouth daily.    PANTOPRAZOLE (PROTONIX) 40 MG TABLET    Take 1 tablet (40 mg total) by mouth daily.        Review of Systems   Constitutional: Negative for fever and chills.   Respiratory: Negative for cough, chest tightness, shortness of breath, wheezing and stridor.    Cardiovascular: Negative for chest pain, palpitations and leg swelling.   Gastrointestinal: Negative for nausea, vomiting and abdominal pain.   Musculoskeletal: Positive for back pain. Negative for myalgias, joint swelling and gait problem.   Skin: Negative.    Neurological: Negative for dizziness, weakness, numbness and headaches.              Physical Exam    BP 117/77  Pulse 79  Temp 97.8 F (36.6 C)  Resp 16  Ht 1.727 m  Wt 79.379 kg  BMI 26.61 kg/m2  SpO2 100%    Physical Exam   Nursing note reviewed.  Constitutional: He is oriented to person, place, and time. He appears well-developed and well-nourished.   HENT:   Head: Normocephalic and atraumatic.   Neck: Normal range of motion. Neck supple.   Cardiovascular: Normal rate and regular rhythm.    Pulmonary/Chest: Effort normal and breath sounds normal.   Musculoskeletal: Normal range of motion.        Left knee: He exhibits normal range of motion, no swelling, no effusion, no ecchymosis, no erythema, no bony tenderness and normal meniscus. tenderness found.        Legs:       Tender varicosity left popliteal fossa. No LE edema. DP/PT intact.    Neurological: He is alert and oriented to person, place, and time.   Skin: Skin is warm and dry.  Psychiatric: He has a normal mood and affect. His behavior is normal.       MDM and ED Course     ED Medication Orders     None           MDM      Procedures    Clinical Impression & Disposition     Clinical Impression  Final diagnoses:   Varicose veins of left lower extremity with pain        ED Disposition     Discharge Hung Rhinesmith Blake discharge to home/self care.    Condition at discharge: Stable             New Prescriptions    No medications on file               Fransisca Connors, [place]  10/24/11 2349

## 2011-10-25 ENCOUNTER — Emergency Department: Payer: Medicaid Other

## 2011-10-25 ENCOUNTER — Emergency Department
Admission: EM | Admit: 2011-10-25 | Discharge: 2011-10-26 | Disposition: A | Discharge status: Home / Self Care | Payer: Medicaid Other | Attending: Emergency Medicine | Admitting: Emergency Medicine

## 2011-10-25 DIAGNOSIS — X58XXXA Exposure to other specified factors, initial encounter: Secondary | ICD-10-CM | POA: Insufficient documentation

## 2011-10-25 DIAGNOSIS — F1910 Other psychoactive substance abuse, uncomplicated: Secondary | ICD-10-CM | POA: Insufficient documentation

## 2011-10-25 DIAGNOSIS — S838X9A Sprain of other specified parts of unspecified knee, initial encounter: Secondary | ICD-10-CM | POA: Insufficient documentation

## 2011-10-25 DIAGNOSIS — S86119A Strain of other muscle(s) and tendon(s) of posterior muscle group at lower leg level, unspecified leg, initial encounter: Secondary | ICD-10-CM

## 2011-10-25 DIAGNOSIS — D649 Anemia, unspecified: Secondary | ICD-10-CM | POA: Insufficient documentation

## 2011-10-25 LAB — BASIC METABOLIC PANEL
Anion Gap: 10 (ref 5.0–15.0)
BUN: 9 mg/dL (ref 9.0–21.0)
CO2: 25 mEq/L (ref 22–29)
Calcium: 9.7 mg/dL (ref 8.5–10.5)
Chloride: 104 mEq/L (ref 98–107)
Creatinine: 0.8 mg/dL (ref 0.7–1.3)
Glucose: 96 mg/dL (ref 70–100)
Potassium: 4.5 mEq/L (ref 3.5–5.1)
Sodium: 139 mEq/L (ref 136–145)

## 2011-10-25 LAB — CBC AND DIFFERENTIAL
Basophils Absolute Automated: 0.03 10*3/uL (ref 0.00–0.20)
Basophils Automated: 0 % (ref 0–2)
Eosinophils Absolute Automated: 0.18 10*3/uL (ref 0.00–0.70)
Eosinophils Automated: 3 % (ref 0–5)
Hematocrit: 33.1 % — ABNORMAL LOW (ref 42.0–52.0)
Hgb: 11 g/dL — ABNORMAL LOW (ref 13.0–17.0)
Immature Granulocytes Absolute: 0.01 10*3/uL
Immature Granulocytes: 0 % (ref 0–1)
Lymphocytes Absolute Automated: 2.25 10*3/uL (ref 0.50–4.40)
Lymphocytes Automated: 34 % (ref 15–41)
MCH: 30.5 pg (ref 28.0–32.0)
MCHC: 33.2 g/dL (ref 32.0–36.0)
MCV: 91.7 fL (ref 80.0–100.0)
MPV: 10.3 fL (ref 9.4–12.3)
Monocytes Absolute Automated: 0.42 10*3/uL (ref 0.00–1.20)
Monocytes: 6 % (ref 0–11)
Neutrophils Absolute: 3.64 10*3/uL (ref 1.80–8.10)
Neutrophils: 56 % (ref 52–75)
Nucleated RBC: 0 /100 WBC (ref 0–1)
Platelets: 192 10*3/uL (ref 140–400)
RBC: 3.61 10*6/uL — ABNORMAL LOW (ref 4.70–6.00)
RDW: 13 % (ref 12–15)
WBC: 6.52 10*3/uL (ref 3.50–10.80)

## 2011-10-25 LAB — HEMOLYSIS INDEX: Hemolysis Index: 12 Index (ref 0–18)

## 2011-10-25 LAB — RAPID DRUG SCREEN, URINE
Barbiturate Screen, UR: NEGATIVE
Benzodiazepine Screen, UR: POSITIVE
Cannabinoid Screen, UR: NEGATIVE
Cocaine, UR: POSITIVE
Opiate Screen, UR: POSITIVE
PCP Screen, UR: NEGATIVE
Urine Amphetamine Screen: NEGATIVE

## 2011-10-25 LAB — GFR: EGFR: >60

## 2011-10-25 LAB — IHS D-DIMER: D-Dimer: 0.22 ug/mL FEU (ref 0.00–0.51)

## 2011-10-25 LAB — ETHANOL: Alcohol: NOT DETECTED mg/dL

## 2011-10-25 MED ORDER — IBUPROFEN 600 MG PO TABS
600.00 mg | ORAL_TABLET | Freq: Once | ORAL | Status: AC
Start: 2011-10-25 — End: 2011-10-25
  Administered 2011-10-25: 600 mg via ORAL
  Filled 2011-10-25: qty 1

## 2011-10-25 MED ORDER — HYDROCODONE-ACETAMINOPHEN 5-325 MG PO TABS
2.00 | ORAL_TABLET | Freq: Once | ORAL | Status: AC
Start: 2011-10-25 — End: 2011-10-25
  Administered 2011-10-25: 2 via ORAL
  Filled 2011-10-25: qty 2

## 2011-10-25 NOTE — ED Provider Notes (Signed)
Physician/Midlevel provider first contact with patient: 10/25/11 2054          Date: 10/25/2011  Patient Name: [name] [name]  Chief Complaint:   Chief Complaint   Patient presents with   . Leg Pain         History Provided By: the patient.  Translation services were not used.  Sign language services were not used.    HPI    History of Present Illness     Patient Identification  [name] [name] is a 46 y.o. male.    Patient information was obtained from patient.  History/Exam limitations: none.  Patient presented to the Emergency Department ambulatory.    Chief Complaint   Leg Pain      The patient complains of pain in the medial aspect of the left calfwithout radiation.  Pt does not know what caused the pain. Onset of symptoms was gradual starting 1 day ago. Patient describes pain as sharp/stabbing. Pain severity now is moderate. Pain is aggravated by movement and weight bearing. Pain is alleviated by nothing. Symptoms associated with pain include can't bear weight and swelling. The patient denies other injuries.  Care prior to arrival consisted of nothing, with no relief.  Pt sts he fell today when LLE "gave out."  Pt sts pain is worse today.  Pt sts he runs frequently but denies any injury.  Pt denies any prolonged immobilization.  Pt sts he has been taking his prescribed Dilaudid for one week w/o relief.            PCP: Referring, Not On File, MD    LMP: No LMP for male patient.    GYN HX:     Blood Type: APOS    PMH    Past Medical History   Diagnosis Date   . Heart murmur    . Gallstones    . Gastritis        PSH    History reviewed. No pertinent past surgical history.    FH    Family History   Problem Relation Age of Onset   . Diabetes Mother    . Hypertension Mother        SH    History   Substance Use Topics   . Smoking status: Current Everyday Smoker -- 1.0 packs/day for .5 years   . Smokeless tobacco: Not on file   . Alcohol Use: Yes      pt claims he "is not a drinker", admits to ETOH use tonight        ALLERGIES    Allergies   Allergen Reactions   . Percocet (Oxycodone-Acetaminophen)        CURRENT MEDS    Current Facility-Administered Medications   Medication Status Dose Route Frequency Provider Last Rate Last Dose   . HYDROcodone-acetaminophen (NORCO) 5-325 MG per tablet 2 tablet Completed  2 tablet Oral Once Harless Litten, MD   2 tablet at 10/25/11 2208   . ibuprofen (ADVIL,MOTRIN) tablet 600 mg Completed  600 mg Oral Once Harless Litten, MD   600 mg at 10/25/11 2208     Current Outpatient Prescriptions   Medication Status Sig Dispense Refill   . Ferrous Sulfate (IRON) 325 (65 FE) MG TABS Active Take 1 tablet by mouth daily.  30 each  0   . HYDROmorphone (DILAUDID) 2 MG tablet Active Take 1 tablet (2 mg total) by mouth every 6 (six) hours as needed for Pain (As needed for pain).  10 tablet  0   . naproxen (NAPROSYN) 250 MG tablet Active Take 2 tablets (500 mg total) by mouth 2 (two) times daily with meals.  20 tablet  0   . pantoprazole (PROTONIX) 40 MG tablet Active Take 1 tablet (40 mg total) by mouth daily.  30 tablet  0   . pantoprazole (PROTONIX) 40 MG tablet Active Take 1 tablet (40 mg total) by mouth daily.  30 tablet  0           ROS    Review of Systems   Constitutional: Negative for fever, activity change and fatigue.   HENT: Negative for ear pain, sore throat, rhinorrhea, neck pain and neck stiffness.    Eyes: Negative for pain, discharge and redness.   Respiratory: Negative for apnea, cough, chest tightness, shortness of breath, wheezing and stridor.    Cardiovascular: Positive for leg swelling. Negative for chest pain and palpitations.   Gastrointestinal: Negative for nausea, vomiting, abdominal pain, diarrhea, blood in stool and abdominal distention.   Musculoskeletal: Positive for joint swelling. Negative for myalgias, back pain and arthralgias.        Positive for fall (because LLE "gave out" today)   Skin: Negative for color change, pallor and rash.   Neurological: Negative for  dizziness, seizures, syncope, speech difficulty, weakness, numbness and headaches.   Hematological: Does not bruise/bleed easily.                 PHYSICAL EXAM    Vital Signs: BP 129/80  Pulse 70  Temp 98.8 F (37.1 C)  Resp 18  Ht 1.727 m  Wt 79.379 kg  BMI 26.61 kg/m2  SpO2 98%        Pulse Oximetry: 98%   (RA unless specified)          Physical Exam   Nursing note and vitals reviewed.  Constitutional: He is oriented to person, place, and time. He appears well-developed and well-nourished. He appears distressed (moaning in pain).        +ETOH on breath   HENT:   Head: Normocephalic and atraumatic.   Right Ear: External ear normal.   Left Ear: External ear normal.   Mouth/Throat: No oropharyngeal exudate.   Eyes: Conjunctivae normal and EOM are normal. Pupils are equal, round, and reactive to light.   Neck: Normal range of motion. Neck supple. No JVD present.   Cardiovascular: Normal rate, regular rhythm and normal heart sounds.    Pulmonary/Chest: Effort normal and breath sounds normal. No stridor. No respiratory distress. He has no wheezes. He has no rales. He exhibits no tenderness.   Abdominal: Soft. He exhibits no distension. There is no tenderness. There is no rebound and no guarding.   Musculoskeletal: Normal range of motion. He exhibits edema and tenderness.        LLE is edemetous and painful, especially in calf.  No cords but + Homan's.   Neurological: He is alert and oriented to person, place, and time. No cranial nerve deficit. Coordination normal.   Skin: Skin is warm and dry. No rash noted. He is not diaphoretic. No erythema. No pallor.   Psychiatric: He has a normal mood and affect. His behavior is normal. Judgment and thought content normal.            EKG/MONITOR/OLD EKG    Current EKG: not performed    Old EKG:   not reviewed or none available    Cardiac Monitor: not performed  LAB RESULTS    Results     Procedure Component Value Units Date/Time    Rapid drug screen, urine [[id number]]  Collected:10/25/11 2323    Specimen Information:Urine Updated:10/25/11 2346     Amphetamine Screen, UR Negative      Barbiturate Screen, UR Negative      Benzodiazepine Screen, UR Positive      Cannabinoid Screen, UR Negative      Cocaine, UR Positive      Opiate Screen, UR Positive      PCP Screen, UR Negative     Basic Metabolic Panel [[id number]] Collected:10/25/11 2227    Specimen Information:Blood Updated:10/25/11 2251     Glucose 96 mg/dL      BUN 9.0 mg/dL      Creatinine 0.8 mg/dL      Calcium 9.7 mg/dL      Sodium 782 mEq/L      Potassium 4.5 mEq/L      Chloride 104 mEq/L      CO2 25 mEq/L      Anion Gap 10.0     Ethanol (Alcohol) Level [[id number]] Collected:10/25/11 2227    Specimen Information:Blood Updated:10/25/11 2251     Alcohol None Detected mg/dL     HEMOLYZED INDEX [[id number]] Collected:10/25/11 2227     Hemolyzed Index 12 Index Updated:10/25/11 2251    GFR [[id number]] Collected:10/25/11 2227     EGFR >60.0 Updated:10/25/11 2251    D-Dimer [[id number]] Collected:10/25/11 2227     D-Dimer 0.22 ug/mL FEU Updated:10/25/11 2250    CBC and differential [[id number]]  (Abnormal) Collected:10/25/11 2227    Specimen Information:Blood Updated:10/25/11 2239     WBC 6.52 x10 3/uL      RBC 3.61 (L) x10 6/uL      Hgb 11.0 (L) g/dL      Hematocrit 59.5 (L) %      MCV 91.7 fL      MCH 30.5 pg      MCHC 33.2 g/dL      RDW 13 %      Platelets 192 x10 3/uL      MPV 10.3 fL      Neutrophils 56 %      Lymphocytes Automated 34 %      Monocytes 6 %      Eosinophils Automated 3 %      Basophils Automated 0 %      Immature Granulocyte 0 %      Nucleated RBC 0 /100 WBC      Neutrophils Absolute 3.64 x10 3/uL      Abs Lymph Automated 2.25 x10 3/uL      Abs Mono Automated 0.42 x10 3/uL      Abs Eos Automated 0.18 x10 3/uL      Absolute Baso Automated 0.03 x10 3/uL      Absolute Immature Granulocyte 0.01 x10 3/uL           RADIOLOGY RESULTS    Radiology Results (24 Hour)     Procedure Component Value Units Date/Time    US Venous  Low Extrem Duplx Dopp Uni Left [[id number]]     Order Status:Sent  Updated:10/25/11 2252    Knee 4+ Views Left [[id number]] Collected:10/25/11 2147    Order Status:Completed  Updated:10/25/11 2151    Narrative:    INDICATION: Pain following injury.     TECHNIQUE: 4 views of the left knee were obtained.     FINDINGS:  There is no evidence of fracture or dislocation.  No  significant soft tissue  abnormality is identified.       Impression:      No evidence of fracture.          CONSULTATIONS/NOTES    n/a    PROCEDURES    none    CRITICAL CARE TIME    none        DIAGNOSES  Primary diagnosis is in boldface    1. Gastrocnemius muscle strain    2. Polysubstance abuse    3. Anemia        DISPOSITION    discharged home     DISPOSITION CONDITION    improved    Patient Vitals for the past 12 hrs:   BP Temp Pulse Resp   10/26/11 0021 129/80 mmHg 98.8 F (37.1 C) 70  18    10/25/11 2026 138/80 mmHg 99.8 F (37.7 C) 104  19         Pulse Oximetry: 98%   (RA unless specified)      MEDICAL DECISION MAKING    Old records reviewed: not reviewed or none available    Old EKG reviewed: no    Discussed with consultant(s) and admitting MD: no    Discussed with patient and/or family members: yes  Discussed at length w/pt results and plan for discharge.  Pt verbalized agreement with plan for d/c.    Acuity level of diagnosis: low/not complex    Acuity level of procedures: not applicable    Complexity of differential diagnosis: low/not complex      CHART RECONCILIATION: CHART OWNERSHIP: Dr. Gaylord Shih is the primary emergency physician of record.                  Harless Litten, MD  10/26/11 [phone]

## 2011-10-25 NOTE — ED Provider Notes (Signed)
The pt was seen by the midlevel provider.  I discussed the treatment and the plan of care with the midlevel provider.  I agree with the treatment plan and assessment.      Jethro Bastos, MD  10/25/11 [phone]

## 2011-10-25 NOTE — ED Notes (Signed)
Patient complains of left calf pain since yesterday. Seen here for the same problem yesterday, patient cannot remember what we did for him at that time.

## 2011-10-25 NOTE — ED Provider Notes (Signed)
I discussed the history and physical findings with the midlevel provider and I agree with the plan as documented.    Maryella Shivers, DO  10/25/11 [phone]

## 2011-10-26 MED ORDER — IRON 325 (65 FE) MG PO TABS
1.00 | ORAL_TABLET | Freq: Every day | ORAL | Status: DC
Start: 2011-10-26 — End: 2014-03-29

## 2011-10-26 NOTE — Discharge Instructions (Signed)
Anemia    You have been diagnosed with Anemia.    Anemia is the medical term for "a low red blood cell count." Your blood is made up of red cells which carry oxygen, white blood cells which fight infection and platelets which help blood to clot.    Symptoms of anemia include fatigue (feeling tired), weakness, pallor (pale color of the skin, lips and fingernail beds), and shortness of breath or chest pain with exercise or even with normal daily activity.    Anemia can have many causes including the following:   Ongoing (continual) blood loss. Sometimes a slow "leak" of blood can occur into the bowels or by menstruation (menstrual period) and over time the blood loss adds up and your blood count can become abnormally low.   Iron deficiency: Iron is necessary for making new red blood cells. Sometimes a person s intake of iron is too low for their body s needs and their blood count becomes too low.   Vitamin deficiency: Vitamin B12 and folic acid are needed by the body to make new red blood cells. If your intake of these vitamins is too low, due to poor nutrition or excessive alcohol use, you can become anemic.   Chronic diseases: Some medical illnesses, especially those that involve generalized inflammation, can cause a low blood count.   Kidney disease: Patients who have long-term kidney problems can develop anemia.   Blood breakdown: Some diseases cause the blood cells in the blood stream to be destroyed or broken down. This can cause a low blood count.    The exact cause of your anemia is not known at this time. Some tests may have been done today to help figure out why you are anemic. The results will be available soon and you will need to see your primary care doctor or the referral physician for further evaluation.    Following an evaluation, the physician who treated you believes that your blood count IS NOT low enough to require a blood transfusion. You should follow-up with your regular doctor for  further checks of your blood count.    YOU SHOULD SEEK MEDICAL ATTENTION IMMEDIATELY, EITHER HERE OR AT THE NEAREST EMERGENCY DEPARTMENT, IF ANY OF THE FOLLOWING OCCURS:   You become light-headed and dizzy as if you were going to faint.   You develop worsening shortness of breath when you try to do normal daily activity, such as walking or climbing stairs.   You develop chest pain when you try to do normal daily activity, such as walking or climbing stairs.   IF YOU WERE BLEEDING.Marland KitchenMarland KitchenIf you have any increase in the bleeding.      Positive Toxicology Screening Test    You were found to have a positive toxicology screening test.     A toxicology screening test is also called a drug test or a drug screen. This means a sample of your urine or blood was sent to the lab and tested for drugs, both prescription and non-prescription. A positive test means that one or more drugs were found in your urine or blood. The tests can tell which drug(s) were present.    It is not possible to have a positive toxicology screening test from just "touching" certain drugs or medicines. It is not possible to have a positive test for marijuana (weed, pot) by just being in the room with other people who are smoking. Studies have been done on police officers who often handle certain drugs. The tests  show that toxicology screening tests are positive only for people who have taken the drug.     Taking illegal drugs or medication not prescribed to you can cause serious illness or injury. It can even end in death.     You may have a substance abuse problem. Having to come to the emergency department because of too much drinking or drugs shows you need help for substance abuse.    The symptoms are from a certain drug or drugs found in your blood or urine. Avoid drugs and alcohol. Only take medications prescribed to you.    YOU SHOULD SEEK MEDICAL ATTENTION IMMEDIATELY, EITHER HERE OR AT THE NEAREST EMERGENCY DEPARTMENT, IF ANY OF THE  FOLLOWING OCCUR:   You want or feel like you might hurt yourself or someone else.   You want or feel like you might kill yourself or someone else.   Your notice a change in behavior, you are more tired than normal or you have problems with balance or thinking.   You have problems breathing.    You lose consciousness or others find it hard to wake you up.   You have other concerns.    Clinics: [facility]- [place].  FREE CLINICS  (most have residency or income requirements)      [place]  [facility]  (must be a [facility] resident for 9 months)  [place] - [phone]  Eilleen Kempf - [address] area) - [phone]  [facility] available at [facility] only  [facility] - [phone]  Clinic in [facility] in [facility] ([address] [place]) [place] [phone]  [email or web address]; $45.00 fee  [address], [place], [place] - [phone]  [facility])  [address], [address]  [place], [place]. [address]  [phone]  [facility] - [phone]  Pediatrics - [phone]  [facility] - [phone]  [facility] [phone] [facility] [address] [phone]  [email or web address]; $20 initial visit; dental hygiene available  [phone]    [facility]  [facility] [phone]  [email or web address]; must be an [facility] resident for 1 year    [facility] - [phone]    [facility]  Must live in [place]  [place] [phone].) - [phone]  open Thursdays 4:30PM-9:00PM (may line up at 4:15PM)  dental clinic available  Faythe Dingwall [phone] [facility] [address] - [phone]  open Tuesdays  4:30PM-9:00PM  [facility] - [phone]  Must live in [place]    [facility])  [address], [address]  [place], [place]. [address]  [phone]

## 2011-10-31 ENCOUNTER — Emergency Department
Admission: EM | Admit: 2011-10-31 | Discharge: 2011-11-01 | Disposition: A | Discharge status: Home / Self Care | Payer: Medicaid Other | Attending: Emergency Medicine | Admitting: Emergency Medicine

## 2011-10-31 ENCOUNTER — Emergency Department: Payer: Medicaid Other

## 2011-10-31 DIAGNOSIS — Z862 Personal history of diseases of the blood and blood-forming organs and certain disorders involving the immune mechanism: Secondary | ICD-10-CM | POA: Insufficient documentation

## 2011-10-31 DIAGNOSIS — I10 Essential (primary) hypertension: Secondary | ICD-10-CM | POA: Insufficient documentation

## 2011-10-31 DIAGNOSIS — I809 Phlebitis and thrombophlebitis of unspecified site: Secondary | ICD-10-CM

## 2011-10-31 DIAGNOSIS — I8000 Phlebitis and thrombophlebitis of superficial vessels of unspecified lower extremity: Secondary | ICD-10-CM | POA: Insufficient documentation

## 2011-10-31 NOTE — ED Notes (Signed)
C/o severe varicose vein on left leg , he was d/c last Friday and advise to use crutches, pt fell  his left leg, pt now c/o left leg pain, denies LOC, pt took dilaudid total of 3 tab w/o relief last dose was an hour and half ago,

## 2011-11-01 ENCOUNTER — Emergency Department: Payer: Medicaid Other

## 2011-11-01 MED ORDER — MEDICAL COMPRESSION STOCKINGS MISC
2.00 [IU] | Status: DC | PRN
Start: 2011-11-01 — End: 2013-07-24

## 2011-11-01 MED ORDER — IBUPROFEN 400 MG PO TABS
800.00 mg | ORAL_TABLET | Freq: Once | ORAL | Status: AC
Start: 2011-11-01 — End: 2011-11-01
  Administered 2011-11-01: 800 mg via ORAL
  Filled 2011-11-01: qty 2

## 2011-11-01 MED ORDER — IBUPROFEN 800 MG PO TABS
800.00 mg | ORAL_TABLET | Freq: Three times a day (TID) | ORAL | Status: AC | PRN
Start: 2011-11-01 — End: 2011-11-11

## 2011-11-01 NOTE — ED Provider Notes (Addendum)
EMERGENCY DEPARTMENT HISTORY AND PHYSICAL EXAM     Physician/Midlevel provider first contact with patient: 11/01/11 0012         Date: 10/31/2011  Patient Name: [name] [name]    History of Presenting Illness     Chief Complaint   Patient presents with   . Leg Pain     left   . Varicose Veins     left leg       History Provided By: pt     Chief Complaint: leg pain, fall  Onset: today  Location: left  Severity: moderate  Modifying Factors: hx of HTN and anemia    Additional History: [name] [name] is a 46 y.o. male pt who presents for L leg pain after fall with associated sxs of swelling x today. Pt reports his varicose veins on his left leg is where the pain is located. Pt reports he was given crutches as a result but since he did not know how to use them he fell and hurt his leg more. Pt also reports hx of HTN and anemia.     PCP: Referring, Not On File, MD      Current Facility-Administered Medications   Medication Status Dose Route Frequency Provider Last Rate Last Dose   . ibuprofen (ADVIL,MOTRIN) tablet 800 mg Completed  800 mg Oral Once Kari Baars, MD   800 mg at 11/01/11 0121     Current Outpatient Prescriptions   Medication Status Sig Dispense Refill   . Elastic Bandages & Supports (MEDICAL COMPRESSION STOCKINGS) MISC Active 2 Unit by Does not apply route as needed.  2 each  0   . Ferrous Sulfate (IRON) 325 (65 FE) MG TABS Active Take 1 tablet by mouth daily.  30 each  0   . HYDROmorphone (DILAUDID) 2 MG tablet Active Take 1 tablet (2 mg total) by mouth every 6 (six) hours as needed for Pain (As needed for pain).  10 tablet  0   . ibuprofen (ADVIL,MOTRIN) 800 MG tablet Active Take 1 tablet (800 mg total) by mouth every 8 (eight) hours as needed for Pain or Fever.  30 tablet  0   . naproxen (NAPROSYN) 250 MG tablet Active Take 2 tablets (500 mg total) by mouth 2 (two) times daily with meals.  20 tablet  0   . pantoprazole (PROTONIX) 40 MG tablet Active Take 1 tablet (40 mg total) by mouth daily.  30  tablet  0   . pantoprazole (PROTONIX) 40 MG tablet Active Take 1 tablet (40 mg total) by mouth daily.  30 tablet  0       Past History     Past Medical History:  Past Medical History   Diagnosis Date   . Heart murmur    . Gallstones    . Gastritis        Past Surgical History:  History reviewed. No pertinent past surgical history.    Family History:  Family History   Problem Relation Age of Onset   . Diabetes Mother    . Hypertension Mother        Social History:  History   Substance Use Topics   . Smoking status: Current Everyday Smoker -- 1.0 packs/day for .5 years   . Smokeless tobacco: Not on file   . Alcohol Use: Yes      pt claims he "is not a drinker", admits to ETOH use tonight       Allergies:  Allergies  Allergen Reactions   . Percocet (Oxycodone-Acetaminophen)    . Tylenol (Acetaminophen) Hives     Tyelnol#3, hives and throw up       Review of Systems     Review of Systems   Constitutional: Negative for fever.   HENT: Negative for ear pain.    Eyes: Negative for pain.   Respiratory: Negative for shortness of breath.    Cardiovascular: Positive for leg swelling (L leg).   Gastrointestinal: Negative for abdominal pain.   Genitourinary: Negative for dysuria.   Musculoskeletal: Positive for falls.        L leg pain   Skin: Negative for rash.   Neurological: Negative for loss of consciousness and headaches.         Physical Exam   BP 107/60  Pulse 91  Temp 97.1 F (36.2 C)  Resp 18  Ht 1.727 m  Wt 77.111 kg  BMI 25.85 kg/m2  SpO2 98%    Physical Exam   Constitutional: He is oriented to person, place, and time and well-developed, well-nourished, and in no distress.   HENT:   Head: Normocephalic and atraumatic.   Eyes: EOM are normal.   Neck: Normal range of motion.   Pulmonary/Chest: Effort normal. No respiratory distress.   Musculoskeletal: Normal range of motion. He exhibits tenderness (L calf).   Neurological: He is alert and oriented to person, place, and time.   Skin: Skin is warm and dry.    Psychiatric: Mood, memory and affect normal.         Diagnostic Study Results     Labs -     Results     ** No Results found for the last 24 hours. **          Radiologic Studies -   Radiology Results (24 Hour)     Procedure Component Value Units Date/Time    US Venous Dopp Left Low Extrem [[id number]]     Order Status:Sent  Updated:11/01/11 0154      .      Medical Decision Making   I am the first provider for this patient.    I reviewed the vital signs, available nursing notes, past medical history, past surgical history, family history and social history.    Vital Signs-Reviewed the patient's vital signs.     Patient Vitals for the past 12 hrs:   BP Temp Pulse Resp   11/01/11 0156 107/60 mmHg - 91  18    10/31/11 2337 108/65 mmHg 97.1 F (36.2 C) 102  18        Pulse Oximetry Analysis - Normal 98% on ra    Old Medical Records: Nursing notes.     Provider Notes: 46 y/o M well know to ED, p/w L leg pain after fall while using crutches. Pt has TTP on post L calf on his varicose veins. Will u/s to r/o DVT, d/c home. No bony pain/tenderness.       Diagnosis     Clinical Impression:   1. Superficial thrombophlebitis        _______________________________    Attestations:  This note is prepared by Myrna Blazer, acting as Scribe for Catalina Pizza, MD.     Catalina Pizza , MD - The scribe's documentation has been prepared under my direction and personally reviewed by me in its entirety.  I confirm that the note above accurately reflects all work, treatment, procedures, and medical decision making performed by me.    _______________________________  Kari Baars, MD  11/01/11 0120    Kari Baars, MD  11/01/11 [phone]

## 2011-11-05 NOTE — Op Note (Signed)
OP NOTE    Date Time: 11/05/2011 5:31 AM    Patient Name:   [name] [name]    Date of Operation:   09/12/2011 - 09/19/2011    Providers Performing:   Surgeon(s):  Rihanna Marseille, Verlin Grills., MD    Assistant (s):   Privette, Skeet Latch, RN - Circulator  Dolores Frame, RN - Scrub Person    Operative Procedure:   Procedure(s):  EGD  EGD, BIOPSY    Preoperative Diagnosis:   Pre-Op Diagnosis Codes:     * GI bleed [578.9]    Postoperative Diagnosis:   duodenitis, gastritis, hiatal hernia    Anesthesia:   Monitor Anesthesia Care      Specimens:   Biopsy: YES    Findings:   After previous informed consent was obtained (including a description of risks, benefits, alterative procedures, and special risks appropriate to this patient) and appropriate history and physical performed, confirmation of patient ID with two identifiers was done, and the procedure was begun.  Throughout the procedure, continuous monitoring of the patient blood pressure, pulse, rhythm and oxygen saturation was done.    The Olympus video upper endoscope was inserted into the mouth and advanced to the second portion of duodenum with retroflexion in the cardia.  The esophagus, stomach, and duodenum appeared visually normal.  Biopsies were taken from the esophagus, stomach, and duodenum.  Patient sent to recovery area, discharged in good condition to the care of family, and given post-procedure ad follow-up instructions.    Complications:   none      Signed by: Leory Plowman., MD                                                                        [facility]

## 2011-11-06 ENCOUNTER — Emergency Department
Admission: EM | Admit: 2011-11-06 | Discharge: 2011-11-07 | Payer: Medicaid Other | Attending: Emergency Medicine | Admitting: Emergency Medicine

## 2011-11-06 ENCOUNTER — Emergency Department: Payer: Medicaid Other

## 2011-11-06 DIAGNOSIS — F17200 Nicotine dependence, unspecified, uncomplicated: Secondary | ICD-10-CM | POA: Insufficient documentation

## 2011-11-06 DIAGNOSIS — R1110 Vomiting, unspecified: Secondary | ICD-10-CM | POA: Insufficient documentation

## 2011-11-06 LAB — CBC AND DIFFERENTIAL
Basophils Absolute Automated: 0.03 10*3/uL (ref 0.00–0.20)
Basophils Automated: 0 % (ref 0–2)
Eosinophils Absolute Automated: 0.01 10*3/uL (ref 0.00–0.70)
Eosinophils Automated: 0 % (ref 0–5)
Hematocrit: 40.8 % — ABNORMAL LOW (ref 42.0–52.0)
Hgb: 14.7 g/dL (ref 13.0–17.0)
Immature Granulocytes Absolute: 0.06 10*3/uL — ABNORMAL HIGH
Immature Granulocytes: 0 % (ref 0–1)
Lymphocytes Absolute Automated: 2.29 10*3/uL (ref 0.50–4.40)
Lymphocytes Automated: 14 % — ABNORMAL LOW (ref 15–41)
MCH: 31.3 pg (ref 28.0–32.0)
MCHC: 36 g/dL (ref 32.0–36.0)
MCV: 86.8 fL (ref 80.0–100.0)
MPV: 10.6 fL (ref 9.4–12.3)
Monocytes Absolute Automated: 1.79 10*3/uL — ABNORMAL HIGH (ref 0.00–1.20)
Monocytes: 11 % (ref 0–11)
Neutrophils Absolute: 12.83 10*3/uL — ABNORMAL HIGH (ref 1.80–8.10)
Neutrophils: 76 % — ABNORMAL HIGH (ref 52–75)
Nucleated RBC: 0 /100 WBC (ref 0–1)
Platelets: 219 10*3/uL (ref 140–400)
RBC: 4.7 10*6/uL (ref 4.70–6.00)
RDW: 12 % (ref 12–15)
WBC: 16.95 10*3/uL — ABNORMAL HIGH (ref 3.50–10.80)

## 2011-11-06 LAB — BASIC METABOLIC PANEL
Anion Gap: 15 (ref 5.0–15.0)
BUN: 9 mg/dL (ref 9.0–21.0)
CO2: 24 mEq/L (ref 22–29)
Calcium: 10.5 mg/dL (ref 8.5–10.5)
Chloride: 97 mEq/L — ABNORMAL LOW (ref 98–107)
Creatinine: 0.8 mg/dL (ref 0.7–1.3)
Glucose: 97 mg/dL (ref 70–100)
Potassium: 3.7 mEq/L (ref 3.5–5.1)
Sodium: 136 mEq/L (ref 136–145)

## 2011-11-06 LAB — HEMOLYSIS INDEX: Hemolysis Index: 14 Index (ref 0–18)

## 2011-11-06 LAB — GFR: EGFR: >60

## 2011-11-06 MED ORDER — SODIUM CHLORIDE 0.9 % IV BOLUS
1000.00 mL | Freq: Once | INTRAVENOUS | Status: AC
Start: 2011-11-06 — End: 2011-11-06
  Administered 2011-11-06: 1000 mL via INTRAVENOUS

## 2011-11-06 MED ORDER — ONDANSETRON 4 MG PO TBDP
4.00 mg | ORAL_TABLET | Freq: Four times a day (QID) | ORAL | Status: AC | PRN
Start: 2011-11-06 — End: 2011-11-13

## 2011-11-06 MED ORDER — ONDANSETRON HCL 4 MG/2ML IJ SOLN
4.00 mg | Freq: Once | INTRAMUSCULAR | Status: AC
Start: 2011-11-06 — End: 2011-11-06
  Administered 2011-11-06: 4 mg via INTRAVENOUS
  Filled 2011-11-06: qty 2

## 2011-11-06 NOTE — ED Notes (Addendum)
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 Pt is a&ox2.  Oriented to self and place. Pt appears in pain.  PTA given promethazine 25mg .

## 2011-11-07 ENCOUNTER — Emergency Department: Payer: Medicaid Other

## 2011-11-07 MED ORDER — IOHEXOL 350 MG/ML IV SOLN
100.00 mL | Freq: Once | INTRAVENOUS | Status: AC | PRN
Start: 2011-11-07 — End: 2011-11-07
  Administered 2011-11-07: 100 mL via INTRAVENOUS

## 2011-11-07 MED ORDER — ONDANSETRON 4 MG PO TBDP
4.00 mg | ORAL_TABLET | Freq: Four times a day (QID) | ORAL | Status: AC | PRN
Start: 2011-11-07 — End: 2011-11-14

## 2011-11-07 NOTE — ED Provider Notes (Signed)
The pt was seen by the midlevel provider.  I discussed the treatment and the plan of care with the midlevel provider.  I agree with the treatment plan and assessment.      Jethro Bastos, MD  11/07/11 [phone]

## 2011-11-07 NOTE — ED Provider Notes (Signed)
EMERGENCY DEPARTMENT HISTORY AND PHYSICAL EXAM    Date: 11/06/2011  Patient Name: [name] [name]  Attending Physician Assistant:  Henry Russel, P.A.-C    History of Presenting Illness     Chief Complaint   Patient presents with   . Withdrawal     heroin       History Provided By: pt    Chief Complaint: vomiting  Onset: 3 days ago  Timing: gradual  Location: abd  Quality: retching  Severity: moderate  Modifying Factors: heroin use    Additional History: [name] [name] is a 46 y.o. male who is incarcerated.  Pt reports that he has been vomiting for the last 3 days and cannot keep anything down.  Pt states that his abd is also hurting.  No diarrhea.  Per sheriff dept, pt is coming off of heroin.      PCP: Referring, Not On File, MD      Current Facility-Administered Medications   Medication Status Dose Route Frequency Provider Last Rate Last Dose   . iohexol (OMNIPAQUE) 350 MG/ML injection 100 mL Completed  100 mL Intravenous ONCE PRN Jethro Bastos, MD   100 mL at 11/07/11 0036   . ondansetron (ZOFRAN) injection 4 mg Completed  4 mg Intravenous Once Henry Russel Dyan, PA   4 mg at 11/06/11 2237   . sodium chloride 0.9 % bolus 1,000 mL Completed  1,000 mL Intravenous Once Henry Russel Dyan, PA   1,000 mL at 11/06/11 2230     Current Outpatient Prescriptions   Medication Status Sig Dispense Refill   . Elastic Bandages & Supports (MEDICAL COMPRESSION STOCKINGS) MISC Active 2 Unit by Does not apply route as needed.  2 each  0   . Ferrous Sulfate (IRON) 325 (65 FE) MG TABS Active Take 1 tablet by mouth daily.  30 each  0   . ibuprofen (ADVIL,MOTRIN) 800 MG tablet Active Take 1 tablet (800 mg total) by mouth every 8 (eight) hours as needed for Pain or Fever.  30 tablet  0   . naproxen (NAPROSYN) 250 MG tablet Active Take 2 tablets (500 mg total) by mouth 2 (two) times daily with meals.  20 tablet  0   . ondansetron (ZOFRAN ODT) 4 MG disintegrating tablet Active Take 1 tablet (4 mg total) by mouth every 6 (six) hours  as needed for Nausea.  8 tablet  0   . ondansetron (ZOFRAN ODT) 4 MG disintegrating tablet Active Take 1 tablet (4 mg total) by mouth every 6 (six) hours as needed for Nausea.  8 tablet  0   . pantoprazole (PROTONIX) 40 MG tablet Active Take 1 tablet (40 mg total) by mouth daily.  30 tablet  0   . pantoprazole (PROTONIX) 40 MG tablet Active Take 1 tablet (40 mg total) by mouth daily.  30 tablet  0       Past Medical History     Past Medical History   Diagnosis Date   . Heart murmur    . Gallstones    . Gastritis      History reviewed. No pertinent past surgical history.    Family History     Family History   Problem Relation Age of Onset   . Diabetes Mother    . Hypertension Mother        Social History     History   Substance Use Topics   . Smoking status: Current Everyday Smoker -- 1.0 packs/day for .5 years   .  Smokeless tobacco: Not on file   . Alcohol Use: Yes      pt claims he "is not a drinker", admits to ETOH use tonight       Allergies     Allergies   Allergen Reactions   . Gluten      Pt states "white bread"   . Percocet (Oxycodone-Acetaminophen)    . Tylenol (Acetaminophen) Hives     Tyelnol#3, hives and throw up       Review of Systems     Review of Systems   Constitutional: Negative for fever and chills.   HENT: Negative for congestion.    Respiratory: Negative for cough and shortness of breath.    Cardiovascular: Negative for chest pain and palpitations.   Gastrointestinal: Positive for nausea, vomiting and abdominal pain. Negative for diarrhea and constipation.   Genitourinary: Negative for hematuria.   Neurological: Positive for weakness and headaches. Negative for dizziness and sensory change.   Psychiatric/Behavioral: Negative for depression. The patient does not have insomnia.        Physical Exam   BP 168/95  Pulse 56  Temp(Src) 99.9 F (37.7 C) (Oral)  Resp 22  SpO2 99%    Physical Exam   Nursing note and vitals reviewed.  Constitutional: He is oriented to person, place, and time and  well-developed, well-nourished, and in no distress.   HENT:   Head: Normocephalic and atraumatic.   Eyes: Pupils are equal, round, and reactive to light.   Neck: Normal range of motion. Neck supple.   Cardiovascular: Normal rate and regular rhythm.    Pulmonary/Chest: Effort normal and breath sounds normal.   Abdominal: Soft. Bowel sounds are normal. There is Tenderness: right lowe tenderness, minimal.. There is no rebound and no guarding.   Musculoskeletal: Normal range of motion.   Neurological: He is alert and oriented to person, place, and time.   Skin: Skin is warm and dry.   Psychiatric: Affect normal.       Diagnostic Study Results     Labs -     Results     Procedure Component Value Units Date/Time    Basic Metabolic Panel (BMP) [[id number]]  (Abnormal) Collected:11/06/11 2240    Specimen Information:Blood Updated:11/06/11 2306     Glucose 97 mg/dL      BUN 9.0 mg/dL      Creatinine 0.8 mg/dL      Calcium 40.9 mg/dL      Sodium 811 mEq/L      Potassium 3.7 mEq/L      Chloride 97 (L) mEq/L      CO2 24 mEq/L      Anion Gap 15.0     HEMOLYZED INDEX [[id number]] Collected:11/06/11 2240     Hemolyzed Index 14 Index Updated:11/06/11 2306    GFR [[id number]] Collected:11/06/11 2240     EGFR >60.0 Updated:11/06/11 2306    CBC and differential [[id number]]  (Abnormal) Collected:11/06/11 2240    Specimen Information:Blood Updated:11/06/11 2253     WBC 16.95 (H) x10 3/uL      RBC 4.70 x10 6/uL      Hgb 14.7 g/dL      Hematocrit 41.3 (L) %      MCV 86.8 fL      MCH 31.3 pg      MCHC 36.0 g/dL      RDW 12 %      Platelets 219 x10 3/uL      MPV 10.6 fL      Neutrophils 76 (  H) %      Lymphocytes Automated 14 (L) %      Monocytes 11 %      Eosinophils Automated 0 %      Basophils Automated 0 %      Immature Granulocyte 0 %      Nucleated RBC 0 /100 WBC      Neutrophils Absolute 12.83 (H) x10 3/uL      Abs Lymph Automated 2.29 x10 3/uL      Abs Mono Automated 1.79 (H) x10 3/uL      Abs Eos Automated 0.01 x10 3/uL      Absolute  Baso Automated 0.03 x10 3/uL      Absolute Immature Granulocyte 0.06 (H) x10 3/uL           Radiologic Studies -   Radiology Results (24 Hour)     Procedure Component Value Units Date/Time    CT Abd/ Pelvis with IV Contrast [[id number]] Collected:11/07/11 0037    Order Status:Completed  Updated:11/07/11 0057    Narrative:    Clinical history:Right abd pain     45 years, male    11/07/2011 12:37 AM    CT abdomen with contrast, CT pelvis with contrast    Helical images were obtained through the abdomen and pelvis.  Intravenous contrast was administered.  Oral contrast was not administered.  Coronal and sagittal  images were reformatted from axial data.    Motion artifact degrades image quality.    Heart size is normal.  There is no pericardial effusion.  There is no focal consolidation.    Liver is unremarkable.  Gallbladder is collapsed.  Spleen is unremarkable.  Pancreas is unremarkable.  There is no intra-or extrahepatic ductal dilatation.  Adrenals are unremarkable.  Right kidney is unremarkable.  There is a nonobstructing left renal stone.  There is no pelvocaliectasis or ureterectasis.    Urinary bladder is normal in appearance.  Seminal vesicles and prostate have the expected configuration.  There is no free fluid.    Stomach is partially distended.  Bowel rotation is normal.  Proximal small bowel is mildly distended with fluid and air.  There is mild small bowel wall thickening.  Distal small bowel is relatively decompressed.  Appendix is normal.  Colon is partially distended with stool and air.      There is no free air.  Vascular structures are unremarkable.  There is shotty adenopathy.  Bony structures are unremarkable.      Impression:    IMPRESSION: Mildly dilated small bowel with wall thickening suggesting   enteritis; no acute solid visceral abnormality; non obstructing left renal stone          .    Clinical Course in the Emergency Department     Consultations:      Re-evaluations:  Pt is calm in room.   No vomiting since zofran.  Will [place] back to jail.      Medical Decision Making   I am the first provider for this patient.    I reviewed the vital signs, available nursing notes, past medical history, past surgical history, family history and social history.    Vital Signs-Reviewed the patient's vital signs.     Patient Vitals for the past 12 hrs:   BP Temp Pulse Resp   11/06/11 2328 168/95 mmHg - 56  22    11/06/11 2227 185/105 mmHg - 61  28    11/06/11 2147 177/88 mmHg 99.9 F (37.7 C) 70  24  Pulse Oximetry Analysis - Normal 99% on ra    Old Medical Records: Nursing notes.         Diagnosis and Treatment Plan       Clinical Impression:   1. Vomiting        _______________________________    Henry Russel Dyan, PA  11/07/11 [phone]

## 2013-05-22 IMAGING — CT CT ABD-PELV W/ CM
2 of 5 series · 16 of 46 positions shown, 18 images · IV contrast (Omnipaque 300)
Comparison: None.

CLINICAL DATA: Abdominal pain after sudden withdrawal of heroin.

CT ABDOMEN AND PELVIS WITH CONTRAST
TECHNIQUE: Multidetector CT imaging of the abdomen and pelvis was
performed following the standard protocol during bolus
administration of intravenous contrast.
Contrast: 100mL OMNIPAQUE IOHEXOL 300 MG/ML  SOLN

[Series 2: abd_pel_with 5.0 b40f · axial · 0.70mm/px · z∈[-428,-53]mm · 13 of 85 slices shown, 15 images]
[im 5/85  soft-tissue]
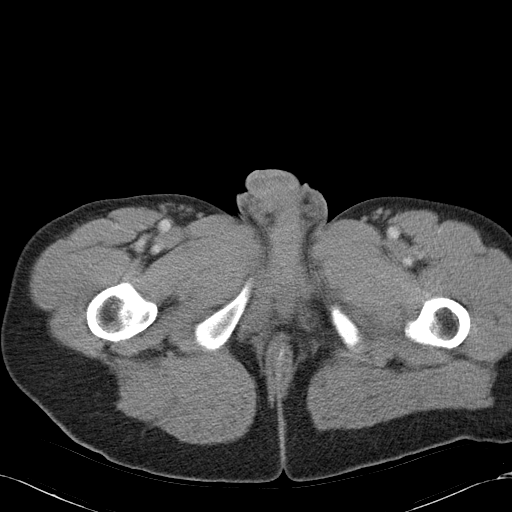
[im 5/85  bone]
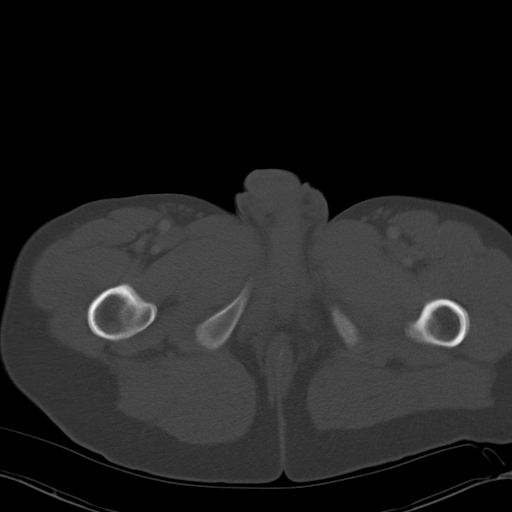
[im 13/85  soft-tissue]
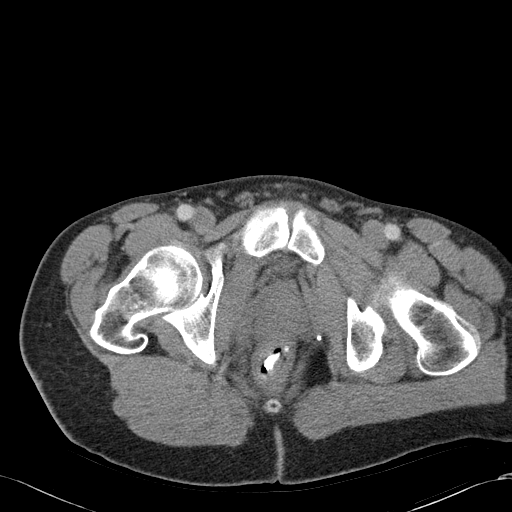
[im 17/85  soft-tissue]
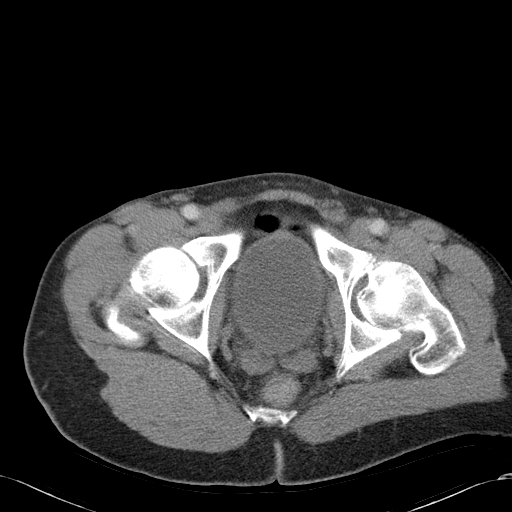
[im 26/85  soft-tissue]
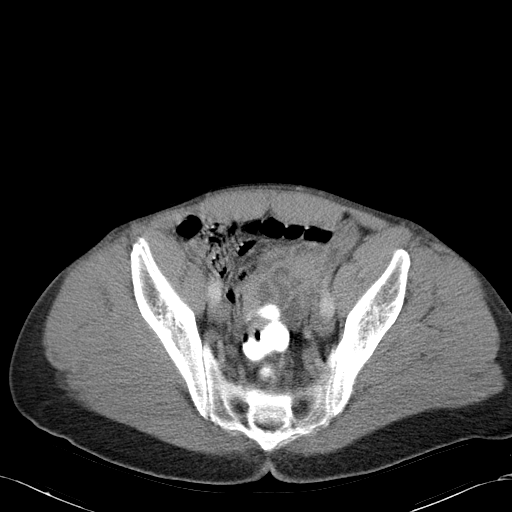
[im 30/85  soft-tissue]
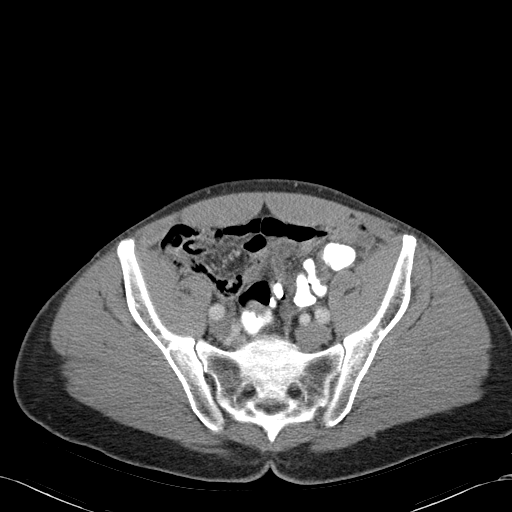
[im 38/85  soft-tissue]
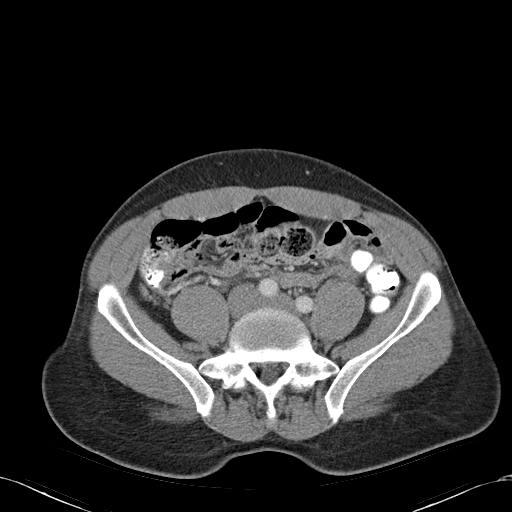
[im 43/85  soft-tissue]
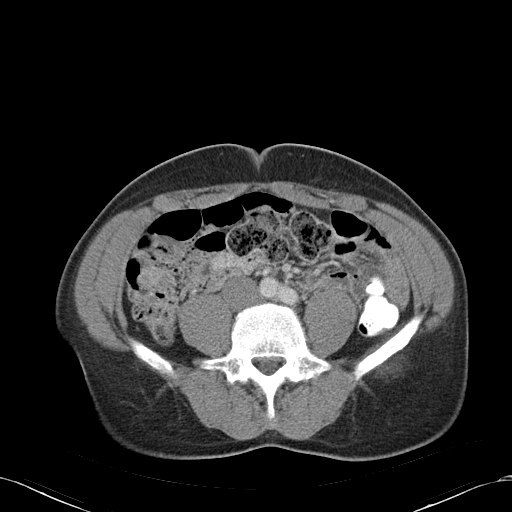
[im 47/85  soft-tissue]
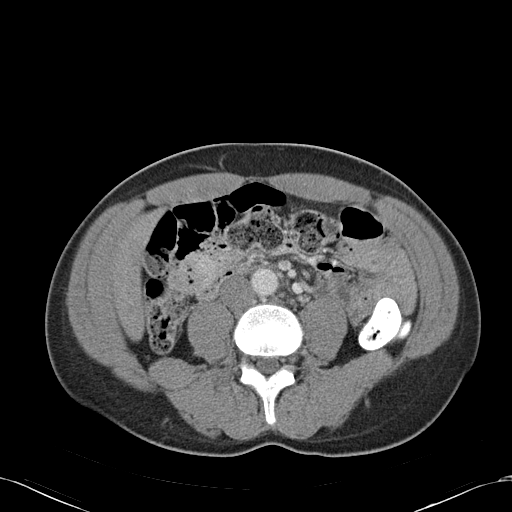
[im 55/85  soft-tissue]
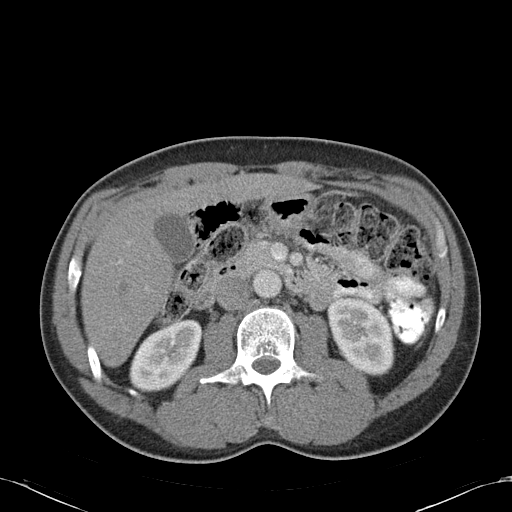
[im 55/85  bone]
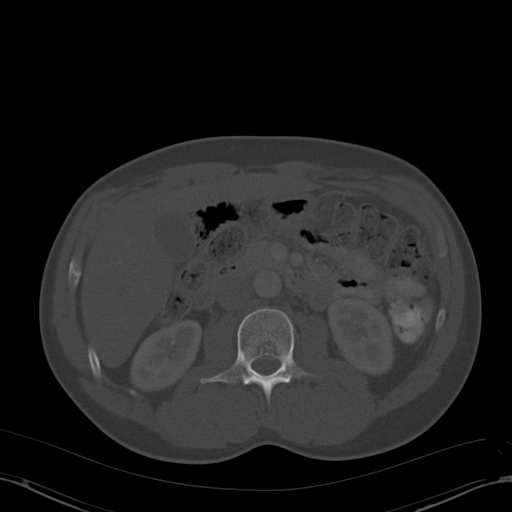
[im 59/85  soft-tissue]
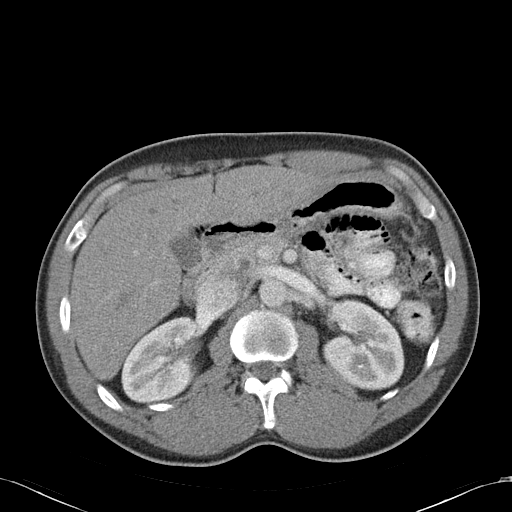
[im 68/85  soft-tissue]
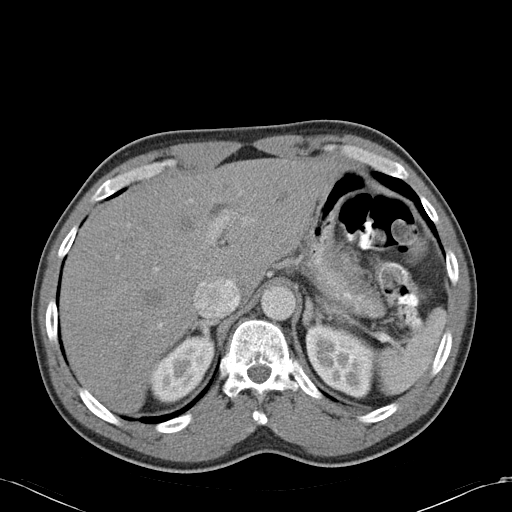
[im 72/85  soft-tissue]
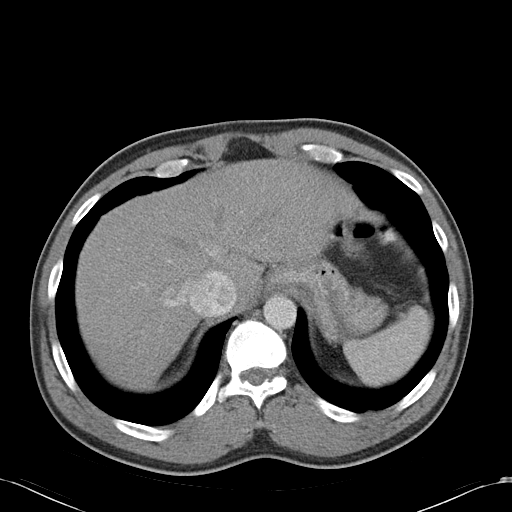
[im 80/85  soft-tissue]
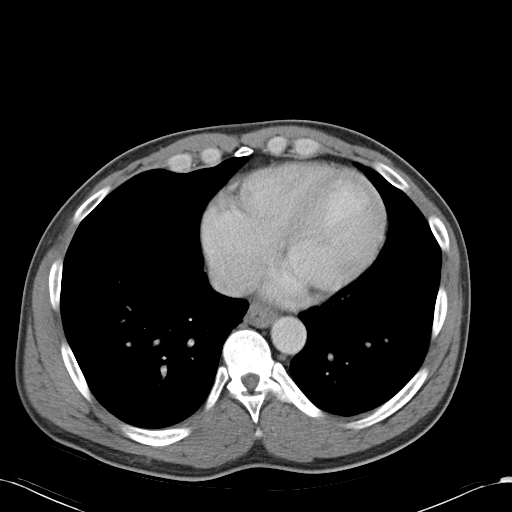

[Series 4: abd_pel_with 3.0 spo cor · coronal · 0.59mm/px · 3 of 71 slices shown]
[im 24/71  soft-tissue]
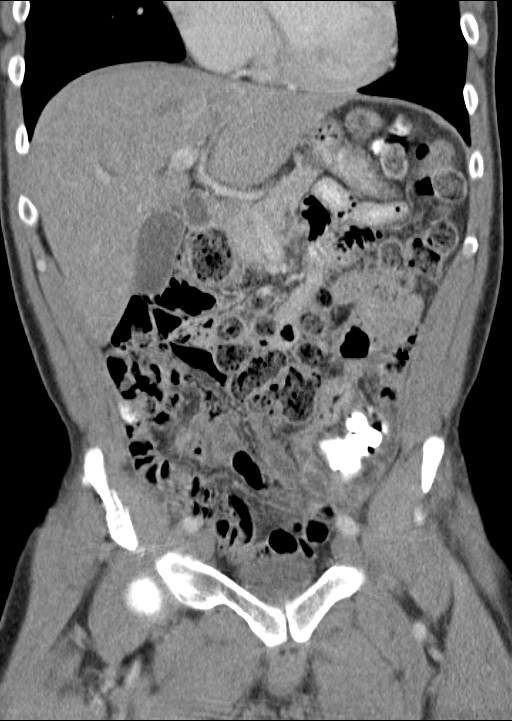
[im 32/71  soft-tissue]
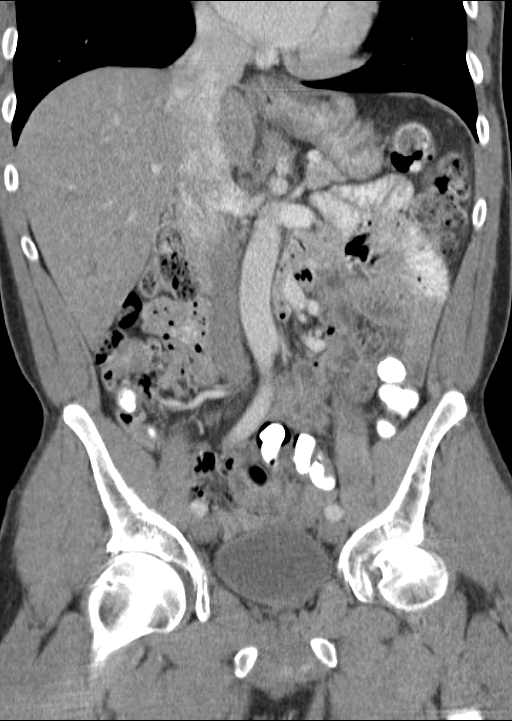
[im 39/71  soft-tissue]
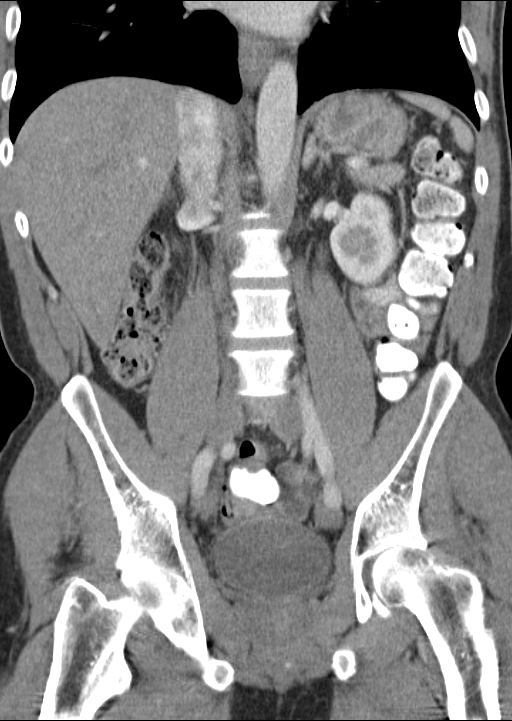

[16 of 46 positions shown; findings below may reference images not displayed]

FINDINGS: Small hiatal hernia.  5 mm calculus in the mid to lower
left kidney.  No bladder or ureteral calculi and no hydronephrosis.
Normal appearing right kidney.

Normal appearing liver, spleen, pancreas, gallbladder, adrenal
glands and prostate gland.  No gastrointestinal abnormalities or
enlarged lymph nodes.  Clear lung bases.  Normal appearing bones.
IMPRESSION: 1.  Small hiatal hernia.
2.  5 mm nonobstructing left renal calculus.
3.  No acute abnormality.

## 2013-07-21 ENCOUNTER — Emergency Department: Payer: Self-pay

## 2013-07-21 ENCOUNTER — Emergency Department
Admission: EM | Admit: 2013-07-21 | Discharge: 2013-07-21 | Disposition: A | Discharge status: Home / Self Care | Payer: Self-pay | Attending: Emergency Medicine | Admitting: Emergency Medicine

## 2013-07-21 DIAGNOSIS — L299 Pruritus, unspecified: Secondary | ICD-10-CM

## 2013-07-21 DIAGNOSIS — R42 Dizziness and giddiness: Secondary | ICD-10-CM | POA: Insufficient documentation

## 2013-07-21 DIAGNOSIS — F1410 Cocaine abuse, uncomplicated: Secondary | ICD-10-CM | POA: Insufficient documentation

## 2013-07-21 DIAGNOSIS — R109 Unspecified abdominal pain: Secondary | ICD-10-CM

## 2013-07-21 DIAGNOSIS — F1110 Opioid abuse, uncomplicated: Secondary | ICD-10-CM | POA: Insufficient documentation

## 2013-07-21 DIAGNOSIS — R519 Headache, unspecified: Secondary | ICD-10-CM

## 2013-07-21 DIAGNOSIS — K2990 Gastroduodenitis, unspecified, without bleeding: Secondary | ICD-10-CM | POA: Insufficient documentation

## 2013-07-21 DIAGNOSIS — J329 Chronic sinusitis, unspecified: Secondary | ICD-10-CM | POA: Insufficient documentation

## 2013-07-21 DIAGNOSIS — R51 Headache: Secondary | ICD-10-CM | POA: Insufficient documentation

## 2013-07-21 DIAGNOSIS — K2970 Gastritis, unspecified, without bleeding: Secondary | ICD-10-CM | POA: Insufficient documentation

## 2013-07-21 LAB — COMPREHENSIVE METABOLIC PANEL
ALT: 13 U/L (ref 0–55)
AST (SGOT): 22 U/L (ref 5–34)
Albumin/Globulin Ratio: 1.3 (ref 0.9–2.2)
Albumin: 4 g/dL (ref 3.5–5.0)
Alkaline Phosphatase: 66 U/L (ref 38–106)
Anion Gap: 8 (ref 5.0–15.0)
BUN: 13 mg/dL (ref 9.0–28.0)
Bilirubin, Total: 1.2 mg/dL (ref 0.2–1.2)
CO2: 26 mEq/L (ref 22–29)
Calcium: 9.7 mg/dL (ref 8.5–10.5)
Chloride: 106 mEq/L (ref 100–111)
Creatinine: 1.1 mg/dL (ref 0.7–1.3)
Globulin: 3.2 g/dL (ref 2.0–3.6)
Glucose: 109 mg/dL — ABNORMAL HIGH (ref 70–100)
Potassium: 4.1 mEq/L (ref 3.5–5.1)
Protein, Total: 7.2 g/dL (ref 6.0–8.3)
Sodium: 140 mEq/L (ref 136–145)

## 2013-07-21 LAB — POCT URINALYSIS AUTOMATED (IAH)
Bilirubin, UA POCT: NEGATIVE
Blood, UA POCT: NEGATIVE
Glucose, UA POCT: NEGATIVE
Ketones, UA POCT: NEGATIVE mg/dL
Nitrite, UA POCT: NEGATIVE
PH, UA POCT: 6 (ref 4.6–8)
Protein, UA POCT: NEGATIVE mg/dL
Specific Gravity, UA POCT: 1.01 mg/dL (ref 1.001–1.035)
Urine Leukocytes POCT: NEGATIVE
Urobilinogen, UA POCT: 0.2 mg/dL

## 2013-07-21 LAB — CBC AND DIFFERENTIAL
Basophils Absolute Automated: 0.02 10*3/uL (ref 0.00–0.20)
Basophils Automated: 0 %
Eosinophils Absolute Automated: 0.33 10*3/uL (ref 0.00–0.70)
Eosinophils Automated: 5 %
Hematocrit: 37.7 % — ABNORMAL LOW (ref 42.0–52.0)
Hgb: 12.9 g/dL — ABNORMAL LOW (ref 13.0–17.0)
Immature Granulocytes Absolute: 0.01 10*3/uL
Immature Granulocytes: 0 %
Lymphocytes Absolute Automated: 2.15 10*3/uL (ref 0.50–4.40)
Lymphocytes Automated: 32 %
MCH: 30.5 pg (ref 28.0–32.0)
MCHC: 34.2 g/dL (ref 32.0–36.0)
MCV: 89.1 fL (ref 80.0–100.0)
MPV: 10.2 fL (ref 9.4–12.3)
Monocytes Absolute Automated: 0.4 10*3/uL (ref 0.00–1.20)
Monocytes: 6 %
Neutrophils Absolute: 3.93 10*3/uL (ref 1.80–8.10)
Neutrophils: 58 %
Nucleated RBC: 0 /100 WBC (ref 0–1)
Platelets: 166 10*3/uL (ref 140–400)
RBC: 4.23 10*6/uL — ABNORMAL LOW (ref 4.70–6.00)
RDW: 13 % (ref 12–15)
WBC: 6.83 10*3/uL (ref 3.50–10.80)

## 2013-07-21 LAB — RPR QUANTITATIVE: RPR Quantitative: 1:4 {titer} — AB

## 2013-07-21 LAB — GFR: EGFR: >60

## 2013-07-21 LAB — LIPASE: Lipase: 22 U/L (ref 8–78)

## 2013-07-21 LAB — RPR: RPR: REACTIVE — AB

## 2013-07-21 LAB — HEMOLYSIS INDEX: Hemolysis Index: 11 (ref 0–18)

## 2013-07-21 MED ORDER — AMOXICILLIN-POT CLAVULANATE 875-125 MG PO TABS
1.00 | ORAL_TABLET | Freq: Two times a day (BID) | ORAL | Status: AC
Start: 2013-07-21 — End: 2013-07-28

## 2013-07-21 MED ORDER — PERMETHRIN 5 % EX CREA
TOPICAL_CREAM | Freq: Once | CUTANEOUS | Status: AC
Start: 2013-07-21 — End: 2013-07-21

## 2013-07-21 MED ORDER — SODIUM CHLORIDE 0.9 % IV BOLUS
1000.00 mL | Freq: Once | INTRAVENOUS | Status: AC
Start: 2013-07-21 — End: 2013-07-21
  Administered 2013-07-21: 1000 mL via INTRAVENOUS

## 2013-07-21 MED ORDER — OMEPRAZOLE MAGNESIUM 20 MG PO TBEC
20.00 mg | DELAYED_RELEASE_TABLET | Freq: Every day | ORAL | Status: DC
Start: 2013-07-21 — End: 2014-03-29

## 2013-07-21 MED ORDER — ONDANSETRON 4 MG PO TBDP
4.00 mg | ORAL_TABLET | Freq: Four times a day (QID) | ORAL | Status: DC | PRN
Start: 2013-07-21 — End: 2013-07-23

## 2013-07-21 MED ORDER — DIPHENHYDRAMINE HCL 25 MG PO CAPS
25.00 mg | ORAL_CAPSULE | Freq: Once | ORAL | Status: AC
Start: 2013-07-21 — End: 2013-07-21
  Administered 2013-07-21: 25 mg via ORAL
  Filled 2013-07-21: qty 1

## 2013-07-21 MED ORDER — ONDANSETRON HCL 4 MG/2ML IJ SOLN
4.00 mg | Freq: Once | INTRAMUSCULAR | Status: AC
Start: 2013-07-21 — End: 2013-07-21
  Administered 2013-07-21: 4 mg via INTRAVENOUS
  Filled 2013-07-21: qty 2

## 2013-07-21 MED ORDER — ALUM & MAG HYDROXIDE-SIMETH 200-200-20 MG/5ML PO SUSP
30.00 mL | Freq: Once | ORAL | Status: AC
Start: 2013-07-21 — End: 2013-07-21
  Administered 2013-07-21: 30 mL via ORAL
  Filled 2013-07-21: qty 30

## 2013-07-21 NOTE — Discharge Instructions (Signed)
Drink plenty of fluids.    No acidic foods, tomatoes, coffee, alcohol, chocolates, as this can worsen acid in your stomach.    Take maalox or Tums as needed for heartburn.     See a GI doctor for follow up within 1 week.    Take benadryl as needed for itchiness. See the dermatologist as needed.    Return to the ED for any fevers, worsening pain, or any concerns.

## 2013-07-21 NOTE — ED Provider Notes (Addendum)
EMERGENCY DEPARTMENT HISTORY AND PHYSICAL EXAM     Physician/Midlevel provider first contact with patient: 07/21/13 1515         Date: 07/21/2013  Patient Name: [name] [name]    History of Presenting Illness     Chief Complaint   Patient presents with   . Rash   . Abdominal Pain   . Dizziness       History Provided By: Pt    Chief Complaint: mid abdominal pain  Onset: one year, worse today  Timing: intermittent, persistent   Location: mid abdomen   Quality:   Severity:   Modifying Factors: none  Associated Symptoms: nausea, generalized itchiness, rash on R arm and knees, HA, dizziness, blurred vision    Additional History: [name] [name] is a 47 y.o. male with hx of gastritis, duodenitis, gallstones, and cocaine/heroin abuse presenting to the ED for intermittent and persistent mid abdominal pain with nausea since one year ago, worse today. Pt states he thought it was "just acid reflux" one year ago. He does not know whether he was dx with H. Pylori, but was given amoxicillin and erythromycin for 10 days (only taken for 6 days) one year ago. Pt states current episode is "more intense." He also reports persisting generalized itchiness since October, 2014. He was tx on permethrin and notes a rash on his R arm and knees. Pt also c/o HA, dizziness, and blurred vision that started last week, worse the last couple of days. He has not taken medication for the HA. Pt notes no sexual activity in the past 6 months. He reports no recent drug use (clean for over 2 years). Pt denies fever, cough, CP, SOB, V/D, back pain, dysuria, penile discharge, or any other ill symptoms.     PCP: Pcp, Noneorunknown, MD      No current facility-administered medications for this encounter.     Current Outpatient Prescriptions   Medication Sig Dispense Refill   . amoxicillin-clavulanate (AUGMENTIN) 875-125 MG per tablet Take 1 tablet by mouth 2 (two) times daily.  14 tablet  0   . Elastic Bandages & Supports (MEDICAL COMPRESSION STOCKINGS) MISC 2  Unit by Does not apply route as needed.  2 each  0   . Ferrous Sulfate (IRON) 325 (65 FE) MG TABS Take 1 tablet by mouth daily.  30 each  0   . omeprazole (PRILOSEC OTC) 20 MG tablet Take 1 tablet (20 mg total) by mouth daily.  30 tablet  0   . ondansetron (ZOFRAN ODT) 4 MG disintegrating tablet Take 1 tablet (4 mg total) by mouth every 6 (six) hours as needed for Nausea.  8 tablet  0   . permethrin (ELIMITE) 5 % cream Apply topically once. Keep on for 12 hours, then wash off  60 g  0       Past History     Past Medical History:  Past Medical History   Diagnosis Date   . Heart murmur    . Gallstones    . Gastritis        Past Surgical History:  History reviewed. No pertinent past surgical history.    Family History:  Family History   Problem Relation Age of Onset   . Diabetes Mother    . Hypertension Mother        Social History:  History   Substance Use Topics   . Smoking status: Current Some Day Smoker -- 1.00 packs/day for .5 years   . Smokeless  tobacco: Not on file   . Alcohol Use: No      Comment: pt claims he "is not a drinker", admits to ETOH use tonight       Allergies:  Allergies   Allergen Reactions   . Gluten      Pt states "white bread"   . Percocet [Oxycodone-Acetaminophen]    . Tylenol [Acetaminophen] Hives     Tyelnol#3, hives and throw up       Review of Systems     Review of Systems   Constitutional: Negative for fever.   Eyes: Positive for blurred vision.   Respiratory: Negative for cough and shortness of breath.    Cardiovascular: Negative for chest pain.   Gastrointestinal: Positive for nausea and abdominal pain (mid abdomen). Negative for vomiting and diarrhea.   Genitourinary: Negative for dysuria.        Negative for penile discharge   Musculoskeletal: Negative for back pain.   Skin: Positive for itching (generalized) and rash (R arm and knees).   Neurological: Positive for dizziness and headaches.       Physical Exam   BP 128/80   Pulse 80   Temp(Src) 98.2 F (36.8 C) (Oral)   Resp 20    Ht 1.753 m   Wt 82.101 kg   BMI 26.72 kg/m2     SpO2 97%     Physical Exam   Constitutional: Patient is oriented to person, place, and time and well-developed, well-nourished, and in no distress.   Head: Normocephalic and atraumatic.   Eyes: EOM are normal. Pupils are equal, round, and reactive to light. pink sunconj. No scleral icterus  ENT: OP clear, MMM  Neck: Normal range of motion. Neck supple. No JVD  Cardiovascular: Normal rate and regular rhythm. No murmurs or rubs.  Pulmonary/Chest: Effort normal and breath sounds normal. No respiratory distress.   Abdominal: Soft. There is no tenderness. No rebound or guarding. No mass  Musculoskeletal: Normal range of motion. No lower extremity edema.  Neurological: Patient is alert and oriented to person, place, and time. GCS score is 15.   Skin: Skin is warm and dry. Few excoriated lesions on R upper arm. No lesions on palms or soles        Diagnostic Study Results     Labs -     Results    Procedure Component Value Units Date/Time    UA Clinitek AX (urine dipstick) - POCT [[id number]] Collected:  07/21/13 1825    Specimen Information:  Urine Updated:  07/21/13 1827     Color UA POCT Light Yellow      Clarity UA POCT Clear      Glucose, UA POCT Negative      Bilirubin, UA POCT Negative      Ketones, UA POCT Negative mg/dL      Specific Gravity, UA POCT 1.010 mg/dL      Blood, UA POCT  Negative      PH, UA POCT 6.0      Protein, UA POCT Negative mg/dL      Urobilinogen, UA POCT 0.2 mg/dL      Nitrite, UA POCT Negative      Leukocytes, UA POCT Negative     Lipase [[id number]] Collected:  07/21/13 1515    Specimen Information:  Blood Updated:  07/21/13 1541     Lipase 22 U/L     Hemolysis index [[id number]] Collected:  07/21/13 1515     Hemolysis Index 11 Updated:  07/21/13 1541  GFR [[id number]] Collected:  07/21/13 1515     EGFR >60.0 Updated:  07/21/13 1541    Comprehensive metabolic panel [[id number]]  (Abnormal) Collected:  07/21/13 1515    Specimen Information:   Blood Updated:  07/21/13 1541     Glucose 109 (H) mg/dL      BUN 78.2 mg/dL      Creatinine 1.1 mg/dL      Sodium 956 mEq/L      Potassium 4.1 mEq/L      Chloride 106 mEq/L      CO2 26 mEq/L      CALCIUM 9.7 mg/dL      Protein, Total 7.2 g/dL      Albumin 4.0 g/dL      AST (SGOT) 22 U/L      ALT 13 U/L      Alkaline Phosphatase 66 U/L      Bilirubin, Total 1.2 mg/dL      Globulin 3.2 g/dL      Albumin/Globulin Ratio 1.3      Anion Gap 8.0     CBC and differential [[id number]]  (Abnormal) Collected:  07/21/13 1515    Specimen Information:  Blood / Blood Updated:  07/21/13 1528     WBC 6.83 x10 3/uL      RBC 4.23 (L) x10 6/uL      Hgb 12.9 (L) g/dL      Hematocrit 46.9 (L) %      MCV 89.1 fL      MCH 30.5 pg      MCHC 34.2 g/dL      RDW 13 %      Platelets 166 x10 3/uL      MPV 10.2 fL      Neutrophils 58 %      Lymphocytes Automated 32 %      Monocytes 6 %      Eosinophils Automated 5 %      Basophils Automated 0 %      Immature Granulocyte 0 %      Nucleated RBC 0 /100 WBC      Neutrophils Absolute 3.93 x10 3/uL      Abs Lymph Automated 2.15 x10 3/uL      Abs Mono Automated 0.40 x10 3/uL      Abs Eos Automated 0.33 x10 3/uL      Absolute Baso Automated 0.02 x10 3/uL      Absolute Immature Granulocyte 0.01 x10 3/uL           Radiologic Studies -   Radiology Results (24 Hour)    Procedure Component Value Units Date/Time    CT Head without Contrast [[id number]] Collected:  07/21/13 1629    Order Status:  Completed  Updated:  07/21/13 1634    Narrative:        INDICATION: Headache and dizziness    COMPARISON: 2009    TECHNIQUE: Computed tomography of the head was performed at 5 mm slice  thickness from the skull base to the apex.     FINDINGS:  The ventricular system is normal in size, shape and contour.   There is no acute edema, midline shift, hemorrhage, or extra-axial fluid  collection.  The gray white matter junctions are preserved.  The visualized portions of the paranasal sinuses are demonstrates  mucosal  thickening in the ethmoid air cells with opacification of  several air cells. The remaining sinuses are clear. The visualized  osseous structures are unremarkable.      Impression:  No acute intracranial abnormality basal ganglion  calcifications, unchanged. Ethmoid sinus disease.    Kinnie Feil, MD   07/21/2013 4:30 PM        .      Medical Decision Making   I am the first provider for this patient.    I reviewed the vital signs, available nursing notes, past medical history, past surgical history, family history and social history.    Vital Signs-Reviewed the patient's vital signs.     Patient Vitals for the past 12 hrs:   BP Temp Pulse Resp   07/21/13 1425 128/80 mmHg 98.2 F (36.8 C) 80 20       Pulse Oximetry Analysis - Normal 97% on RA    Old Medical Records: Old medical records.  Nursing notes.     Seen multiple times in 2013 for CP. Hx of cocaine use. Had a stress test 09/17/11 that was negative. CAT abd/pel in 11/2011 for R sided abdominal pain that showed no acute process. Abd aorta normal in size. Seen 10/2011 for vomiting to due withdrawal from heroin. D/c back  jail. 08/2011 had an EGD for GI bleed at [facility] by Dr. Dalbert Mayotte that showed gastritis, duodenitis, and haital hernia.     ED Course:     5:41 PM - Discussed test results and tx plan with pt. States itchyiness, pain, and nausea have subsided. Wants referral to dermatologist.     6:15 PM - Pt asking to eat.     6:48 PM  Pt is feeling better and would like to go home.  Discussed test results with pt and counseled on diagnosis, f/u plans, and signs and symptoms when to return to ED.  Pt is stable and ready for discharge.     Provider Notes: 48 yo AA man with prior hx drug abuse (cocaine, heroin) with EGD 08/2011 showing gastritis and duodenitis here for mid abd pain with vague nausea over past 1 yr, as well as a few other complaints such as (1) itchy rash for "a while" tx on permethrin and anti-itch medicine for scabies (pt was  in jail formerly) and (2) nonspecific HA and dizziness with no fevers, trauma, neck stiffness. Looks well. Will check basic labs (wbc, lipase, LFT's), check abd [place] to r/o cholecystitis. Doubt AAA as pt had CT last yr with normal abd aorta size. Likely GERD. Will put on omeprazole and refer to GI if labs and imaging neg.     Addendum (07/23/13)- pls note that I also reviewed RPR + result. 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 Seen at [facility] 07/22/13 and given appropriate tx of bicillin 2.4 million units IM      Diagnosis     Clinical Impression:   1. Gastritis    2. Headache    3. Dizziness    4. Sinusitis    5. Pruritus    6. Abdominal pain        _______________________________    Attestations:  This note is prepared by Jana Hakim, acting as Scribe for Marquette Old, M.D.     Marquette Old, M.D. The scribe's documentation has been prepared under my direction and personally reviewed by me in its entirety.  I confirm that the note above accurately reflects all work, treatment, procedures, and medical decision making performed by me.        _______________________________              Marquette Old, MD  07/23/13 1423    Marquette Old, MD  07/23/13 1428

## 2013-07-21 NOTE — ED Notes (Signed)
Declined Rapid HIV Testing 07/21/2013

## 2013-07-21 NOTE — ED Notes (Signed)
Pt reports ongoing legions to back, was treated for scabies dec 2014. Pt also reports lightheadedness and stomach ache with nausea, intermittent for 1-2 weeks, worsening today.

## 2013-07-22 ENCOUNTER — Emergency Department: Payer: Self-pay

## 2013-07-22 ENCOUNTER — Emergency Department
Admission: EM | Admit: 2013-07-22 | Discharge: 2013-07-23 | Disposition: A | Discharge status: Home / Self Care | Payer: Self-pay | Attending: Emergency Medicine | Admitting: Emergency Medicine

## 2013-07-22 DIAGNOSIS — R1032 Left lower quadrant pain: Secondary | ICD-10-CM | POA: Insufficient documentation

## 2013-07-22 DIAGNOSIS — R109 Unspecified abdominal pain: Secondary | ICD-10-CM

## 2013-07-22 DIAGNOSIS — F17200 Nicotine dependence, unspecified, uncomplicated: Secondary | ICD-10-CM | POA: Insufficient documentation

## 2013-07-22 MED ORDER — SODIUM CHLORIDE 0.9 % IV BOLUS
1000.0000 mL | Freq: Once | INTRAVENOUS | Status: AC
Start: 2013-07-22 — End: 2013-07-23
  Administered 2013-07-23: 1000 mL via INTRAVENOUS

## 2013-07-22 MED ORDER — ONDANSETRON HCL 4 MG/2ML IJ SOLN
4.0000 mg | Freq: Once | INTRAMUSCULAR | Status: AC
Start: 2013-07-22 — End: 2013-07-23
  Administered 2013-07-23: 4 mg via INTRAVENOUS
  Filled 2013-07-22: qty 2

## 2013-07-22 MED ORDER — IODIXANOL 320 MG/ML IV SOLN
INTRAVENOUS | Status: DC
Start: 2013-07-22 — End: 2013-07-23
  Filled 2013-07-22: qty 100

## 2013-07-22 MED ORDER — MORPHINE SULFATE 10 MG/ML IJ/IV SOLN (WRAP)
8.0000 mg | Freq: Once | Status: AC
Start: 2013-07-22 — End: 2013-07-23
  Administered 2013-07-23: 8 mg via INTRAVENOUS
  Filled 2013-07-22: qty 1

## 2013-07-22 NOTE — ED Notes (Signed)
Pt c/o chest tightness and abdominal pain, N/V, diarrhea that began at 1200 on 07/22/13.Chest tightness is in middle of chest. Does not radiate.

## 2013-07-22 NOTE — ED Provider Notes (Signed)
Physician/Midlevel provider first contact with patient: 07/22/13 2308         History     Chief Complaint   Patient presents with   . Abdominal Pain     Patient is a 48 y.o. male presenting with abdominal pain. The history is provided by the patient and medical records. No language interpreter was used.   Abdominal Pain  Pain location:  LLQ  Pain quality: sharp    Pain radiates to:  Does not radiate  Pain severity:  Severe  Onset quality:  Sudden  Duration:  3 hours  Timing:  Constant  Progression:  Worsening  Chronicity:  New  Context: not alcohol use    Relieved by:  Nothing  Worsened by:  Nothing tried  Ineffective treatments:  None tried  Associated symptoms: diarrhea and vomiting    Associated symptoms: no chest pain, no chills and no cough    pt seen in ED yesterday for different abdominal pain.  RPR done positive.  No pmd at this time.    Past Medical History   Diagnosis Date   . Heart murmur    . Gallstones    . Gastritis    06/30/2013 HYDROCODONE BITARTRATE-ACETAMINOPHE, 325 MG-7.5 MG, TAB 120.00 30 6819 POW EM79 06/29/2013 [id number] N [id number] 01  05/13/2013 HYDROCODONE BITARTRATE-ACETAMINOPHE, 325 MG-7.5 MG, TAB 120.00 30 6819 CHE AB30 05/12/2013 [id number] N [id number] 01  04/03/2013 HYDROCODONE BITARTRATE-ACETAMINOPHE, 325 MG-7.5 MG, TAB 120.00 30 6819 CHE AB30 03/30/2013 [id number] N [id number] 01  01/06/2013 HYDROCODONE BITARTRATE-ACETAMINOPHE, 325 MG-7.5 MG, TAB 120.00 30 267 CHE AB91 01/05/2013 [id number] N [id number] 04  10/07/2012 HYDROCODONE BITARTRATE-ACETAMINOPHE, 325 MG-7.5 MG, TAB 120.00 30 6819 [facility] RA00 09/18/2012 [id number] N [id number] 04    History reviewed. No pertinent past surgical history.    Family History   Problem Relation Age of Onset   . Diabetes Mother    . Hypertension Mother        Social  History   Substance Use Topics   . Smoking status: Current Some Day Smoker -- 1.00 packs/day for .5 years   . Smokeless tobacco: Not on file   . Alcohol Use: No      Comment: pt claims he "is not a drinker",  admits to ETOH use tonight       .     Allergies   Allergen Reactions   . Gluten      Pt states "white bread"   . Percocet [Oxycodone-Acetaminophen]    . Tylenol [Acetaminophen] Hives     Tyelnol#3, hives and throw up       Current/Home Medications    AMOXICILLIN-CLAVULANATE (AUGMENTIN) 875-125 MG PER TABLET    Take 1 tablet by mouth 2 (two) times daily.    ELASTIC BANDAGES & SUPPORTS (MEDICAL COMPRESSION STOCKINGS) MISC    2 Unit by Does not apply route as needed.    FERROUS SULFATE (IRON) 325 (65 FE) MG TABS    Take 1 tablet by mouth daily.    OMEPRAZOLE (PRILOSEC OTC) 20 MG TABLET    Take 1 tablet (20 mg total) by mouth daily.        Review of Systems   Constitutional: Negative for chills.   HENT: Negative for rhinorrhea.    Respiratory: Negative for cough.    Cardiovascular: Negative for chest pain.   Gastrointestinal: Positive for vomiting, abdominal pain and diarrhea. Negative for blood in stool.   All other systems reviewed and are negative.      Physical  Exam    BP: 143/81 mmHg, Heart Rate: 97, Temp: 98.8 F (37.1 C), Resp Rate: 22, SpO2: 99 %, Weight: 78.954 kg    Physical Exam  Nursing note and vitals reviewed.  Constitutional:  Well developed, well nourished. Awake & Oriented x3.  Head:  Atraumatic. Normocephalic.    Eyes:  PERRL. EOMI. Conjunctivae are not pale.  ENT:  Mucous membranes are moist and intact. Oropharynx is clear and symmetric.  Patent airway.  Neck:  Supple. Full ROM.    Cardiovascular:  Regular rate. Regular rhythm. No murmurs, rubs, or gallops.  Pulmonary/Chest:  No evidence of respiratory distress. Clear to auscultation bilaterally.  No wheezing, rales or rhonchi.   Abdominal:  Soft and non-distended. There is mild  Tenderness llq. No rebound, guarding, or rigidity.  Back:  Full ROM. Nontender.  Extremities:  No edema. No cyanosis. No clubbing. Full range of motion in all extremities.  Skin:  Skin is warm and dry.  No diaphoresis. No rash.   Neurological:  Alert, awake, and  appropriate. Normal speech. Motor normal.  Psychiatric:  Good eye contact. Normal interaction, affect, and behavior.    Pox 99 ra normal no tx needed.    MDM and ED Course     ED Medication Orders    Start     Status Ordering Provider    07/23/13 0102  morphine injection 8 mg   Once     Route: Intravenous  Ordered Dose: 8 mg     Ordered Kodiak Rollyson C    07/23/13 0019  penicillin G benzathine (BICILLIN-LA) injection 2.4 Million Units   Once     Route: Intramuscular  Ordered Dose: 2.4 Million Units     Last MAR action:  Given Alvey Brockel C    07/22/13 2347  sodium chloride 0.9 % bolus 1,000 mL   Once     Route: Intravenous  Ordered Dose: 1,000 mL     Last MAR action:  New Bag Oskar Cretella C    07/22/13 2347  morphine injection 8 mg   Once     Route: Intravenous  Ordered Dose: 8 mg     Last MAR action:  Given Aran Menning C    07/22/13 2347  ondansetron (ZOFRAN) injection 4 mg   Once     Route: Intravenous  Ordered Dose: 4 mg     Last MAR action:  Given Semisi Biela C         Results for orders placed during the hospital encounter of 07/22/13   CT ABDOMEN PELVIS W IV/ WO PO CONT    Narrative:     EXAM:    CT Abdomen and Pelvis With Intravenous Contrast.    CLINICAL HISTORY:    48 years old, male; Pain lower abdominal pain    TECHNIQUE:    Axial computed tomography images of the abdomen and pelvis with intravenous   contrast.    Coronal and sagittal reformatted images were created and reviewed.    EXAM DATE/TIME:    Exam ordered 07/23/2013 12:09 AM    COMPARISON:    No relevant prior studies available.    FINDINGS:    Limitations:  Artifact from the patient's arms along his sides.Limitations:   Motion artifact compromises the examination.      Lower thorax: No acute findings.     ABDOMEN:    Liver:  Unremarkable as visualized.    Gallbladder and bile ducts:  No gallbladder distention.  No calcified stones.  No CBD dilation.    Pancreas:  Unremarkable as visualized.    Spleen:  Unremarkable as  visualized.    Adrenals:  Unremarkable as visualized.    Kidneys and ureters:  Left nephrolithiasis with dilatation of a left renal   calyx.  Symmetric nephrograms.     PELVIS:    Bladder:  Unremarkable as visualized.    Reproductive:  Unremarkable as visualized.    Appendix:  No findings to suggest acute appendicitis.     ABDOMEN & PELVIS:    Stomach and bowel:  Luminal collapse and absence of oral contrast in the   colon exaggerates the degree of  submucosal thickening.  No bowel obstruction.    Peritoneum:  No ascites.  No pneumoperitoneum.    Lymph nodes:  No enlarged lymph nodes.    Vasculature:  No abdominal aortic aneurysm.    Bones:  Transitional lumbosacral junction.  No lumbar vertebral compression   deformity.           Impression:     IMPRESSION:         - Left nephrolithiasis with dilatation of a left renal calyx.  No acute   pyelonephritis         Results for orders placed during the hospital encounter of 07/22/13   CBC AND DIFFERENTIAL       Result Value Ref Range    WBC 9.52  3.50 - 10.80 x10 3/uL    RBC 4.43 (*) 4.70 - 6.00 x10 6/uL    Hgb 13.7  13.0 - 17.0 g/dL    Hematocrit 16.1 (*) 42.0 - 52.0 %    MCV 88.9  80.0 - 100.0 fL    MCH 30.9  28.0 - 32.0 pg    MCHC 34.8  32.0 - 36.0 g/dL    RDW 13  12 - 15 %    Platelets 160  140 - 400 x10 3/uL    MPV 10.8  9.4 - 12.3 fL    Neutrophils 60  None %    Lymphocytes Automated 27  None %    Monocytes 10  None %    Eosinophils Automated 2  None %    Basophils Automated 0  None %    Immature Granulocyte 0  None %    Nucleated RBC 0  0 - 1 /100 WBC    Neutrophils Absolute 5.75  1.80 - 8.10 x10 3/uL    Abs Lymph Automated 2.60  0.50 - 4.40 x10 3/uL    Abs Mono Automated 0.92  0.00 - 1.20 x10 3/uL    Abs Eos Automated 0.23  0.00 - 0.70 x10 3/uL    Absolute Baso Automated 0.02  0.00 - 0.20 x10 3/uL    Absolute Immature Granulocyte 0.02  0 x10 3/uL   COMPREHENSIVE METABOLIC PANEL       Result Value Ref Range    Glucose 93  70 - 100 mg/dL    BUN 11  9 - 28 mg/dL     Creatinine 0.9  0.7 - 1.3 mg/dL    Sodium 096  045 - 409 mEq/L    Potassium 3.9  3.5 - 5.1 mEq/L    Chloride 106  100 - 111 mEq/L    CO2 21 (*) 22 - 29 mEq/L    CALCIUM 9.8  8.5 - 10.5 mg/dL    Protein, Total 7.6  6.0 - 8.3 g/dL    Albumin 4.4  3.5 - 5.0 g/dL    AST (SGOT)  24  5 - 34 U/L    ALT 19  0 - 55 U/L    Alkaline Phosphatase 76  38 - 106 U/L    Bilirubin, Total 2.3 (*) 0.2 - 1.2 mg/dL    Globulin 3.2  2.0 - 3.6 g/dL    Albumin/Globulin Ratio 1.4  0.9 - 2.2   LIPASE       Result Value Ref Range    Lipase 16  8 - 78 U/L   ETHANOL       Result Value Ref Range    Alcohol None Detected  None Detected mg/dL   RAPID DRUG SCREEN, URINE       Result Value Ref Range    Amphetamine Screen, UR Negative  Negative    Barbiturate Screen, UR Negative  Negative    Benzodiazepine Screen, UR Negative  Negative    Cannabinoid Screen, UR Negative  Negative    Cocaine, UR Negative  Negative    Opiate Screen, UR Positive (*) Negative    PCP Screen, UR Negative  Negative   GFR       Result Value Ref Range    EGFR >60.0       MDM  ekg read by [facility] [address].  Lad.  Non specific t wave changes.  Motion artifact.    Pt feeling better.  Denies rx found on [place] drug monitoring program.  I told him it could be an error and he should call tomorrow.  He wanted to be admitted but I told him I could not find a reason to do so.  Will [place] to fu local MD's.  Procedures    Clinical Impression & Disposition     Clinical Impression  Final diagnoses:   Abdominal pain        ED Disposition    Discharge Izea Livolsi Gosse discharge to home/self care.    Condition at disposition: Stable             New Prescriptions    ACETAMINOPHEN (TYLENOL) 325 MG TABLET    Take 2 tablets (650 mg total) by mouth every 4 (four) hours as needed for Pain.    LOPERAMIDE (IMODIUM) 2 MG CAPSULE    Take 1 capsule (2 mg total) by mouth 4 (four) times daily as needed for Diarrhea.    ONDANSETRON (ZOFRAN ODT) 4 MG DISINTEGRATING TABLET    Take 1 tablet (4 mg total) by mouth every 6 (six)  hours as needed for Nausea.                 Kennith Maes, MD  07/23/13 [phone]

## 2013-07-22 NOTE — ED Notes (Signed)
Called pt to place in room for room assignment and ECG. Pt in bathroom.

## 2013-07-23 LAB — CBC AND DIFFERENTIAL
Basophils Absolute Automated: 0.02 10*3/uL (ref 0.00–0.20)
Basophils Automated: 0 %
Eosinophils Absolute Automated: 0.23 10*3/uL (ref 0.00–0.70)
Eosinophils Automated: 2 %
Hematocrit: 39.4 % — ABNORMAL LOW (ref 42.0–52.0)
Hgb: 13.7 g/dL (ref 13.0–17.0)
Immature Granulocytes Absolute: 0.02 10*3/uL
Immature Granulocytes: 0 %
Lymphocytes Absolute Automated: 2.6 10*3/uL (ref 0.50–4.40)
Lymphocytes Automated: 27 %
MCH: 30.9 pg (ref 28.0–32.0)
MCHC: 34.8 g/dL (ref 32.0–36.0)
MCV: 88.9 fL (ref 80.0–100.0)
MPV: 10.8 fL (ref 9.4–12.3)
Monocytes Absolute Automated: 0.92 10*3/uL (ref 0.00–1.20)
Monocytes: 10 %
Neutrophils Absolute: 5.75 10*3/uL (ref 1.80–8.10)
Neutrophils: 60 %
Nucleated RBC: 0 /100 WBC (ref 0–1)
Platelets: 160 10*3/uL (ref 140–400)
RBC: 4.43 10*6/uL — ABNORMAL LOW (ref 4.70–6.00)
RDW: 13 % (ref 12–15)
WBC: 9.52 10*3/uL (ref 3.50–10.80)

## 2013-07-23 LAB — COMPREHENSIVE METABOLIC PANEL
ALT: 19 U/L (ref 0–55)
AST (SGOT): 24 U/L (ref 5–34)
Albumin/Globulin Ratio: 1.4 (ref 0.9–2.2)
Albumin: 4.4 g/dL (ref 3.5–5.0)
Alkaline Phosphatase: 76 U/L (ref 38–106)
BUN: 11 mg/dL (ref 9–28)
Bilirubin, Total: 2.3 mg/dL — ABNORMAL HIGH (ref 0.2–1.2)
CO2: 21 mEq/L — ABNORMAL LOW (ref 22–29)
Calcium: 9.8 mg/dL (ref 8.5–10.5)
Chloride: 106 mEq/L (ref 100–111)
Creatinine: 0.9 mg/dL (ref 0.7–1.3)
Globulin: 3.2 g/dL (ref 2.0–3.6)
Glucose: 93 mg/dL (ref 70–100)
Potassium: 3.9 mEq/L (ref 3.5–5.1)
Protein, Total: 7.6 g/dL (ref 6.0–8.3)
Sodium: 139 mEq/L (ref 136–145)

## 2013-07-23 LAB — LIPASE: Lipase: 16 U/L (ref 8–78)

## 2013-07-23 LAB — ECG 12-LEAD
Atrial Rate: 107 {beats}/min
P Axis: 58 degrees
P-R Interval: 172 ms
Q-T Interval: 344 ms
QRS Duration: 82 ms
QTC Calculation (Bezet): 459 ms
R Axis: -45 degrees
T Axis: 23 degrees
Ventricular Rate: 107 {beats}/min

## 2013-07-23 LAB — RAPID DRUG SCREEN, URINE
Barbiturate Screen, UR: NEGATIVE
Benzodiazepine Screen, UR: NEGATIVE
Cannabinoid Screen, UR: NEGATIVE
Cocaine, UR: NEGATIVE
Opiate Screen, UR: POSITIVE — AB
PCP Screen, UR: NEGATIVE
Urine Amphetamine Screen: NEGATIVE

## 2013-07-23 LAB — GFR: EGFR: >60

## 2013-07-23 LAB — ETHANOL: Alcohol: NOT DETECTED mg/dL

## 2013-07-23 MED ORDER — ONDANSETRON 4 MG PO TBDP
4.0000 mg | ORAL_TABLET | Freq: Four times a day (QID) | ORAL | Status: AC | PRN
Start: 2013-07-23 — End: 2013-07-30

## 2013-07-23 MED ORDER — IODIXANOL 320 MG/ML IV SOLN
100.0000 mL | Freq: Once | INTRAVENOUS | Status: AC | PRN
Start: 2013-07-23 — End: 2013-07-23
  Administered 2013-07-23: 100 mL via INTRAVENOUS

## 2013-07-23 MED ORDER — MORPHINE SULFATE 10 MG/ML IJ/IV SOLN (WRAP)
8.0000 mg | Freq: Once | Status: AC
Start: 2013-07-23 — End: 2013-07-23
  Administered 2013-07-23: 8 mg via INTRAVENOUS
  Filled 2013-07-23: qty 1

## 2013-07-23 MED ORDER — ACETAMINOPHEN 325 MG PO TABS
650.0000 mg | ORAL_TABLET | ORAL | Status: AC | PRN
Start: 2013-07-23 — End: 2013-08-02

## 2013-07-23 MED ORDER — PENICILLIN G BENZATHINE 1200000 UNIT/2ML IM SUSP
2.4000 10*6.[IU] | Freq: Once | INTRAMUSCULAR | Status: AC
Start: 2013-07-23 — End: 2013-07-23
  Administered 2013-07-23: 2.4 10*6.[IU] via INTRAMUSCULAR
  Filled 2013-07-23: qty 4

## 2013-07-23 MED ORDER — LOPERAMIDE HCL 2 MG PO CAPS
2.0000 mg | ORAL_CAPSULE | Freq: Four times a day (QID) | ORAL | Status: AC | PRN
Start: 2013-07-23 — End: 2013-08-02

## 2013-07-23 NOTE — Discharge Instructions (Signed)
Abdominal Pain     You have been diagnosed with abdominal (belly) pain. The cause of your pain is not yet known.     Many things can cause abdominal pain. Examples include viral infections and bowel (intestine) spasms. You might need another examination or more tests to find out why you have pain.     At this time, your pain does not seem to be caused by anything dangerous. You do not need surgery. You do not need to stay in the hospital.      Though we don’t believe your condition is dangerous right now, it is important to be careful. Sometimes a problem that seems mild can become serious later. This is why it is very important that you return here or go to the nearest Emergency Department unless you are 100% improved.     Return here or go to the nearest Emergency Department, or follow up with your physician in:  · 24 hours.     Drink only clear liquids such as water, clear broth, sports drinks, or clear caffeine-free soft drinks, like 7-Up or Sprite, for the next:  · 24 hours.     YOU SHOULD SEEK MEDICAL ATTENTION IMMEDIATELY, EITHER HERE OR AT THE NEAREST EMERGENCY DEPARTMENT, IF ANY OF THE FOLLOWING OCCURS:  · Your pain does not go away or gets worse.  · You cannot keep fluids down or your vomit is dark green.   · You vomit blood or see blood in your stool. Blood might be bright red or dark red. It can also be black and look like tar.  · You have a fever (temperature higher than 100.4ºF / 38ºC) or shaking chills.  · Your skin or eyes look yellow or your urine looks brown.  · You have severe diarrhea.

## 2013-07-23 NOTE — Progress Notes (Signed)
Quick Note:    RPR results have been reviewed by Dr. Rush Farmer. No change in treatment.  ______

## 2013-07-24 ENCOUNTER — Emergency Department
Admission: EM | Admit: 2013-07-24 | Discharge: 2013-07-24 | Disposition: A | Discharge status: Home / Self Care | Payer: Self-pay | Attending: Emergency Medicine | Admitting: Emergency Medicine

## 2013-07-24 ENCOUNTER — Emergency Department: Payer: Self-pay

## 2013-07-24 DIAGNOSIS — K2970 Gastritis, unspecified, without bleeding: Secondary | ICD-10-CM

## 2013-07-24 DIAGNOSIS — R109 Unspecified abdominal pain: Secondary | ICD-10-CM | POA: Insufficient documentation

## 2013-07-24 DIAGNOSIS — F17200 Nicotine dependence, unspecified, uncomplicated: Secondary | ICD-10-CM | POA: Insufficient documentation

## 2013-07-24 DIAGNOSIS — K2990 Gastroduodenitis, unspecified, without bleeding: Secondary | ICD-10-CM | POA: Insufficient documentation

## 2013-07-24 LAB — CBC AND DIFFERENTIAL
Basophils Absolute Automated: 0.03 10*3/uL (ref 0.00–0.20)
Basophils Automated: 0 %
Eosinophils Absolute Automated: 0.39 10*3/uL (ref 0.00–0.70)
Eosinophils Automated: 4 %
Hematocrit: 36.7 % — ABNORMAL LOW (ref 42.0–52.0)
Hgb: 12.9 g/dL — ABNORMAL LOW (ref 13.0–17.0)
Immature Granulocytes Absolute: 0.02 10*3/uL
Immature Granulocytes: 0 %
Lymphocytes Absolute Automated: 2.45 10*3/uL (ref 0.50–4.40)
Lymphocytes Automated: 25 %
MCH: 30.9 pg (ref 28.0–32.0)
MCHC: 35.1 g/dL (ref 32.0–36.0)
MCV: 88 fL (ref 80.0–100.0)
MPV: 10.3 fL (ref 9.4–12.3)
Monocytes Absolute Automated: 1 10*3/uL (ref 0.00–1.20)
Monocytes: 10 %
Neutrophils Absolute: 5.86 10*3/uL (ref 1.80–8.10)
Neutrophils: 60 %
Nucleated RBC: 0 /100 WBC (ref 0–1)
Platelets: 169 10*3/uL (ref 140–400)
RBC: 4.17 10*6/uL — ABNORMAL LOW (ref 4.70–6.00)
RDW: 13 % (ref 12–15)
WBC: 9.73 10*3/uL (ref 3.50–10.80)

## 2013-07-24 LAB — POCT URINALYSIS AUTOMATED (IAH)
Bilirubin, UA POCT: NEGATIVE
Bilirubin, UA POCT: NEGATIVE
Blood, UA POCT: NEGATIVE
Blood, UA POCT: NEGATIVE
Glucose, UA POCT: NEGATIVE
Glucose, UA POCT: NEGATIVE
Nitrite, UA POCT: NEGATIVE
Nitrite, UA POCT: NEGATIVE
PH, UA POCT: 5.5 (ref 4.6–8)
PH, UA POCT: 6 (ref 4.6–8)
Protein, UA POCT: NEGATIVE mg/dL
Protein, UA POCT: NEGATIVE mg/dL
Specific Gravity, UA POCT: 1.01 mg/dL (ref 1.001–1.035)
Specific Gravity, UA POCT: 1.015 mg/dL (ref 1.001–1.035)
Urine Leukocytes POCT: NEGATIVE
Urine Leukocytes POCT: NEGATIVE
Urobilinogen, UA POCT: 0.2 mg/dL
Urobilinogen, UA POCT: 0.2 mg/dL

## 2013-07-24 LAB — COMPREHENSIVE METABOLIC PANEL
ALT: 17 U/L (ref 0–55)
AST (SGOT): 32 U/L (ref 5–34)
Albumin/Globulin Ratio: 1.3 (ref 0.9–2.2)
Albumin: 4.3 g/dL (ref 3.5–5.0)
Alkaline Phosphatase: 75 U/L (ref 38–106)
Anion Gap: 11 (ref 5.0–15.0)
BUN: 13 mg/dL (ref 9.0–28.0)
Bilirubin, Total: 2.3 mg/dL — ABNORMAL HIGH (ref 0.2–1.2)
CO2: 23 mEq/L (ref 22–29)
Calcium: 9.8 mg/dL (ref 8.5–10.5)
Chloride: 102 mEq/L (ref 100–111)
Creatinine: 0.9 mg/dL (ref 0.7–1.3)
Globulin: 3.3 g/dL (ref 2.0–3.6)
Glucose: 91 mg/dL (ref 70–100)
Potassium: 4 mEq/L (ref 3.5–5.1)
Protein, Total: 7.6 g/dL (ref 6.0–8.3)
Sodium: 136 mEq/L (ref 136–145)

## 2013-07-24 LAB — LIPASE: Lipase: 22 U/L (ref 8–78)

## 2013-07-24 LAB — GFR: EGFR: >60

## 2013-07-24 LAB — HEMOLYSIS INDEX: Hemolysis Index: 2 (ref 0–18)

## 2013-07-24 LAB — BILIRUBIN, DIRECT: Bilirubin Direct: 0.7 mg/dL — ABNORMAL HIGH (ref 0.0–0.5)

## 2013-07-24 MED ORDER — ALUM & MAG HYDROXIDE-SIMETH 200-200-20 MG/5ML PO SUSP
10.0000 mL | Freq: Four times a day (QID) | ORAL | Status: AC | PRN
Start: 2013-07-24 — End: 2013-08-03

## 2013-07-24 MED ORDER — FAMOTIDINE 20 MG PO TABS
20.0000 mg | ORAL_TABLET | Freq: Two times a day (BID) | ORAL | Status: AC
Start: 2013-07-24 — End: 2013-07-29

## 2013-07-24 MED ORDER — LIDOCAINE VISCOUS 2 % MT SOLN
10.0000 mL | Freq: Once | OROMUCOSAL | Status: AC
Start: 2013-07-24 — End: 2013-07-24
  Administered 2013-07-24: 10 mL via OROMUCOSAL
  Filled 2013-07-24: qty 15

## 2013-07-24 MED ORDER — ALUM & MAG HYDROXIDE-SIMETH 200-200-20 MG/5ML PO SUSP
30.0000 mL | Freq: Once | ORAL | Status: AC
Start: 2013-07-24 — End: 2013-07-24
  Administered 2013-07-24: 30 mL via ORAL
  Filled 2013-07-24: qty 30

## 2013-07-24 NOTE — Discharge Instructions (Signed)
Abdominal Pain    You have been diagnosed with abdominal (belly) pain. The cause of your pain is not yet known.    Many things can cause abdominal pain. Examples include viral infections and bowel (intestine) spasms. You might need another examination or more tests to find out why you have pain.    At this time, your pain does not seem to be caused by anything dangerous. You do not need surgery. You do not need to stay in the hospital.     Though we don't believe your condition is dangerous right now, it is important to be careful. Sometimes a problem that seems mild can become serious later. This is why it is very important that you return here or go to the nearest Emergency Department unless you are 100% improved.    Return here or go to the nearest Emergency Department, or follow up with your physician in:   24 hours.    Drink only clear liquids such as water, clear broth, sports drinks, or clear caffeine-free soft drinks, like 7-Up or Sprite, for the next:   24 hours.    YOU SHOULD SEEK MEDICAL ATTENTION IMMEDIATELY, EITHER HERE OR AT THE NEAREST EMERGENCY DEPARTMENT, IF ANY OF THE FOLLOWING OCCURS:   Your pain does not go away or gets worse.   You cannot keep fluids down or your vomit is dark green.    You vomit blood or see blood in your stool. Blood might be bright red or dark red. It can also be black and look like tar.   You have a fever (temperature higher than 100.4F / 38C) or shaking chills.   Your skin or eyes look yellow or your urine looks brown.   You have severe diarrhea.            Gastritis    You have been diagnosed with gastritis.    Gastritis is an irritation of the stomach lining. It has a number of causes including the use of aspirin and other anti-inflammatory medicines like ibuprofen (Advil or Motrin), naproxen (Naprosyn or Aleve), etc. Other causes are infections and too much stomach acid. Drinking alcohol can also cause gastritis.    You may be prescribed medicines  to lower the amount of acid in the stomach or otherwise protect the stomach lining.    Use an antacid (Maalox and Mylanta) as directed on the bottle (1-2 tablespoons or 5-10 ml four times a day).    Avoid caffeine and alcohol. Avoid aspirin and other anti-inflammatory medicines like ibuprofen (Advil or Motrin), naproxen (Naprosyn or Aleve), etc. Acetaminophen (Tylenol) is safe for your stomach.    Spicy foods and acidic foods may increase pain. However, they do not damage the stomach lining. Avoid these foods if they cause pain.    You may be referred to a stomach specialist (Gastroenterologist) for further evaluation.    YOU SHOULD SEEK MEDICAL ATTENTION IMMEDIATELY, EITHER HERE OR AT THE NEAREST EMERGENCY DEPARTMENT, IF ANY OF THE FOLLOWING OCCURS:   Your pain suddenly gets worse.   You vomit (throw up) repeatedly or vomit blood or material that looks like "coffee grounds."   Any blood is in your stool or your stool gets very dark or looks like tar.   You develop a fever (temperature higher than 100.4F / 38C) or shaking chills.

## 2013-07-24 NOTE — ED Notes (Signed)
[name] [name] is a 48 y.o. male c/o abdominal pain and diarrhea x 1 week. Pt was seen yesterday for the same complaint. Hx of gastritis.

## 2013-07-24 NOTE — ED Provider Notes (Signed)
EMERGENCY DEPARTMENT HISTORY AND PHYSICAL EXAM    Date: 07/24/2013   Physician/Midlevel provider first contact with patient: 07/24/13 0208       Patient Name: [name] [name]  Attending Physician: Judi Cong, MD  Mid-level: Tula Nakayama, PA-C      History of Presenting Illness     Chief Complaint   Patient presents with   . Abdominal Pain       History Provided By: patient    Chief Complaint: abd pain     [name] [name] is a 48 y.o. male complaining of Lt side abd pain for the past 2 days.  Pt states tonight the pain got worse.  Pt has been taking Prilosec for GERD.  Pt has been seen in the ED 2 times in the past 4 days; had negative ABD CT 2 days ago for similar pain.  Pt states he had some diarrhea PTA.  Pt denies any fevers, chills, chest pain, SOB, back pain, or dysuria.    PCP: Pcp, Noneorunknown, MD    No current facility-administered medications for this encounter.     Current Outpatient Prescriptions   Medication Sig Dispense Refill   . acetaminophen (TYLENOL) 325 MG tablet Take 2 tablets (650 mg total) by mouth every 4 (four) hours as needed for Pain.  60 tablet  0   . alum & mag hydroxide-simethicone (MAALOX PLUS) 200-200-20 mg/5 mL suspension Take 10 mLs by mouth every 6 (six) hours as needed for Indigestion.  355 mL  0   . amoxicillin-clavulanate (AUGMENTIN) 875-125 MG per tablet Take 1 tablet by mouth 2 (two) times daily.  14 tablet  0   . famotidine (PEPCID) 20 MG tablet Take 1 tablet (20 mg total) by mouth 2 (two) times daily.  60 tablet  1   . Ferrous Sulfate (IRON) 325 (65 FE) MG TABS Take 1 tablet by mouth daily.  30 each  0   . loperamide (IMODIUM) 2 MG capsule Take 1 capsule (2 mg total) by mouth 4 (four) times daily as needed for Diarrhea.  30 capsule  0   . omeprazole (PRILOSEC OTC) 20 MG tablet Take 1 tablet (20 mg total) by mouth daily.  30 tablet  0   . ondansetron (ZOFRAN ODT) 4 MG disintegrating tablet Take 1 tablet (4 mg total) by mouth every 6 (six) hours as needed for Nausea.  15  tablet  0       Past Medical History     Past Medical History   Diagnosis Date   . Heart murmur    . Gallstones    . Gastritis      History reviewed. No pertinent past surgical history.    Family History     Family History   Problem Relation Age of Onset   . Diabetes Mother    . Hypertension Mother        Social History     History     Social History   . Marital Status: Single     Spouse Name: N/A     Number of Children: N/A   . Years of Education: N/A     Social History Main Topics   . Smoking status: Current Some Day Smoker -- 1.00 packs/day for .5 years   . Smokeless tobacco: Not on file   . Alcohol Use: No      Comment: pt claims he "is not a drinker", admits to ETOH use tonight   . Drug Use: Yes  Special: Cocaine      Comment: cocaine use in the past   . Sexual Activity: Not on file     Other Topics Concern   . Not on file     Social History Narrative         Allergies     Allergies   Allergen Reactions   . Gluten      Pt states "white bread"   . Percocet [Oxycodone-Acetaminophen]    . Tylenol [Acetaminophen] Hives     Tyelnol#3, hives and throw up       Review of Systems     Review of Systems   Constitutional: Negative for fever and chills.   Respiratory: Negative for cough and shortness of breath.    Cardiovascular: Negative for chest pain and leg swelling.   Gastrointestinal: Positive for abdominal pain and diarrhea. Negative for nausea, vomiting, constipation, blood in stool and melena.   Genitourinary: Negative for dysuria, urgency, frequency, hematuria and flank pain.   Musculoskeletal: Negative for myalgias, back pain and joint pain.   Skin: Positive for rash.   Neurological: Negative for dizziness, tingling and headaches.   All other systems reviewed and are negative.        Physical Exam   BP 121/79   Pulse 77   Temp(Src) 97.4 F (36.3 C) (Oral)   Resp 16   SpO2 99%     CONSTITUTIONAL:  Oriented to person, place, and time.  Well-developed and well-nourished.  No acute distress. Non-toxic  appearance.  Vital signs reviewed.  RESP:  No respiratory distress.  Breath sounds with no rales, wheezing, or rhonchi. No chest wall tenderness.  CARDIAC:  Regular rate and rhythm.  Heart sounds with no murmurs, gallop, or friction rub.  ABD:  Normal bowel sounds x4 quadrants.  Soft.  Mild to moderate epigastric and LUQ tenderness.  Nondistended.  No organomegaly.  No peritoneal signs.  No signs of trauma or contusions.  No palpable pulsatile masses.  BACK:  No CVA tenderness.  Full range of motion without pain.  No spinous process tenderness, muscular tenderness, or spasm present.  MS:  No lacerations, contusions, or deformity.  Full active and passive range of motion. 2+ pulses.  No edema or calf tenderness.   SKIN:  Skin is warm and dry.  No rashes, lesions, contusions, or drainage.  NEURO:  A0X3.  Sensory intact throughout  PSYCH:  Mood, memory, affect, and judgment normal for age.      Diagnostic Study Results     Labs -     Results    Procedure Component Value Units Date/Time    UA POC (POCT UA Clinitek AX) [[id number]]  (Abnormal) Collected:  07/24/13 0407     Color UA POCT Yellow Updated:  07/24/13 0407     Clarity UA POCT Clear      Glucose, UA POCT Negative      Bilirubin, UA POCT Negative      Ketones, UA POCT Trace (A) mg/dL      Specific Gravity, UA POCT 1.010 mg/dL      Blood, UA POCT  Negative      PH, UA POCT 6.0      Protein, UA POCT Negative mg/dL      Urobilinogen, UA POCT 0.2 mg/dL      Nitrite, UA POCT Negative      Leukocytes, UA POCT Negative     Bilirubin, direct [[id number]]  (Abnormal) Collected:  07/24/13 0227     Bilirubin, Direct  0.7 (H) mg/dL Updated:  91/47/82 9562    Comprehensive Metabolic Panel (CMP) [[id number]]  (Abnormal) Collected:  07/24/13 0227    Specimen Information:  Blood Updated:  07/24/13 0301     Glucose 91 mg/dL      BUN 69.6 mg/dL      Creatinine 0.9 mg/dL      Sodium 295 mEq/L      Potassium 4.0 mEq/L      Chloride 102 mEq/L      CO2 23 mEq/L      CALCIUM 9.8 mg/dL       Protein, Total 7.6 g/dL      Albumin 4.3 g/dL      AST (SGOT) 32 U/L      ALT 17 U/L      Alkaline Phosphatase 75 U/L      Bilirubin, Total 2.3 (H) mg/dL      Globulin 3.3 g/dL      Albumin/Globulin Ratio 1.3      Anion Gap 11.0     Lipase [[id number]] Collected:  07/24/13 0227    Specimen Information:  Blood Updated:  07/24/13 0301     Lipase 22 U/L     Hemolysis index [[id number]] Collected:  07/24/13 0227     Hemolysis Index 2 Updated:  07/24/13 0301    GFR [[id number]] Collected:  07/24/13 0227     EGFR >60.0 Updated:  07/24/13 0301    CBC and differential [[id number]]  (Abnormal) Collected:  07/24/13 0227    Specimen Information:  Blood / Blood Updated:  07/24/13 0249     WBC 9.73 x10 3/uL      RBC 4.17 (L) x10 6/uL      Hgb 12.9 (L) g/dL      Hematocrit 33.2 (L) %      MCV 88.0 fL      MCH 30.9 pg      MCHC 35.1 g/dL      RDW 13 %      Platelets 169 x10 3/uL      MPV 10.3 fL      Neutrophils 60 %      Lymphocytes Automated 25 %      Monocytes 10 %      Eosinophils Automated 4 %      Basophils Automated 0 %      Immature Granulocyte 0 %      Nucleated RBC 0 /100 WBC      Neutrophils Absolute 5.86 x10 3/uL      Abs Lymph Automated 2.45 x10 3/uL      Abs Mono Automated 1.00 x10 3/uL      Abs Eos Automated 0.39 x10 3/uL      Absolute Baso Automated 0.03 x10 3/uL      Absolute Immature Granulocyte 0.02 x10 3/uL           Radiologic Studies -   Radiology Results (24 Hour)    ** No results found for the last 24 hours. **          Clinical Course in the Emergency Department     2:21 AM  Will check Labs, and UA, since previous negative acute abd CT 2 days ago and abd [place] 3 days ago, with similar pain.  Maalox 30cc PO, and Viscous lidocaine 10cc PO ordered for probable gastritis.    3:03 AM  Reviewed case with Dr Peggye Form.  Woke pt from sleep.  Reviewed workup with pt.  Pt state no change in sx with GI  cocktail, but pt able to rest and sleep without any distress.  Discussed referral to GI for pt f/u but states he has not  money for f/u.  Will refer to clinics for re-eval.  Pepcid 20mg  #60 and Maalox prn.  Bland diet.  Return for worse pain, n/v/d, fevers, dysuria, or problems.      Medical Decision Making     Differential diagnosis considered in this case:  Gastritis, pancreatitis, enteritis, UTI      I reviewed the vital signs, available nursing notes, past medical history, past surgical history, family history and social history.    Vital Signs-Reviewed the patient's vital signs.     Patient Vitals for the past 12 hrs:   BP Temp Pulse Resp   07/24/13 0138 121/79 mmHg 97.4 F (36.3 C) 77 16         Diagnosis and Treatment Plan       Clinical Impression:   1. Gastritis    2. Abdominal pain            Attending's Note (MLP) Attestestation           Tula Nakayama, PA  07/24/13 0411    Judi Cong, MD  07/24/13 [phone]

## 2013-07-27 LAB — FTA-ABS: FTA-ABS: REACTIVE — AB

## 2013-07-29 NOTE — Progress Notes (Signed)
Quick Note:    FTA-ABS results have been reviewed by Dr. Rush Farmer. Note: received Penicillin G 2.4 million units on 07/22/2013. No change in treatment.  ______

## 2013-10-14 ENCOUNTER — Other Ambulatory Visit: Payer: Self-pay | Admitting: Family Medicine

## 2013-10-14 DIAGNOSIS — I8390 Asymptomatic varicose veins of unspecified lower extremity: Secondary | ICD-10-CM | POA: Insufficient documentation

## 2013-10-14 DIAGNOSIS — R600 Localized edema: Secondary | ICD-10-CM | POA: Insufficient documentation

## 2013-10-14 DIAGNOSIS — M79605 Pain in left leg: Secondary | ICD-10-CM | POA: Insufficient documentation

## 2013-10-14 DIAGNOSIS — R609 Edema, unspecified: Secondary | ICD-10-CM

## 2013-10-19 ENCOUNTER — Ambulatory Visit
Admission: RE | Admit: 2013-10-19 | Discharge: 2013-10-19 | Disposition: A | Discharge status: Home / Self Care | Payer: No Typology Code available for payment source | Source: Ambulatory Visit | Attending: Family Medicine | Admitting: Family Medicine

## 2013-10-19 DIAGNOSIS — M79609 Pain in unspecified limb: Secondary | ICD-10-CM | POA: Insufficient documentation

## 2013-10-19 DIAGNOSIS — R609 Edema, unspecified: Secondary | ICD-10-CM | POA: Insufficient documentation

## 2013-10-20 ENCOUNTER — Ambulatory Visit: Payer: Charity

## 2013-11-11 DIAGNOSIS — J309 Allergic rhinitis, unspecified: Secondary | ICD-10-CM | POA: Insufficient documentation

## 2014-03-29 ENCOUNTER — Emergency Department
Admission: EM | Admit: 2014-03-29 | Discharge: 2014-03-29 | Disposition: A | Discharge status: Home / Self Care | Payer: Self-pay | Attending: Emergency Medicine | Admitting: Emergency Medicine

## 2014-03-29 ENCOUNTER — Emergency Department: Payer: Self-pay

## 2014-03-29 DIAGNOSIS — B349 Viral infection, unspecified: Secondary | ICD-10-CM | POA: Insufficient documentation

## 2014-03-29 DIAGNOSIS — F17210 Nicotine dependence, cigarettes, uncomplicated: Secondary | ICD-10-CM | POA: Insufficient documentation

## 2014-03-29 DIAGNOSIS — R011 Cardiac murmur, unspecified: Secondary | ICD-10-CM | POA: Insufficient documentation

## 2014-03-29 DIAGNOSIS — R1013 Epigastric pain: Secondary | ICD-10-CM | POA: Insufficient documentation

## 2014-03-29 LAB — CBC AND DIFFERENTIAL
Basophils Absolute Automated: 0.01 10*3/uL (ref 0.00–0.20)
Basophils Automated: 0 %
Eosinophils Absolute Automated: 0.05 10*3/uL (ref 0.00–0.70)
Eosinophils Automated: 1 %
Hematocrit: 42 % (ref 42.0–52.0)
Hgb: 14.2 g/dL (ref 13.0–17.0)
Immature Granulocytes Absolute: 0.03 10*3/uL
Immature Granulocytes: 0 %
Lymphocytes Absolute Automated: 0.86 10*3/uL (ref 0.50–4.40)
Lymphocytes Automated: 11 %
MCH: 31.6 pg (ref 28.0–32.0)
MCHC: 33.8 g/dL (ref 32.0–36.0)
MCV: 93.3 fL (ref 80.0–100.0)
MPV: 10.3 fL (ref 9.4–12.3)
Monocytes Absolute Automated: 0.55 10*3/uL (ref 0.00–1.20)
Monocytes: 7 %
Neutrophils Absolute: 6.42 10*3/uL (ref 1.80–8.10)
Neutrophils: 81 %
Nucleated RBC: 0 /100 WBC (ref 0–1)
Platelets: 170 10*3/uL (ref 140–400)
RBC: 4.5 10*6/uL — ABNORMAL LOW (ref 4.70–6.00)
RDW: 13 % (ref 12–15)
WBC: 7.89 10*3/uL (ref 3.50–10.80)

## 2014-03-29 LAB — URINALYSIS, REFLEX TO MICROSCOPIC EXAM IF INDICATED
Glucose, UA: NEGATIVE
Leukocyte Esterase, UA: NEGATIVE
Nitrite, UA: NEGATIVE
Specific Gravity UA: 1.035 (ref 1.001–1.035)
Urine pH: 6 (ref 5.0–8.0)
Urobilinogen, UA: 0.2 mg/dL (ref 0.2–2.0)

## 2014-03-29 LAB — COMPREHENSIVE METABOLIC PANEL
ALT: 19 U/L (ref 0–55)
AST (SGOT): 25 U/L (ref 5–34)
Albumin/Globulin Ratio: 1.3 (ref 0.9–2.2)
Albumin: 4 g/dL (ref 3.5–5.0)
Alkaline Phosphatase: 65 U/L (ref 38–106)
Anion Gap: 9 (ref 5.0–15.0)
BUN: 12 mg/dL (ref 9.0–28.0)
Bilirubin, Total: 0.9 mg/dL (ref 0.2–1.2)
CO2: 26 mEq/L (ref 22–29)
Calcium: 9.2 mg/dL (ref 8.5–10.5)
Chloride: 105 mEq/L (ref 100–111)
Creatinine: 1 mg/dL (ref 0.7–1.3)
Globulin: 3 g/dL (ref 2.0–3.6)
Glucose: 100 mg/dL (ref 70–100)
Potassium: 4.2 mEq/L (ref 3.5–5.1)
Protein, Total: 7 g/dL (ref 6.0–8.3)
Sodium: 140 mEq/L (ref 136–145)

## 2014-03-29 LAB — GFR: EGFR: >60

## 2014-03-29 LAB — LACTIC ACID, PLASMA: Lactic Acid: 1.2 mmol/L (ref 0.2–2.0)

## 2014-03-29 LAB — HEMOLYSIS INDEX: Hemolysis Index: 8 (ref 0–18)

## 2014-03-29 MED ORDER — FAMOTIDINE 10 MG/ML IV SOLN (WRAP)
20.0000 mg | Freq: Once | INTRAVENOUS | Status: AC
Start: 2014-03-29 — End: 2014-03-29
  Administered 2014-03-29: 20 mg via INTRAVENOUS
  Filled 2014-03-29: qty 2

## 2014-03-29 MED ORDER — RANITIDINE HCL 150 MG PO TABS
150.0000 mg | ORAL_TABLET | Freq: Two times a day (BID) | ORAL | Status: DC
Start: 2014-03-29 — End: 2014-09-13

## 2014-03-29 MED ORDER — ONDANSETRON 4 MG PO TBDP
4.0000 mg | ORAL_TABLET | Freq: Four times a day (QID) | ORAL | Status: DC | PRN
Start: 2014-03-29 — End: 2014-09-13

## 2014-03-29 MED ORDER — KETOROLAC TROMETHAMINE 30 MG/ML IJ SOLN
30.0000 mg | Freq: Once | INTRAMUSCULAR | Status: AC
Start: 2014-03-29 — End: 2014-03-29
  Administered 2014-03-29: 30 mg via INTRAVENOUS
  Filled 2014-03-29: qty 1

## 2014-03-29 MED ORDER — LIDOCAINE VISCOUS 2 % MT SOLN
10.0000 mL | Freq: Once | OROMUCOSAL | Status: AC
Start: 2014-03-29 — End: 2014-03-29
  Administered 2014-03-29: 10 mL via OROMUCOSAL
  Filled 2014-03-29: qty 15

## 2014-03-29 MED ORDER — ALUM & MAG HYDROXIDE-SIMETH 200-200-20 MG/5ML PO SUSP
30.0000 mL | Freq: Once | ORAL | Status: AC
Start: 2014-03-29 — End: 2014-03-29
  Administered 2014-03-29: 30 mL via ORAL
  Filled 2014-03-29: qty 30

## 2014-03-29 MED ORDER — SODIUM CHLORIDE 0.9 % IV BOLUS
1000.0000 mL | Freq: Once | INTRAVENOUS | Status: AC
Start: 2014-03-29 — End: 2014-03-29
  Administered 2014-03-29: 1000 mL via INTRAVENOUS

## 2014-03-29 MED ORDER — DICYCLOMINE HCL 10 MG PO CAPS
10.0000 mg | ORAL_CAPSULE | Freq: Four times a day (QID) | ORAL | Status: AC | PRN
Start: 2014-03-29 — End: 2014-04-13

## 2014-03-29 NOTE — ED Notes (Signed)
Body aches, chills, abdominal pain, diarrhea, no vomiting.Denies any sore throat, cough or cold.

## 2014-03-29 NOTE — ED Notes (Signed)
Declined Rapid HIV Testing 03/29/2014

## 2014-03-29 NOTE — Discharge Instructions (Signed)
Epigastric Abdominal Pain (Cause Unspecified)    You were treated for epigastric abdominal (belly) pain. We don't know the cause of the pain yet.    Epigastric pain is located in the center of your upper belly right under your ribs.     There are several common causes of epigastric abdominal pain. These may include:     Gastroesophageal reflux disease (GERD) or heartburn: This is the most common cause. It usually happens right after eating.      Gastritis: This is inflammation of your stomach.     Lactose Intolerance: This means the body can't digest lactose. This is found in milk and other dairy products.     Pancreatitis: This is when your pancreas gets inflamed. Often the pain can be felt in the back.     Ulcers in your stomach or intestine.    Your doctor diagnosed your condition by your physical exam and your history. You may have also had imaging or blood work done to make sure you don't have any serious problems.     Treatment of epigastric abdominal pain focuses on making symptoms better. Fluids and electrolytes (sodium and potassium) may be given through an IV. Pain medications may be given.     Your doctor may give you something to lower acid production or coat the stomach lining.    Acid Reducing Medications - These medications lower the amount of acid made in your stomach. This helps the inflammation in the stomach lining heal. Scheduling and dosing can vary, so follow the instructions carefully. Two of the common types of antacid medications are H2 blockers and proton pump inhibitors (PPIs). They include:    - H2 famotidine (Pepcid)  - H2 ranitidine (Zantac)  - PPI omeprazole (Prilosec)  - PPI lansoprazole (Prevacid)  - PPI esomeprazole (Nexium)  - PPI pantoprazole (Protonix)    Acid protective medications - These stick to damaged ulcer tissue and protect against acid and enzymes. This way, the ulcer can heal.     - Sucralafate (Carafate).    You have been evaluated, treated,  and observed by your doctor.Your doctor feels thatyour condition has stabilized and it is safe for you to go home.    Though we don't believe your condition is dangerous right now, it is important to be careful. Sometimes a problem that seems mild can become serious later. This is why it is very important that you return here or go to the nearest Emergency Department if you are not improving or your symptoms are getting worse.    Your doctor may have you follow up with your primary care doctor as an outpatient to check your condition. Follow up as directed.    Your doctor may prescribe you pain medications to treat yourpain. You can use over-the-counter medicines like acetaminophen (Tylenol). It is important to follow the directions for taking these medications.    Stool softeners - These medications help with the constipation caused by narcotic medications. You can use over-the-counter laxatives like milk of magnesia or magnesium citrate.    YOU SHOULD SEEK MEDICAL ATTENTION IMMEDIATELY, EITHER HERE OR AT THE NEAREST EMERGENCY DEPARTMENT, IF ANY OF THE FOLLOWING OCCUR:   You have sudden severe pain in your belly or chest.   Your pain gets worse or does not go away.   You throw up blood or see blood in your stool. Blood may be bright red or dark black and tarry.   Your skin or eyes look yellow or your   or your urine (pee) looks dark brown.   You get a fever (temperature higher than 100.49F or 38C) or shaking chills.     If you can t follow up with your doctor, or if at any time you feel you need to be rechecked or seen again, come back here or go to the nearest emergency department.              Abdominal Pain, Appendicitis     You have been seen for Abdominal Pain.    The doctor that saw you today was worried that your abdominal pain could be from appendicitis. Appendicitis is when a small part of your bowel gets inflamed and infected. It can happen at any age, but often happens to people 41-80 years old.  People with appendicitis usually need surgery to get the appendix out. Not getting a surgery can be dangerous.    The tests you had today were not able to tell us if you have appendicitis or not. Appendicitis develops along a spectrum. This means that appendicitis may look like a minor problem at first but then it progresses and gets worse over time. You will need another exam within 12-24 hours to see how your symptoms change over time. At this time you do not need to stay in the hospital.    We don't believe your condition is dangerous right now. However, you need to be careful. Symptoms of appendicitis can progress. If you have these symptoms, come back sooner before your scheduled reexamination.     Abdominal pain that centers in the right lower part of the belly.   Loss of appetite.   Throwing up a lot.   You have a fever (temperature higher than 100.49F or 38C) or chills.   Abdominal Pain that gets worse with coughing or any sudden movement.    Follow the instructions for any medication you get prescribed.     It is VERY IMPORTANT to come back here (or to your doctor if instructed) to get your belly re-checked. The doctor should tell you if he/she wants you to return in 12 or 24 hours.    YOU SHOULD SEEK MEDICAL ATTENTION IMMEDIATELY, EITHER HERE OR AT THE NEAREST EMERGENCY DEPARTMENT, IF ANY OF THE FOLLOWING OCCURS:     You have belly pain that centers in the right lower part of the belly.   Your pain does not go away or gets worse.   You have a loss of appetite.   Throwing up a lot where you cannot keep fluids down or your vomit is dark green.    Any other symptoms, concerns, or not getting better as expected.   You get worse or feel you can t wait until your follow-up appointment to get treated.    If you can t follow up with your doctor, or if at any time you feel you need to be rechecked or seen again, come back here or go to the nearest emergency department.            Viral  Syndrome    You have been diagnosed with a viral syndrome. This is also known as a "cold."    Some symptoms of a viral syndrome are fever (temperature higher than 100.49F / 38C) and headache. There may be stuffy nose, nasal congestion and sore throat. Cough and muscle aches are other symptoms. Sometimes there is a rash. Some people may also have nausea (sick to the stomach). Some have vomiting (throwing up)  and/or diarrhea (loose stool).    Unfortunately, there is still no cure for the common cold! Antibiotics DO NOT work for viral illnesses. They may cause side-effects like rash or diarrhea (loose stool). Also, when antibiotics are used when not needed, they may not work for future bacterial infections. Most patients get better with simple things like rest, fluids and a little time. Acetaminophen (Tylenol) and/or ibuprofen (Advil or Motrin) can also help.    NEVER take ASPIRIN products with a viral infection. Aspirin may cause liver failure with certain viral infections.    Routine follow-up with your primary care doctor is recommended.    YOU SHOULD SEEK MEDICAL ATTENTION IMMEDIATELY, EITHER HERE OR AT THE NEAREST EMERGENCY DEPARTMENT, IF ANY OF THE FOLLOWING OCCURS:   Severe headache or stiff neck.   Uncontrolled vomiting.   Lightheadedness, feeling faint, or passing out.   Trouble breathing or shortness of breath.   Weakness or not able to walk.   Any other concerning symptoms or concerns.   You don t get better over 7 days or feel worse at any time.            [place] [facility] [place]   [email or web address]    Adult Medicine and [facility]  [place], [place] [address]   [phone]    Pediatrics, [place], [place], [place] [address], [phone]    [facility] Support and [place], [phone]      ---------------------------------------------------------------------------------------  [facility], [address], [place], [place] [address], [phone]  [email or web address]    [facility] [place] [facility], [id number], [place], [place] [address], [phone]         ---------------------------------------------------------------------------------------  (Greater) [facility], [facility] [address] [place], [place], [place] [address], [phone], [address], [place], [place] [address], [phone]    ---------------------------------------------------------------------------------------    Health Departments    ---------------------------------------------------------------------------------------    [facility], [address] [place], [place] [address], [phone]  [email or web address]    [facility], [place] [address], [place], [place] [address], [phone]  [email or web address]    ---------------------------------------------------------------------------------------  [facility], [address], [place] [address], [phone]    ---------------------------------------------------------------------------------------  [facility], [address] [place], [place], [place] [address], [place], [id number], [place], [place] [address], [phone]  [email or web address], [address] [place], [place] [address], [phone], [address], [place], [place] [address], [phone]    [facility], [place] 1st Floor, [place], [place] [address], [phone]    ---------------------------------------------------------------------------------------  [place], [address], [place], [place], [phone]    ---------------------------------------------------------------------------------------  [facility], [address] [place], [place], [place] [address], [facility], [phone]    ---------------------------------------------------------------------------------------  [facility], [address], [place], [place] [address], [facility], [address], [place], [place] [address], [phone]    [facility], [place], [place] [address], [place],  [address] [place], [place], [place] [address], [phone]    ---------------------------------------------------------------------------------------    Free Clinics    ---------------------------------------------------------------------------------------  ---------------------------------------------------------------------------------------  [facility],   [email or web address]    [facility],  [email or web address]://[email or web address]    [facility],   [email or web address]    [facility], [email or web address]    [facility] [facility], www.vafreeclinics.org/[place]-free-clinics.asp#culc    ---------------------------------------------------------------------------------------    Other Resources    ---------------------------------------------------------------------------------------  ---------------------------------------------------------------------------------------  [facility], [place] [address] [place], [place] [address], [phone]  [email or web address], [address], [place], [place] [address], [phone]  [email or web address]    [facility], [address] [place], [place], [place] [address], [phone]    ---------------------------------------------------------------------------------------    Dental    ---------------------------------------------------------------------------------------  ---------------------------------------------------------------------------------------  [facility], [address], [place], [place] [address], [phone]    [facility], [address] [place], [place], [place] [address], [phone]  By appointment only    [facility]  [address] [place], [place] [address], [place]  [address] [place], [place], [place], [id number], [place], [place], [address], [place]. [place], [place], [place]  [place], [id number], [place], [place], [place]       ---------------------------------------------------------------------------------------  [place]    ---------------------------------------------------------------------------------------  ---------------------------------------------------------------------------------------  [facility] for Health Coverage Education, http://www.[email or web address]    [employer]Health[employer]urance,   http://www.ehealthinsurance.[place],   http://www.healthinsurancesort.com    Health Insurance Online,   http://www.online-health-insurance.com    Health Plan One,   http://www.healthplanone.com    PodExchange.nl,    http://www.healthcare.[place], http://www.novaclinics.org/home  [facility])    ---------------------------------------------------------------------------------------    Medication  Resources    ---------------------------------------------------------------------------------------  ---------------------------------------------------------------------------------------  [employer], http://www.fairfaxrxdiscountcard.com, [phone], EXT 5 Helpdesk    Partnership for Prescriptions Assistance,  http://www.pparx.org    NeedyMeds,       http://www.needymeds.[employer],    [place] [employer],      GermanNightclub.ch    [place] (NAIC),      http://www.insureuonline.[place],     StatisticsWire.com.au

## 2014-03-29 NOTE — ED Provider Notes (Signed)
EMERGENCY DEPARTMENT HISTORY AND PHYSICAL EXAM     Physician/Midlevel provider first contact with patient: 03/29/14 1756         Date: 03/29/2014  Patient Name: [name] [name]    History of Presenting Illness     Chief Complaint   Patient presents with   . Generalized Body Aches     chills       History Provided By: Patient    Chief Complaint: Abdominal pain  Onset: Last night  Timing: Constant  Location: Upper abdomen  Quality: Similar to pain from GERD  Severity: Mild-moderate  Modifying Factors: None  Associated Symptoms: Fatigue, fever, chills, chest pain, nausea, diarrhea    Additional History: [name] [name] is a 49 y.o. male presenting to the ED with constant upper abdominal pain which began last night. Pt notes a similar episode of pain which he states was due to his GERD. Pt states that he used to take prilosec but has not taken it recently. He also reports fatigue, fever, chills, chest pain, nausea and diarrhea. Pt states his symptoms began after eating pizza. He did not take any medications. He denies cough, dysuria, or sore throat.  He denies any other PMHx. He denies sick contacts. Pt did not receive a flu shot this year.     PCP: Pcp, Noneorunknown, MD      No current facility-administered medications for this encounter.     Current Outpatient Prescriptions   Medication Sig Dispense Refill   . dicyclomine (BENTYL) 10 MG capsule Take 1 capsule (10 mg total) by mouth every 6 (six) hours as needed. 30 capsule 0   . ondansetron (ZOFRAN ODT) 4 MG disintegrating tablet Take 1 tablet (4 mg total) by mouth every 6 (six) hours as needed for Nausea. 8 tablet 0   . ranitidine (ZANTAC) 150 MG tablet Take 1 tablet (150 mg total) by mouth 2 (two) times daily. 60 tablet 0       Past History     Past Medical History:  Past Medical History   Diagnosis Date   . Heart murmur    . Gallstones    . Gastritis        Past Surgical History:  History reviewed. No pertinent past surgical history.    Family History:  Family History    Problem Relation Age of Onset   . Diabetes Mother    . Hypertension Mother        Social History:  History   Substance Use Topics   . Smoking status: Current Some Day Smoker -- 1.00 packs/day for .5 years   . Smokeless tobacco: Not on file   . Alcohol Use: No      Comment: pt claims he "is not a drinker", admits to ETOH use tonight       Allergies:  Allergies   Allergen Reactions   . Gluten      Pt states "white bread"   . Percocet [Oxycodone-Acetaminophen]    . Tylenol [Acetaminophen] Hives     Tyelnol#3, hives and throw up       Review of Systems     Review of Systems   Constitutional: Positive for fever, chills and fatigue.   HENT: Negative for sore throat.    Eyes: Negative for discharge.   Respiratory: Negative for cough.    Cardiovascular: Positive for chest pain.   Gastrointestinal: Positive for nausea, abdominal pain and diarrhea. Negative for vomiting.   Genitourinary: Negative for dysuria.   Musculoskeletal: Negative  for gait problem.   Skin: Negative for rash.   Allergic/Immunologic: Positive for food allergies (gluten).        + allergies to percocet and tylenol   Psychiatric/Behavioral: Negative for suicidal ideas.         Physical Exam   BP 127/78 mmHg  Pulse 97  Temp(Src) 100.2 F (37.9 C) (Oral)  Resp 16  Ht 5' 8.5" (1.74 m)  Wt 79.379 kg  BMI 26.22 kg/m2  SpO2 98%    Physical Exam   Constitutional: He is oriented to person, place, and time and well-developed, well-nourished, and in no distress.   HENT:   Head: Normocephalic and atraumatic.   Nose: Nose normal.   Mouth/Throat: Oropharynx is clear and moist.   Eyes: Conjunctivae are normal. Right eye exhibits no discharge. Left eye exhibits no discharge.   Neck: Normal range of motion. Neck supple.   Cardiovascular: Normal rate, regular rhythm, normal heart sounds and intact distal pulses.  Exam reveals no gallop and no friction rub.    No murmur heard.  Pulmonary/Chest: Effort normal and breath sounds normal. No respiratory distress. He has  no wheezes. He has no rales. He exhibits no tenderness.   Abdominal: Soft. Bowel sounds are normal. He exhibits no distension and no mass. There is tenderness (mild epigastric). There is no rebound and no guarding.   No RLQ tenderness. No RUQ tenderness   Musculoskeletal: Normal range of motion. He exhibits no edema or tenderness.   Lymphadenopathy:     He has no cervical adenopathy.   Neurological: He is alert and oriented to person, place, and time. Gait normal. GCS score is 15.   Skin: Skin is warm and dry. No rash noted. He is not diaphoretic. No erythema.   Psychiatric: Memory and affect normal.   Nursing note and vitals reviewed.        Diagnostic Study Results     Labs -     Results     Procedure Component Value Units Date/Time    UA, Reflex to Microscopic [[id number]]  (Abnormal) Collected:  03/29/14 1748    Specimen Information:  Urine Updated:  03/29/14 1826     Urine Type Clean Catch      Color, UA Yellow      Clarity, UA Hazy      Specific Gravity UA 1.035      Urine pH 6.0      Leukocyte Esterase, UA Negative      Nitrite, UA Negative      Protein, UR Trace (A)      Glucose, UA Negative      Ketones UA Trace (A)      Urobilinogen, UA 0.2 mg/dL      Bilirubin, UA Small (A)      Blood, UA Trace (A)      RBC, UA 6 - 10 (A) /hpf      WBC, UA 0 - 5 /hpf      Squamous Epithelial Cells, Urine 0 - 5 /hpf      Yeast, UA Rare (A)      Urine Mucus Present     Comprehensive metabolic panel [[id number]] Collected:  03/29/14 1730    Specimen Information:  Blood Updated:  03/29/14 1807     Glucose 100 mg/dL      BUN 85.4 mg/dL      Creatinine 1.0 mg/dL      Sodium 627 mEq/L      Potassium 4.2 mEq/L  Chloride 105 mEq/L      CO2 26 mEq/L      CALCIUM 9.2 mg/dL      Protein, Total 7.0 g/dL      Albumin 4.0 g/dL      AST (SGOT) 25 U/L      ALT 19 U/L      Alkaline Phosphatase 65 U/L      Bilirubin, Total 0.9 mg/dL      Globulin 3.0 g/dL      Albumin/Globulin Ratio 1.3      Anion Gap 9.0     Hemolysis index [[id number]]  Collected:  03/29/14 1730     Hemolysis Index 8 Updated:  03/29/14 1807    GFR [[id number]] Collected:  03/29/14 1730     EGFR >60.0 Updated:  03/29/14 1807    Rapid influenza A/B antigens [[id number]] Collected:  03/29/14 1730    Specimen Information:  Nasopharyngeal / Nasal Aspirate Updated:  03/29/14 1756    Narrative:      ORDER#: [id number]                                    ORDERED BY: CHUNG, SORA  SOURCE: Nasal Aspirate                               COLLECTED:  03/29/14 17:30  ANTIBIOTICS AT COLL.:                                RECEIVED :  03/29/14 17:41  Influenza Rapid Antigen A&B                FINAL       03/29/14 17:56  03/29/14   Negative for Influenza A and B             Reference Range: Negative      CBC with differential [[id number]]  (Abnormal) Collected:  03/29/14 1730    Specimen Information:  Blood / Blood Updated:  03/29/14 1749     WBC 7.89 x10 3/uL      Hgb 14.2 g/dL      Hematocrit 02.7 %      Platelets 170 x10 3/uL      RBC 4.50 (L) x10 6/uL      MCV 93.3 fL      MCH 31.6 pg      MCHC 33.8 g/dL      RDW 13 %      MPV 10.3 fL      Neutrophils 81 %      Lymphocytes Automated 11 %      Monocytes 7 %      Eosinophils Automated 1 %      Basophils Automated 0 %      Immature Granulocyte 0 %      Nucleated RBC 0 /100 WBC      Neutrophils Absolute 6.42 x10 3/uL      Abs Lymph Automated 0.86 x10 3/uL      Abs Mono Automated 0.55 x10 3/uL      Abs Eos Automated 0.05 x10 3/uL      Absolute Baso Automated 0.01 x10 3/uL      Absolute Immature Granulocyte 0.03 x10 3/uL     Lactic acid [[id number]] Collected:  03/29/14 1730    Specimen Information:  Blood Updated:  03/29/14 1736     Lactic acid 1.2 mmol/L           Radiologic Studies -   Radiology Results (24 Hour)     Procedure Component Value Units Date/Time    XR Chest 2 Views [[id number]] Collected:  03/29/14 1912    Order Status:  Completed Updated:  03/29/14 1917    Narrative:       CLINICAL INDICATION: fever; evaluate for pneumonia    COMPARISON:  10/21/2011    FINDINGS:   2 views of the chest were obtained. The lungs are clear. The  heart and vascularity are within normal limits. There is no pleural  thickening or effusion. No acute process of the bony thorax seen.      Impression:       No active cardiopulmonary disease.          Heron Nay, MD   03/29/2014 7:13 PM        .      Medical Decision Making   I am the first provider for this patient.    I reviewed the vital signs, available nursing notes, past medical history, past surgical history, family history and social history.    Vital Signs-Reviewed the patient's vital signs.     Patient Vitals for the past 12 hrs:   BP Temp Pulse Resp   03/29/14 2002 127/78 mmHg 100.2 F (37.9 C) 97 16   03/29/14 1652 120/86 mmHg 100.4 F (38 C) (!) 105 18       Pulse Oximetry Analysis - Normal 98% on RA    Old Medical Records: Old medical records.  Nursing notes.     ED Course:   8:12 PM - Pt states that he is feeling better after GI cocktail and toradol. Will PO challenge.    8:51 PM - Pt is feeling better, tolerating PO, and would like to go home. Discussed results, diagnosis, plan to follow up with Dr. Cindi Carbon (GI) and his PCP and to take zofran, bentyl, and zantac. Pt was counseled on signs and symptoms for when to return to the ED. He agrees with plan. Pt is stable and ready for discharge.     Provider Notes:   49yo male with history of GERD presents with fever and epigastric pain. He has normal LFTs. He has no RLQ tenderness. He has had some nausea, but no vomiting. He has had diarrhea. His epigastric pain improved with GI cocktail here. I suspect he has a viral syndrome. This would be an atypical presentation for acute appendicitis, but I can not completely exclude early disease. I discussed this at length with him and gave detailed return precautions. [place] home. PCP and GI follow up directed.    Diagnosis     Clinical Impression:   1. Acute viral syndrome    2. Epigastric pain         _______________________________    Attestations:  This note is prepared by Maximiano Coss, acting as Scribe for Louis Matte, MD.    Louis Matte, MD.  The scribe's documentation has been prepared under my direction and personally reviewed by me in its entirety.  I confirm that the note above accurately reflects all work, treatment, procedures, and medical decision making performed by me.    _______________________________          Louis Matte, MD  03/29/14 2242

## 2014-09-13 ENCOUNTER — Emergency Department
Admission: EM | Admit: 2014-09-13 | Discharge: 2014-09-13 | Disposition: A | Discharge status: Home / Self Care | Payer: Self-pay | Attending: Emergency Medicine | Admitting: Emergency Medicine

## 2014-09-13 ENCOUNTER — Emergency Department: Payer: Self-pay

## 2014-09-13 DIAGNOSIS — F17210 Nicotine dependence, cigarettes, uncomplicated: Secondary | ICD-10-CM | POA: Insufficient documentation

## 2014-09-13 DIAGNOSIS — L03116 Cellulitis of left lower limb: Secondary | ICD-10-CM | POA: Insufficient documentation

## 2014-09-13 MED ORDER — CEPHALEXIN 500 MG PO CAPS
500.0000 mg | ORAL_CAPSULE | Freq: Once | ORAL | Status: AC
Start: 2014-09-13 — End: 2014-09-13
  Administered 2014-09-13: 500 mg via ORAL
  Filled 2014-09-13: qty 1

## 2014-09-13 MED ORDER — SULFAMETHOXAZOLE-TRIMETHOPRIM 800-160 MG PO TABS
2.0000 | ORAL_TABLET | Freq: Two times a day (BID) | ORAL | Status: AC
Start: 2014-09-13 — End: 2014-09-20

## 2014-09-13 MED ORDER — CEPHALEXIN 500 MG PO CAPS
500.0000 mg | ORAL_CAPSULE | Freq: Four times a day (QID) | ORAL | Status: AC
Start: 2014-09-13 — End: 2014-09-20

## 2014-09-13 MED ORDER — SULFAMETHOXAZOLE-TRIMETHOPRIM 800-160 MG PO TABS
2.0000 | ORAL_TABLET | Freq: Once | ORAL | Status: AC
Start: 2014-09-13 — End: 2014-09-13
  Administered 2014-09-13: 2 via ORAL
  Filled 2014-09-13: qty 2

## 2014-09-13 NOTE — Discharge Instructions (Signed)
Cellulitis    You were diagnosed with cellulitis.    This is a bacterial infection of the skin. Symptoms are usually redness, swelling, and warmth in the affected area. Some people get a fever (temperature higher than 100.4F / 38C) with this infection.    Keep the extremity (arm or leg) above your heart level if possible.    Cellulitis is treated with antibiotics. It is also treated by keeping the affected area elevated (up). Sometimes, antibiotics are given intravenously ("IV"). Other infections can be treated with oral (by mouth) medicines.    Redness, swelling, warmth, and fever should start to get better after 2-3 days of treatment. Come back here or go to the nearest Emergency Department or your primary doctor for a re-check as directed.    YOU SHOULD SEEK MEDICAL ATTENTION IMMEDIATELY, EITHER HERE OR AT THE NEAREST EMERGENCY DEPARTMENT, IF ANY OF THE FOLLOWING OCCURS:   Redness spreads even with treatment. You can Whitt the infection area with a pen. This will help watch for improvement or spreading.   Fever (temperature higher than 100.4F / 38C) doesn't go away or gets worse after 2-3 days of antibiotics.   Unusual or increasing pain in the infected area.   Lightheadedness.   Feeling sicker at any time or not getting better as expected.

## 2014-09-13 NOTE — ED Notes (Signed)
Patient prescriptions were called in at [facility] at [phone]

## 2014-09-13 NOTE — ED Provider Notes (Signed)
Physician/Midlevel provider first contact with patient: 09/13/14 0204         History     Chief Complaint   Patient presents with   . Varicose Veins   . Cellulitis     49 year old male presents with left thigh redness and tenderness x 1 day without fevers or chills or drainage from the site. No other rashes, denies recent wound or problems with bleeding. Also complains of left lower leg swelling. States varicose veins seem bigger than usual over last day and concerned that he has a 'clot' in his leg. Complained about chest pain/pressure in triage but currently asymptomatic. No history of DVT PE        Past Medical History   Diagnosis Date   . Heart murmur    . Gallstones    . Gastritis        History reviewed. No pertinent past surgical history.    Family History   Problem Relation Age of Onset   . Diabetes Mother    . Hypertension Mother        Social  History   Substance Use Topics   . Smoking status: Current Some Day Smoker -- 1.00 packs/day for .5 years   . Smokeless tobacco: Not on file   . Alcohol Use: No      Comment: pt claims he "is not a drinker", admits to ETOH use tonight       .     Allergies   Allergen Reactions   . Gluten      Pt states "white bread"   . Percocet [Oxycodone-Acetaminophen]    . Tylenol [Acetaminophen] Hives     Tyelnol#3, hives and throw up       Home Medications                                             Review of Systems   Constitutional: Negative for fever, chills, activity change and fatigue.   Respiratory: Negative for cough, chest tightness, shortness of breath and wheezing.    Cardiovascular: Positive for leg swelling. Negative for chest pain and palpitations.   Gastrointestinal: Negative for nausea, vomiting, abdominal pain and diarrhea.   Genitourinary: Negative for dysuria, urgency, frequency and decreased urine volume.   Musculoskeletal: Negative for myalgias, back pain, arthralgias, neck pain and neck stiffness.   Skin: Positive for color change. Negative for pallor,  rash and wound.   Neurological: Negative for dizziness, weakness, light-headedness and headaches.   Hematological: Does not bruise/bleed easily.       Physical Exam    BP: 128/83 mmHg, Heart Rate: 82, Temp: 98.6 F (37 C), Resp Rate: 17, SpO2: 97 %, Weight: 77.111 kg     Physical Exam   Constitutional: He is oriented to person, place, and time. He appears well-developed and well-nourished. No distress.   HENT:   Head: Normocephalic and atraumatic.   Cardiovascular: Normal rate, regular rhythm, normal heart sounds and intact distal pulses.    Pulmonary/Chest: Effort normal and breath sounds normal. No respiratory distress. He exhibits no tenderness.   Abdominal: Soft. Bowel sounds are normal. He exhibits no distension. There is no tenderness.   Musculoskeletal:        Left hip: Normal.        Left knee: Normal.        Legs:  Neurological: He is  alert and oriented to person, place, and time. He displays normal reflexes. No cranial nerve deficit. He exhibits normal muscle tone.   Skin: Skin is warm and dry. No rash noted. He is not diaphoretic. There is erythema. No pallor.   Psychiatric: He has a normal mood and affect. His behavior is normal. Judgment and thought content normal.   Nursing note and vitals reviewed.        MDM and ED Course     ED Medication Orders     Start Ordered     Status Ordering Provider    09/13/14 [phone] 09/13/14 0338  sulfamethoxazole-trimethoprim (BACTRIM DS,SEPTRA DS) 800-160 MG per tablet 2 tablet   Once     Route: Oral  Ordered Dose: 2 tablet     Last MAR action:  Given Saunders Glance S    09/13/14 0339 09/13/14 0338  cephALEXin (KEFLEX) capsule 500 mg   Once     Route: Oral  Ordered Dose: 500 mg     Last MAR action:  Given Alson Mcpheeters S             MDM    Left leg pain, negative for DVT, treat left leg cellulitis and have pt f/u with PMD, return if worse, non-toxic.       Procedures    Clinical Impression & Disposition     Clinical Impression  Final diagnoses:   Left leg cellulitis           ED Disposition     Discharge Sam Overbeck [place] discharge to home/self care.    Condition at disposition: Stable             Discharge Medication List as of 09/13/2014  3:39 AM      START taking these medications    Details   cephALEXin (KEFLEX) 500 MG capsule Take 1 capsule (500 mg total) by mouth 4 (four) times daily., Starting 09/13/2014, Until Mon 09/20/14, Print      sulfamethoxazole-trimethoprim (BACTRIM DS) 800-160 MG per tablet Take 2 tablets by mouth 2 (two) times daily., Starting 09/13/2014, Until Mon 09/20/14, Print                         Darliss Cheney [place], [place]  09/13/14 9604    Donny Pique, MD  09/13/14 [phone]

## 2014-09-14 LAB — ECG 12-LEAD
Atrial Rate: 71 {beats}/min
P Axis: 51 degrees
P-R Interval: 158 ms
Q-T Interval: 396 ms
QRS Duration: 86 ms
QTC Calculation (Bezet): 430 ms
R Axis: -33 degrees
T Axis: -4 degrees
Ventricular Rate: 71 {beats}/min

## 2014-11-13 ENCOUNTER — Emergency Department: Payer: Medicaid Other

## 2014-11-13 ENCOUNTER — Emergency Department
Admission: EM | Admit: 2014-11-13 | Discharge: 2014-11-13 | Disposition: A | Discharge status: Home / Self Care | Payer: Medicaid Other | Attending: Emergency Medicine | Admitting: Emergency Medicine

## 2014-11-13 DIAGNOSIS — F17210 Nicotine dependence, cigarettes, uncomplicated: Secondary | ICD-10-CM | POA: Insufficient documentation

## 2014-11-13 DIAGNOSIS — I8393 Asymptomatic varicose veins of bilateral lower extremities: Secondary | ICD-10-CM | POA: Insufficient documentation

## 2014-11-13 DIAGNOSIS — R0789 Other chest pain: Secondary | ICD-10-CM | POA: Insufficient documentation

## 2014-11-13 MED ORDER — FAMOTIDINE 20 MG PO TABS
20.0000 mg | ORAL_TABLET | Freq: Once | ORAL | Status: DC
Start: 2014-11-13 — End: 2014-11-14

## 2014-11-13 MED ORDER — ACETAMINOPHEN 500 MG PO TABS
1000.0000 mg | ORAL_TABLET | Freq: Once | ORAL | Status: DC
Start: 2014-11-13 — End: 2014-11-13
  Filled 2014-11-13: qty 2

## 2014-11-13 MED ORDER — KETOROLAC TROMETHAMINE 30 MG/ML IJ SOLN
30.0000 mg | Freq: Once | INTRAMUSCULAR | Status: DC
Start: 2014-11-13 — End: 2014-11-14

## 2014-11-13 NOTE — ED Provider Notes (Signed)
EMERGENCY DEPARTMENT HISTORY AND PHYSICAL EXAM     Physician/Midlevel provider first contact with patient: 11/13/14 2102         Date: 11/13/2014  Patient Name: [name] [name]    History of Presenting Illness     Chief Complaint   Patient presents with   . Leg Swelling   . Shortness of Breath   . Chest Pain       History Provided By: Patient    Chief Complaint: chest pain  Onset: 1 hour ago  Timing: constant  Location: L sided  Quality: tightening  Severity: moderate  Exacerbating factors: breathing, postural changes  Alleviating factors: none  Associated Symptoms: left arm discomfort, SOB, bilateral leg swelling, bilateral hand numbness  Pertinent Negatives: cough, fever, congestion, nausea, vomiting, diaphoresis, hemoptysis, history of current symptoms, recent travel    Additional History: [name] [name] is a 49 y.o. male presenting to the ED with L sided chest pain for the past hour. It is constant in nature and describes as tightening. He has been experiencing bilateral leg swelling that has been intermittent over the past two weeks and developed leg pain symptoms in the past hour that he describes as sharp and located along posterior aspect of legs. He notes associated SOB and hand numbness for the past hour. He denies cigarette, drug, or alcohol use. He has not exercised in past 3 days.         PCP: Pcp, Noneorunknown, MD  SPECIALISTS:    Current Facility-Administered Medications   Medication Dose Route Frequency Provider Last Rate Last Dose   . famotidine (PEPCID) tablet 20 mg  20 mg Oral Once French Ana, MD       . ketorolac (TORADOL) injection 30 mg  30 mg Intramuscular Once French Ana, MD         No current outpatient prescriptions on file.       Past History     Past Medical History:  Past Medical History   Diagnosis Date   . Heart murmur    . Gallstones    . Gastritis    . Renal calculus        Past Surgical History:  History reviewed. No pertinent past surgical history.    Family  History:  Family History   Problem Relation Age of Onset   . Diabetes Mother    . Hypertension Mother        Social History:  Social History   Substance Use Topics   . Smoking status: Current Some Day Smoker -- 1.00 packs/day for .5 years   . Smokeless tobacco: None   . Alcohol Use: No      Comment: pt claims he "is not a drinker", admits to ETOH use tonight       Allergies:  Allergies   Allergen Reactions   . Gluten      Pt states "white bread"   . Percocet [Oxycodone-Acetaminophen]    . Tylenol [Acetaminophen] Hives     Tyelnol#3, hives and throw up       Review of Systems     Review of Systems   Constitutional: Negative for fever.   HENT: Negative for congestion, sore throat and trouble swallowing.    Eyes: Negative for discharge.   Respiratory: Positive for shortness of breath. Negative for cough.    Cardiovascular: Positive for chest pain and leg swelling.   Gastrointestinal: Negative for nausea, vomiting and abdominal pain.   Genitourinary: Negative for dysuria and flank pain.  Musculoskeletal: Positive for myalgias. Negative for neck pain.   Skin: Negative for rash.   Neurological: Positive for numbness. Negative for headaches.   Psychiatric/Behavioral: Negative for confusion.         Physical Exam   BP 121/74 mmHg  Pulse 90  Temp(Src) 99.2 F (37.3 C) (Oral)  Resp 20  Ht 5' 9.6" (1.768 m)  Wt 78.926 kg  BMI 25.25 kg/m2  SpO2 100%    Physical Exam   Constitutional: He is oriented to person, place, and time and well-developed, well-nourished, and in no distress. No distress.   HENT:   Head: Normocephalic and atraumatic.   Mouth/Throat: Oropharynx is clear and moist.   Eyes: EOM are normal. Pupils are equal, round, and reactive to light.   Neck: Normal range of motion. Neck supple.   Cardiovascular: Normal rate, regular rhythm and normal heart sounds.    Pulmonary/Chest: Effort normal and breath sounds normal. No respiratory distress. He has no wheezes. He has no rales. He exhibits tenderness.    Abdominal: Soft. Bowel sounds are normal. He exhibits no distension. There is no tenderness. There is no rebound.   Musculoskeletal: Normal range of motion. He exhibits no edema or tenderness.   Some engorged varicose veins bilateral LE   Neurological: He is alert and oriented to person, place, and time. No cranial nerve deficit. Gait normal. GCS score is 15.   Skin: Skin is warm and dry. He is not diaphoretic.   Psychiatric: Mood, memory, affect and judgment normal.   Nursing note and vitals reviewed.        Diagnostic Study Results     Labs -     Results     ** No results found for the last 24 hours. **          Radiologic Studies -   Radiology Results (24 Hour)     Procedure Component Value Units Date/Time    US Venous Dopp Low Extrem Comp Bilat [[id number]] Resulted:  11/13/14 2236    Order Status:  Sent Updated:  11/13/14 2236    Chest 2 Views [[id number]] Collected:  11/13/14 2132    Order Status:  Completed Updated:  11/13/14 2137    Narrative:      CLINICAL INDICATION: Acute left-sided chest pain    COMPARISON: 2016    INTERPRETATION: Two views were performed. No airspace disease, pleural  effusion, mass, or adenopathy is seen. No focal bony abnormality is  noted. Heart is not enlarged. The lungs are clear and costophrenic  angles are sharp.      Impression:       No acute process seen.    Charlene Brooke, MD   11/13/2014 9:33 PM        .    Medical Decision Making   I am the first provider for this patient.    I reviewed the vital signs, available nursing notes, past medical history, past surgical history, family history and social history.    Vital Signs-Reviewed the patient's vital signs.     Patient Vitals for the past 12 hrs:   BP Temp Pulse Resp   11/13/14 2039 121/74 mmHg 99.2 F (37.3 C) 90 20       Pulse Oximetry Analysis - Normal 100% on RA      EKG:  Interpreted by the EP.   Time Interpreted: 2043   Rate: 79 bpm   Rhythm: SR   Interpretation: Poor R wave progression. No ST abnormalities. Normal  intervals. LAD.    Comparison: Unchanged from EKG on 09/13/14      Old Medical Records: Old medical records.  Nursing notes.     ED Course:     2233. Re-evaluated patient, who states symptoms are improved. I recommend stretching exercises. I have reviewed imaging results, and he understands discharge instructions. The pt is stable for discharge, counseling is provided as documented, discussed symptomatic treatment and specific conditions for return.        Provider Notes: DDx- PE, ACS, pericarditis, pleuritis, musculoskeletal pain, dissection,    Pt presents with CP, likely musculoskeletal as pain reproducible with palpation,  EKG normal, labs and CXR normal. PERC negative, low risk for PE. U/S negative for DVT, swelling secondary to varicose veins. Pt stable for discharge, f/up PCP        Diagnosis     Clinical Impression:   1. Chest wall pain    2. Varicose veins of both lower extremities        Treatment Plan:   ED Disposition     Discharge [name] [name] discharge to home/self care.    Condition at disposition: Stable              _______________________________      Attestations: This note is prepared by Leonia Reader, acting as scribe for Dr. French Ana, MD. The scribe's documentation has been prepared under my direction and personally reviewed by me in its entirety.  I confirm that the note above accurately reflects all work, treatment, procedures, and medical decision making performed by me.    _______________________________      French Ana, MD  11/14/14 [phone]

## 2014-11-13 NOTE — ED Notes (Signed)
Pt bilateral lower edema, L>R, SOB, and left-sided chest pain radiating to left arm.  Symptoms began approx 1 hr PTA.  Denies recent travel.

## 2014-11-13 NOTE — Discharge Instructions (Signed)
Chest Pain of Unclear Etiology     You have been seen for chest pain. The cause of your pain is not yet known.     Your doctor has learned about your medical history, examined you, and checked any tests that were done. Still, it is unclear why you are having pain. The doctor thinks there is only a very small chance that your pain is caused by a life-threatening condition. Later, your primary care doctor might do more tests or check you again.     Sometimes chest pain is caused by a dangerous condition, like a heart attack, aorta injury, blood clot in the lung, or collapsed lung. It is unlikely that your pain is caused by a life-threatening condition if: Your chest pain lasts only a few seconds at a time; you are not short of breath, nauseated (sick to your stomach), sweaty, or lightheaded; your pain gets worse when you twist or bend; your pain improves with exercise or hard work.     Chest pain is serious. It is VERY IMPORTANT that you follow up with your regular doctor and seek medical attention immediately here or at the nearest Emergency Department if your symptoms become worse or they change.     YOU SHOULD SEEK MEDICAL ATTENTION IMMEDIATELY, EITHER HERE OR AT THE NEAREST EMERGENCY DEPARTMENT, IF ANY OF THE FOLLOWING OCCURS:  · Your pain gets worse.  · Your pain makes you short of breath, nauseated, or sweaty.  · Your pain gets worse when you walk, go up stairs, or exert yourself.  · You feel weak, lightheaded, or faint.  · It hurts to breathe.  · Your leg swells.  · Your symptoms get worse or you have new symptoms or concerns.

## 2014-11-14 LAB — ECG 12-LEAD
Atrial Rate: 79 {beats}/min
P Axis: 56 degrees
P-R Interval: 148 ms
Q-T Interval: 364 ms
QRS Duration: 88 ms
QTC Calculation (Bezet): 417 ms
R Axis: -36 degrees
T Axis: 2 degrees
Ventricular Rate: 79 {beats}/min

## 2015-02-04 ENCOUNTER — Emergency Department
Admission: EM | Admit: 2015-02-04 | Discharge: 2015-02-04 | Disposition: A | Discharge status: Home / Self Care | Payer: Medicaid Other | Attending: Emergency Medicine | Admitting: Emergency Medicine

## 2015-02-04 ENCOUNTER — Emergency Department: Payer: Medicaid Other

## 2015-02-04 DIAGNOSIS — R0789 Other chest pain: Secondary | ICD-10-CM

## 2015-02-04 DIAGNOSIS — F17210 Nicotine dependence, cigarettes, uncomplicated: Secondary | ICD-10-CM | POA: Insufficient documentation

## 2015-02-04 LAB — COMPREHENSIVE METABOLIC PANEL
ALT: 19 U/L (ref 0–55)
AST (SGOT): 25 U/L (ref 5–34)
Albumin/Globulin Ratio: 1.5 (ref 0.9–2.2)
Albumin: 4.3 g/dL (ref 3.5–5.0)
Alkaline Phosphatase: 66 U/L (ref 38–106)
Anion Gap: 9 (ref 5.0–15.0)
BUN: 15 mg/dL (ref 9–28)
Bilirubin, Total: 0.9 mg/dL (ref 0.2–1.2)
CO2: 27 mEq/L (ref 22–29)
Calcium: 9.6 mg/dL (ref 8.5–10.5)
Chloride: 101 mEq/L (ref 100–111)
Creatinine: 0.9 mg/dL (ref 0.7–1.3)
Globulin: 2.9 g/dL (ref 2.0–3.6)
Glucose: 92 mg/dL (ref 70–100)
Potassium: 4.1 mEq/L (ref 3.5–5.1)
Protein, Total: 7.2 g/dL (ref 6.0–8.3)
Sodium: 137 mEq/L (ref 136–145)

## 2015-02-04 LAB — GFR: EGFR: >60

## 2015-02-04 LAB — LIPASE: Lipase: 28 U/L (ref 8–78)

## 2015-02-04 LAB — CBC AND DIFFERENTIAL
Basophils Absolute Automated: 0.05 10*3/uL (ref 0.00–0.20)
Basophils Automated: 1 %
Eosinophils Absolute Automated: 0.34 10*3/uL (ref 0.00–0.70)
Eosinophils Automated: 4 %
Hematocrit: 38.1 % — ABNORMAL LOW (ref 42.0–52.0)
Hgb: 13.1 g/dL (ref 13.0–17.0)
Immature Granulocytes Absolute: 0.01 10*3/uL
Immature Granulocytes: 0 %
Lymphocytes Absolute Automated: 2.58 10*3/uL (ref 0.50–4.40)
Lymphocytes Automated: 32 %
MCH: 31.3 pg (ref 28.0–32.0)
MCHC: 34.4 g/dL (ref 32.0–36.0)
MCV: 91.1 fL (ref 80.0–100.0)
MPV: 9.7 fL (ref 9.4–12.3)
Monocytes Absolute Automated: 0.78 10*3/uL (ref 0.00–1.20)
Monocytes: 10 %
Neutrophils Absolute: 4.35 10*3/uL (ref 1.80–8.10)
Neutrophils: 54 %
Nucleated RBC: 0 /100 WBC (ref 0–1)
Platelets: 195 10*3/uL (ref 140–400)
RBC: 4.18 10*6/uL — ABNORMAL LOW (ref 4.70–6.00)
RDW: 13 % (ref 12–15)
WBC: 8.1 10*3/uL (ref 3.50–10.80)

## 2015-02-04 LAB — LACTIC ACID, PLASMA: Lactic Acid: 0.9 mmol/L (ref 0.2–2.0)

## 2015-02-04 LAB — B-TYPE NATRIURETIC PEPTIDE: B-Natriuretic Peptide: <10 pg/mL (ref 0–100)

## 2015-02-04 LAB — TROPONIN I: Troponin I: 0.01 ng/mL (ref 0.00–0.09)

## 2015-02-04 MED ORDER — FENTANYL CITRATE (PF) 50 MCG/ML IJ SOLN (WRAP)
50.0000 ug | Freq: Once | INTRAMUSCULAR | Status: AC
Start: 2015-02-04 — End: 2015-02-04
  Administered 2015-02-04: 50 ug via INTRAVENOUS
  Filled 2015-02-04: qty 2

## 2015-02-04 MED ORDER — SODIUM CHLORIDE 0.9 % IV BOLUS
1000.0000 mL | Freq: Once | INTRAVENOUS | Status: AC
Start: 2015-02-04 — End: 2015-02-04
  Administered 2015-02-04: 1000 mL via INTRAVENOUS

## 2015-02-04 MED ORDER — FAMOTIDINE 20 MG PO TABS
20.0000 mg | ORAL_TABLET | Freq: Two times a day (BID) | ORAL | Status: AC
Start: 2015-02-04 — End: 2015-02-09

## 2015-02-04 MED ORDER — FAMOTIDINE 10 MG/ML IV SOLN (WRAP)
20.0000 mg | Freq: Once | INTRAVENOUS | Status: AC
Start: 2015-02-04 — End: 2015-02-04
  Administered 2015-02-04: 20 mg via INTRAVENOUS
  Filled 2015-02-04: qty 2

## 2015-02-04 MED ORDER — KETOROLAC TROMETHAMINE 30 MG/ML IJ SOLN
30.0000 mg | Freq: Once | INTRAMUSCULAR | Status: AC
Start: 2015-02-04 — End: 2015-02-04
  Administered 2015-02-04: 30 mg via INTRAVENOUS
  Filled 2015-02-04: qty 1

## 2015-02-04 MED ORDER — ALUM & MAG HYDROXIDE-SIMETH 200-200-20 MG/5ML PO SUSP
30.0000 mL | Freq: Once | ORAL | Status: AC
Start: 2015-02-04 — End: 2015-02-04
  Administered 2015-02-04: 30 mL via ORAL
  Filled 2015-02-04: qty 30

## 2015-02-04 MED ORDER — LIDOCAINE VISCOUS 2 % MT SOLN
10.0000 mL | Freq: Once | OROMUCOSAL | Status: AC
Start: 2015-02-04 — End: 2015-02-04
  Administered 2015-02-04: 10 mL via OROMUCOSAL
  Filled 2015-02-04: qty 15

## 2015-02-04 NOTE — ED Notes (Signed)
Called Rad for portable chest. They will be over

## 2015-02-04 NOTE — ED Notes (Signed)
MD at bedside. 

## 2015-02-04 NOTE — ED Notes (Signed)
Pt arrives c/o sharp chest pain and epigastric abdominal pain starting 1 hour ago when he woke up.

## 2015-02-04 NOTE — ED Provider Notes (Signed)
EMERGENCY DEPARTMENT HISTORY AND PHYSICAL EXAM    Date Time: 02/08/2015 12:23 PM  Patient Name: [name] [name], 49 y.o., male  ED Provider: Sonda Primes, MD    History of Presenting Illness:     Chief Complaint: left sided abdominal pain/chest pain  History obtained from: Patient.  Onset/Duration: few hours ago  Quality: pain  Severity severe  Aggravating Factors: none  Alleviating Factors: none  Associated Symptoms: none  Narrative/Additional Historical Findings:[name] [name] is a 49 y.o. male  Who is presenting with the above cc.  History is limited as the patient is refusing to answer questions and screaming in the room.    Nursing notes from this date of service were reviewed.    Past Medical History:     Past Medical History   Diagnosis Date   . Heart murmur    . Gallstones    . Gastritis    . Renal calculus        Past Surgical History:   History reviewed. No pertinent past surgical history.    Family History:     Family History   Problem Relation Age of Onset   . Diabetes Mother    . Hypertension Mother        Social History:     Social History     Social History   . Marital Status: Single     Spouse Name: N/A   . Number of Children: N/A   . Years of Education: N/A     Social History Main Topics   . Smoking status: Current Some Day Smoker -- 1.00 packs/day for .5 years     Types: Cigarettes   . Smokeless tobacco: Not on file   . Alcohol Use: No      Comment: pt claims he "is not a drinker", admits to ETOH use tonight   . Drug Use: Yes     Special: Cocaine      Comment: cocaine use in the past no use for 3 years   . Sexual Activity: Not on file     Other Topics Concern   . Not on file     Social History Narrative       Allergies:     Allergies   Allergen Reactions   . Gluten      Pt states "white bread"   . Percocet [Oxycodone-Acetaminophen]    . Tylenol [Acetaminophen] Hives     Tyelnol#3, hives and throw up       Medications:   No current facility-administered medications for this encounter.    Current  outpatient prescriptions:   .  famotidine (PEPCID) 20 MG tablet, Take 1 tablet (20 mg total) by mouth 2 (two) times daily., Disp: 10 tablet, Rfl: 0    Review of Systems:   Constitutional: No fever or change in activity.  Eyes: No eye redness. No eye discharge.  ENT: No ear pain or sore throat  Cardiovascular: No cp or palpitations  Respiratory: No cough or shortness of breath.  GI: No vomiting or diarrhea.  Genitourinary: Normal urination frequency  Musculoskeletal: No extremity pain or decreased use  Skin: no rash or skin lesions.  Neurologic: Normal level of alertness    All other systems reviewed and are negative    Physical Exam:   ED Triage Vitals   Enc Vitals Group      BP 02/04/15 0713 148/82 mmHg      Heart Rate 02/04/15 0713 88      Resp  Rate 02/04/15 0713 20      Temp 02/04/15 0713 98.3 F (36.8 C)      Temp Source 02/04/15 0713 Oral      SpO2 02/04/15 0713 100 %      Weight 02/04/15 0713 77.111 kg      Height 02/04/15 0713 1.727 m      Head Cir --       Peak Flow --       Pain Score 02/04/15 0713 8      Pain Loc --       Pain Edu? --       Excl. in GC? --      Constitutional: Vital signs reviewed. Well hydrated, well perfused, and no increased work of breathing. Appearance: disheveled appearance, screaming at the top of his lungs  Head:  Normocephalic, atraumatic  Eyes: No conjunctival injection. No discharge. EOMI  ENT: Mucous membranes moist, No oral lesions.  Neck: Normal range of motion. Non-tender.  Respiratory/Chest: Clear to auscultation. No respiratory distress.   Cardiovascular: Regular rate and rhythm. No murmur.   Abdomen: Soft and non-tender. No masses or hepatosplenomegaly.  Genitourinary:  UpperExtremity: No edema or cyanosis.  Moving well.  LowerExtremity: No edema or cyanosis.  Moving well.  Neurological: No focal motor deficits by observation. Speech normal. Memory normal.  Skin: Warm and dry. No rash.  Lymphatic: No cervical lymphadenopathy.  Psychiatric: Normal affect. Normal  concentration.    Labs:     Labs Reviewed   CBC AND DIFFERENTIAL - Abnormal; Notable for the following:     Hematocrit 38.1 (*)     RBC 4.18 (*)     All other components within normal limits   LIPASE   LACTIC ACID, PLASMA    Narrative:     Cancel if the initial lactate level is < 2.0 mmol/L.   COMPREHENSIVE METABOLIC PANEL   TROPONIN I   GFR   B-TYPE NATRIURETIC PEPTIDE         Rads:     Radiology Results (24 Hour)     ** No results found for the last 24 hours. **          MDM and ED Course   DR. Christophor Eick  is the primary attending for this patient and has obtained and performed the history, PE, and medical decision making for this patient.    MDM:    EKG Interpretation:   Signed and interpreted by ED Physician   Time Interpreted: 0717  Comparison: previous  Rate: 82  Rhythm: sinus   Axis: left   Intervals: normal  Blocks: normal  ST segments: No acute changes.   Interpretation: Normal EKG    Patient initially appeared to be distressed, IV fentanyl was ordered.  However upon chart review, it appears the patient is likely a frequent flier, and may be drug seeking.  Patient was seen ambulating to the bathroom without difficulty.  And tolerated a GI cocktail without difficulty.  On observation from outside of the room, he at times appeared to be sleeping, and then would start screaming at the top of his lungs.    Based on a normal EKG, normal labs, and absolutely normal vital signs and CXR - and multiple normal previous CT scans - will defer further management here in the ER, and discharge the patient.          Assessment/Plan:   Results and instructions reviewed at the bedside with patient and family.    Clinical Impression  Final diagnoses:   Other chest pain       Disposition  ED Disposition     Discharge [name] [name] discharge to home/self care.    Condition at disposition: Stable            Prescriptions  Discharge Medication List as of 02/04/2015  8:59 AM      START taking these medications    Details    famotidine (PEPCID) 20 MG tablet Take 1 tablet (20 mg total) by mouth 2 (two) times daily., Starting 02/04/2015, Until Wed 02/09/15, Print                 Signed by: Vito Backers, MD  02/08/15 1226

## 2015-02-04 NOTE — Discharge Instructions (Signed)
Chest Pain of Unclear Etiology     You have been seen for chest pain. The cause of your pain is not yet known.     Your doctor has learned about your medical history, examined you, and checked any tests that were done. Still, it is unclear why you are having pain. The doctor thinks there is only a very small chance that your pain is caused by a life-threatening condition. Later, your primary care doctor might do more tests or check you again.     Sometimes chest pain is caused by a dangerous condition, like a heart attack, aorta injury, blood clot in the lung, or collapsed lung. It is unlikely that your pain is caused by a life-threatening condition if: Your chest pain lasts only a few seconds at a time; you are not short of breath, nauseated (sick to your stomach), sweaty, or lightheaded; your pain gets worse when you twist or bend; your pain improves with exercise or hard work.     Chest pain is serious. It is VERY IMPORTANT that you follow up with your regular doctor and seek medical attention immediately here or at the nearest Emergency Department if your symptoms become worse or they change.     YOU SHOULD SEEK MEDICAL ATTENTION IMMEDIATELY, EITHER HERE OR AT THE NEAREST EMERGENCY DEPARTMENT, IF ANY OF THE FOLLOWING OCCURS:  · Your pain gets worse.  · Your pain makes you short of breath, nauseated, or sweaty.  · Your pain gets worse when you walk, go up stairs, or exert yourself.  · You feel weak, lightheaded, or faint.  · It hurts to breathe.  · Your leg swells.  · Your symptoms get worse or you have new symptoms or concerns.

## 2015-02-07 LAB — ECG 12-LEAD
Atrial Rate: 82 {beats}/min
P Axis: 47 degrees
P-R Interval: 144 ms
Q-T Interval: 376 ms
QRS Duration: 86 ms
QTC Calculation (Bezet): 439 ms
R Axis: -42 degrees
T Axis: 9 degrees
Ventricular Rate: 82 {beats}/min

## 2015-02-17 ENCOUNTER — Emergency Department: Payer: Medicaid Other

## 2015-02-17 ENCOUNTER — Emergency Department
Admission: EM | Admit: 2015-02-17 | Discharge: 2015-02-18 | Disposition: A | Discharge status: Home / Self Care | Payer: Medicaid Other | Attending: Emergency Medicine | Admitting: Emergency Medicine

## 2015-02-17 DIAGNOSIS — F17210 Nicotine dependence, cigarettes, uncomplicated: Secondary | ICD-10-CM | POA: Insufficient documentation

## 2015-02-17 DIAGNOSIS — R1010 Upper abdominal pain, unspecified: Secondary | ICD-10-CM | POA: Insufficient documentation

## 2015-02-17 DIAGNOSIS — K2900 Acute gastritis without bleeding: Secondary | ICD-10-CM | POA: Insufficient documentation

## 2015-02-17 DIAGNOSIS — D649 Anemia, unspecified: Secondary | ICD-10-CM | POA: Insufficient documentation

## 2015-02-17 DIAGNOSIS — R112 Nausea with vomiting, unspecified: Secondary | ICD-10-CM | POA: Insufficient documentation

## 2015-02-17 MED ORDER — PROMETHAZINE HCL 25 MG/ML IJ SOLN
12.5000 mg | Freq: Once | INTRAMUSCULAR | Status: AC
Start: 2015-02-17 — End: 2015-02-18
  Administered 2015-02-18: 12.5 mg via INTRAVENOUS
  Filled 2015-02-17: qty 1

## 2015-02-17 MED ORDER — SODIUM CHLORIDE 0.9 % IV BOLUS
1000.0000 mL | Freq: Once | INTRAVENOUS | Status: AC
Start: 2015-02-17 — End: 2015-02-18
  Administered 2015-02-18: 1000 mL via INTRAVENOUS

## 2015-02-17 NOTE — ED Provider Notes (Signed)
EMERGENCY DEPARTMENT HISTORY AND PHYSICAL EXAM     Physician/Midlevel provider first contact with patient: 02/17/15 2339         Date: 02/17/2015  Patient Name: [name] [name]    History of Presenting Illness     Chief Complaint   Patient presents with   . Chest Pain   . Abdominal Pain       History Provided By: Patient    Chief Complaint: Abdominal pain  Onset: 1 hour PTA  Location: Upper abdomen  Quality: bloating, like he wants to belch  Severity: Mild  Associated Symptoms: SOB, Chest pain, Nausea, Vomiting, Constipation, Dizziness  Pertinent Negatives: fever, cough, congestion, headache, dysuria    Additional History: [name] is a 49 y.o. male. He complains of one hour of upper abdominal pain. He describes the pain as feeling bloated, like he wants to belch but cannot. He also reports nausea and two episodes of vomiting. He also states he is constipated, although he had a bowel movement today, but there was less stool than normal. He also reports one hour of chest pain, SOB, and dizziness. He has not had any abdominal surgeries. He smokes cigarettes, but does not drink or do drugs. He is worried that his cranberry beverage may have been spiked when he went to the bathroom.    PCP: Pcp, Noneorunknown, MD      No current facility-administered medications for this encounter.     Current Outpatient Prescriptions   Medication Sig Dispense Refill   . esomeprazole (NEXIUM) 20 MG capsule Take 1 capsule (20 mg total) by mouth every morning before breakfast. 30 capsule 0   . promethazine (PHENERGAN) 25 MG tablet Take 0.5-1 tablets (12.5-25 mg total) by mouth every 6 (six) hours as needed for Nausea. 12 tablet 0   . traMADol (ULTRAM) 50 MG tablet Take 1 tablet (50 mg total) by mouth every 6 (six) hours as needed. Do not drive or operate machinery while taking this medication 10 tablet 0       Past History     Past Medical History:  Past Medical History   Diagnosis Date   . Heart murmur    . Gallstones    . Gastritis     . Renal calculus        Past Surgical History:  History reviewed. No pertinent past surgical history.    Family History:  Family History   Problem Relation Age of Onset   . Diabetes Mother    . Hypertension Mother        Social History:  Social History   Substance Use Topics   . Smoking status: Current Some Day Smoker -- 1.00 packs/day for .5 years     Types: Cigarettes   . Smokeless tobacco: None   . Alcohol Use: No      Comment: [name] [name] claims he "is not a drinker", admits to ETOH use tonight       Allergies:  Allergies   Allergen Reactions   . Gluten      [name] [name] states "white bread"   . Percocet [Oxycodone-Acetaminophen]    . Tylenol [Acetaminophen] Hives     Tyelnol#3, hives and throw up       Review of Systems     Review of Systems   Constitutional: Negative for fever.   HENT: Negative for nosebleeds.    Eyes: Negative for discharge and redness.   Respiratory: Positive for shortness of breath. Negative for cough.    Cardiovascular:  Positive for chest pain.   Gastrointestinal: Positive for nausea, vomiting, abdominal pain and constipation.   Genitourinary: Negative for dysuria.   Neurological: Positive for dizziness. Negative for headaches.   Endo/Heme/Allergies:        +Percocet and Tylenol allergies   Psychiatric/Behavioral: Negative for substance abuse.         Physical Exam   BP 140/83 mmHg  Pulse 58  Temp(Src) 98.5 F (36.9 C) (Oral)  Resp 15  Ht 5\' 8"  (1.727 m)  Wt 77.111 kg  BMI 25.85 kg/m2  SpO2 100%    Physical Exam   Constitutional:   WDWN, appears uncomfortable   HENT:   Head: Normocephalic and atraumatic.   Mouth/Throat: Oropharynx is clear and moist.   Eyes: Conjunctivae are normal. Pupils are equal, round, and reactive to light. Right eye exhibits no discharge. Left eye exhibits no discharge.   Cardiovascular: Normal rate, regular rhythm and normal heart sounds.    No murmur heard.  Pulmonary/Chest: Effort normal. No respiratory distress.   Diminished breath sounds B   Abdominal: Soft. He exhibits  no distension. There is tenderness (Mild diffuse ttp).   Musculoskeletal: Normal range of motion. He exhibits no edema.   Lymphadenopathy:     He has no cervical adenopathy.   Neurological: He is alert. GCS score is 15.   Skin: Skin is warm and dry.   Psychiatric: Mood and affect normal.   Nursing note and vitals reviewed.      Diagnostic Study Results     Labs -     Results     Procedure Component Value Units Date/Time    Troponin I [[id number]] Collected:  02/18/15 0007    Specimen Information:  Blood Updated:  02/18/15 0053     Troponin I <0.01 ng/mL     Comprehensive metabolic panel [[id number]] Collected:  02/18/15 0007    Specimen Information:  Blood Updated:  02/18/15 0047     Glucose 92 mg/dL      BUN 56.2 mg/dL      Creatinine 0.9 mg/dL      Sodium 130 mEq/L      Potassium 4.2 mEq/L      Chloride 105 mEq/L      CO2 26 mEq/L      Calcium 9.1 mg/dL      Protein, Total 6.5 g/dL      Albumin 3.7 g/dL      AST (SGOT) 18 U/L      ALT 13 U/L      Alkaline Phosphatase 56 U/L      Bilirubin, Total 0.3 mg/dL      Globulin 2.8 g/dL      Albumin/Globulin Ratio 1.3      Anion Gap 7.0     Lipase [[id number]] Collected:  02/18/15 0007    Specimen Information:  Blood Updated:  02/18/15 0047     Lipase 30 U/L     Hemolysis index [[id number]] Collected:  02/18/15 0007     Hemolysis Index 5 Updated:  02/18/15 0047    GFR [[id number]] Collected:  02/18/15 0007     EGFR >60.0 Updated:  02/18/15 0047    CBC with differential [[id number]]  (Abnormal) Collected:  02/18/15 0007    Specimen Information:  Blood from Blood Updated:  02/18/15 0028     WBC 6.35 x10 3/uL      Hgb 11.6 (L) g/dL      Hematocrit 42.5 (L) %      Platelets 179 x10 3/uL  RBC 3.73 (L) x10 6/uL      MCV 92.0 fL      MCH 31.1 pg      MCHC 33.8 g/dL      RDW 13 %      MPV 10.2 fL      Neutrophils 44 %      Lymphocytes Automated 40 %      Monocytes 10 %      Eosinophils Automated 5 %      Basophils Automated 1 %      Immature Granulocyte 0 %      Nucleated RBC 0 /100  WBC      Neutrophils Absolute 2.78 x10 3/uL      Abs Lymph Automated 2.55 x10 3/uL      Abs Mono Automated 0.66 x10 3/uL      Abs Eos Automated 0.32 x10 3/uL      Absolute Baso Automated 0.04 x10 3/uL      Absolute Immature Granulocyte 0.01 x10 3/uL           Radiologic Studies -   Radiology Results (24 Hour)     Procedure Component Value Units Date/Time    Chest AP Portable [[id number]] Collected:  02/18/15 0037    Order Status:  Completed Updated:  02/18/15 0043    Narrative:      PORTABLE CHEST    CLINICAL STATEMENT: upper abdominal pain nausea vomiting    COMPARISON: Chest radiograph dated 02/04/2015 showing no acute pulmonary  or pleural disease.    FINDINGS: The lungs are clear. Grossly stable-appearing bilateral hilar  silhouettes. The cardiomediastinal silhouette is stable. No radiographic  evidence of subdiaphragmatic free air on this portable AP sitting  radiograph of the chest including the upper abdomen.         Impression:        No acute cardiopulmonary disease.    [name], MD   02/18/2015 12:39 AM        .      Medical Decision Making   I am the first provider for this patient.    I reviewed the vital signs, available nursing notes, past medical history, past surgical history, family history and social history.    Vital Signs-Reviewed the patient's vital signs.     Patient Vitals for the past 12 hrs:   BP Temp Pulse Resp   02/18/15 0130 140/83 mmHg 98.5 F (36.9 C) (!) 58 15   02/17/15 2322 137/81 mmHg 98.1 F (36.7 C) 75 20       Pulse Oximetry Analysis - Normal 99% on RA    EKG:  Interpreted by the EP.   Time Interpreted: 61   Rate: 67   Rhythm: Normal Sinus Rhythm   Interpretation: Normal ST segments and intervals.   Comparison: No significant changes from Feb 04, 2015    Old Medical Records: Old medical records.  Previous electrocardiograms.  Nursing notes.  Previous radiology studies.     Previous ED visits for chest pain Feb 04, 2015 and Sep 2016.    ED Course:   0104: Patient  sleeping. When woken up, he states he is in pain. Counseled patient on results. He requests food and something to drink.  0128: Nausea is improved. Discussed nausea and pain control, follow up with discharge clinic and GI, and return precautions. He agrees with plan. Rectal exam performed and heme positive, no gross blood. No external hemorrhoids appreciated. Advised f/u with GI given concern for acute gastritis  with bleeding. Nexium rx written.    Smoking Cessation Counseling: The patient was counseled as to the multiple risks to his health from continued use of tobacco products.  It was explained that continuing to smoke may lead to multiple short and long term negative health consequences, including but not limited to mouth/esophageal/lung cancer, COPD, and heart disease.  He states he understands these risks, and also understands the options and resources available to him to help him stop smoking.  Nicotine replacement therapy, local hotlines, and local resources were discussed as viable options for helping him stop his tobacco use.  The total time spent counseling the patient regarding tobacco cessation was 5 minutes.      Provider Notes:     Diagnosis     Clinical Impression:   1. Upper abdominal pain    2. Nausea and vomiting in adult    3. Mild anemia    4. Other acute gastritis, presence of bleeding unspecified        Treatment Plan:   ED Disposition     Discharge Windle Huebert Haigler discharge to home/self care.    Condition at disposition: Stable          _______________________________      Attestations: This note is prepared by Cecile Hearing, acting as scribe for Chrissie Noa, MD. The scribe's documentation has been prepared under my direction and personally reviewed by me in its entirety.  I confirm that the note above accurately reflects all work, treatment, procedures, and medical decision making performed by me.    Lorenza Cambridge, MD  02/18/15 [phone]

## 2015-02-18 LAB — CBC AND DIFFERENTIAL
Basophils Absolute Automated: 0.04 10*3/uL (ref 0.00–0.20)
Basophils Automated: 1 %
Eosinophils Absolute Automated: 0.32 10*3/uL (ref 0.00–0.70)
Eosinophils Automated: 5 %
Hematocrit: 34.3 % — ABNORMAL LOW (ref 42.0–52.0)
Hgb: 11.6 g/dL — ABNORMAL LOW (ref 13.0–17.0)
Immature Granulocytes Absolute: 0.01 10*3/uL
Immature Granulocytes: 0 %
Lymphocytes Absolute Automated: 2.55 10*3/uL (ref 0.50–4.40)
Lymphocytes Automated: 40 %
MCH: 31.1 pg (ref 28.0–32.0)
MCHC: 33.8 g/dL (ref 32.0–36.0)
MCV: 92 fL (ref 80.0–100.0)
MPV: 10.2 fL (ref 9.4–12.3)
Monocytes Absolute Automated: 0.66 10*3/uL (ref 0.00–1.20)
Monocytes: 10 %
Neutrophils Absolute: 2.78 10*3/uL (ref 1.80–8.10)
Neutrophils: 44 %
Nucleated RBC: 0 /100 WBC (ref 0–1)
Platelets: 179 10*3/uL (ref 140–400)
RBC: 3.73 10*6/uL — ABNORMAL LOW (ref 4.70–6.00)
RDW: 13 % (ref 12–15)
WBC: 6.35 10*3/uL (ref 3.50–10.80)

## 2015-02-18 LAB — COMPREHENSIVE METABOLIC PANEL
ALT: 13 U/L (ref 0–55)
AST (SGOT): 18 U/L (ref 5–34)
Albumin/Globulin Ratio: 1.3 (ref 0.9–2.2)
Albumin: 3.7 g/dL (ref 3.5–5.0)
Alkaline Phosphatase: 56 U/L (ref 38–106)
Anion Gap: 7 (ref 5.0–15.0)
BUN: 16 mg/dL (ref 9.0–28.0)
Bilirubin, Total: 0.3 mg/dL (ref 0.2–1.2)
CO2: 26 mEq/L (ref 22–29)
Calcium: 9.1 mg/dL (ref 8.5–10.5)
Chloride: 105 mEq/L (ref 100–111)
Creatinine: 0.9 mg/dL (ref 0.7–1.3)
Globulin: 2.8 g/dL (ref 2.0–3.6)
Glucose: 92 mg/dL (ref 70–100)
Potassium: 4.2 mEq/L (ref 3.5–5.1)
Protein, Total: 6.5 g/dL (ref 6.0–8.3)
Sodium: 138 mEq/L (ref 136–145)

## 2015-02-18 LAB — GFR: EGFR: >60

## 2015-02-18 LAB — TROPONIN I: Troponin I: <0.01 ng/mL (ref 0.00–0.09)

## 2015-02-18 LAB — HEMOLYSIS INDEX: Hemolysis Index: 5 (ref 0–18)

## 2015-02-18 LAB — LIPASE: Lipase: 30 U/L (ref 8–78)

## 2015-02-18 MED ORDER — TRAMADOL HCL 50 MG PO TABS
50.0000 mg | ORAL_TABLET | Freq: Four times a day (QID) | ORAL | Status: DC | PRN
Start: 2015-02-18 — End: 2015-08-07

## 2015-02-18 MED ORDER — ESOMEPRAZOLE MAGNESIUM 20 MG PO CPDR
20.0000 mg | DELAYED_RELEASE_CAPSULE | Freq: Every morning | ORAL | Status: DC
Start: 2015-02-18 — End: 2015-08-07

## 2015-02-18 MED ORDER — FAMOTIDINE 10 MG/ML IV SOLN (WRAP)
20.0000 mg | Freq: Once | INTRAVENOUS | Status: AC
Start: 2015-02-18 — End: 2015-02-18
  Administered 2015-02-18: 20 mg via INTRAVENOUS
  Filled 2015-02-18: qty 2

## 2015-02-18 MED ORDER — ALUM & MAG HYDROXIDE-SIMETH 200-200-20 MG/5ML PO SUSP
30.0000 mL | Freq: Once | ORAL | Status: AC
Start: 2015-02-18 — End: 2015-02-18
  Administered 2015-02-18: 30 mL via ORAL
  Filled 2015-02-18: qty 30

## 2015-02-18 MED ORDER — LIDOCAINE VISCOUS 2 % MT SOLN
10.0000 mL | Freq: Once | OROMUCOSAL | Status: AC
Start: 2015-02-18 — End: 2015-02-18
  Administered 2015-02-18: 10 mL via OROMUCOSAL
  Filled 2015-02-18: qty 15

## 2015-02-18 MED ORDER — PROMETHAZINE HCL 25 MG PO TABS
12.5000 mg | ORAL_TABLET | Freq: Four times a day (QID) | ORAL | Status: DC | PRN
Start: 2015-02-18 — End: 2015-08-20

## 2015-02-18 NOTE — Discharge Instructions (Signed)
Thank you for choosing [facility] {IAH/HPX:[id number]} for your emergency care needs.   We strive to provide EXCELLENT care to you and your family.    Sincerely,  Chrissie Noa, MD    If you do not continue to improve or your condition worsens, please contact your doctor or return immediately to the Emergency Department.    DOCTOR REFERRALS  Call [phone] [facility]) if you need any further referrals and we can help you find a primary care doctor.        MEDICAL RECORDS AND TESTS  Certain laboratory test results do not come back the same day, for example urine cultures. We will contact you if other important findings are noted or you can contact us by calling the Emergency Department if were told any results were still pending at time of discharge.     RADIOLOGY  X-rays and other radiologic studies may be reviewed again the next morning. If we find a discrepancy, we will notify you. Please know that significant injuries can exist even when an initial x-ray is read as normal or negative.  This can occur because some fractures (broken bones) are not initially visible on x-rays.  For this reason, close outpatient follow-up with your primary care doctor or bone specialist (orthopedist) is required.    MEDICATIONS AND FOLLOWUP  Please be aware that some prescription medications can cause drowsiness.  Use caution when driving or operating machinery.    BLOOD PRESSURE  If at any time during your visit, the measured blood pressure was greater than 120 systolic (top number) or 80 diastolic (bottom number), please see you primary care physician in 1-2 days.     The examination and treatment you have received in our Emergency Department is provided on an emergency basis, and is not intended to be a substitute for your primary care physician.  It is important that your doctor re-evaluates you and that you report any new or remaining problems at that time.        Abdominal Pain    You have been diagnosed with abdominal  (belly) pain. The cause of your pain is not yet known.    Many things can cause abdominal pain. Examples include viral infections and bowel (intestine) spasms. You might need another examination or more tests to find out why you have pain.    At this time, your pain does not seem to be caused by anything dangerous. You do not need surgery. You do not need to stay in the hospital.     Though we don't believe your condition is dangerous right now, it is important to be careful. Sometimes a problem that seems mild can become serious later. This is why it is very important that you return here or go to the nearest Emergency Department unless you are 100% improved.    YOU SHOULD SEEK MEDICAL ATTENTION IMMEDIATELY, EITHER HERE OR AT THE NEAREST EMERGENCY DEPARTMENT, IF ANY OF THE FOLLOWING OCCURS:   Your pain does not go away or gets worse.   You cannot keep fluids down or your vomit is dark green.    You vomit blood or see blood in your stool. Blood might be bright red or dark red. It can also be black and look like tar.   You have a fever (temperature higher than 100.66F / 38C) or shaking chills.   Your skin or eyes look yellow or your urine looks brown.   You have severe diarrhea.  Vomiting    You have been seen for vomiting.    Vomiting (throwing-up) can be caused by many different things. Most of the time the cause IS NOT serious. The doctor feels it is OK for you to go home today.    Common causes of vomiting include the following:   Gastroenteritis (stomach flu), usually with diarrhea.   Other illnesses. Sometimes medical conditions like diabetes, heart problems, headaches, or infections can make someone throw up.    Bowel obstructions (blockages) can cause vomiting and make patients unable to have bowel movements (stool) or pass gas.   Vomiting can be a symptom of appendicitis, especially if there is also pain in the right lower abdomen (belly).    Sometimes it is hard to find out what is  causing the vomiting. Vomiting can be treated with anti-nausea medicines like promethazine (Phenergan), prochlorperazine (Compazine) or ondansetron (Zofran).    Try to drink liquids to avoid dehydration. Don't drink a lot of fluid all at once. Take small sips throughout the day.    YOU SHOULD SEEK MEDICAL ATTENTION IMMEDIATELY, EITHER HERE OR AT THE NEAREST EMERGENCY DEPARTMENT, IF ANY OF THE FOLLOWING OCCURS:   You can't stop vomiting or your vomiting doesn't get better with medication.   You cannot keep liquids down.   You have severe sudden chest or belly pain after vomiting.   You have abdominal pain.              Anemia    You have been diagnosed with anemia.    Anemia means "a low red blood cell count." Red blood cells are a part of your blood. These carry oxygen. Blood also has white blood cells, which fight infection and platelets, which help blood to clot.    Symptoms of anemia include fatigue (feeling tired) and weakness. Symptoms also include shortness of breath or chest pain with exercise or even normal activity. Another sign is pale color of the skin, lips and fingernail beds.    Anemia can have many causes. These include:   Ongoing (continual) blood loss. Sometimes there can be a slow "leak" of blood into the bowels. It can also happen with menstruation (menstrual period). Over time, the blood loss adds up. Then your blood count can get too low.   Iron deficiency: Iron is needed to make new red blood cells. Sometimes iron intake is too low for the body s needs.   Vitamin deficiency: The body needs vitamin B12 and folic acid to make new red blood cells. If intake of these vitamins is too low from poor nutrition or too much alcohol, you can become anemic.   Chronic diseases: Some medical illnesses cause low blood count. This is especially the case for those with generalized inflammation.   Kidney disease: Patients with long-term kidney problems can get anemia.   Blood breakdown: Some  diseases cause the blood cells in the blood stream to be destroyed or broken down. This can cause a low blood count.    The exact cause of your anemia is not known at this time. You may have had tests to see why you are anemic. You can get the results soon. See your primary care doctor or the referral doctor for more evaluation.    After an evaluation, the doctor thinks your blood count IS NOT so low that you need a blood transfusion. Follow-up with your regular doctor for more rechecks on your blood count.    YOU SHOULD SEEK MEDICAL ATTENTION  IMMEDIATELY, EITHER HERE OR AT THE NEAREST EMERGENCY DEPARTMENT, IF ANY OF THE FOLLOWING OCCURS:   You get light-headed and dizzy as if about to faint.   You get worsening shortness of breath during regular activities like walking or climbing stairs.   You get chest pain during regular activities like walking or climbing stairs.   IF YOU WERE BLEEDING.Marland KitchenMarland KitchenIf bleeding gets worse.              Gastritis    You have been diagnosed with gastritis.    Gastritis is an irritation of the stomach lining. It has a number of causes including the use of aspirin and other anti-inflammatory medicines like ibuprofen (Advil or Motrin), naproxen (Naprosyn or Aleve), etc. Other causes are infections and too much stomach acid. Drinking alcohol can also cause gastritis.    You may be prescribed medicines to lower the amount of acid in the stomach or otherwise protect the stomach lining.    Use an antacid (Maalox and Mylanta) as directed on the bottle (1-2 tablespoons or 5-10 ml four times a day).    Avoid caffeine and alcohol. Avoid aspirin and other anti-inflammatory medicines like ibuprofen (Advil or Motrin), naproxen (Naprosyn or Aleve), etc. Acetaminophen (Tylenol) is safe for your stomach.    Spicy foods and acidic foods may increase pain. However, they do not damage the stomach lining. Avoid these foods if they cause pain.    You may be referred to a stomach  specialist Psychologist, sport and exercise) for further evaluation.    YOU SHOULD SEEK MEDICAL ATTENTION IMMEDIATELY, EITHER HERE OR AT THE NEAREST EMERGENCY DEPARTMENT, IF ANY OF THE FOLLOWING OCCURS:   Your pain suddenly gets worse.   You vomit (throw up) repeatedly or vomit blood or material that looks like "coffee grounds."   Any blood is in your stool or your stool gets very dark or looks like tar.   You develop a fever (temperature higher than 100.93F / 38C) or shaking chills.

## 2015-02-18 NOTE — ED Notes (Signed)
Pt c/o lower abd pain, constipation and vomiting x 2 tonight. States he feels "like he needs to belch but is unable to belch". Denies Etoh use. Denies diarrhea,. C/o chest pain as well but states it is more abdominal pain.

## 2015-02-19 LAB — ECG 12-LEAD
Atrial Rate: 67 {beats}/min
P Axis: 57 degrees
P-R Interval: 150 ms
Q-T Interval: 388 ms
QRS Duration: 88 ms
QTC Calculation (Bezet): 409 ms
R Axis: -29 degrees
T Axis: -11 degrees
Ventricular Rate: 67 {beats}/min

## 2015-02-23 ENCOUNTER — Telehealth (INDEPENDENT_AMBULATORY_CARE_PROVIDER_SITE_OTHER): Payer: Self-pay

## 2015-02-23 NOTE — Telephone Encounter (Signed)
Called patient to schedule a F/U appointment in our [facility] clinic, left a voicemail message as patient did not answer to please call us back to schedule.

## 2015-03-24 ENCOUNTER — Emergency Department: Payer: Medicaid Other

## 2015-03-24 ENCOUNTER — Emergency Department
Admission: EM | Admit: 2015-03-24 | Discharge: 2015-03-24 | Disposition: A | Discharge status: Home / Self Care | Payer: Medicaid Other | Attending: Emergency Medicine | Admitting: Emergency Medicine

## 2015-03-24 DIAGNOSIS — R079 Chest pain, unspecified: Secondary | ICD-10-CM | POA: Insufficient documentation

## 2015-03-24 DIAGNOSIS — F17210 Nicotine dependence, cigarettes, uncomplicated: Secondary | ICD-10-CM | POA: Insufficient documentation

## 2015-03-24 LAB — ECG 12-LEAD
Atrial Rate: 49 {beats}/min
P Axis: 30 degrees
P-R Interval: 140 ms
Q-T Interval: 420 ms
QRS Duration: 90 ms
QTC Calculation (Bezet): 379 ms
R Axis: 3 degrees
T Axis: -5 degrees
Ventricular Rate: 49 {beats}/min

## 2015-03-24 LAB — GFR: EGFR: >60

## 2015-03-24 LAB — COMPREHENSIVE METABOLIC PANEL
ALT: 13 U/L (ref 0–55)
AST (SGOT): 22 U/L (ref 5–34)
Albumin/Globulin Ratio: 1.4 (ref 0.9–2.2)
Albumin: 3.7 g/dL (ref 3.5–5.0)
Alkaline Phosphatase: 57 U/L (ref 38–106)
Anion Gap: 7 (ref 5.0–15.0)
BUN: 10 mg/dL (ref 9.0–28.0)
Bilirubin, Total: 0.7 mg/dL (ref 0.2–1.2)
CO2: 26 mEq/L (ref 22–29)
Calcium: 8.9 mg/dL (ref 8.5–10.5)
Chloride: 102 mEq/L (ref 100–111)
Creatinine: 0.8 mg/dL (ref 0.7–1.3)
Globulin: 2.6 g/dL (ref 2.0–3.6)
Glucose: 103 mg/dL — ABNORMAL HIGH (ref 70–100)
Potassium: 4.5 mEq/L (ref 3.5–5.1)
Protein, Total: 6.3 g/dL (ref 6.0–8.3)
Sodium: 135 mEq/L — ABNORMAL LOW (ref 136–145)

## 2015-03-24 LAB — CBC AND DIFFERENTIAL
Basophils Absolute Automated: 0.02 10*3/uL (ref 0.00–0.20)
Basophils Automated: 0 %
Eosinophils Absolute Automated: 0.37 10*3/uL (ref 0.00–0.70)
Eosinophils Automated: 5 %
Hematocrit: 32.1 % — ABNORMAL LOW (ref 42.0–52.0)
Hgb: 10.8 g/dL — ABNORMAL LOW (ref 13.0–17.0)
Immature Granulocytes Absolute: 0.01 10*3/uL
Immature Granulocytes: 0 %
Lymphocytes Absolute Automated: 1.94 10*3/uL (ref 0.50–4.40)
Lymphocytes Automated: 25 %
MCH: 31 pg (ref 28.0–32.0)
MCHC: 33.6 g/dL (ref 32.0–36.0)
MCV: 92.2 fL (ref 80.0–100.0)
MPV: 10.1 fL (ref 9.4–12.3)
Monocytes Absolute Automated: 0.76 10*3/uL (ref 0.00–1.20)
Monocytes: 10 %
Neutrophils Absolute: 4.76 10*3/uL (ref 1.80–8.10)
Neutrophils: 61 %
Nucleated RBC: 0 /100 WBC (ref 0–1)
Platelets: 183 10*3/uL (ref 140–400)
RBC: 3.48 10*6/uL — ABNORMAL LOW (ref 4.70–6.00)
RDW: 13 % (ref 12–15)
WBC: 7.85 10*3/uL (ref 3.50–10.80)

## 2015-03-24 LAB — TROPONIN I
Troponin I: <0.01 ng/mL (ref 0.00–0.09)
Troponin I: <0.01 ng/mL (ref 0.00–0.09)

## 2015-03-24 LAB — HEMOLYSIS INDEX: Hemolysis Index: 37 — ABNORMAL HIGH (ref 0–18)

## 2015-03-24 LAB — LIPASE: Lipase: 16 U/L (ref 8–78)

## 2015-03-24 MED ORDER — FENTANYL CITRATE (PF) 50 MCG/ML IJ SOLN (WRAP)
25.0000 ug | Freq: Once | INTRAMUSCULAR | Status: AC
Start: 2015-03-24 — End: 2015-03-24
  Administered 2015-03-24: 25 ug via INTRAVENOUS
  Filled 2015-03-24: qty 2

## 2015-03-24 MED ORDER — NITROGLYCERIN 0.4 MG SL SUBL
0.4000 mg | SUBLINGUAL_TABLET | SUBLINGUAL | Status: DC
Start: 2015-03-24 — End: 2015-03-24

## 2015-03-24 MED ORDER — NITROGLYCERIN 0.4 MG SL SUBL
0.4000 mg | SUBLINGUAL_TABLET | SUBLINGUAL | Status: AC
Start: 2015-03-24 — End: 2015-03-24
  Administered 2015-03-24 (×3): 0.4 mg via SUBLINGUAL
  Filled 2015-03-24: qty 1

## 2015-03-24 NOTE — ED Notes (Addendum)
[name] [name] is a 50 y.o. male who presents to ED with c/o right sided chest pain that began approximately PTA. Pt reports difficulty breathing, stating "it hurts and feels like I can't catch my breath". Pt speaking easily in full sentences. Reports some nausea but denies vomiting. Pt reports increased feelings of fatigue and pain to the right side of his head, describing it as "restricted, tight". Reports taking 2-3 tramadol today with limited relief. BP 131/75 mmHg  Pulse 61  Temp(Src) 97.8 F (36.6 C)  Resp 20  Ht 5\' 8"  (1.727 m)  Wt 77.111 kg  BMI 25.85 kg/m2  SpO2 97%

## 2015-03-24 NOTE — ED Provider Notes (Signed)
EMERGENCY DEPARTMENT HISTORY AND PHYSICAL EXAM     Physician/Midlevel provider first contact with patient: 03/24/15 1247         Date: 03/24/2015  Patient Name: [name] [name]    History of Presenting Illness     Chief Complaint   Patient presents with   . Chest Pain       History Provided By: patient     Chief Complaint: chest pain  Onset: 30 mins prior to arrival   Timing: persistent   Location: right of sternum    Quality: ache   Severity: 7/10   Exacerbating factors: worse with palpation  Alleviating factors: none   Associated Symptoms: nasal congestion, fatigue, right sided headache   Pertinent Negatives: fevers, SOB, nausea, abdominal pain, back pain, neck pain/stiffness, trauma    Additional History: [name] [name] is a 50 y.o. male with hx of heart murmur presenting to the ED with 30 mins of constant chest pain.  Pt states this pain is located to the right sided of sternum and rates it as a 7/10 ache. Pt reports associated nasal congestion, fatigue and a gradual onset right sided headache around the same time. States he gets similar headaches chronically and that it is not the worst headache of his life. Pt denies fevers, shortness of breath, nausea or neck pain. No hx of premature cardiac hx in family. No history of PE or DVT. No leg pain or swelling. No cough, SOB, hemoptysis, or lightheadedness. No recent travel, surgery. No history of blood clotting disorder.  Pt works out regularly and denies exertional chest pain. He had negative stress test July 2013. 2013 admission note states that pt has history of cocaine and narcotic abuse. Pt states that he has not used illicit drugs "for years". He admits to smoking "less than 5" cigarettes per day.     PCP: Pcp, Noneorunknown, MD  SPECIALISTS:    No current facility-administered medications for this encounter.     Current Outpatient Prescriptions   Medication Sig Dispense Refill   . esomeprazole (NEXIUM) 20 MG capsule Take 1 capsule (20 mg total) by mouth every  morning before breakfast. 30 capsule 0   . promethazine (PHENERGAN) 25 MG tablet Take 0.5-1 tablets (12.5-25 mg total) by mouth every 6 (six) hours as needed for Nausea. 12 tablet 0   . traMADol (ULTRAM) 50 MG tablet Take 1 tablet (50 mg total) by mouth every 6 (six) hours as needed. Do not drive or operate machinery while taking this medication 10 tablet 0       Past History     Past Medical History:  Past Medical History   Diagnosis Date   . Heart murmur    . Gallstones    . Gastritis    . Renal calculus        Past Surgical History:  History reviewed. No pertinent past surgical history.    Family History:  Family History   Problem Relation Age of Onset   . Diabetes Mother    . Hypertension Mother        Social History:  Social History   Substance Use Topics   . Smoking status: Current Some Day Smoker -- 0.50 packs/day for .5 years     Types: Cigarettes   . Smokeless tobacco: None   . Alcohol Use: No      Comment: pt claims he "is not a drinker", admits to ETOH use tonight       Allergies:  Allergies  Allergen Reactions   . Gluten      Pt states "white bread"   . Percocet [Oxycodone-Acetaminophen]    . Tylenol [Acetaminophen] Hives     Tyelnol#3, hives and throw up       Review of Systems       Review of Systems   Constitutional: Negative for fever and +fatigue.   HENT: Negative for rhinorrhea and sore throat +nasal congestion   Eyes: Negative for discharge, redness and visual disturbance.   Respiratory: Negative for cough and shortness of breath.   Cardiovascular: + chest pain and negative leg swelling.   Gastrointestinal: Negative for nausea and abdominal pain.   Endocrine: Negative for polyuria.   Genitourinary: Negative for dysuria, urgency, frequency and flank pain.   Musculoskeletal: Negative for back pain and neck pain.   Skin: Negative for rash.   Allergic/Immunologic: Negative for immunocompromised state.   Neurological: Negative for light-headedness and +headaches. No focal numbness or  weakness  Hematological: Does not bruise/bleed easily.   Psychiatric/Behavioral: Negative for suicidal ideas.     Physical Exam   BP 127/62 mmHg  Pulse 59  Temp(Src) 98.3 F (36.8 C) (Oral)  Resp 16  Ht 5\' 8"  (1.727 m)  Wt 77.111 kg  BMI 25.85 kg/m2  SpO2 100%    Constitutional: Vital signs reviewed. Well appearing. No distress.  Head: Normocephalic, atraumatic  Eyes: Conjunctiva and sclera are normal.  No injection or discharge.  Ears, Nose, Throat:  Normal external examination of the nose and ears.  Mucous membranes moist.  Neck: Normal range of motion. Supple, no meningeal signs. No midline C-spine tenderness. Trachea midline. No carotid bruit.   Respiratory/Chest: Clear to auscultation. No respiratory distress. (+) R parasternal tenderness to palpation. Palpation of this area exactly reproduces chest pain.   Cardiovascular: Regular rate and rhythm. No murmurs.  Abdomen:  Bowel sounds intact. No rebound or guarding. Soft.  Non-tender.  Back: no cva tenderness to percussion.  Upper Extremity:  No edema. No cyanosis. Bilateral radial pulses intact and equal.   Lower Extremity:  No edema. No cyanosis. Bilateral calves symmetrical and non-tender. Bilateral femoral, DP, PT pulses intact and equal.   Skin: Warm and dry. No rash.  Neuro: CNII -XII intact to testing. Strength 5/5 and symmetrical in the bilateral upper and lower extremities. Sensation to sharp touch intact and equal in the bilateral upper and lower extremities. Coordination intact to finger to nose testing . Normal gait. No jolt accentuation of headache.   Psychiatric:  Normal affect.  Normal insight.      Diagnostic Study Results     Labs -     Results     Procedure Component Value Units Date/Time    Troponin I [[id number]] Collected:  03/24/15 1532    Specimen Information:  Blood Updated:  03/24/15 1602     Troponin I <0.01 ng/mL     Troponin I [[id number]] Collected:  03/24/15 1307    Specimen Information:  Blood Updated:  03/24/15 1339      Troponin I <0.01 ng/mL     Lipase [[id number]] Collected:  03/24/15 1307    Specimen Information:  Blood Updated:  03/24/15 1333     Lipase 16 U/L     Hemolysis index [[id number]]  (Abnormal) Collected:  03/24/15 1307     Hemolysis Index 37 (H) Updated:  03/24/15 1333    GFR [[id number]] Collected:  03/24/15 1307     EGFR >60.0 Updated:  03/24/15 1333    Comprehensive Metabolic  Panel (CMP) [[id number]]  (Abnormal) Collected:  03/24/15 1307    Specimen Information:  Blood Updated:  03/24/15 1333     Glucose 103 (H) mg/dL      BUN 69.6 mg/dL      Creatinine 0.8 mg/dL      Sodium 295 (L) mEq/L      Potassium 4.5 mEq/L      Chloride 102 mEq/L      CO2 26 mEq/L      Calcium 8.9 mg/dL      Protein, Total 6.3 g/dL      Albumin 3.7 g/dL      AST (SGOT) 22 U/L      ALT 13 U/L      Alkaline Phosphatase 57 U/L      Bilirubin, Total 0.7 mg/dL      Globulin 2.6 g/dL      Albumin/Globulin Ratio 1.4      Anion Gap 7.0     CBC and differential [[id number]]  (Abnormal) Collected:  03/24/15 1307    Specimen Information:  Blood from Blood Updated:  03/24/15 1322     WBC 7.85 x10 3/uL      Hgb 10.8 (L) g/dL      Hematocrit 10.2 (L) %      Platelets 183 x10 3/uL      RBC 3.48 (L) x10 6/uL      MCV 92.2 fL      MCH 31.0 pg      MCHC 33.6 g/dL      RDW 13 %      MPV 10.1 fL      Neutrophils 61 %      Lymphocytes Automated 25 %      Monocytes 10 %      Eosinophils Automated 5 %      Basophils Automated 0 %      Immature Granulocyte 0 %      Nucleated RBC 0 /100 WBC      Neutrophils Absolute 4.76 x10 3/uL      Abs Lymph Automated 1.94 x10 3/uL      Abs Mono Automated 0.76 x10 3/uL      Abs Eos Automated 0.37 x10 3/uL      Absolute Baso Automated 0.02 x10 3/uL      Absolute Immature Granulocyte 0.01 x10 3/uL           Radiologic Studies -   Radiology Results (24 Hour)     Procedure Component Value Units Date/Time    Chest AP Portable [[id number]] Collected:  03/24/15 1400    Order Status:  Completed Updated:  03/24/15 1404    Narrative:      XR  CHEST AP PORTABLE    CLINICAL INDICATION:   cp    COMPARISON: 02/17/2015    FINDINGS: The cardiomediastinal silhouette appears within normal size  limits for portable technique and patient positioning. There is no  evidence for focal airspace consolidation, pleural effusion, or  pneumothorax. The pulmonary vascularity appears unremarkable.      Impression:       No acute pulmonary or pleural disease.    Sandie Ano, MD   03/24/2015 2:00 PM      CT Head without Contrast [[id number]] Collected:  03/24/15 1333    Order Status:  Completed Updated:  03/24/15 1345    Narrative:      CT HEAD WO CONTRAST:03/24/2015 1:29 PM     CLINICAL HISTORY:    .frontal headache since yesterday non-focal  neuro    TECHNIQUE: Noncontrast Head CT    CONTRAST: None    COMPARISON: None.    FINDINGS:     Extra axial spaces: Normal in size and morphology for the patient's age.  Hemorrhage: None.  Ventricular system: Normal in size and morphology for the patient's age.  Basal cisterns: Normal.  Cerebral parenchyma: Normal.  Midline shift: None.  Cerebellum: Normal.  Brainstem: Normal.    OTHER:    Calvarium: Normal.  Vascular system: Normal.  Visualized Paranasal sinuses: Scattered mild inflammatory mucosal  thickening in the ethmoid cell.  Visualized Orbits: Normal.  Visualized upper cervical spine: Normal.  Sella and skull base: Normal.      Impression:          Unremarkable study    Laurena Slimmer, MD   03/24/2015 1:41 PM        .    Medical Decision Making   I am the first provider for this patient.    I reviewed the vital signs, available nursing notes, past medical history, past surgical history, family history and social history.    Vital Signs-Reviewed the patient's vital signs.     Patient Vitals for the past 12 hrs:   BP Temp Pulse Resp   03/24/15 1630 127/62 mmHg 98.3 F (36.8 C) (!) 59 16   03/24/15 1408 120/79 mmHg - (!) 54 18   03/24/15 1405 117/77 mmHg - (!) 50 18   03/24/15 1400 135/77 mmHg - (!) 55 18   03/24/15 1355 135/77 mmHg -  (!) 50 18   03/24/15 1232 131/75 mmHg 97.8 F (36.6 C) 61 20       Pulse Oximetry Analysis - Normal 97% on ra     Cardiac Monitor:  Rate: 50  Rhythm:  Sinus bradycardia     EKG:  Interpreted by the EP.   Time Interpreted: 1238   Rate: 49   Rhythm: sinus bradycardia    Interpretation no ST elevation    Comparison: 02/17/15, there is new sinus bradycardia       Old Medical Records: Old medical records.  Nursing notes.   Hx of narcotic abuse and cocaine abuse.  Old EKG  Old imaging.  Negative stress test in 2013     ED Course:   2:00 PM - Pt in NAD. Agrees with staying for repeat troponin.     4:35 PM - repeat trop negative. Pt in NAD. Pt given PCP and cardio f/u. Informed pt of results, discussed treatment plan, return precautions, and follow up plan (cardiology and primary care referrals provided). Counseled on smoking cessation. Pt wants to take "one thing at a time," declines medication to stop smoking. Pt feels comfortable with discharge and  agreeable with plan.      Provider Notes: Pt with history of tobacco abuse and polysubstance abuse in remission presenting to ED with R sided chest pain. Chest pain appears to be musculoskeletal in nature as it is exactly reproduced by palpation of the R parasternal area. HEART score 3. Doubt ACS given duration of symptoms with serial negative troponin and physical exam findings. Low suspicion for aortic dissection based on equal bilateral radial pulses, no mediastinal widening on CXR, physical exam, and patient's description of symptoms. No pulsatile abdominal mass or abdominal tenderness to suggest AAA. Wells score zero, PERC score one (for age > 55), low suspicion for PE given his physical exam and overall clinical context. Doubt pericarditis or endocarditis. No JVD or cardiac enlargement on  CXR to suggest tamponade. With regard to headache, it appears to be chronic and mild. Low suspicion for meningitis based on lack of meningeal signs or SIRS criteria. Low suspicion for  [facility] given chronic nature of headache, description of symptoms, no thunderclap onset, and negative CT scan of head within 6 hours of headache onset. No visual changes or eye pain to suggest angle closure glaucoma or temporal arteritis.     Core measures: counseled patient for 5 minutes regarding importance of smoking cessation and offered resources to help quit smoking        Diagnosis     Clinical Impression:   1. Chest pain, unspecified type        Treatment Plan:   ED Disposition     Discharge Clarance Bollard Noorani discharge to home/self care.    Condition at disposition: Stable              _______________________________      Attestations: This note is prepared by Wayland Denis, acting as scribe for Lynnea Ferrier, MD. The scribe's documentation has been prepared under my direction and personally reviewed by me in its entirety.  I confirm that the note above accurately reflects all work, treatment, procedures, and medical decision making performed by me.    _______________________________      Maryella Shivers, MD  03/26/15 1258

## 2015-08-03 ENCOUNTER — Emergency Department
Admission: EM | Admit: 2015-08-03 | Discharge: 2015-08-03 | Disposition: A | Discharge status: Home / Self Care | Payer: Self-pay | Attending: Emergency Medicine | Admitting: Emergency Medicine

## 2015-08-03 ENCOUNTER — Emergency Department: Payer: Self-pay

## 2015-08-03 ENCOUNTER — Emergency Department: Payer: Medicaid Other

## 2015-08-03 DIAGNOSIS — F17210 Nicotine dependence, cigarettes, uncomplicated: Secondary | ICD-10-CM | POA: Insufficient documentation

## 2015-08-03 DIAGNOSIS — R0602 Shortness of breath: Secondary | ICD-10-CM | POA: Insufficient documentation

## 2015-08-03 LAB — COMPREHENSIVE METABOLIC PANEL
ALT: 14 U/L (ref 0–55)
AST (SGOT): 20 U/L (ref 5–34)
Albumin/Globulin Ratio: 1.2 (ref 0.9–2.2)
Albumin: 3.5 g/dL (ref 3.5–5.0)
Alkaline Phosphatase: 59 U/L (ref 38–106)
Anion Gap: 2 — ABNORMAL LOW (ref 5.0–15.0)
BUN: 12 mg/dL (ref 9.0–28.0)
Bilirubin, Total: 0.6 mg/dL (ref 0.2–1.2)
CO2: 31 mEq/L — ABNORMAL HIGH (ref 22–29)
Calcium: 9.1 mg/dL (ref 8.5–10.5)
Chloride: 102 mEq/L (ref 100–111)
Creatinine: 0.9 mg/dL (ref 0.7–1.3)
Globulin: 2.9 g/dL (ref 2.0–3.6)
Glucose: 110 mg/dL — ABNORMAL HIGH (ref 70–100)
Potassium: 4.1 mEq/L (ref 3.5–5.1)
Protein, Total: 6.4 g/dL (ref 6.0–8.3)
Sodium: 135 mEq/L — ABNORMAL LOW (ref 136–145)

## 2015-08-03 LAB — CBC AND DIFFERENTIAL
Basophils Absolute Automated: 0.03 10*3/uL (ref 0.00–0.20)
Basophils Automated: 0.4 %
Eosinophils Absolute Automated: 0.47 10*3/uL (ref 0.00–0.70)
Eosinophils Automated: 6.7 %
Hematocrit: 31.4 % — ABNORMAL LOW (ref 42.0–52.0)
Hgb: 10.7 g/dL — ABNORMAL LOW (ref 13.0–17.0)
Immature Granulocytes Absolute: 0.02 10*3/uL
Immature Granulocytes: 0.3 %
Lymphocytes Absolute Automated: 2.26 10*3/uL (ref 0.50–4.40)
Lymphocytes Automated: 32 %
MCH: 31.1 pg (ref 28.0–32.0)
MCHC: 34.1 g/dL (ref 32.0–36.0)
MCV: 91.3 fL (ref 80.0–100.0)
MPV: 10.4 fL (ref 9.4–12.3)
Monocytes Absolute Automated: 0.8 10*3/uL (ref 0.00–1.20)
Monocytes: 11.3 %
Neutrophils Absolute: 3.5 10*3/uL (ref 1.80–8.10)
Neutrophils: 49.6 %
Nucleated RBC: 0 /100 WBC (ref 0.0–1.0)
Platelets: 162 10*3/uL (ref 140–400)
RBC: 3.44 10*6/uL — ABNORMAL LOW (ref 4.70–6.00)
RDW: 13 % (ref 12–15)
WBC: 7.06 10*3/uL (ref 3.50–10.80)

## 2015-08-03 LAB — GFR: EGFR: >60

## 2015-08-03 LAB — TROPONIN I: Troponin I: <0.01 ng/mL (ref 0.00–0.09)

## 2015-08-03 LAB — HEMOLYSIS INDEX: Hemolysis Index: 7 (ref 0–18)

## 2015-08-03 LAB — TSH: TSH: 0.77 u[IU]/mL (ref 0.35–4.94)

## 2015-08-03 LAB — IHS D-DIMER: D-Dimer: <0.27 ug/mL FEU (ref 0.00–0.51)

## 2015-08-03 LAB — T4, FREE: T4 Free: 0.93 ng/dL (ref 0.70–1.48)

## 2015-08-03 NOTE — ED Provider Notes (Addendum)
EMERGENCY DEPARTMENT HISTORY AND PHYSICAL EXAM     Physician/Midlevel provider first contact with patient: 08/03/15 1632         Date: 08/03/2015  Patient Name: [name] [name]    History of Presenting Illness     Chief Complaint   Patient presents with   . Shortness of Breath       History Provided By: Pt    Chief Complaint: SOB  Onset: 2-3 days, worse in the last 2 hours   Timing: Constant  Severity: Moderate  Modifying Factors: None  Associated Symptoms: None     Additional History: [name] [name] is a 50 y.o. male c/o SOB for the last 2-3 days, worse in the last 2 hours. Pt states today he had a physical and was told to come to the ED "because of his heart rate." Pt is a current every day smoker. Denies any recent travel of long duration. He denies any other sx or complaints.     PCP: Pcp, Noneorunknown, MD      No current facility-administered medications for this encounter.     Current Outpatient Prescriptions   Medication Sig Dispense Refill   . esomeprazole (NEXIUM) 20 MG capsule Take 1 capsule (20 mg total) by mouth every morning before breakfast. 30 capsule 0   . promethazine (PHENERGAN) 25 MG tablet Take 0.5-1 tablets (12.5-25 mg total) by mouth every 6 (six) hours as needed for Nausea. 12 tablet 0   . traMADol (ULTRAM) 50 MG tablet Take 1 tablet (50 mg total) by mouth every 6 (six) hours as needed. Do not drive or operate machinery while taking this medication 10 tablet 0       Past History     Past Medical History:  Past Medical History   Diagnosis Date   . Heart murmur    . Gallstones    . Gastritis    . Renal calculus        Past Surgical History:  History reviewed. No pertinent past surgical history.    Family History:  Family History   Problem Relation Age of Onset   . Diabetes Mother    . Hypertension Mother        Social History:  Social History   Substance Use Topics   . Smoking status: Current Some Day Smoker -- 0.50 packs/day for .5 years     Types: Cigarettes   . Smokeless tobacco: None   . Alcohol  Use: No      Comment: pt claims he "is not a drinker", admits to ETOH use tonight       Allergies:  Allergies   Allergen Reactions   . Gluten      Pt states "white bread"   . Percocet [Oxycodone-Acetaminophen]    . Tylenol [Acetaminophen] Hives     Tyelnol#3, hives and throw up       Review of Systems     Review of Systems   Constitutional: Negative for fever and fatigue.   HENT: Negative for congestion and rhinorrhea.    Eyes: Negative for discharge and redness.   Respiratory: Positive for shortness of breath. Negative for chest tightness.    Cardiovascular: Negative for chest pain and palpitations.   Gastrointestinal: Negative for nausea, vomiting and abdominal pain.   Genitourinary: Negative for dysuria and hematuria.   Musculoskeletal: Negative for back pain, neck pain and neck stiffness.   Skin: Negative for color change and rash.   Neurological: Negative for weakness, numbness and headaches.  Psychiatric/Behavioral: Negative for confusion. The patient is not nervous/anxious.           Physical Exam   BP 125/62 mmHg  Pulse 70  Temp(Src) 97.7 F (36.5 C)  Resp 16  Ht 5\' 8"  (1.727 m)  Wt 75.297 kg  BMI 25.25 kg/m2  SpO2 97%    Physical Exam   Constitutional: He is oriented to person, place, and time. He appears well-developed and well-nourished. No distress.   HENT:   Head: Normocephalic and atraumatic.   Eyes: Conjunctivae are normal. No scleral icterus.   Neck: Normal range of motion. No JVD present. No tracheal deviation present.   Cardiovascular: Normal rate, regular rhythm, normal heart sounds and intact distal pulses.  Exam reveals no gallop and no friction rub.    No murmur heard.  Pulmonary/Chest: Effort normal and breath sounds normal. No respiratory distress. He has no wheezes. He has no rales. He exhibits no tenderness.   Abdominal: Soft. Bowel sounds are normal. He exhibits no distension and no mass. There is no tenderness. There is no rebound and no guarding.   Musculoskeletal: Normal range  of motion. He exhibits no tenderness.   Neurological: He is alert and oriented to person, place, and time.   Skin: Skin is warm and dry. He is not diaphoretic.   Psychiatric: He has a normal mood and affect. His behavior is normal.   Vitals reviewed.        Diagnostic Study Results     Labs -     Results     Procedure Component Value Units Date/Time    TSH [[id number]] Collected:  08/03/15 1640    Specimen Information:  Blood Updated:  08/03/15 1905     Thyroid Stimulating Hormone 0.77 uIU/mL     Free T4 [[id number]] Collected:  08/03/15 1640    Specimen Information:  Blood Updated:  08/03/15 1905     T4 Free 0.93 ng/dL     Troponin I [[id number]] Collected:  08/03/15 1640    Specimen Information:  Blood Updated:  08/03/15 1728     Troponin I <0.01 ng/mL     Comprehensive metabolic panel [[id number]]  (Abnormal) Collected:  08/03/15 1640    Specimen Information:  Blood Updated:  08/03/15 1721     Glucose 110 (H) mg/dL      BUN 62.9 mg/dL      Creatinine 0.9 mg/dL      Sodium 528 (L) mEq/L      Potassium 4.1 mEq/L      Chloride 102 mEq/L      CO2 31 (H) mEq/L      Calcium 9.1 mg/dL      Protein, Total 6.4 g/dL      Albumin 3.5 g/dL      AST (SGOT) 20 U/L      ALT 14 U/L      Alkaline Phosphatase 59 U/L      Bilirubin, Total 0.6 mg/dL      Globulin 2.9 g/dL      Albumin/Globulin Ratio 1.2      Anion Gap 2.0 (L)     Hemolysis index [[id number]] Collected:  08/03/15 1640     Hemolysis Index 7 Updated:  08/03/15 1721    GFR [[id number]] Collected:  08/03/15 1640     EGFR >60.0 Updated:  08/03/15 1721    D-Dimer [[id number]] Collected:  08/03/15 1640     D-Dimer <0.27 ug/mL FEU Updated:  08/03/15 1718    CBC with differential [[id number]]  (  Abnormal) Collected:  08/03/15 1640    Specimen Information:  Blood from Blood Updated:  08/03/15 1708     WBC 7.06 x10 3/uL      Hgb 10.7 (L) g/dL      Hematocrit 54.0 (L) %      Platelets 162 x10 3/uL      RBC 3.44 (L) x10 6/uL      MCV 91.3 fL      MCH 31.1 pg      MCHC 34.1 g/dL      RDW  13 %      MPV 10.4 fL      Neutrophils 49.6 %      Lymphocytes Automated 32.0 %      Monocytes 11.3 %      Eosinophils Automated 6.7 %      Basophils Automated 0.4 %      Immature Granulocyte 0.3 %      Nucleated RBC 0.0 /100 WBC      Neutrophils Absolute 3.50 x10 3/uL      Abs Lymph Automated 2.26 x10 3/uL      Abs Mono Automated 0.80 x10 3/uL      Abs Eos Automated 0.47 x10 3/uL      Absolute Baso Automated 0.03 x10 3/uL      Absolute Immature Granulocyte 0.02 x10 3/uL           Radiologic Studies -   Radiology Results (24 Hour)     Procedure Component Value Units Date/Time    Chest 2 Views [[id number]] Collected:  08/03/15 1737    Order Status:  Completed Updated:  08/03/15 1741    Narrative:       CLINICAL INDICATION: sob    COMPARISON: 03/24/2015    FINDINGS:   2 views of the chest were obtained. The lungs are clear. The  heart and vascularity are within normal limits. There is no pleural  thickening or effusion. No acute process of the bony thorax seen.      Impression:       No active cardiopulmonary disease.        Heron Nay, MD   08/03/2015 5:37 PM        .      Medical Decision Making   I am the first provider for this patient.    I reviewed the vital signs, available nursing notes, past medical history, past surgical history, family history and social history.    Vital Signs-Reviewed the patient's vital signs.     Patient Vitals for the past 12 hrs:   BP Temp Pulse Resp   08/03/15 1612 125/62 mmHg 97.7 F (36.5 C) 70 16       Pulse Oximetry Analysis -  Normal 97% on RA    Cardiac Monitor:  Rate: 71  Rhythm:  Normal Sinus Rhythm     EKG:  Interpreted by the EP.   Time Interpreted: 1608   Rate: 69   Rhythm: Normal Sinus Rhythm    Interpretation: Q wave in V1 and V2. O/w normal.    Comparison: No prior study is available for comparison.    Old Medical Records: Nursing notes.     ED Course:   4:49 PM - Will check labs and CXR. Pt agreeable with plan.   5:50 PM - Pt now states that he was found to be  bradycardiac today. 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 Resting comfortably at this time. Attempted to call the office from which he was directed and was sent to voicemail  6:48 PM - Pt left the ED to repark his car as he had parked not on hospital grounds. He was advised not to leave the ED during his visit.   7:39 PM - As per RN pt eloped.     Provider Notes:   50 y/o male presenting with shortness of breath and chest tightness. Was sent in for 'heart rate' which he was not sure what the heart rate was. Denies any recent long trips, trips out of the country, of other associated symptoms. The symptoms became worse approx 2 hours prior to arrival. No exertional quality, no decrease in exercise tolerance, chest pain or other related symptoms. Doubt pe, dissection, less likely cardiac cause.    EKG with q waves v1 v2, otherwise no acute ischemic changes. Labs are nondiagnostic.     HEART SCORE  Used for ACS Risk Stratification  Low Risk = total score of 0-3  High Risk = total score of 4 or greater    History:         Slightly suspicous  ECG:            Non specific repolarization disturbance                        +1  Age:              51-65                                                                             +1  Risk:             1-2 risk factors                                                              +1  Troponin:      <= normal limit    Score: 3    Given symptoms worse 2 hours ago will repeat in 3 hours.Marland Kitchen    He reports he needs to move his car. Advised that he should no do so.    Per RN pt eloped.      Diagnosis     Clinical Impression:   1. Shortness of breath        Disposition:   ED Disposition     Eloped           _______________________________    Attestations:  This note is prepared by Latrelle Dodrill, acting as Scribe for Geannie Risen, MD.     Geannie Risen, MD:  The scribe's documentation has been prepared under my  direction and personally reviewed by me in its entirety.  I confirm that the note above accurately reflects all work, treatment, procedures, and medical decision making performed by me.  _______________________________      Thea Gist, MD  08/05/15 1722    Geannie Risen  M, MD  08/05/15 1722    Thea Gist, MD  08/05/15 [phone]

## 2015-08-03 NOTE — ED Notes (Signed)
PT left AMA. He did not want to wait for paperwork. IV taken out. Pt was also found using the restroom in the lobby prior. Pt reports he was going to move his car. Charge notified.

## 2015-08-03 NOTE — ED Notes (Signed)
[name] [name] is a 50 y.o. male presenting with SOB and chest tightness x4 days getting progressively worse. Pt reports he had a physical today and they told him to come in and get checked out because of his "heart rate." Pt current everyday smoker. ax4 ambulatory BP 125/62 mmHg  Pulse 70  Temp(Src) 97.7 F (36.5 C)  Resp 16  Ht 5\' 8"  (1.727 m)  Wt 75.297 kg  BMI 25.25 kg/m2  SpO2 97%

## 2015-08-04 LAB — ECG 12-LEAD
Atrial Rate: 69 {beats}/min
P Axis: 65 degrees
P-R Interval: 150 ms
Q-T Interval: 386 ms
QRS Duration: 82 ms
QTC Calculation (Bezet): 413 ms
R Axis: -28 degrees
T Axis: 5 degrees
Ventricular Rate: 69 {beats}/min

## 2015-08-06 ENCOUNTER — Inpatient Hospital Stay: Payer: Medicaid Other

## 2015-08-06 ENCOUNTER — Inpatient Hospital Stay: Payer: Self-pay

## 2015-08-06 ENCOUNTER — Other Ambulatory Visit (INDEPENDENT_AMBULATORY_CARE_PROVIDER_SITE_OTHER): Payer: Self-pay

## 2015-08-06 ENCOUNTER — Inpatient Hospital Stay: Payer: Medicaid Other | Admitting: Student in an Organized Health Care Education/Training Program

## 2015-08-06 ENCOUNTER — Inpatient Hospital Stay
Admission: EM | Admit: 2015-08-06 | Discharge: 2015-08-07 | DRG: 204 | Disposition: A | Discharge status: Home / Self Care | Payer: Medicaid Other | Attending: Internal Medicine | Admitting: Internal Medicine

## 2015-08-06 ENCOUNTER — Emergency Department: Payer: Medicaid Other

## 2015-08-06 DIAGNOSIS — R7303 Prediabetes: Secondary | ICD-10-CM | POA: Diagnosis present

## 2015-08-06 DIAGNOSIS — Z87442 Personal history of urinary calculi: Secondary | ICD-10-CM

## 2015-08-06 DIAGNOSIS — F1910 Other psychoactive substance abuse, uncomplicated: Secondary | ICD-10-CM | POA: Diagnosis present

## 2015-08-06 DIAGNOSIS — R0789 Other chest pain: Secondary | ICD-10-CM

## 2015-08-06 DIAGNOSIS — R011 Cardiac murmur, unspecified: Secondary | ICD-10-CM | POA: Diagnosis present

## 2015-08-06 DIAGNOSIS — F17210 Nicotine dependence, cigarettes, uncomplicated: Secondary | ICD-10-CM | POA: Diagnosis present

## 2015-08-06 DIAGNOSIS — Z885 Allergy status to narcotic agent status: Secondary | ICD-10-CM

## 2015-08-06 DIAGNOSIS — I10 Essential (primary) hypertension: Secondary | ICD-10-CM | POA: Diagnosis present

## 2015-08-06 DIAGNOSIS — F1410 Cocaine abuse, uncomplicated: Secondary | ICD-10-CM | POA: Diagnosis present

## 2015-08-06 DIAGNOSIS — D649 Anemia, unspecified: Secondary | ICD-10-CM | POA: Diagnosis present

## 2015-08-06 DIAGNOSIS — R112 Nausea with vomiting, unspecified: Secondary | ICD-10-CM | POA: Diagnosis present

## 2015-08-06 DIAGNOSIS — R079 Chest pain, unspecified: Secondary | ICD-10-CM | POA: Diagnosis present

## 2015-08-06 DIAGNOSIS — F1110 Opioid abuse, uncomplicated: Secondary | ICD-10-CM | POA: Diagnosis present

## 2015-08-06 DIAGNOSIS — R071 Chest pain on breathing: Principal | ICD-10-CM | POA: Diagnosis present

## 2015-08-06 DIAGNOSIS — Z886 Allergy status to analgesic agent status: Secondary | ICD-10-CM

## 2015-08-06 LAB — COMPREHENSIVE METABOLIC PANEL
ALT: 14 U/L (ref 0–55)
AST (SGOT): 18 U/L (ref 5–34)
Albumin/Globulin Ratio: 1.4 (ref 0.9–2.2)
Albumin: 3.9 g/dL (ref 3.5–5.0)
Alkaline Phosphatase: 63 U/L (ref 38–106)
Anion Gap: 7 (ref 5.0–15.0)
BUN: 10 mg/dL (ref 9.0–28.0)
Bilirubin, Total: 0.4 mg/dL (ref 0.2–1.2)
CO2: 27 mEq/L (ref 22–29)
Calcium: 9.2 mg/dL (ref 8.5–10.5)
Chloride: 103 mEq/L (ref 100–111)
Creatinine: 1 mg/dL (ref 0.7–1.3)
Globulin: 2.8 g/dL (ref 2.0–3.6)
Glucose: 121 mg/dL — ABNORMAL HIGH (ref 70–100)
Potassium: 4.1 mEq/L (ref 3.5–5.1)
Protein, Total: 6.7 g/dL (ref 6.0–8.3)
Sodium: 137 mEq/L (ref 136–145)

## 2015-08-06 LAB — CBC AND DIFFERENTIAL
Basophils Absolute Automated: 0.03 10*3/uL (ref 0.00–0.20)
Basophils Automated: 0.5 %
Eosinophils Absolute Automated: 0.32 10*3/uL (ref 0.00–0.70)
Eosinophils Automated: 5.1 %
Hematocrit: 34.5 % — ABNORMAL LOW (ref 42.0–52.0)
Hgb: 11.7 g/dL — ABNORMAL LOW (ref 13.0–17.0)
Immature Granulocytes Absolute: 0.01 10*3/uL
Immature Granulocytes: 0.2 %
Lymphocytes Absolute Automated: 2.6 10*3/uL (ref 0.50–4.40)
Lymphocytes Automated: 41.3 %
MCH: 30.9 pg (ref 28.0–32.0)
MCHC: 33.9 g/dL (ref 32.0–36.0)
MCV: 91 fL (ref 80.0–100.0)
MPV: 10.2 fL (ref 9.4–12.3)
Monocytes Absolute Automated: 0.56 10*3/uL (ref 0.00–1.20)
Monocytes: 8.9 %
Neutrophils Absolute: 2.78 10*3/uL (ref 1.80–8.10)
Neutrophils: 44.2 %
Nucleated RBC: 0.9 /100 WBC (ref 0.0–1.0)
Platelets: 179 10*3/uL (ref 140–400)
RBC: 3.79 10*6/uL — ABNORMAL LOW (ref 4.70–6.00)
RDW: 13 % (ref 12–15)
WBC: 6.29 10*3/uL (ref 3.50–10.80)

## 2015-08-06 LAB — HEMOLYSIS INDEX
Hemolysis Index: 11 (ref 0–18)
Hemolysis Index: 12 (ref 0–18)

## 2015-08-06 LAB — GFR: EGFR: >60

## 2015-08-06 LAB — RAPID DRUG SCREEN, URINE
Barbiturate Screen, UR: NEGATIVE
Benzodiazepine Screen, UR: NEGATIVE
Cannabinoid Screen, UR: NEGATIVE
Cocaine, UR: NEGATIVE
Opiate Screen, UR: POSITIVE — AB
PCP Screen, UR: NEGATIVE
Urine Amphetamine Screen: NEGATIVE

## 2015-08-06 LAB — IHS D-DIMER: D-Dimer: 0.29 ug/mL FEU (ref 0.00–0.51)

## 2015-08-06 LAB — TROPONIN I
Troponin I: <0.01 ng/mL (ref 0.00–0.09)
Troponin I: <0.01 ng/mL (ref 0.00–0.09)
Troponin I: <0.01 ng/mL (ref 0.00–0.09)

## 2015-08-06 LAB — SEDIMENTATION RATE: Sed Rate: 9 mm/Hr (ref 0–15)

## 2015-08-06 LAB — LIPID PANEL
Cholesterol / HDL Ratio: 2.7
Cholesterol: 149 mg/dL (ref 0–199)
HDL: 55 mg/dL (ref 40–9999)
LDL Calculated: 82 mg/dL (ref 0–99)
Triglycerides: 62 mg/dL (ref 34–149)
VLDL Calculated: 12 mg/dL (ref 10–40)

## 2015-08-06 LAB — HEMOGLOBIN A1C
Average Estimated Glucose: 122.6 mg/dL
Hemoglobin A1C: 5.9 % (ref 4.6–5.9)

## 2015-08-06 LAB — C-REACTIVE PROTEIN: C-Reactive Protein: <0.1 mg/dL (ref 0.0–0.8)

## 2015-08-06 MED ORDER — FENTANYL CITRATE (PF) 50 MCG/ML IJ SOLN (WRAP)
25.0000 ug | Freq: Once | INTRAMUSCULAR | Status: AC
Start: 2015-08-06 — End: 2015-08-06
  Administered 2015-08-06: 25 ug via INTRAVENOUS
  Filled 2015-08-06: qty 2

## 2015-08-06 MED ORDER — PANTOPRAZOLE SODIUM 40 MG PO TBEC
40.0000 mg | DELAYED_RELEASE_TABLET | Freq: Every day | ORAL | Status: DC
Start: 2015-08-06 — End: 2015-08-07
  Administered 2015-08-06 – 2015-08-07 (×2): 40 mg via ORAL
  Filled 2015-08-06 (×2): qty 1

## 2015-08-06 MED ORDER — ONDANSETRON 4 MG PO TBDP
4.0000 mg | ORAL_TABLET | Freq: Four times a day (QID) | ORAL | Status: DC | PRN
Start: 2015-08-06 — End: 2015-08-07

## 2015-08-06 MED ORDER — ALUM & MAG HYDROXIDE-SIMETH 200-200-20 MG/5ML PO SUSP
30.0000 mL | Freq: Once | ORAL | Status: AC
Start: 2015-08-06 — End: 2015-08-06
  Administered 2015-08-06: 30 mL via ORAL
  Filled 2015-08-06: qty 30

## 2015-08-06 MED ORDER — ASPIRIN 81 MG PO CHEW
324.0000 mg | CHEWABLE_TABLET | Freq: Once | ORAL | Status: AC
Start: 2015-08-06 — End: 2015-08-06
  Administered 2015-08-06: 324 mg via ORAL
  Filled 2015-08-06: qty 4

## 2015-08-06 MED ORDER — TECHNETIUM TC 99M TETROFOSMIN INJECTION
1.0000 | Freq: Once | Status: AC | PRN
Start: 2015-08-06 — End: 2015-08-06
  Administered 2015-08-06: 1 via INTRAVENOUS

## 2015-08-06 MED ORDER — NALOXONE HCL 0.4 MG/ML IJ SOLN (WRAP)
0.2000 mg | INTRAMUSCULAR | Status: DC | PRN
Start: 2015-08-06 — End: 2015-08-07

## 2015-08-06 MED ORDER — ONDANSETRON HCL 4 MG/2ML IJ SOLN
4.0000 mg | Freq: Four times a day (QID) | INTRAMUSCULAR | Status: DC | PRN
Start: 2015-08-06 — End: 2015-08-07

## 2015-08-06 MED ORDER — KETOROLAC TROMETHAMINE 30 MG/ML IJ SOLN
15.0000 mg | Freq: Once | INTRAMUSCULAR | Status: AC
Start: 2015-08-06 — End: 2015-08-06
  Administered 2015-08-06: 15 mg via INTRAVENOUS
  Filled 2015-08-06: qty 1

## 2015-08-06 MED ORDER — FAMOTIDINE 20 MG PO TABS
20.0000 mg | ORAL_TABLET | Freq: Two times a day (BID) | ORAL | Status: DC
Start: 2015-08-06 — End: 2015-08-06

## 2015-08-06 MED ORDER — REGADENOSON 0.4 MG/5ML IV SOLN
INTRAVENOUS | Status: AC
Start: 2015-08-06 — End: 2015-08-06
  Filled 2015-08-06: qty 5

## 2015-08-06 MED ORDER — LIDOCAINE VISCOUS 2 % MT SOLN
10.0000 mL | Freq: Once | OROMUCOSAL | Status: AC
Start: 2015-08-06 — End: 2015-08-06
  Administered 2015-08-06: 10 mL via OROMUCOSAL
  Filled 2015-08-06: qty 15

## 2015-08-06 MED ORDER — CYCLOBENZAPRINE HCL 10 MG PO TABS
10.0000 mg | ORAL_TABLET | Freq: Three times a day (TID) | ORAL | Status: DC | PRN
Start: 2015-08-06 — End: 2015-08-07
  Administered 2015-08-06: 10 mg via ORAL
  Filled 2015-08-06: qty 1

## 2015-08-06 MED ORDER — IBUPROFEN 400 MG PO TABS
800.0000 mg | ORAL_TABLET | Freq: Three times a day (TID) | ORAL | Status: DC | PRN
Start: 2015-08-06 — End: 2015-08-07
  Administered 2015-08-06 – 2015-08-07 (×3): 800 mg via ORAL
  Filled 2015-08-06 (×3): qty 2

## 2015-08-06 MED ORDER — ENOXAPARIN SODIUM 40 MG/0.4ML SC SOLN
40.0000 mg | Freq: Every day | SUBCUTANEOUS | Status: DC
Start: 2015-08-06 — End: 2015-08-07
  Administered 2015-08-06 – 2015-08-07 (×2): 40 mg via SUBCUTANEOUS
  Filled 2015-08-06 (×2): qty 0.4

## 2015-08-06 MED ORDER — ONDANSETRON HCL 4 MG/2ML IJ SOLN
4.0000 mg | Freq: Three times a day (TID) | INTRAMUSCULAR | Status: AC | PRN
Start: 2015-08-06 — End: 2015-08-07

## 2015-08-06 MED ORDER — SODIUM CHLORIDE 0.9 % IV SOLN
INTRAVENOUS | Status: DC
Start: 2015-08-06 — End: 2015-08-06

## 2015-08-06 NOTE — Plan of Care (Signed)
Problem: Chest Pain  Goal: Vital signs and cardiac rhythm stable  Outcome: Progressing  The patient's learning abilities have been assessed. Pt is A&Ox4, no C/o of N/V or dizziness. Today's individualized plan of care to monitor for chest pain, pain management, hourly rounding, morning labs. Plan was discussed with the patient and care giver and agree to it. Patient demonstrates understanding of disease process, treatment plan, medications and consequences of noncompliance. All questions and concerns were addressed. Will continue to monitor.

## 2015-08-06 NOTE — H&P (Signed)
[facility]      Patient: [name] [name]  Date: 08/06/2015   DOB: 1965-06-06  Admission Date: 08/06/2015   MRN: [id number]  Attending: Derek Mound         Chief Complaint   Patient presents with   . Shortness of Breath   . Chest Pain      History Gathered From: Patient     HISTORY AND PHYSICAL     [name] [name] is a 50 y.o. male with a PMHx of drug abuse- heroin and cocaine, tobacco abuse who presented with chest pain that started 3 days ago. Pt reports to constant cp that began when he was getting a physical done. Per patient he was advised to come to the ED due to low HR, he does not remember what his HR was at time of exam. He was seen here in the ED a few days ago and eloped. Pt report cp to be constant, located in midsternal region, non radiating, describes it as someone sitting on his chest and constricting his breathing. Pt reports to cocaine and heroin abuse, last abuse was 5 days ago and used only heroin. He denies any increased physical activity or regular running however admitted to being physically active on daily basis a few days ago noted in ED provider notes. He reports no nausea, last episode of emesis was 3 days ago. Reports subjective fevers. He denies any alcohol use.     Past Medical History   Diagnosis Date   . Heart murmur    . Gallstones    . Gastritis    . Renal calculus        History reviewed. No pertinent past surgical history.    Prior to Admission medications    Medication Sig Start Date End Date Taking? Authorizing Provider   esomeprazole (NEXIUM) 20 MG capsule Take 1 capsule (20 mg total) by mouth every morning before breakfast. 02/18/15   Lorenza Cambridge, MD   promethazine (PHENERGAN) 25 MG tablet Take 0.5-1 tablets (12.5-25 mg total) by mouth every 6 (six) hours as needed for Nausea. 02/18/15   Lorenza Cambridge, MD   traMADol (ULTRAM) 50 MG tablet Take 1 tablet (50 mg total) by mouth every 6 (six) hours as needed. Do not drive or operate machinery while taking this medication  02/18/15   Lorenza Cambridge, MD       Allergies   Allergen Reactions   . Gluten      Pt states "white bread"   . Percocet [Oxycodone-Acetaminophen]    . Tylenol [Acetaminophen] Hives     Tyelnol#3, hives and throw up       CODE STATUS: Full    PRIMARY CARE MD: Pcp, Noneorunknown, MD    Family History   Problem Relation Age of Onset   . Diabetes Mother    . Hypertension Mother        Social History   Substance Use Topics   . Smoking status: Current Some Day Smoker -- 1.00 packs/day for .5 years     Types: Cigarettes   . Smokeless tobacco: None   . Alcohol Use: No      Comment: pt claims he "is not a drinker", admits to ETOH use tonight       REVIEW OF SYSTEMS   Positive for: cp, sob, subjective fevers, cough, headaches   Negative for: abdominal pain, nausea, vomiting, urinary complaints, diarrhea/constipation   All ROS completed and otherwise negative.    PHYSICAL  EXAM     Vital Signs (most recent): BP 127/88 mmHg  Pulse 56  Temp(Src) 97.9 F (36.6 C) (Oral)  Resp 16  Ht 1.727 m (5\' 8" )  Wt 73.483 kg (162 lb)  BMI 24.64 kg/m2  SpO2 100%  Constitutional: No apparent distress. Patient speaks freely in full sentences.   HEENT: NC/AT, PERRL, no scleral icterus or conjunctival pallor, no nasal discharge, MMM, oropharynx without erythema or exudate  Neck: trachea midline, supple, no cervical or supraclavicular lymphadenopathy or masses  Cardiovascular: RRR, normal S1 S2, no murmurs, gallops, palpable thrills, no JVD, Non-displaced PMI.  Respiratory: Normal rate. No retractions or increased work of breathing. Clear to auscultation and percussion bilaterally.  Gastrointestinal: +BS, non-distended, soft, non-tender, no rebound or guarding, no hepatosplenomegaly  Genitourinary: no suprapubic or costovertebral angle tenderness  Musculoskeletal: ROM and motor strength grossly normal. No clubbing, edema, or cyanosis. DP and radial pulses 2+ and symmetric.  Skin exam:  pink  Neurologic: EOMI, non focal   Psychiatric: AAOx3,  affect and mood appropriate. The patient is alert, interactive, appropriate.  Capillary refill:  Normal    Exam done by Derek Mound, MD on 08/06/2015 at 5:55 AM      LABS & IMAGING     Recent Results (from the past 24 hour(s))   Comprehensive Metabolic Panel (CMP)    Collection Time: 08/06/15  2:40 AM   Result Value Ref Range    Glucose 121 (H) 70 - 100 mg/dL    BUN 09.3 9.0 - 81.8 mg/dL    Creatinine 1.0 0.7 - 1.3 mg/dL    Sodium 299 371 - 696 mEq/L    Potassium 4.1 3.5 - 5.1 mEq/L    Chloride 103 100 - 111 mEq/L    CO2 27 22 - 29 mEq/L    Calcium 9.2 8.5 - 10.5 mg/dL    Protein, Total 6.7 6.0 - 8.3 g/dL    Albumin 3.9 3.5 - 5.0 g/dL    AST (SGOT) 18 5 - 34 U/L    ALT 14 0 - 55 U/L    Alkaline Phosphatase 63 38 - 106 U/L    Bilirubin, Total 0.4 0.2 - 1.2 mg/dL    Globulin 2.8 2.0 - 3.6 g/dL    Albumin/Globulin Ratio 1.4 0.9 - 2.2    Anion Gap 7.0 5.0 - 15.0   Troponin I    Collection Time: 08/06/15  2:40 AM   Result Value Ref Range    Troponin I <0.01 0.00 - 0.09 ng/mL   Hemolysis index    Collection Time: 08/06/15  2:40 AM   Result Value Ref Range    Hemolysis Index 11 0 - 18   GFR    Collection Time: 08/06/15  2:40 AM   Result Value Ref Range    EGFR >60.0    CBC and differential    Collection Time: 08/06/15  2:41 AM   Result Value Ref Range    WBC 6.29 3.50 - 10.80 x10 3/uL    Hgb 11.7 (L) 13.0 - 17.0 g/dL    Hematocrit 78.9 (L) 42.0 - 52.0 %    Platelets 179 140 - 400 x10 3/uL    RBC 3.79 (L) 4.70 - 6.00 x10 6/uL    MCV 91.0 80.0 - 100.0 fL    MCH 30.9 28.0 - 32.0 pg    MCHC 33.9 32.0 - 36.0 g/dL    RDW 13 12 - 15 %    MPV 10.2 9.4 - 12.3 fL  Neutrophils 44.2 None %    Lymphocytes Automated 41.3 None %    Monocytes 8.9 None %    Eosinophils Automated 5.1 None %    Basophils Automated 0.5 None %    Immature Granulocyte 0.2 None %    Nucleated RBC 0.9 0.0 - 1.0 /100 WBC    Neutrophils Absolute 2.78 1.80 - 8.10 x10 3/uL    Abs Lymph Automated 2.60 0.50 - 4.40 x10 3/uL    Abs Mono Automated 0.56 0.00 - 1.20  x10 3/uL    Abs Eos Automated 0.32 0.00 - 0.70 x10 3/uL    Absolute Baso Automated 0.03 0.00 - 0.20 x10 3/uL    Absolute Immature Granulocyte 0.01 0 x10 3/uL     IMAGING:  CXR  No acute finding.    Markers:    Recent Labs  Lab 08/06/15  0240 08/03/15  1640   TROPONIN I <0.01 <0.01       EMERGENCY DEPARTMENT COURSE:  Orders Placed This Encounter   Procedures   . Chest AP Portable   . CBC and differential   . Comprehensive Metabolic Panel (CMP)   . Troponin I   . Hemolysis index   . GFR   . Troponin I   . Comprehensive metabolic panel   . CBC with differential   . Magnesium   . Phosphorus   . Troponin I   . Lipid panel   . Hemoglobin A1C   . Diet NPO effective now   . Cardiac Monitoring   . ED Holding Orders Expire in 24 Hours   . Notify Admitting Attending ( Change in Condition)   . Notify Attending of Patient Arrival to Floor within 24 Hours   . Notify Physician (Vital Signs)   . Notify Physician (Lab Results)   . Vital Signs Q4HR   . Activity as Tolerated   . Notify Admitting Physician:Change in Condition/Abnormal VS   . Notify Admitting Physician:Chest Pain/EKG   . Notify physician   . I/O   . Height   . Weight   . Skin assessment   . Place sequential compression device   . Education: Activity   . Education: Disease Process & Condition   . Education: Pain Management   . Education: Falls Risk   . Education: Smoking Cessation   . Vital signs   . Pulse Oximetry   . Telemetry 24 Hour Protocol   . Full Code   . ED Unit Sec Comm Order   . ECG 12 Lead   . Admit to Inpatient   . [facility] ED Bed Request (Inpatient)       ASSESSMENT & PLAN     [name] [name] is a 50 y.o. male admitted under [facility] with chest pain     Patient Active Hospital Problem List:   Chest pain, unspecified type (08/06/2015)   Heroin abuse    Cocaine abuse    Tobacco abuse      - most likely 2/2 costocondritis vs gastritis, will do trial of GI cocktail  - risk factors for cardiac disease include tobacco abuse. Pt also has an abnormal EKG  -  troponin negative x1, serial troponins ordered  - cards consulted by ED physician, follow up recs   - declined nicotine patch   - collect urine drug screen     Nutrition  NPO     DVT/VTE Prophylaxis  Lovenox     [place] Pho    08/06/2015 5:55 AM  Time Elapsed: 45 minutes

## 2015-08-06 NOTE — ED Notes (Signed)
Pt c/o chest pain and sob - was seen here on 6/7 for the same cc. States pain is worse since. Also reports "fluttering" and pressure on his chest. States vomiting x1 2 days ago. Denies recent travel, n/d, fever. Also reports he ran out of his dilaudid 4 days ago, which he takes for gastritis.

## 2015-08-06 NOTE — ED Notes (Signed)
PT is AAOX4, MAEx4 ambulates w/o assistance and and all labs are normal with this PT.  Any questions feel free to call [phone]

## 2015-08-06 NOTE — Plan of Care (Addendum)
Received pt from ED, arrived on the unit at 0451, conscious and coherent, no SOB observed, O2 sat at 100% on RA. Pt was able to transfer from wheelchair to bed without difficulty, with an IV on his right AC saline locked, dressing is clean, dry and intact. Pt still complains of chest pain non radiatiang, characterized as a pressure, pain scale of 6/10. SB on the monitor, VSS.    Problem: Chest Pain  Goal: Vital signs and cardiac rhythm stable  Outcome: Progressing  Outcome: Pt remained stable during the shift, VSS  The ffg interventions were done:  -hourly rounding  -fall prec initiated  -assessed and monitored VS as well as for CP and O2 sat    Addendum:  Pt was oriented to room 2513A, telemetry initiated and verified, VS taken and documented, MD orders verified. Admission paperwork completed, white board updated. Educated regarding fall and safety prevention plan. Call bell, phone, bedside table and other belonging within reached.

## 2015-08-06 NOTE — Consults (Signed)
PROGRESS NOTE    Date Time: 08/06/2015 4:00 PM  Patient Name: [name] [name], [name] [name]      Assessment:     1. Chest pain  2. Hypertension  3. Cigarette smoking  4. Anemia    Plan:   1. Cardiac enzymes; done.  2. Thyroid function test and lipid panel.  3. Nuclear stress test..  4. Smoking cessation  Subjective/chief complaint:   Patient is seen and examined  All medications and labs  Reviewed    HPI;  Patient is 50 year old man with a history of smoking cigarettes and occasionally cocaine use.  He presents to the hospital with chest pain and generalized pain.  Patient does not have history of heart attack.    Past medical history; as above    Medications: As listed      Current Facility-Administered Medications   Medication Dose Route Frequency   . enoxaparin  40 mg Subcutaneous Daily   . pantoprazole  40 mg Oral Daily       Review of Systems:   General ROS: no weight loss, no weight gain, no fever, no chills  Endocrine ROS: + fatigue, no polyuria, no polydipsia, no general weakness  Respiratory ROS: no shortness of breath, no cough, no wheezes and no hemoptysis  Cardiovascular ROS:+chest pain, no palpitations, no PND and no orthopnea, +DOE  Gastrointestinal ROS:no nausea, no vomiting, no diarrhea, no constipation, no blood in stool and no abdominal pain  Musculoskeletal ROS: no muscle pain, no muscle weakness, no joint pain, no swelling and no redness  Neurological ROS: no headache, no dizziness, no diplopia, no focal weakness, no seizure  Dermatological ROS: no rash, no itching and no ecchymoses, no pressure ulcer      Physical Exam:   BP 132/72 mmHg  Pulse 52  Temp(Src) 99.3 F (37.4 C) (Oral)  Resp 16  Ht 1.727 Lige Lakeman (5\' 8" )  Wt 73.483 kg (162 lb)  BMI 24.64 kg/m2  SpO2 99%      Intake/Output Summary (Last 24 hours) at 08/06/15 1600  Last data filed at 08/06/15 1040   Gross per 24 hour   Intake    120 ml   Output      0 ml   Net    120 ml       General appearance - alert, well appearing, and in no distress  Mental  status - alert, oriented to person, place, and time  HEENT:normocephalic, , no icterus, no pallor, no cyanosis  Neck: supple, no JVD, no thyromegaly, no carotid bruits  Chest - clear to auscultation, no wheezes, rales or rhonchi, symmetric air entry  Heart - normal rate, regular rhythm, normal S1, S2, no S3 + murmurs, rubs, clicks or gallops  Abdomen - soft, nontender, nondistended, no masses or organomegaly  Neurological - alert, oriented, normal speech, no focal findings  noted  Musculoskeletal - no joint tenderness, deformity or swelling seen.  Extremities - peripheral pulses palpable, no pedal edema, no clubbing or cyanosis  Skin - normal color , no rashes.    Labs:     CBC w/Diff     Recent Labs  Lab 08/06/15  0241 08/03/15  1640   WBC 6.29 7.06   HGB 11.7* 10.7*   HEMATOCRIT 34.5* 31.4*   PLATELETS 179 162          Basic Metabolic Profile     Recent Labs  Lab 08/06/15  0240 08/03/15  1640   SODIUM 137 135*   POTASSIUM 4.1 4.1  CHLORIDE 103 102   CO2 27 31*   BUN 10.0 12.0   CREATININE 1.0 0.9   CALCIUM 9.2 9.1   ALT 14 14   AST (SGOT) 18 20   GLUCOSE 121* 110*          Cardiac Enzymes     Recent Labs  Lab 08/06/15  0952 08/06/15  0240 08/03/15  1640   TROPONIN I <0.01 <0.01 <0.01       Lab Results   Component Value Date    BNP <10 02/04/2015    BNP <10 10/21/2011    BNP 14 09/12/2011    BNP 40 11/22/2008          Thyroid Studies      Recent Labs  Lab 08/03/15  1640   THYROID STIMULATING HORMONE 0.77           Lipid Profile     Recent Labs  Lab 08/06/15  0240   CHOLESTEROL 149          Radiology Results (24 Hour)     Procedure Component Value Units Date/Time    NM Myocardial Perfusion Spect (Stress And Rest) [[id number]] Collected:  08/06/15 1541    Order Status:  Completed Updated:  08/06/15 1548    Narrative:          Eugenie Birks MYOVIEW PERFUSION STUDY:    Procedure: The patient was injected intravenously with 10.4 mCi of  Myoview and SPECT imaging was begun 30 minutes post injection. The  camera obtained  images with a 180-degree orbit around the patients  heart. After the rest images, the patient was then prepped for a  Lexiscan pharmacologic stress test. The patient was given 5 ml (0.4mg )  of IV Lexiscan (Regadenoson) over a ten second rapid infusion with a  five to ten ml normal saline flush given immediately after the infusion.  The patient was then given an intravenous injection of 29.6 mCi of  Myoview followed by another five to ten ml saline flush. The patient was  monitored for another eight to ten minutes or until the patients EKG,  BP, and symptoms came back to baseline. Gated SPECT imaging was started  45 minutes post injection. Gated SPECT imaging was started at least 30  minutes post injection. The patient was then placed in the supine  position and the images were obtained with a 180-degree orbit around the  patients heart while gating to the patients ECG. The study was then  processed and displayed for evaluation.            Impression:              The above interpretation is as per the stressing physician. See separate  stress sheet for details.      INTERPRETATION: The overall quality of the study is good. Attenuation  artifact is not noted. The left ventricle is normal in size.   Pharmacologic stress SPECT images reveal normal radiotracer uptake of  the anterior wall, lateral wall, septal wall and inferior wall. There is  no reversible defect suggest ischemia. The gated wall motion study  reveals normal wall motion. The left ventricular systolic function  normal The calculated left ventricular ejection fraction is 67%.    CONCLUSION:    1. Negative pharmacologic stress and rest myocardial perfusion study     .  2. Normal left ventricular systolic function with an ejection fraction  of 67%.  3. No myocardial ischemia is seen.    Rafiq  Aerial Dilley, MD   08/06/2015 3:44 PM      CV Cardiac Stress Test Tracing Only [[id number]] Collected:  08/06/15 1241    Order Status:  Completed Updated:  08/06/15 1247     Narrative:      HISTORY / INDICATION: Chest pain    EXAM:  BP 130/87 mmHg, HR 54 bpm                   RESTING ECG: Sinus bradycardia, inverted T in leads II, III, aVF and V4  V5 V6    PROTOCOL: Regadenoson  Infusion rate: 1.2 mg/min  total dose:  0.4 mg  Tracer injection time: 1 minute  Vitals at time of tracer injection: 131/74 mmHg, HR 52 bpm bpm    Reason for termination: Protocol completed  Symptoms: None  stress EKG; : Normal sinus rhythm no significant ST-T abnormalities  Arrhythmia: None  Recovery: Normal      Impression:        1. Await nuclear imaging  2. No complications with regadenoson  3. Negative Lexiscan stress test    [name], MD   08/06/2015 12:43 PM      Chest AP Portable [[id number]] Collected:  08/06/15 0300    Order Status:  Completed Updated:  08/06/15 0306    Narrative:      PORTABLE CHEST    CLINICAL STATEMENT: cp    COMPARISON: Chest radiographs dated 08/03/2015 showing no active  cardiopulmonary disease.    FINDINGS: The lungs are clear. The cardiomediastinal silhouette is  unremarkable.          Impression:        No acute finding.    [name], MD   08/06/2015 3:02 AM                  Sande Brothers, MD  08/06/2015 4:00 PM

## 2015-08-06 NOTE — Plan of Care (Signed)
Problem: Safety  Goal: Patient will be free from injury during hospitalization  Intervention: Provide and maintain safe environment  Falls precautions maintained. Bed in lowest locked position. Call bell within reach, bedside table within reach. Yellow "falls" socks and arm band on patient.  Patient aware to use call bell when needing the nurse or needing to get up for bathroom.  Hourly rounding done with visual of patient.  All questions answered.       Problem: Chest Pain  Goal: Vital signs and cardiac rhythm stable  Intervention: Monitor/assess vital signs/cardiac rhythm.  The patient and care giver's learning abilities have been assessed. Today's individualized plan of care to Monitor and assess vitals, monitor cardiac labs/ electrolytes, assess chest pain, pain management, Safety.  Plan of care was discussed with the patient and care giver and agree to it. Patient and care giver demonstrates understanding of disease process, treatment plan, medications and consequences of noncompliance. All questions and concerns were addressed.             Comments:   VSS, Afebrile, NSR with arrhythmia. HR between 70-90's.  Patient states he has chest pressure worst with breathing and yawning.  Pain monitor and Ibuprofen and flexeril given PO.  Patient had stress test and ECHO done today with EF 67%.  Stress test WNL.    Possible discharge tomorrow.

## 2015-08-06 NOTE — Progress Notes (Signed)
[facility]  PROGRESS NOTE      Patient: [name] [name]  Date: 08/06/2015   LOS: 0 Days  Admission Date: 08/06/2015   MRN: [id number]  Attending: Rolan Bucco    7 AM to 7 PM: Please contact me on the following pager [phone]  After 7 PM: Contact the night hospitalist         ASSESSMENT/PLAN     SUFIAN RAVI is a 50 y.o. male admitted with Chest pain, unspecified type      Plan for the day   DDIMER low. Low probability of PE. No need for CTA    Pain on palpation of the sternum. No evidence of fever or WBC.     Given prediabetes, smoking history, cocaine, gestault 2-3 Heart score. Have ordered stress test and echo with ESR CRP to assess for possible pericarditis component vs GERD vs chostrochontritis.      Principal Problem:    Chest pain, unspecified type  Active Problems:    Cocaine abuse    Nausea & vomiting    Substance abuse    Prediabetes      Chest Pain, typical    Plan  - Cards following appreciate recs  - Troponin trending  - ECG for any chest pain  - NPO at midnight for perfusion stress in the am.   -           Lipid panel and HgA1C      Recent Labs  Lab 08/06/15  0240   CHOLESTEROL 149   TRIGLYCERIDES 62   HDL 55   LDL CALCULATED 82         Nausea and vomiting  Plan  - Fluids   - Anti-emetics with zofran Q6    Prediabetes  SSI    Analgesia: cyclobenzaparin    Nutrition: cardiac    DVT Prophylaxis:enoxaparin.       Code Status: full    DISPO: home    Family Contact: none       SUBJECTIVE     [name] [name] states that he has pain on palpation of his ribs.       MEDICATIONS     Current Facility-Administered Medications   Medication Dose Route Frequency   . enoxaparin  40 mg Subcutaneous Daily   . pantoprazole  40 mg Oral Daily       PHYSICAL EXAM     Filed Vitals:    08/06/15 0807   BP: 123/66   Pulse: 64   Temp: 98.6 F (37 C)   Resp: 16   SpO2: 100%       Temperature: Temp  Min: 97.8 F (36.6 C)  Max: 98.6 F (37 C)  Pulse: Pulse  Min: 50  Max: 64  Respiratory: Resp  Min: 14  Max: 20  Non-Invasive BP: BP   Min: 122/82  Max: 147/85  Pulse Oximetry SpO2  Min: 99 %  Max: 100 %    Intake and Output Summary (Last 24 hours) at Date Time    Intake/Output Summary (Last 24 hours) at 08/06/15 1056  Last data filed at 08/06/15 1040   Gross per 24 hour   Intake    120 ml   Output      0 ml   Net    120 ml         GEN APPEARANCE: Normal;  A&OX3  HEENT: PERLA; EOMI; Conjunctiva Clear  NECK: Supple; No bruits  CVS: RRR, S1, S2; No  M/G/R. Pain on palpation of his ribs.   LUNGS: CTAB; No Wheezes; No Rhonchi: No rales  ABD: Soft; No TTP; + Normoactive BS  EXT: No edema; Pulses 2+ and intact  Skin exam:  pink  NEURO: CN 2-12 intact; No Focal neurological deficits  CAP REFILL:  Normal  MENTAL STATUS:  Normal        LABS       Recent Labs  Lab 08/06/15  0241 08/03/15  1640   WBC 6.29 7.06   RBC 3.79* 3.44*   HGB 11.7* 10.7*   HEMATOCRIT 34.5* 31.4*   MCV 91.0 91.3   PLATELETS 179 162         Recent Labs  Lab 08/06/15  0240 08/03/15  1640   SODIUM 137 135*   POTASSIUM 4.1 4.1   CHLORIDE 103 102   CO2 27 31*   BUN 10.0 12.0   CREATININE 1.0 0.9   GLUCOSE 121* 110*   CALCIUM 9.2 9.1         Recent Labs  Lab 08/06/15  0240 08/03/15  1640   ALT 14 14   AST (SGOT) 18 20   BILIRUBIN, TOTAL 0.4 0.6   ALBUMIN 3.9 3.5   ALKALINE PHOSPHATASE 63 59         Recent Labs  Lab 08/06/15  0952 08/06/15  0240 08/03/15  1640   TROPONIN I <0.01 <0.01 <0.01               Microbiology Results     None           RADIOLOGY     Upon my review:    Patrcia Dolly  10:56 AM 08/06/2015

## 2015-08-06 NOTE — Progress Notes (Signed)
Patient returned to unit from Stress Test. VSS obtained. Tele monitor placed back on patient.  Patient voiced no distress.  Still rates pain at 6-7/10 on chest pressure. Will continue to monitor.

## 2015-08-06 NOTE — Progress Notes (Signed)
Patient transported to Nuclear med for stress test. Tele box removed and placed at bedside.  VSS. Report given to nurse

## 2015-08-06 NOTE — Progress Notes (Signed)
ED ISHAPED/ chart reviewed

## 2015-08-06 NOTE — ED Notes (Addendum)
Pt seen sleeping w/o any problem or facial grimace.  Pt easy to arouse, and has a strong nicotine odor like has been chain smoking.

## 2015-08-06 NOTE — ED Provider Notes (Signed)
EMERGENCY DEPARTMENT HISTORY AND PHYSICAL EXAM     Physician/Midlevel provider first contact with patient: 08/06/15 0240         Date: 08/06/2015  Patient Name: [name] [name]    History of Presenting Illness     Chief Complaint   Patient presents with   . Shortness of Breath   . Chest Pain     History Provided By: Patient    Chief Complaint: SOB  Onset: 2 days ago  Timing: Persistent  Location: Respiratory  Quality: Difficulty breathing  Severity: Moderate  Modifying factors: None tried  Associated Symptoms: N/V, CP  Pertinent Negatives: cough, rhinorrhea    Additional History: [name] [name] is a 50 y.o. male presenting to the ED with c/o SOB since 2 days ago. Pt reports CP(tightness), n/v. Pt reports having a physical exam done a few days ago and was told his EKG "looked terrible" and sent to the ED. Pt smokes tobacco. Pt denies any lifting injury from exercising. Pt denies cough or rhinorrhea.     PCP: Pcp, Noneorunknown, MD  SPECIALISTS:    Current Facility-Administered Medications   Medication Dose Route Frequency Provider Last Rate Last Dose   . 0.9%  NaCl infusion   Intravenous Continuous Doy Hutching D, MD 100 mL/hr at 08/06/15 5409     . enoxaparin (LOVENOX) syringe 40 mg  40 mg Subcutaneous Daily Matiana, Suman D, MD       . naloxone (NARCAN) injection 0.2 mg  0.2 mg Intravenous PRN Doy Hutching D, MD       . ondansetron (ZOFRAN) injection 4 mg  4 mg Intravenous Q8H PRN Maryella Shivers, MD       . ondansetron (ZOFRAN-ODT) disintegrating tablet 4 mg  4 mg Oral Q6H PRN Matiana, Suman D, MD        Or   . ondansetron (ZOFRAN) injection 4 mg  4 mg Intravenous Q6H PRN Derek Mound, MD           Past History     Past Medical History:  Past Medical History   Diagnosis Date   . Heart murmur    . Gallstones    . Gastritis    . Renal calculus        Past Surgical History:  History reviewed. No pertinent past surgical history.    Family History:  Family History   Problem Relation Age of Onset   . Diabetes Mother     . Hypertension Mother        Social History:  Social History   Substance Use Topics   . Smoking status: Current Some Day Smoker -- 1.00 packs/day for .5 years     Types: Cigarettes   . Smokeless tobacco: None   . Alcohol Use: No      Comment: pt claims he "is not a drinker", admits to ETOH use tonight       Allergies:  Allergies   Allergen Reactions   . Gluten      Pt states "white bread"   . Percocet [Oxycodone-Acetaminophen]    . Tylenol [Acetaminophen] Hives     Tyelnol#3, hives and throw up       Review of Systems     Review of Systems   Constitutional: Negative for fever and fatigue.   HENT: Negative for rhinorrhea and sore throat.    Eyes: Negative for discharge, redness and visual disturbance.   Respiratory: Positive for shortness of breath. Negative for cough.  Cardiovascular: Positive for chest pain. Negative for leg swelling.   Gastrointestinal: Positive for nausea and vomiting. Negative for abdominal pain.   Endocrine: Negative for polyuria.   Genitourinary: Negative for dysuria, urgency, frequency and flank pain.   Musculoskeletal: Negative for back pain, neck pain and neck stiffness.   Skin: Negative for rash.   Allergic/Immunologic: Negative for immunocompromised state.   Neurological: Negative for light-headedness and headaches.   Hematological: Does not bruise/bleed easily.   Psychiatric/Behavioral: Negative for suicidal ideas.          Physical Exam   BP 127/88 mmHg  Pulse 56  Temp(Src) 97.9 F (36.6 C) (Oral)  Resp 16  Ht 5\' 8"  (1.727 m)  Wt 73.483 kg  BMI 24.64 kg/m2  SpO2 100%    Constitutional: Vital signs reviewed. Well appearing. No distress.  Head: Normocephalic, atraumatic  Eyes: Conjunctiva and sclera are normal.  No injection or discharge.  Ears, Nose, Throat:  Normal external examination of the nose and ears.  Mucous membranes moist.  Neck: Normal range of motion. Supple, no meningeal signs. Trachea midline. No JVD  Respiratory/Chest: Clear to auscultation. No respiratory  distress.   Cardiovascular: Regular rate and rhythm. No murmurs.  Abdomen:  Bowel sounds intact. No rebound or guarding. Soft.  Non-tender.  Back: No cva tenderness to percussion.  Upper Extremity:  No edema. No cyanosis. Bilateral radial pulses intact and equal.   Lower Extremity:  No edema. No cyanosis. Bilateral calves symmetrical and non-tender. Bilateral femoral, DP, PT pulses intact and equal.  Skin: Warm and dry. No rash.  Neuro: A&Ox3. CNII -XII intact to testing. Strength 5/5 and symmetrical in the bilateral upper and lower extremities. Sensation to sharp touch intact and equal in the bilateral upper and lower extremities. Coordination intact to finger to nose testing. Normal gait.   Psychiatric: Normal affect.  Normal insight.          Diagnostic Study Results     Labs -     Results     Procedure Component Value Units Date/Time    Lipid panel [[id number]] Collected:  08/06/15 0240    Specimen Information:  Blood Updated:  08/06/15 0618    Narrative:      Fasting specimen    Hemoglobin A1C [[id number]] Collected:  08/06/15 0240    Specimen Information:  Blood Updated:  08/06/15 0616    Troponin I [[id number]] Collected:  08/06/15 0240    Specimen Information:  Blood Updated:  08/06/15 0316     Troponin I <0.01 ng/mL     Comprehensive Metabolic Panel (CMP) [[id number]]  (Abnormal) Collected:  08/06/15 0240    Specimen Information:  Blood Updated:  08/06/15 0308     Glucose 121 (H) mg/dL      BUN 64.3 mg/dL      Creatinine 1.0 mg/dL      Sodium 329 mEq/L      Potassium 4.1 mEq/L      Chloride 103 mEq/L      CO2 27 mEq/L      Calcium 9.2 mg/dL      Protein, Total 6.7 g/dL      Albumin 3.9 g/dL      AST (SGOT) 18 U/L      ALT 14 U/L      Alkaline Phosphatase 63 U/L      Bilirubin, Total 0.4 mg/dL      Globulin 2.8 g/dL      Albumin/Globulin Ratio 1.4      Anion Gap 7.0  Hemolysis index [[id number]] Collected:  08/06/15 0240     Hemolysis Index 11 Updated:  08/06/15 0308    GFR [[id number]] Collected:  08/06/15  0240     EGFR >60.0 Updated:  08/06/15 0308    CBC and differential [[id number]]  (Abnormal) Collected:  08/06/15 0241    Specimen Information:  Blood from Blood Updated:  08/06/15 0300     WBC 6.29 x10 3/uL      Hgb 11.7 (L) g/dL      Hematocrit 18.8 (L) %      Platelets 179 x10 3/uL      RBC 3.79 (L) x10 6/uL      MCV 91.0 fL      MCH 30.9 pg      MCHC 33.9 g/dL      RDW 13 %      MPV 10.2 fL      Neutrophils 44.2 %      Lymphocytes Automated 41.3 %      Monocytes 8.9 %      Eosinophils Automated 5.1 %      Basophils Automated 0.5 %      Immature Granulocyte 0.2 %      Nucleated RBC 0.9 /100 WBC      Neutrophils Absolute 2.78 x10 3/uL      Abs Lymph Automated 2.60 x10 3/uL      Abs Mono Automated 0.56 x10 3/uL      Abs Eos Automated 0.32 x10 3/uL      Absolute Baso Automated 0.03 x10 3/uL      Absolute Immature Granulocyte 0.01 x10 3/uL           Radiologic Studies -   Radiology Results (24 Hour)     Procedure Component Value Units Date/Time    Chest AP Portable [[id number]] Collected:  08/06/15 0300    Order Status:  Completed Updated:  08/06/15 0306    Narrative:      PORTABLE CHEST    CLINICAL STATEMENT: cp    COMPARISON: Chest radiographs dated 08/03/2015 showing no active  cardiopulmonary disease.    FINDINGS: The lungs are clear. The cardiomediastinal silhouette is  unremarkable.          Impression:        No acute finding.    [name], MD   08/06/2015 3:02 AM        .    Medical Decision Making   I am the first provider for this patient.    I reviewed the vital signs, available nursing notes, past medical history, past surgical history, family history and social history.    Vital Signs-Reviewed the patient's vital signs.     Patient Vitals for the past 12 hrs:   BP Temp Pulse Resp   08/06/15 0451 127/88 mmHg 97.9 F (36.6 C) (!) 56 16   08/06/15 0414 122/82 mmHg - (!) 50 14   08/06/15 0211 147/85 mmHg 97.8 F (36.6 C) (!) 57 20     Pulse Oximetry Analysis - Normal 100% on RA    EKG:  Interpreted by  the EP.   Time Interpreted: 208   Rate: 58   Rhythm: Sinus Bradycardia    Interpretation: No ST elevation. T wave inversions in V5, V6.    Comparison: Sinus bradycardia is new and T wave inversions in V5, V6 are new. T wave flattening in lead II.    Admit Decision Time:  The decision to admit this patient was made by the  emergency provider at 3:56 AM on 08/06/2015     Old Medical Records: Nursing notes.     ED Course:   2:41 AM - Discussed plan for labs and CXR.    3:56 AM - Discussed pt case with Dr. Merlene Laughter, internal medicine, who accepts for hospitalization.    Provider Notes: HEART score 4. Doubt ACS given duration of symptoms with negative troponin. Low suspicion for aortic dissection based on equal bilateral radial pulses, no mediastinal widening on CXR, and patient's description of symptoms. No pulsatile abdominal mass or abdominal tenderness to suggest AAA. Wells score zero,  low suspicion for PE. No infectious complaints or risk factors to suggest pericarditis or endocarditis. No JVD or cardiac enlargement on CXR to suggest tamponade.     Diagnosis     Clinical Impression:   1. Chest pain, unspecified type        Treatment Plan:   ED Disposition     Admit Admitting Physician: Derek Mound [[id number]]  Diagnosis: Chest pain, unspecified type [[id number]]  Estimated Length of Stay: > or = to 2 midnights  Tentative Discharge Plan?: Home or Self Care [1]  Patient Class: Inpatient [101]              _______________________________      Attestations: This note is prepared by Marland Kitchen, acting as scribe for Lynnea Ferrier, MD.    Lynnea Ferrier, MD - The scribe's documentation has been prepared under my direction and personally reviewed by me in its entirety.  I confirm that the note above accurately reflects all work, treatment, procedures, and medical decision making performed by me.    _______________________________    Maryella Shivers, MD  08/07/15 [phone]

## 2015-08-07 DIAGNOSIS — R0789 Other chest pain: Secondary | ICD-10-CM

## 2015-08-07 LAB — HEMOLYSIS INDEX: Hemolysis Index: 4 (ref 0–18)

## 2015-08-07 LAB — COMPREHENSIVE METABOLIC PANEL
ALT: 12 U/L (ref 0–55)
AST (SGOT): 17 U/L (ref 5–34)
Albumin/Globulin Ratio: 1.2 (ref 0.9–2.2)
Albumin: 3.5 g/dL (ref 3.5–5.0)
Alkaline Phosphatase: 61 U/L (ref 38–106)
Anion Gap: 6 (ref 5.0–15.0)
BUN: 9 mg/dL (ref 9.0–28.0)
Bilirubin, Total: 1.1 mg/dL (ref 0.2–1.2)
CO2: 27 mEq/L (ref 22–29)
Calcium: 9.3 mg/dL (ref 8.5–10.5)
Chloride: 106 mEq/L (ref 100–111)
Creatinine: 1 mg/dL (ref 0.7–1.3)
Globulin: 3 g/dL (ref 2.0–3.6)
Glucose: 99 mg/dL (ref 70–100)
Potassium: 4 mEq/L (ref 3.5–5.1)
Protein, Total: 6.5 g/dL (ref 6.0–8.3)
Sodium: 139 mEq/L (ref 136–145)

## 2015-08-07 LAB — CBC AND DIFFERENTIAL
Basophils Absolute Automated: 0.02 10*3/uL (ref 0.00–0.20)
Basophils Automated: 0.3 %
Eosinophils Absolute Automated: 0.13 10*3/uL (ref 0.00–0.70)
Eosinophils Automated: 1.9 %
Hematocrit: 35.2 % — ABNORMAL LOW (ref 42.0–52.0)
Hgb: 12.2 g/dL — ABNORMAL LOW (ref 13.0–17.0)
Immature Granulocytes Absolute: 0.01 10*3/uL
Immature Granulocytes: 0.1 %
Lymphocytes Absolute Automated: 2.04 10*3/uL (ref 0.50–4.40)
Lymphocytes Automated: 30.4 %
MCH: 31.3 pg (ref 28.0–32.0)
MCHC: 34.7 g/dL (ref 32.0–36.0)
MCV: 90.3 fL (ref 80.0–100.0)
MPV: 10.5 fL (ref 9.4–12.3)
Monocytes Absolute Automated: 0.62 10*3/uL (ref 0.00–1.20)
Monocytes: 9.3 %
Neutrophils Absolute: 3.89 10*3/uL (ref 1.80–8.10)
Neutrophils: 58.1 %
Nucleated RBC: 0 /100 WBC (ref 0.0–1.0)
Platelets: 180 10*3/uL (ref 140–400)
RBC: 3.9 10*6/uL — ABNORMAL LOW (ref 4.70–6.00)
RDW: 13 % (ref 12–15)
WBC: 6.7 10*3/uL (ref 3.50–10.80)

## 2015-08-07 LAB — MAGNESIUM: Magnesium: 1.9 mg/dL (ref 1.6–2.6)

## 2015-08-07 LAB — PHOSPHORUS: Phosphorus: 3.5 mg/dL (ref 2.3–4.7)

## 2015-08-07 LAB — ECG 12-LEAD
Atrial Rate: 58 {beats}/min
P Axis: 56 degrees
P-R Interval: 146 ms
Q-T Interval: 416 ms
QRS Duration: 82 ms
QTC Calculation (Bezet): 408 ms
R Axis: -28 degrees
T Axis: -28 degrees
Ventricular Rate: 58 {beats}/min

## 2015-08-07 LAB — GFR: EGFR: >60

## 2015-08-07 MED ORDER — ESOMEPRAZOLE MAGNESIUM 20 MG PO CPDR
20.0000 mg | DELAYED_RELEASE_CAPSULE | Freq: Every morning | ORAL | Status: DC
Start: 2015-08-07 — End: 2015-08-20

## 2015-08-07 MED ORDER — IBUPROFEN 800 MG PO TABS
800.0000 mg | ORAL_TABLET | Freq: Three times a day (TID) | ORAL | Status: DC | PRN
Start: 2015-08-07 — End: 2015-08-20

## 2015-08-07 MED ORDER — CYCLOBENZAPRINE HCL 10 MG PO TABS
10.0000 mg | ORAL_TABLET | Freq: Three times a day (TID) | ORAL | Status: DC | PRN
Start: 2015-08-07 — End: 2015-08-20

## 2015-08-07 NOTE — Discharge Instr - Activity (Signed)
As tolerated

## 2015-08-07 NOTE — Discharge Summary (Signed)
[facility]      Patient: [name] [name]  Admission Date: 08/06/2015   DOB: 12-20-1965  Discharge Date: 08/07/2015    MRN: [id number]  Discharge Attending:Liliann File  M Add Dinapoli     Referring Physician: Christa See, MD  PCP: Christa See, MD       DISCHARGE SUMMARY     Discharge Information   Admission Diagnosis:   Costochondral pain    Discharge Diagnosis:   Active Hospital Problems    Diagnosis   . Costochondral pain   . Chest pain, unspecified type   . Prediabetes   . Nausea & vomiting   . Substance abuse   . Cocaine abuse        Admission Condition: stable  Discharge Condition: stabl3  Consultants: cardiology  Functional Status: as tolerated limit weight bearing  Discharged to: home    Discharge Medications:     Medication List      START taking these medications          cyclobenzaprine 10 MG tablet   Commonly known as:  FLEXERIL   Take 1 tablet (10 mg total) by mouth 3 (three) times daily as needed for Muscle spasms.       ibuprofen 800 MG tablet   Commonly known as:  ADVIL,MOTRIN   Take 1 tablet (800 mg total) by mouth every 8 (eight) hours as needed for Pain.         CONTINUE taking these medications          esomeprazole 20 MG capsule   Commonly known as:  NexIUM   Take 1 capsule (20 mg total) by mouth every morning before breakfast.       promethazine 25 MG tablet   Commonly known as:  PHENERGAN   Take 0.5-1 tablets (12.5-25 mg total) by mouth every 6 (six) hours as needed for Nausea.         STOP taking these medications          traMADol 50 MG tablet   Commonly known as:  ULTRAM            Where to Get Your Medications      You can get these medications from any pharmacy     Bring a paper prescription for each of these medications    - cyclobenzaprine 10 MG tablet  - esomeprazole 20 MG capsule  - ibuprofen 800 MG tablet              Hospital Course   Presentation History     [name] [name] is a 50 y.o. male with a PMHx of drug abuse- heroin and cocaine, tobacco abuse who presented with chest pain  that started 3 days ago. Pt reports to constant cp that began when he was getting a physical done. He was seen here in the ED a few days ago and eloped. Pt report cp to be constant, located in midsternal region, non radiating, describes it as someone sitting on his chest and constricting his breathing. Pt reports to cocaine and heroin abuse, last abuse was 5 days ago and used only heroin. He denies any increased physical activity or regular running however admitted to being physically active on daily basis a few days ago noted in ED provider notes. He reports no nausea, last episode of emesis was 3 days ago. Reports subjective fevers. He denies any alcohol use.     See HPI for details.    Hospital Course (1 Days)  Given prediabetes, smoking history, cocaine, gestault 2-3 Heart score. Have ordered stress test and echo with ESR CRP to assess for possible pericarditis component vs GERD vs chostrochontritis. Underwent stress testing negative. Echo with normal EF mild MR. ESR CRP and ddimer were negative. Notably had pain on palpation of the sternum without evidence of fever or WBC to suggest infectious process. HR was stable.     [name] home with instructions to take ibuprofen and flexeril and follow up with PCP for the pain and prediabetes.     Procedures/Imaging:   Chest 2 Views    08/03/2015   No active cardiopulmonary disease.    Heron Nay, MD 08/03/2015 5:37 PM     Nm Myocardial Perfusion Spect (stress And Rest)    08/06/2015      The above interpretation is as per the stressing physician. See separate stress sheet for details. INTERPRETATION: The overall quality of the study is good. Attenuation artifact is not noted. The left ventricle is normal in size. Pharmacologic stress SPECT images reveal normal radiotracer uptake of the anterior wall, lateral wall, septal wall and inferior wall. There is no reversible defect suggest ischemia. The gated wall motion study reveals normal wall motion. The left ventricular  systolic function normal The calculated left ventricular ejection fraction is 67%. CONCLUSION: 1. Negative pharmacologic stress and rest myocardial perfusion study   . 2. Normal left ventricular systolic function with an ejection fraction of 67%. 3. No myocardial ischemia is seen. [name], MD 08/06/2015 3:44 PM     Chest Ap Portable    08/06/2015  No acute finding. [name], MD 08/06/2015 3:02 AM     Cv Cardiac Stress Test Tracing Only    08/06/2015  1. Await nuclear imaging 2. No complications with regadenoson 3. Negative Lexiscan stress test [name], MD 08/06/2015 12:43 PM            Best Practices   Was the patient admitted with either a CHF Exacerbation or Pneumonia? n     Progress Note/Physical Exam at Discharge     Subjective: continues to have pain on palpation of the sternum. adivised that it will continue but should improve over time.     Filed Vitals:    08/06/15 1835 08/06/15 2300 08/07/15 0500 08/07/15 0708   BP: 142/84 138/75 141/71 126/78   Pulse: 73 75 75 63   Temp: 98.3 F (36.8 C) 98.6 F (37 C) 98.3 F (36.8 C) 98.2 F (36.8 C)   TempSrc:    Oral   Resp: 17 16 17 16    Height:       Weight:   73.8 kg (162 lb 11.2 oz)    SpO2: 99% 99% 99% 99%         General: NAD, AAOx3  HEENT: perrla, eomi, sclera anicteric, OP: Clear, MMM  Neck: supple, FROM, no LAD  Cardiovascular: RRR, no m/r/g. Pain on palpation of the sternum and xyphoid process.   Lungs: CTAB, no w/r/r  Abdomen: soft, +BS, NT/ND, no masses, no g/r  Extremities: no C/C/E  Skin: no rashes or lesions noted  Neuro: CN 2-12 intact; No Focal neurological deficits       Diagnostics     Labs/Studies Pending at Discharge: No    Last Labs     Recent Labs  Lab 08/07/15  0458 08/06/15  0241 08/03/15  1640   WBC 6.70 6.29 7.06   RBC 3.90* 3.79* 3.44*   HGB 12.2* 11.7*  10.7*   HEMATOCRIT 35.2* 34.5* 31.4*   MCV 90.3 91.0 91.3   PLATELETS 180 179 162         Recent Labs  Lab 08/07/15  0458 08/06/15  0240 08/03/15  1640   SODIUM 139 137  135*   POTASSIUM 4.0 4.1 4.1   CHLORIDE 106 103 102   CO2 27 27 31*   BUN 9.0 10.0 12.0   CREATININE 1.0 1.0 0.9   GLUCOSE 99 121* 110*   CALCIUM 9.3 9.2 9.1   MAGNESIUM 1.9  --   --              Microbiology Results     None           Patient Instructions   Discharge Diet: cardiac limit sugar  Discharge Activity: as tolerated limit weight lifting    Follow Up Appointment:  Follow-up Information     Follow up with Pcp, Noneorunknown, MD.           Time spent examining patient, discussing with patient/family regarding hospital course, chart review, reconciling medications and discharge planning: 60 minutes.    Patrcia Dolly    3:50 PM 08/07/2015

## 2015-08-07 NOTE — Plan of Care (Signed)
Problem: Safety  Goal: Patient will be free from injury during hospitalization  Outcome: Progressing  Alert and oriented x 4, anxious about his condition, encouraged to verbalize feelings and concerns, low fall risk, bed in low position, call bell and belongings within reach and hourly rounding is maintained, will continue to monitor.    Problem: Chest Pain  Goal: Vital signs and cardiac rhythm stable  Outcome: Progressing  Stable vs, O2 sat is 99% on RA, no shortness of breath noted, still complains of chest pain when breathing, all cardiac workup is negative, chest pain is related to musculoskeletal rather than cardiac, tolerated diet well, no nausea/vomiting, no BM thus far, will be discharged home today. Will follow plan of care.  .   Intervention: Report to LIP changes from baseline.  .  Intervention: On Room Air (or pre-admission oxygen therapy baseline) within 3 hours of admission.  Marland Kitchen

## 2015-08-07 NOTE — Progress Notes (Signed)
Pt is discharged home alone, all belongings taken from the room. IV removed, discharge and follow up instructions explained, pt verbalis ed understanding.  Wheelchair transport requested.

## 2015-08-07 NOTE — Progress Notes (Signed)
Pt medically cleared for D/C. DCP: home  GoodRx given.  TCM appt initiated. TCM clinic contact address and phone# given.  Pt has a ride     08/07/15 1419   Discharge Disposition   Patient preference/choice provided? Yes   Physical Discharge Disposition Home, No Needs   Mode of Transportation Public Transit   Patient/Family/POA notified of transfer plan Yes   Patient agreeable to discharge plan/expected d/c date? Yes   Family/POA agreeable to discharge plan/expected d/c date? Yes   Bedside nurse notified of transport plan? Yes   CM Interventions   Follow up appointment scheduled? Yes   Follow up appointment scheduled with: [facility]   Referral made for home health RN visit? Does not meet home bound criteria   Multidisciplinary rounds/family meeting before d/c? Yes   Medicare Checklist   Is this a Medicare patient? No     Sandi Raveling, BSN, RN  Clinical Case Manager I  [facility]  [address]  [place], [place] [address]  [phone]

## 2015-08-07 NOTE — Discharge Instructions (Signed)
Dear. Mr. Hemmelgarn,     Thank you for choosing [facility] for your emergency care needs. We strive to provide EXCELLENT care to you and your family.     In an effort to explain clearly of why you were here in the hospital, I've also written a very brief summary below. Other details including formal diagnosis, medication changes and follow up appointment recommendations can also be found in this packet.       You were admitted for chest pain due to musculoskeletal inflammation for which you were started flexeril and ibuprofen. Stress test of heart and heart enzymes were negative and you have low risk for clot given your labs and no signs of inflammation. I am continuing your acid reflux meds. Make sure to also follow up with our [facility]  for follow-up. I cannot stress the importance of follow up enough.     New medications: ibuprofen and flexeril. Continue nexium     Finally, as your discharging physician, you may be receiving a survey which is regarding my care. I would greatly value and appreciate your input in the survey as we strive for excellence.    Respectfully yours,    Kathe Mariner, MD      Noncardiac Chest Pain    Based on your visit today, the health care provider doesn't know what is causing your chest pain. In most cases, people who come to the emergency department with chest pain don't have a problem with their heart. Instead, the pain is caused by other conditions. These may be problems with the lungs, muscles, bones, digestive tract, nerves, or mental health.  Lung problems   Inflammation around the lungs (pleurisy)   Collapsed lung (pneumothorax)   Fluid around the lungs (pleural effusion)   Lung cancer. This is a rare cause of chest pain.  Muscle or bone problems   Inflamed cartilage between the ribs (pleurisy)   Fibromyalgia   Rheumatoid arthritis  Digestive system problems   Reflux   Stomach ulcer   Spasms of the esophagus   Gall stones   Gallbladder  inflammation  Mental health conditions   Panic or anxiety attacks   Emotional distress  Your condition doesn't seem serious and your pain doesn't appear to be coming from your heart. But sometimes the signs of a serious problem take more time to appear. Watch for the warning signs listed below.  Home care  Follow these guidelines when caring for yourself at home:   Rest today and avoid strenuous activity.   Take any prescribed medicine as directed.  Follow-up care  Follow up with your health care provider, or as advised, if you don't start to feel better within 24 hours.  When to seek medical advice  Call your health care provider right away if any of these occur:   A change in the type of pain. Call if it feels different, becomes more serious, lasts longer, or begins to spread into your shoulder, arm, neck, jaw, or back.   Shortness of breath   You feel more pain when you breathe   Cough with dark-colored mucus or blood   Weakness, dizziness, or fainting   Fever of 100.62F (38C) or higher, or as directed by your health care provider   Swelling, pain, or redness in one leg  Date Last Reviewed: 01/19/2013   2000-2016 [employer] [address] [place], [place] [address]. All rights reserved. This information is not intended as a substitute  for professional medical care. Always follow your healthcare professional's instructions.          Chest Wall Pain: Costochondritis    The chest pain that you have had today is caused by costochondritis. This condition is caused by an inflammation of the cartilage joining your ribs to your breastbone. It is not caused by heart or lung problems.The inflammation may have been brought on by a blow to the chest, lifting heavy objects, intense exercise, or an illness that made you cough and sneeze. Itoften occursduring times of emotional stress.It can be painful, but it is not dangerous. It usually goes away in 1 to 2 weeks. But it may happen again.  Rarely, a more serious condition may cause symptoms similar to costochondritis. That's why it's important to watch for the warning signs listed below.  Home care  Follow these guidelines when caring for yourself at home:   If you feel that emotional stress is a cause of your condition, try to figure out the sources of that stress. It may not be obvious! Learn ways to deal with the stress in your life. This can include regular exercise, muscle relaxation, meditation, or simply taking time out for yourself. For more information about this, talk with your health care provider. Or go to your [employer] and look at books on "stress reduction."   You may use acetaminophen or ibuprofen to control pain, unless another pain medicine was prescribed. If you have liver disease or ever had a stomach ulcer, talk with your health care provider before using these medicines.   You can also help ease pain by using a hot, wet compress or heating pad. Use this with or without a medicated skin cream that helps relieves pain.   Do stretching exercise as advised by your provider.   Take any prescribed medicines as directed.  Follow-up care  Follow up with your health care provider, or as advised, if you do not start to get better in the next 2 days.  When to seek medical advice  Call your health care provider right away if any of these occur:   A change in the type of pain. Call if it feels different, becomes more serious, lasts longer, or spreads into your shoulder, arm, neck, jaw, or back.   Shortness of breath or pain gets worse when you breathe   Weakness, dizziness, or fainting   Cough with dark-colored sputum (phlegm) or blood   Abdominal pain   Dark red or black stools   Fever of 100.76F (38C) or higher, or as directed by your health care provider  Date Last Reviewed: 01/19/2013   2000-2016 [employer] [address] [place], [place] [address]. All rights reserved. This information is not intended as  a substitute for professional medical care. Always follow your healthcare professional's instructions.

## 2015-08-07 NOTE — UM Notes (Signed)
Self Pay    [facility] (Order [id number]) written 08/07/2015 02:31PM    Additional History: [name] [name] is a 50 y.o. male presenting to the ED with c/o SOB since 2 days ago. Pt reports CP(tightness), n/v. Pt reports having a physical exam done a few days ago and was told his EKG "looked terrible" and sent to the ED. Pt smokes tobacco. Pt denies any lifting injury from exercising. Pt denies cough or rhinorrhea.     EKG:  Interpreted by the EP.  Time Interpreted: 208  Rate: 58  Rhythm: Sinus Bradycardia   Interpretation: No ST elevation. T wave inversions in V5, V6.   Comparison: Sinus bradycardia is new and T wave inversions in V5, V6 are new. T wave flattening in lead II.    Clinical Impression:   1. Chest pain, unspecified type                  T 99.3, HR 50, RR 14, BP 147/85, H/H 11.7/34.5, Glucose 121,     In ED  Toradol IV, ASA PO, Sublimaze IV    [facility] (Order [id number])  IVF at 100cc/hr, Zofran PO Q6 PRN, Zofan IV Q6 PRN, Lovenox SQ daily, Xylocaine mouth/throat, Maalox Plus once, Protonix PO daily, Flexeril PO 3xday PRN, Advil PO Q8 PRN, cardiac monitoring,

## 2015-08-07 NOTE — Discharge Instr - Diet (Signed)
Regular, as tolerated

## 2015-08-08 ENCOUNTER — Encounter (INDEPENDENT_AMBULATORY_CARE_PROVIDER_SITE_OTHER): Payer: Self-pay

## 2015-08-08 NOTE — Progress Notes (Signed)
[facility]    Received a referral to schedule a follow up appointment with the [facility].  Left patient a voicemail and provided main clinic phone number.  Requested patient return call to schedule a follow up appointment as soon as possible.       [name] [name]  Transitional Services   Sched Reg Rep II  T [phone]  F [phone]

## 2015-08-10 ENCOUNTER — Emergency Department
Admission: EM | Admit: 2015-08-10 | Discharge: 2015-08-11 | Disposition: A | Discharge status: Home / Self Care | Payer: Self-pay | Attending: Emergency Medicine | Admitting: Emergency Medicine

## 2015-08-10 ENCOUNTER — Emergency Department: Payer: Self-pay

## 2015-08-10 DIAGNOSIS — R519 Headache, unspecified: Secondary | ICD-10-CM

## 2015-08-10 DIAGNOSIS — R51 Headache: Secondary | ICD-10-CM | POA: Insufficient documentation

## 2015-08-10 DIAGNOSIS — R0789 Other chest pain: Secondary | ICD-10-CM | POA: Insufficient documentation

## 2015-08-10 NOTE — ED Notes (Signed)
Pain in chest since last week described as intermittent, aching. Also c/o R side tension headache with pulsating sensation x 40 min. No meds taken.

## 2015-08-11 ENCOUNTER — Emergency Department: Payer: Self-pay

## 2015-08-11 DIAGNOSIS — R0789 Other chest pain: Secondary | ICD-10-CM

## 2015-08-11 LAB — ECG 12-LEAD
Atrial Rate: 69 {beats}/min
P Axis: 71 degrees
P-R Interval: 150 ms
Q-T Interval: 396 ms
QRS Duration: 86 ms
QTC Calculation (Bezet): 424 ms
R Axis: 30 degrees
T Axis: 22 degrees
Ventricular Rate: 69 {beats}/min

## 2015-08-11 LAB — CBC AND DIFFERENTIAL
Basophils Absolute Automated: 0.03 10*3/uL (ref 0.00–0.20)
Basophils Automated: 0.3 %
Eosinophils Absolute Automated: 0.45 10*3/uL (ref 0.00–0.70)
Eosinophils Automated: 5.2 %
Hematocrit: 31.8 % — ABNORMAL LOW (ref 42.0–52.0)
Hgb: 10.8 g/dL — ABNORMAL LOW (ref 13.0–17.0)
Immature Granulocytes Absolute: 0.02 10*3/uL
Immature Granulocytes: 0.2 %
Lymphocytes Absolute Automated: 2.99 10*3/uL (ref 0.50–4.40)
Lymphocytes Automated: 34.6 %
MCH: 30.6 pg (ref 28.0–32.0)
MCHC: 34 g/dL (ref 32.0–36.0)
MCV: 90.1 fL (ref 80.0–100.0)
MPV: 9.8 fL (ref 9.4–12.3)
Monocytes Absolute Automated: 0.69 10*3/uL (ref 0.00–1.20)
Monocytes: 8 %
Neutrophils Absolute: 4.47 10*3/uL (ref 1.80–8.10)
Neutrophils: 51.9 %
Nucleated RBC: 1 /100 WBC (ref 0.0–1.0)
Platelets: 165 10*3/uL (ref 140–400)
RBC: 3.53 10*6/uL — ABNORMAL LOW (ref 4.70–6.00)
RDW: 13 % (ref 12–15)
WBC: 8.63 10*3/uL (ref 3.50–10.80)

## 2015-08-11 LAB — BASIC METABOLIC PANEL
Anion Gap: 9 (ref 5.0–15.0)
BUN: 20 mg/dL (ref 9.0–28.0)
CO2: 25 mEq/L (ref 22–29)
Calcium: 9.1 mg/dL (ref 8.5–10.5)
Chloride: 103 mEq/L (ref 100–111)
Creatinine: 1 mg/dL (ref 0.7–1.3)
Glucose: 100 mg/dL (ref 70–100)
Potassium: 4.1 mEq/L (ref 3.5–5.1)
Sodium: 137 mEq/L (ref 136–145)

## 2015-08-11 LAB — GFR: EGFR: >60

## 2015-08-11 LAB — HEMOLYSIS INDEX: Hemolysis Index: 13 (ref 0–18)

## 2015-08-11 LAB — TROPONIN I: Troponin I: <0.01 ng/mL (ref 0.00–0.09)

## 2015-08-11 MED ORDER — NAPROXEN 500 MG PO TABS
500.0000 mg | ORAL_TABLET | Freq: Two times a day (BID) | ORAL | Status: DC
Start: 2015-08-11 — End: 2015-08-20

## 2015-08-11 NOTE — ED Provider Notes (Signed)
Physician/Midlevel provider first contact with patient: 08/11/15 0036         Pt presents with right side headache that started after he drank a protein shake tonight.  No trauma.  No vomiting.  Pt states he has been having intermittent chest discomfort as well radiating to the left shoulder.  Pt has not taken any meds for this.  Pt had recent stress test which was negative. Hx from the pt.    Review of Systems   Constitutional: Negative.    Respiratory: Negative.    Cardiovascular: Positive for chest pain.   Gastrointestinal: Negative.    Neurological: Positive for headaches.   All other systems reviewed and are negative.    Physical Exam   Constitutional: He is oriented to person, place, and time. He appears well-developed and well-nourished. No distress.   HENT:   Head: Normocephalic and atraumatic.   Neck: Normal range of motion. Neck supple.   Cardiovascular: Normal rate and regular rhythm.    Pulmonary/Chest: Effort normal and breath sounds normal.   Musculoskeletal: Normal range of motion. He exhibits no edema.   Neurological: He is alert and oriented to person, place, and time.   Nursing note and vitals reviewed.    Oxygen saturations are 99%    EKG: normal sinus rhythm, nl axis, nl intervals, no ischemic changes.    Cardiac monitor: NSR rate 80s    2:57 AM  Discussed results.  Pt with stress test this past week.  Do not suspect ACS.    Impression:  1. Atypical chest pain    2. Nonintractable episodic headache, unspecified headache type          Jethro Bastos, MD  08/11/15 [phone]

## 2015-08-11 NOTE — Discharge Instructions (Signed)
Headache     You have been treated for a headache.     Headaches are very common. Most of the time they are benign (not harmful). Some headaches can be very serious. Your headache appears to be benign. The doctor feels it is OK for you to go home.     If you continue to have headaches, or if this headache does not resolve over the next few days, you should be evaluated by your regular doctor or a neurologist. Keep a “headache diary.” This may help your doctor learn the cause of your headaches.     Take your headache medication as directed. This is especially important if your doctor has placed you on a daily medication to prevent headaches.     YOU SHOULD SEEK MEDICAL ATTENTION IMMEDIATELY, EITHER HERE OR AT THE NEAREST EMERGENCY DEPARTMENT, IF ANY OF THE FOLLOWING OCCURS:  · Your headache gets worse.  · You have a severe headache that occurs suddenly.  · Your head pain is different from your normal headache.  · You have a fever (temperature higher than 100.4ºF / 38ºC), especially with a stiff neck.  · You feel numbness, tingling, or weakness in your arms or legs.  · You pass out.  · You have problems with your vision.  · You vomit and have trouble taking medication or keeping it down.              Chest Pain of Unclear Etiology     You have been seen for chest pain. The cause of your pain is not yet known.     Your doctor has learned about your medical history, examined you, and checked any tests that were done. Still, it is unclear why you are having pain. The doctor thinks there is only a very small chance that your pain is caused by a life-threatening condition. Later, your primary care doctor might do more tests or check you again.     Sometimes chest pain is caused by a dangerous condition, like a heart attack, aorta injury, blood clot in the lung, or collapsed lung. It is unlikely that your pain is caused by a life-threatening condition if: Your chest pain lasts only a few seconds at a time; you are not short  of breath, nauseated (sick to your stomach), sweaty, or lightheaded; your pain gets worse when you twist or bend; your pain improves with exercise or hard work.     Chest pain is serious. It is VERY IMPORTANT that you follow up with your regular doctor and seek medical attention immediately here or at the nearest Emergency Department if your symptoms become worse or they change.     YOU SHOULD SEEK MEDICAL ATTENTION IMMEDIATELY, EITHER HERE OR AT THE NEAREST EMERGENCY DEPARTMENT, IF ANY OF THE FOLLOWING OCCURS:  · Your pain gets worse.  · Your pain makes you short of breath, nauseated, or sweaty.  · Your pain gets worse when you walk, go up stairs, or exert yourself.  · You feel weak, lightheaded, or faint.  · It hurts to breathe.  · Your leg swells.  · Your symptoms get worse or you have new symptoms or concerns.

## 2015-08-20 ENCOUNTER — Emergency Department: Payer: Self-pay

## 2015-08-20 ENCOUNTER — Emergency Department: Payer: Medicaid Other

## 2015-08-20 ENCOUNTER — Emergency Department
Admission: EM | Admit: 2015-08-20 | Discharge: 2015-08-20 | Disposition: A | Discharge status: Home / Self Care | Payer: Self-pay | Attending: Emergency Medicine | Admitting: Emergency Medicine

## 2015-08-20 DIAGNOSIS — Z87442 Personal history of urinary calculi: Secondary | ICD-10-CM | POA: Insufficient documentation

## 2015-08-20 DIAGNOSIS — R079 Chest pain, unspecified: Secondary | ICD-10-CM | POA: Insufficient documentation

## 2015-08-20 DIAGNOSIS — Z87898 Personal history of other specified conditions: Secondary | ICD-10-CM | POA: Insufficient documentation

## 2015-08-20 DIAGNOSIS — F17210 Nicotine dependence, cigarettes, uncomplicated: Secondary | ICD-10-CM | POA: Insufficient documentation

## 2015-08-20 DIAGNOSIS — R7303 Prediabetes: Secondary | ICD-10-CM | POA: Insufficient documentation

## 2015-08-20 LAB — CBC AND DIFFERENTIAL
Basophils Absolute Automated: 0.03 10*3/uL (ref 0.00–0.20)
Basophils Automated: 0.4 %
Eosinophils Absolute Automated: 0.43 10*3/uL (ref 0.00–0.70)
Eosinophils Automated: 5.7 %
Hematocrit: 31 % — ABNORMAL LOW (ref 42.0–52.0)
Hgb: 10.4 g/dL — ABNORMAL LOW (ref 13.0–17.0)
Immature Granulocytes Absolute: 0.01 10*3/uL
Immature Granulocytes: 0.1 %
Lymphocytes Absolute Automated: 2.82 10*3/uL (ref 0.50–4.40)
Lymphocytes Automated: 37.4 %
MCH: 31.3 pg (ref 28.0–32.0)
MCHC: 33.5 g/dL (ref 32.0–36.0)
MCV: 93.4 fL (ref 80.0–100.0)
MPV: 9.8 fL (ref 9.4–12.3)
Monocytes Absolute Automated: 0.67 10*3/uL (ref 0.00–1.20)
Monocytes: 8.9 %
Neutrophils Absolute: 3.6 10*3/uL (ref 1.80–8.10)
Neutrophils: 47.6 %
Nucleated RBC: 0 /100 WBC (ref 0.0–1.0)
Platelets: 162 10*3/uL (ref 140–400)
RBC: 3.32 10*6/uL — ABNORMAL LOW (ref 4.70–6.00)
RDW: 14 % (ref 12–15)
WBC: 7.55 10*3/uL (ref 3.50–10.80)

## 2015-08-20 LAB — COMPREHENSIVE METABOLIC PANEL
ALT: 14 U/L (ref 0–55)
AST (SGOT): 23 U/L (ref 5–34)
Albumin/Globulin Ratio: 1.5 (ref 0.9–2.2)
Albumin: 3.8 g/dL (ref 3.5–5.0)
Alkaline Phosphatase: 56 U/L (ref 38–106)
Anion Gap: 8 (ref 5.0–15.0)
BUN: 12 mg/dL (ref 9–28)
Bilirubin, Total: 0.4 mg/dL (ref 0.2–1.2)
CO2: 29 mEq/L (ref 22–29)
Calcium: 8.8 mg/dL (ref 8.5–10.5)
Chloride: 102 mEq/L (ref 100–111)
Creatinine: 0.8 mg/dL (ref 0.7–1.3)
Globulin: 2.5 g/dL (ref 2.0–3.6)
Glucose: 98 mg/dL (ref 70–100)
Potassium: 4 mEq/L (ref 3.5–5.1)
Protein, Total: 6.3 g/dL (ref 6.0–8.3)
Sodium: 139 mEq/L (ref 136–145)

## 2015-08-20 LAB — IHS D-DIMER: D-Dimer: 0.47 ug/mL FEU (ref 0.00–0.51)

## 2015-08-20 LAB — RAPID DRUG SCREEN, URINE
Barbiturate Screen, UR: NEGATIVE
Benzodiazepine Screen, UR: NEGATIVE
Cannabinoid Screen, UR: NEGATIVE
Cocaine, UR: NEGATIVE
Opiate Screen, UR: POSITIVE — AB
PCP Screen, UR: NEGATIVE
Urine Amphetamine Screen: NEGATIVE

## 2015-08-20 LAB — TROPONIN I
Troponin I: <0.01 ng/mL (ref 0.00–0.09)
Troponin I: <0.01 ng/mL (ref 0.00–0.09)

## 2015-08-20 LAB — GFR: EGFR: >60

## 2015-08-20 MED ORDER — SODIUM CHLORIDE 0.9 % IV BOLUS
1000.0000 mL | Freq: Once | INTRAVENOUS | Status: AC
Start: 2015-08-20 — End: 2015-08-20
  Administered 2015-08-20: 1000 mL via INTRAVENOUS

## 2015-08-20 MED ORDER — MORPHINE SULFATE 4 MG/ML IJ/IV SOLN (WRAP)
4.0000 mg | Freq: Once | Status: AC
Start: 2015-08-20 — End: 2015-08-20
  Administered 2015-08-20: 4 mg via INTRAVENOUS
  Filled 2015-08-20 (×2): qty 1

## 2015-08-20 NOTE — Discharge Instructions (Signed)
Chest Pain of Unclear Etiology     You have been seen for chest pain. The cause of your pain is not yet known.     Your doctor has learned about your medical history, examined you, and checked any tests that were done. Still, it is unclear why you are having pain. The doctor thinks there is only a very small chance that your pain is caused by a life-threatening condition. Later, your primary care doctor might do more tests or check you again.     Sometimes chest pain is caused by a dangerous condition, like a heart attack, aorta injury, blood clot in the lung, or collapsed lung. It is unlikely that your pain is caused by a life-threatening condition if: Your chest pain lasts only a few seconds at a time; you are not short of breath, nauseated (sick to your stomach), sweaty, or lightheaded; your pain gets worse when you twist or bend; your pain improves with exercise or hard work.     Chest pain is serious. It is VERY IMPORTANT that you follow up with your regular doctor and seek medical attention immediately here or at the nearest Emergency Department if your symptoms become worse or they change.     YOU SHOULD SEEK MEDICAL ATTENTION IMMEDIATELY, EITHER HERE OR AT THE NEAREST EMERGENCY DEPARTMENT, IF ANY OF THE FOLLOWING OCCURS:  · Your pain gets worse.  · Your pain makes you short of breath, nauseated, or sweaty.  · Your pain gets worse when you walk, go up stairs, or exert yourself.  · You feel weak, lightheaded, or faint.  · It hurts to breathe.  · Your leg swells.  · Your symptoms get worse or you have new symptoms or concerns.

## 2015-08-20 NOTE — ED Notes (Signed)
Pt complained of center chest pain that went to the left side and down his left arm. Pt stated that his arm feels funny. Sensory was tested by running finger along back side of the hand and pt stated that the left side felt different. Pt was asked to smile and raise his eye brows face was symmetrical. Morphine was held earlier after discussing with MD that pt easily falling asleep while talking to him. In light of new pain onset it was advised to give previosuly held Morphine.

## 2015-08-20 NOTE — ED Provider Notes (Signed)
Physician/Midlevel provider first contact with patient: 08/20/15 8657         History     Chief Complaint   Patient presents with   . Chest Pain     HPI   50 yo male with hx of drug abuse.  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 No ivda hx.  With recent hospital admission for chest pain in the past month with negative NM stress test.   Presents with 30 min of midsternal pressured chest pain with sob. Constant with no radiation of pain.  No exacerbating factors. Pt denies other complaints.  Moderate in severity. Pleuritic. Similar to past chest pain this month.   Pt denies abdominal pain, nausea, vomiting, diarrhea and extremity pain.  No cough.   Pt has pre-diabetes.   Pt denies hx of htn /  smoking /cad / fhx of cad / hx of MI / Hx of dvt or pe / travel / recent surgery         Past Medical History   Diagnosis Date   . Heart murmur    . Gallstones    . Gastritis    . Renal calculus        History reviewed. No pertinent past surgical history.    Family History   Problem Relation Age of Onset   . Diabetes Mother    . Hypertension Mother        Social  Social History   Substance Use Topics   . Smoking status: Current Some Day Smoker -- 1.00 packs/day for .5 years     Types: Cigarettes   . Smokeless tobacco: None   . Alcohol Use: No      Comment: pt claims he "is not a drinker", admits to ETOH use tonight       .     Allergies   Allergen Reactions   . Gluten      Pt states "white bread"   . Percocet [Oxycodone-Acetaminophen]    . Tylenol [Acetaminophen] Hives     Tyelnol#3, hives and throw up       Home Medications                                                                           Review of Systems   Constitutional: Negative for fever and chills.   HENT: Negative for sore throat.    Eyes: Negative for visual disturbance.   Respiratory: Positive for shortness of breath. Negative for cough.    Cardiovascular: Positive for chest pain.   Gastrointestinal: Negative for nausea, vomiting,  abdominal pain and diarrhea.   Endocrine: Negative for polyuria.   Genitourinary: Negative for flank pain.   Musculoskeletal: Negative for back pain, arthralgias, gait problem, neck pain and neck stiffness.   Skin: Negative for rash.   Allergic/Immunologic: Negative for immunocompromised state.   Neurological: Negative for weakness, numbness and headaches.   Hematological: Does not bruise/bleed easily.   Psychiatric/Behavioral: Negative for agitation.       Physical Exam    BP: 126/72 mmHg, Heart Rate: 60, Temp: 98.3 F (36.8 C), Resp Rate: 18, SpO2: 98 %, Weight: 74.844 kg    Physical Exam  Constitutional: He is oriented to person, place, and time. He appears well-developed and well-nourished. No distress.   HENT:   Head: Normocephalic and atraumatic.   Eyes: EOM are normal.   Neck: Neck supple.   Cardiovascular: Normal rate, regular rhythm and normal heart sounds.  Exam reveals no gallop and no friction rub.    No murmur heard.  Pulmonary/Chest: Effort normal and breath sounds normal. No respiratory distress. He has no wheezes. He has no rales. He exhibits tenderness.   Abdominal: Soft. Bowel sounds are normal. He exhibits no distension. There is no tenderness. There is no rebound and no guarding.   Musculoskeletal: Normal range of motion. He exhibits no edema or tenderness.   No calf swelling, tenderness or edema    Neurological: He is alert and oriented to person, place, and time. No cranial nerve deficit. Coordination normal.   Skin: Skin is warm and dry. No rash noted. No erythema.   Psychiatric: He has a normal mood and affect.         MDM and ED Course     ED Medication Orders     Start Ordered     Status Ordering Provider    08/20/15 [phone] 08/20/15 0815  morphine injection 4 mg   Once     Route: Intravenous  Ordered Dose: 4 mg     Last MAR action:  Given Damani Kelemen S    08/20/15 0816 08/20/15 0815  sodium chloride 0.9 % bolus 1,000 mL   Once     Route: Intravenous  Ordered Dose: 1,000 mL     Last  MAR action:  Stopped Eleftheria Taborn S             MDM      Heart Score         Value    History  1    EKG  0    Risk Factors  1    Total (with age)  3    Onset of pain (time of START of last episode of chest pain)?  0-3 hrs ago    Timing of repeat Troponin/EKG order  3 hours after first troponin      Wells/PERC Score         Value    Wells Score     Previous DVT or PE  0    HR greater than 100  0    Surgery within 4 weeks, or Immobilization in last 3 days  0    Clinical Signs/Symptoms of DVT  0    Alternative diagnosis less likely than PE  0    Hemoptysis  0    Malignancy with treatment within 6 months or palliative care  0    Wells Score for PE  0    Wells Rule Risk  Low Risk: <2 points (Continue to [facility] Calculator)    PERC Score     Age (read only)  less than or equal to 50 years    HR >= 100  0    O2 Sat on Room Air < 95%  0    Prior History of Venous Thromboembolism  0    Trauma or Surgery within 4 weeks  0    Hemoptysis  0    Exogenous Estrogen  0    Unilateral Leg Swelling  0    PERC Rule Score (Calculated)  1    PERC Rule Risk  PERC rule is not satisfied and cannot be used to rule  out PE.          Procedures  ekg - nsr with no acute st abnormalities, nml intervals and axis. Interpretation by myself.    Clinical Impression & Disposition     Clinical Impression  Final diagnoses:   Chest pain, unspecified type    50 yo male with chest pain, hx shows negative recent stress test.   Will do ekg and troponin to assess for ischemia. Will do dimer for pe. Will do drug screen for cocaine.   Will do cbc to assess for anemia and infection.  Will do bmp to assess for electrolyte abnormalities and kidney function.   Will do cxr for infiltrate.   Morphine for pain - has not yet taken methadone today per pt.       Lab work shows no acute abnormalities. Dimer negative  cxr negative  Case reviewed with dr. Willaim Bane.  Stated appropriate for outpt f/u with trop x 2 neg    Trop x 2 negative.   Pt [place] with primary care f/u and  cardiology f/u.     ED Disposition     Discharge Jerone Cudmore Kreiser discharge to home/self care.    Condition at disposition: Stable             There are no discharge medications for this patient.                  Daylene Posey, MD  08/20/15 2024

## 2015-08-23 LAB — ECG 12-LEAD
Atrial Rate: 60 {beats}/min
P Axis: 61 degrees
P-R Interval: 148 ms
Q-T Interval: 426 ms
QRS Duration: 92 ms
QTC Calculation (Bezet): 426 ms
R Axis: -25 degrees
T Axis: -9 degrees
Ventricular Rate: 60 {beats}/min

## 2015-10-30 ENCOUNTER — Emergency Department: Payer: Medicaid Other

## 2015-10-30 ENCOUNTER — Emergency Department
Admission: EM | Admit: 2015-10-30 | Discharge: 2015-10-30 | Disposition: A | Discharge status: Home / Self Care | Payer: Medicaid Other | Attending: Emergency Medicine | Admitting: Emergency Medicine

## 2015-10-30 DIAGNOSIS — F17210 Nicotine dependence, cigarettes, uncomplicated: Secondary | ICD-10-CM | POA: Insufficient documentation

## 2015-10-30 DIAGNOSIS — Z87442 Personal history of urinary calculi: Secondary | ICD-10-CM | POA: Insufficient documentation

## 2015-10-30 DIAGNOSIS — R0789 Other chest pain: Secondary | ICD-10-CM | POA: Insufficient documentation

## 2015-10-30 LAB — COMPREHENSIVE METABOLIC PANEL
ALT: 13 U/L (ref 0–55)
AST (SGOT): 17 U/L (ref 5–34)
Albumin/Globulin Ratio: 1.2 (ref 0.9–2.2)
Albumin: 3.4 g/dL — ABNORMAL LOW (ref 3.5–5.0)
Alkaline Phosphatase: 65 U/L (ref 38–106)
Anion Gap: 6 (ref 5.0–15.0)
BUN: 12 mg/dL (ref 9.0–28.0)
Bilirubin, Total: 0.6 mg/dL (ref 0.2–1.2)
CO2: 27 mEq/L (ref 22–29)
Calcium: 9.3 mg/dL (ref 8.5–10.5)
Chloride: 103 mEq/L (ref 100–111)
Creatinine: 0.8 mg/dL (ref 0.7–1.3)
Globulin: 2.9 g/dL (ref 2.0–3.6)
Glucose: 101 mg/dL — ABNORMAL HIGH (ref 70–100)
Potassium: 4 mEq/L (ref 3.5–5.1)
Protein, Total: 6.3 g/dL (ref 6.0–8.3)
Sodium: 136 mEq/L (ref 136–145)

## 2015-10-30 LAB — CBC AND DIFFERENTIAL
Absolute NRBC: 0 10*3/uL
Basophils Absolute Automated: 0.04 10*3/uL (ref 0.00–0.20)
Basophils Automated: 0.6 %
Eosinophils Absolute Automated: 0.29 10*3/uL (ref 0.00–0.70)
Eosinophils Automated: 4.6 %
Hematocrit: 33.4 % — ABNORMAL LOW (ref 42.0–52.0)
Hgb: 11.3 g/dL — ABNORMAL LOW (ref 13.0–17.0)
Immature Granulocytes Absolute: 0.01 10*3/uL
Immature Granulocytes: 0.2 %
Lymphocytes Absolute Automated: 1.78 10*3/uL (ref 0.50–4.40)
Lymphocytes Automated: 28 %
MCH: 31.7 pg (ref 28.0–32.0)
MCHC: 33.8 g/dL (ref 32.0–36.0)
MCV: 93.6 fL (ref 80.0–100.0)
MPV: 10 fL (ref 9.4–12.3)
Monocytes Absolute Automated: 0.75 10*3/uL (ref 0.00–1.20)
Monocytes: 11.8 %
Neutrophils Absolute: 3.48 10*3/uL (ref 1.80–8.10)
Neutrophils: 54.8 %
Nucleated RBC: 0 /100 WBC (ref 0.0–1.0)
Platelets: 158 10*3/uL (ref 140–400)
RBC: 3.57 10*6/uL — ABNORMAL LOW (ref 4.70–6.00)
RDW: 13 % (ref 12–15)
WBC: 6.35 10*3/uL (ref 3.50–10.80)

## 2015-10-30 LAB — RAPID DRUG SCREEN, URINE
Barbiturate Screen, UR: NEGATIVE
Benzodiazepine Screen, UR: NEGATIVE
Cannabinoid Screen, UR: NEGATIVE
Cocaine, UR: NEGATIVE
Opiate Screen, UR: POSITIVE — AB
PCP Screen, UR: NEGATIVE
Urine Amphetamine Screen: NEGATIVE

## 2015-10-30 LAB — GFR: EGFR: >60

## 2015-10-30 LAB — ECG 12-LEAD
Atrial Rate: 67 {beats}/min
P Axis: 61 degrees
P-R Interval: 154 ms
Q-T Interval: 382 ms
QRS Duration: 86 ms
QTC Calculation (Bezet): 403 ms
R Axis: -12 degrees
T Axis: -30 degrees
Ventricular Rate: 67 {beats}/min

## 2015-10-30 LAB — HEMOLYSIS INDEX: Hemolysis Index: 12 (ref 0–18)

## 2015-10-30 LAB — TROPONIN I: Troponin I: <0.01 ng/mL (ref 0.00–0.09)

## 2015-10-30 MED ORDER — KETOROLAC TROMETHAMINE 30 MG/ML IJ SOLN
30.0000 mg | Freq: Once | INTRAMUSCULAR | Status: AC
Start: 2015-10-30 — End: 2015-10-30
  Administered 2015-10-30: 30 mg via INTRAVENOUS
  Filled 2015-10-30: qty 1

## 2015-10-30 NOTE — Special Discharge Instructions (Signed)
Return for recurrent CP or any other concerns.

## 2015-10-30 NOTE — Discharge Instructions (Signed)
Chest Pain of Unclear Etiology     You have been seen for chest pain. The cause of your pain is not yet known.     Your doctor has learned about your medical history, examined you, and checked any tests that were done. Still, it is unclear why you are having pain. The doctor thinks there is only a very small chance that your pain is caused by a life-threatening condition. Later, your primary care doctor might do more tests or check you again.     Sometimes chest pain is caused by a dangerous condition, like a heart attack, aorta injury, blood clot in the lung, or collapsed lung. It is unlikely that your pain is caused by a life-threatening condition if: Your chest pain lasts only a few seconds at a time; you are not short of breath, nauseated (sick to your stomach), sweaty, or lightheaded; your pain gets worse when you twist or bend; your pain improves with exercise or hard work.     Chest pain is serious. It is VERY IMPORTANT that you follow up with your regular doctor and seek medical attention immediately here or at the nearest Emergency Department if your symptoms become worse or they change.     YOU SHOULD SEEK MEDICAL ATTENTION IMMEDIATELY, EITHER HERE OR AT THE NEAREST EMERGENCY DEPARTMENT, IF ANY OF THE FOLLOWING OCCURS:  · Your pain gets worse.  · Your pain makes you short of breath, nauseated, or sweaty.  · Your pain gets worse when you walk, go up stairs, or exert yourself.  · You feel weak, lightheaded, or faint.  · It hurts to breathe.  · Your leg swells.  · Your symptoms get worse or you have new symptoms or concerns.

## 2015-10-30 NOTE — ED Notes (Signed)
Pt reports chest pain starting about 1 hour ago in the left chest. He said the pain at one point radiated to his armpit but the pain subsided quickly.

## 2015-10-30 NOTE — ED Provider Notes (Signed)
EMERGENCY DEPARTMENT HISTORY AND PHYSICAL EXAM     Physician/Midlevel provider first contact with patient: 10/30/15 0815         Date: 10/30/2015  Patient Name: [name] [name]    History of Presenting Illness     Chief Complaint   Patient presents with   . Chest Pain       History Provided By: Patient    Chief Complaint: Chest discomfort  Onset: 1 hour ago  Timing: Constant  Location: Left sided, Upper  Quality: Dull  Severity: Moderate  Exacerbating factors: None  Alleviating factors: None  Associated Symptoms: None  Pertinent Negatives: SOB, nausea, vomiting, diaphoresis, back pain, abd pain, leg swelling    Additional History: DEVLON [name] is a 50 y.o. male presenting to the ED with c/o chest discomfort that started about 1 hour ago. Pt reports the pain started after he ate breakfast this morning. He describes the pain as constant dull pain in the upper left side of his chest in which he rates a 6/10. He notes that he might have had a similar pain to this many years ago. He denies SOB, nausea, vomiting, diaphoresis, back pain, abd pain, leg swelling, or any other sxs. Pt states he smokes 1 pack of cigarettes per day. 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 Denies recent surgeries, long trips, or recent trauma.     PCP: Pcp, Noneorunknown, MD  SPECIALISTS:    No current facility-administered medications for this encounter.     No current outpatient prescriptions on file.       Past History     Past Medical History:  Past Medical History   Diagnosis Date   . Heart murmur    . Gallstones    . Gastritis    . Renal calculus    . Depression        Past Surgical History:  History reviewed. No pertinent past surgical history.    Family History:  Family History   Problem Relation Age of Onset   . Diabetes Mother    . Hypertension Mother        Social History:  Social History   Substance Use Topics   . Smoking status: Current Every Day Smoker -- 1.00 packs/day for .5  years     Types: Cigarettes   . Smokeless tobacco: None   . Alcohol Use: No      Comment: pt claims he "is not a drinker", admits to ETOH use tonight       Allergies:  Allergies   Allergen Reactions   . Gluten      Pt states "white bread"   . Percocet [Oxycodone-Acetaminophen]    . Tylenol [Acetaminophen] Hives     Tyelnol#3, hives and throw up       Review of Systems     Constitutional- No Fever  Eyes: no visual changes  Ear, Nose, Throat: No sore throat  Cardiovascular: Chest discomfort  Respiratory: No Shortness of breath  GI: No abdominal pain  Genitourinary: No dysuria  Musculoskeletal: No back pain  Skin: No rash  Neurologic:No headache   Hemo/Lymphatic: No easy bleeding  Psychiatric:no alcohol abuse    Physical Exam   BP 118/71 mmHg  Pulse 69  Temp(Src) 98.6 F (37 C) (Oral)  Resp 14  Ht 5\' 8"  (1.727 m)  Wt 77.111 kg  BMI 25.85 kg/m2  SpO2 98%    Constitutional: Vital signs  reviewed. Well appearing.  Head: Normocephalic, atraumatic  Eyes: Conjunctiva and sclera are normal.  No injection or discharge.  Ears, Nose, Throat:  Normal external examination of the nose and ears.    Neck: Normal range of motion. Trachea midline.  Respiratory/Chest: Clear to auscultation. No respiratory distress. Slight tenderness in left subclavicular region.    Cardiovascular: Regular rate and rhythm. No murmurs.  Abdomen:  No rebound or guarding. Soft.  Non-tender.  Back: no cva tenderness    Upper Extremity:  No edema. No cyanosis.  Lower Extremity:  No edema. No cyanosis.  Skin: Warm and dry. No rash.  Psychiatric:  Normal affect.    Neuro: intact.    Diagnostic Study Results     Labs -     Results     Procedure Component Value Units Date/Time    Troponin I [[id number]] Collected:  10/30/15 0837    Specimen Information:  Blood Updated:  10/30/15 0922     Troponin I <0.01 ng/mL     Comprehensive metabolic panel [[id number]]  (Abnormal) Collected:  10/30/15 0837    Specimen Information:  Blood Updated:  10/30/15 0915      Glucose 101 (H) mg/dL      BUN 78.2 mg/dL      Creatinine 0.8 mg/dL      Sodium 956 mEq/L      Potassium 4.0 mEq/L      Chloride 103 mEq/L      CO2 27 mEq/L      Calcium 9.3 mg/dL      Protein, Total 6.3 g/dL      Albumin 3.4 (L) g/dL      AST (SGOT) 17 U/L      ALT 13 U/L      Alkaline Phosphatase 65 U/L      Bilirubin, Total 0.6 mg/dL      Globulin 2.9 g/dL      Albumin/Globulin Ratio 1.2      Anion Gap 6.0     Hemolysis index [[id number]] Collected:  10/30/15 0837     Hemolysis Index 12 Updated:  10/30/15 0915    GFR [[id number]] Collected:  10/30/15 0837     EGFR >60.0 Updated:  10/30/15 0915    Urine Tox Screen (Rapid Drug Screen) [[id number]]  (Abnormal) Collected:  10/30/15 0836    Specimen Information:  Urine Updated:  10/30/15 0904     Amphetamine Screen, UR Negative      Barbiturate Screen, UR Negative      Benzodiazepine Screen, UR Negative      Cannabinoid Screen, UR Negative      Cocaine, UR Negative      Opiate Screen, UR Positive (A)      PCP Screen, UR Negative     CBC with differential [[id number]]  (Abnormal) Collected:  10/30/15 0837    Specimen Information:  Blood from Blood Updated:  10/30/15 0855     WBC 6.35 x10 3/uL      Hgb 11.3 (L) g/dL      Hematocrit 03.4 (L) %      Platelets 158 x10 3/uL      RBC 3.57 (L) x10 6/uL      MCV 93.6 fL      MCH 31.7 pg      MCHC 33.8 g/dL      RDW 13 %      MPV 10.0 fL      Neutrophils 54.8 %      Lymphocytes Automated 28.0 %  Monocytes 11.8 %      Eosinophils Automated 4.6 %      Basophils Automated 0.6 %      Immature Granulocyte 0.2 %      Nucleated RBC 0.0 /100 WBC      Neutrophils Absolute 3.48 x10 3/uL      Abs Lymph Automated 1.78 x10 3/uL      Abs Mono Automated 0.75 x10 3/uL      Abs Eos Automated 0.29 x10 3/uL      Absolute Baso Automated 0.04 x10 3/uL      Absolute Immature Granulocyte 0.01 x10 3/uL      Absolute NRBC 0.00 x10 3/uL           Radiologic Studies -   Radiology Results (24 Hour)     Procedure Component Value Units Date/Time    XR Chest   AP Portable [[id number]] Collected:  10/30/15 0846    Order Status:  Completed Updated:  10/30/15 0850    Narrative:      INDICATION: Chest pain, unspecified type    COMPARISON: 08/20/2015    FINDINGS:  A single radiograph of the chest performed. Portable film.  The heart is normal in size.   The mediastinal and hilar structures are within normal limits.  The lung Lieurance are clear. No pleural effusion.  No pneumothorax.    The  visualized osseous structures demonstrates no acute abnormality.      Impression:       No active disease    Kinnie Feil, MD   10/30/2015 8:46 AM        .    Medical Decision Making   I am the first provider for this patient.    I reviewed the vital signs, available nursing notes, past medical history, past surgical history, family history and social history.    Vital Signs-Reviewed the patient's vital signs.     Patient Vitals for the past 12 hrs:   BP Temp Pulse Resp   10/30/15 0814 118/71 mmHg 98.6 F (37 C) 69 14       Pulse Oximetry Analysis - Normal 98% on RA    Cardiac Monitor:  Rate: 69  Rhythm:  Normal Sinus Rhythm     EKG:  Interpreted by the EP.   Time Interpreted: 0811   Rate: 67   Rhythm: Normal Sinus Rhythm    Interpretation:Nonspecific T wave abnormality    Comparison: Compared to 08/20/2015, no significant change.     Clinical Decision Support:     Heart Score         Value    History  0    EKG  1    Risk Factors  1    Total (with age)  3    Onset of pain (time of START of last episode of chest pain)?  0-3 hrs ago    Timing of repeat Troponin/EKG order  3 hours after first troponin          Old Medical Records: Old medical records. Nursing notes.   Negative nuclear stress test done in the last 80 days.    ED Course:     9:18 AM - Pt states he is feeling better and would like to go to work at today 10 AM. Discussed importance of the time-sensitive lab work. Pt agrees to stay for further lab work.     9:45 AM - Pt would not like to stay for 2nd blood work. Pain resolved. Will  sign AMA.    9:50 AM - Despite repeated attempts to convince this patient, of the need for additional blood work, I have been unable to do so.  The patient is alert, oriented and capable of making decisions for him/her self.  I have advised the patient that he/she should return immediately if worse.    Medical Decision Making:    50 year old male with hx of recurrent CP, now with atypical CP. Hx for cardiac ischemia sxs. Recent negative nuclear stress test and EKG no significant change from previous. No suspicion or risk factor for PE dissection or other vascular pathology. Heart score is 3. Will get troponin x2 and will reassess.     Diagnosis     Clinical Impression:   1. Atypical chest pain        Treatment Plan:   ED Disposition     AMA Date: 10/30/2015  Patient: SAQUAN FURTICK  Admitted: 10/30/2015  8:15 AM  Attending Provider: Arville Care, MD    [name] [name] or his authorized caregiver has made the decision for the patient to leave the emergency department against the advice of his attending physician. He or his authorized caregiver has been informed and understands the inherent risks, including death, .  He or his authorized caregiver has decided to accept the responsibility for this decision. Jaquille Kau Ragas and all necessary parties have been advised that he may return for further evaluation or treatment. His condition at time of discharge was stable.  Sheran Fava Barreiro had current vital signs as follows:  BP 118/71 Pulse 69 Temp 98.6 F (37 C) Temp Src: Oral Resp 14 Ht 5\' 8"  (1.727 m) Wt 77.111 kg              _______________________________      Attestations: This note is prepared by Gaylene Brooks, acting as scribe for Tawanna Sat, MD.    Tawanna Sat, MD - The scribe's documentation has been prepared under my direction and personally reviewed by me in its entirety.  I confirm that the note above accurately reflects all work, treatment, procedures, and medical decision making performed by  me.    _______________________________    Arville Care, MD  10/30/15 2237

## 2015-10-31 ENCOUNTER — Telehealth: Payer: Self-pay

## 2016-02-24 ENCOUNTER — Emergency Department: Payer: Self-pay

## 2016-02-24 ENCOUNTER — Emergency Department
Admission: EM | Admit: 2016-02-24 | Discharge: 2016-02-24 | Disposition: A | Discharge status: Home / Self Care | Payer: Self-pay | Attending: Emergency Medicine | Admitting: Emergency Medicine

## 2016-02-24 DIAGNOSIS — R600 Localized edema: Secondary | ICD-10-CM | POA: Insufficient documentation

## 2016-02-24 DIAGNOSIS — R079 Chest pain, unspecified: Secondary | ICD-10-CM | POA: Insufficient documentation

## 2016-02-24 LAB — COMPREHENSIVE METABOLIC PANEL
ALT: 17 U/L (ref 0–55)
AST (SGOT): 24 U/L (ref 5–34)
Albumin/Globulin Ratio: 0.9 (ref 0.9–2.2)
Albumin: 3.4 g/dL — ABNORMAL LOW (ref 3.5–5.0)
Alkaline Phosphatase: 83 U/L (ref 38–106)
Anion Gap: 9 (ref 5.0–15.0)
BUN: 11 mg/dL (ref 9.0–28.0)
Bilirubin, Total: 0.5 mg/dL (ref 0.2–1.2)
CO2: 29 mEq/L (ref 22–29)
Calcium: 9.4 mg/dL (ref 8.5–10.5)
Chloride: 98 mEq/L — ABNORMAL LOW (ref 100–111)
Creatinine: 0.8 mg/dL (ref 0.7–1.3)
Globulin: 3.7 g/dL — ABNORMAL HIGH (ref 2.0–3.6)
Glucose: 119 mg/dL — ABNORMAL HIGH (ref 70–100)
Potassium: 4.3 mEq/L (ref 3.5–5.1)
Protein, Total: 7.1 g/dL (ref 6.0–8.3)
Sodium: 136 mEq/L (ref 136–145)

## 2016-02-24 LAB — CBC AND DIFFERENTIAL
Absolute NRBC: 0 10*3/uL
Basophils Absolute Automated: 0.04 10*3/uL (ref 0.00–0.20)
Basophils Automated: 0.4 %
Eosinophils Absolute Automated: 0.29 10*3/uL (ref 0.00–0.70)
Eosinophils Automated: 3.1 %
Hematocrit: 33.2 % — ABNORMAL LOW (ref 42.0–52.0)
Hgb: 11.2 g/dL — ABNORMAL LOW (ref 13.0–17.0)
Immature Granulocytes Absolute: 0.03 10*3/uL
Immature Granulocytes: 0.3 %
Lymphocytes Absolute Automated: 2.1 10*3/uL (ref 0.50–4.40)
Lymphocytes Automated: 22.3 %
MCH: 30.9 pg (ref 28.0–32.0)
MCHC: 33.7 g/dL (ref 32.0–36.0)
MCV: 91.7 fL (ref 80.0–100.0)
MPV: 10.5 fL (ref 9.4–12.3)
Monocytes Absolute Automated: 1.09 10*3/uL (ref 0.00–1.20)
Monocytes: 11.6 %
Neutrophils Absolute: 5.88 10*3/uL (ref 1.80–8.10)
Neutrophils: 62.3 %
Nucleated RBC: 0 /100 WBC (ref 0.0–1.0)
Platelets: 174 10*3/uL (ref 140–400)
RBC: 3.62 10*6/uL — ABNORMAL LOW (ref 4.70–6.00)
RDW: 12 % (ref 12–15)
WBC: 9.43 10*3/uL (ref 3.50–10.80)

## 2016-02-24 LAB — TROPONIN I
Troponin I: <0.01 ng/mL (ref 0.00–0.09)
Troponin I: <0.01 ng/mL (ref 0.00–0.09)

## 2016-02-24 LAB — GFR: EGFR: >60

## 2016-02-24 LAB — IHS D-DIMER: D-Dimer: 0.4 ug/mL FEU (ref 0.00–0.51)

## 2016-02-24 LAB — HEMOLYSIS INDEX: Hemolysis Index: 50 — ABNORMAL HIGH (ref 0–18)

## 2016-02-24 NOTE — Discharge Instructions (Signed)
LE Edema Etiology Unknown    You have been seen today for swelling of your leg(s).    Despite the doctor's evaluation, the cause of the swelling is unknown.    There are many possible causes for leg swelling. Below is a description of some of these causes:   Deep Venous Thrombus (DVT): This is a blood clot in one of the deep veins of your leg. Symptoms can include leg pain and swelling. The diagnosis is often made with a leg ultrasound which can detect a blockage in the veins.   Congestive heart failure is a condition where some fluid builds up in the lungs because of a heart problem. The main symptom is shortness of breath which worsens when you lie down. You may also notice leg swelling and weight gain.   Cellulitis: This is a bacterial infection of the skin. Symptoms are usually redness, swelling, and warmth in the affected area. Some people get a fever (temperature higher than 100.57F / 38C) with this infection.   Venous Stasis: This happens when blood pools in the legs for some time. The legs get swollen and sometimes get red. The swelling gets better at night because lying flat makes it easier for blood to return to the heart. During the day as a person stands or sits with their legs dangling down, the blood has trouble getting out of the legs again and swelling gets worse.   Low amounts of Albumin: Albumin is a type of protein that is carried in the blood stream. One thing it does is keep the fluid part of the blood from leaking out of the blood vessels and into the surrounding tissues. Low albumin can be from poor nutrition, alcoholism, liver disease or other chronic (ongoing) illnesses.    The exact cause of the swelling is not known even though tests may have been done here today. Even though the cause is not known, the doctor feels that it is OK for you to go home. You need to see your doctor or referral doctor for more tests.    YOU SHOULD SEEK MEDICAL ATTENTION IMMEDIATELY, EITHER HERE OR AT  THE NEAREST EMERGENCY DEPARTMENT, IF ANY OF THE FOLLOWING OCCURS:   Your leg swelling gets worse.   Your legs get red and you have pain / fever (temperature higher than 100.57F / 38C).   You develop any shortness of breath, chest pain or pressure over your heart.   You develop any shortness of breath, especially if there is pain in your chest when you breathe in.               Chest Pain of Unclear Etiology    You have been seen for chest pain. The cause of your pain is not yet known.    Your doctor has learned about your medical history, examined you, and checked any tests that were done. Still, it is unclear why you are having pain. The doctor thinks there is only a very small chance that your pain is caused by a life-threatening condition. Later, your primary care doctor might do more tests or check you again.    Sometimes chest pain is caused by a dangerous condition, like a heart attack, aorta injury, blood clot in the lung, or collapsed lung. It is unlikely that your pain is caused by a life-threatening condition if: Your chest pain lasts only a few seconds at a time; you are not short of breath, nauseated (sick to your stomach), sweaty, or  lightheaded; your pain gets worse when you twist or bend; your pain improves with exercise or hard work.    Chest pain is serious. It is VERY IMPORTANT that you follow up with your regular doctor and seek medical attention immediately here or at the nearest Emergency Department if your symptoms become worse or they change.    YOU SHOULD SEEK MEDICAL ATTENTION IMMEDIATELY, EITHER HERE OR AT THE NEAREST EMERGENCY DEPARTMENT, IF ANY OF THE FOLLOWING OCCURS:   Your pain gets worse.   Your pain makes you short of breath, nauseated, or sweaty.   Your pain gets worse when you walk, go up stairs, or exert yourself.   You feel weak, lightheaded, or faint.   It hurts to breathe.   Your leg swells.   Your symptoms get worse or you have new symptoms or concerns.

## 2016-02-24 NOTE — ED Provider Notes (Signed)
EMERGENCY DEPARTMENT HISTORY AND PHYSICAL EXAM     Physician/Midlevel provider first contact with patient: 02/24/16 1839         Date: 02/24/2016  Patient Name: [name] [name]    History of Presenting Illness     Chief Complaint   Patient presents with   . Shortness of Breath   . Leg Swelling       History Provided By: Patient    Chief Complaint: Chest pain  Duration: 1 hour prior to arrival  Timing:  Constant  Quality: pressure  Severity: Moderate  Exacerbating factors: None  Alleviating factors: None  Associated Symptoms: Leg swelling, left hip pain  Pertinent Negatives: No fever, chills, cough    Additional History: [name] [name] is a 50 y.o. male with hx of a heart murmur and anemia presenting to the ED with chest pressure that began 1 hour prior to arrival. Patient also complains of increased leg swelling for the past week and left hip pain. He denies any fever, chills, or cough. Patient states he was sitting in his car when his pain began. Patient has been an active heroin user for the past year and last used heroin earlier today. He smokes cigarettes.     PCP: Pcp, Noneorunknown, MD  SPECIALISTS:    No current facility-administered medications for this encounter.      No current outpatient prescriptions on file.       Past History     Past Medical History:  Past Medical History:   Diagnosis Date   . Depression    . Gallstones    . Gastritis    . Heart murmur    . Renal calculus        Past Surgical History:  History reviewed. No pertinent surgical history.    Family History:  Family History   Problem Relation Age of Onset   . Diabetes Mother    . Hypertension Mother        Social History:  Social History   Substance Use Topics   . Smoking status: Current Every Day Smoker     Packs/day: 1.00     Years: 0.50     Types: Cigarettes   . Smokeless tobacco: Never Used   . Alcohol use Yes      Comment: occasionally       Allergies:  Allergies   Allergen Reactions   . Gluten      Pt states "white bread"   . Percocet  [Oxycodone-Acetaminophen]    . Tylenol [Acetaminophen] Hives     Tyelnol#3, hives and throw up       Review of Systems     Constitutional: Negative for fever or chills.   Neurological: Negative for speech changes, weakness, or numbness.  Eyes: Negative for visual changes or eye pain.  HENT: No headache. Negative for sore throat, neck pain, or runny nose.   Cardiovascular: Chest pain.   Respiratory: Negative for shortness of breath.   Gastrointestinal: Negative for abdominal pain, nausea, vomiting, diarrhea, or blood in stool.   Genitourinary: Negative for dysuria or hematuria.  Musculoskeletal: Negative for gait changes, joint pain or muscle pain. Bilateral leg swelling. Left hip pain.   Skin: Negative for itching or rash.   Hematological: Negative for easy bruising      Physical Exam   BP 121/73   Pulse 79   Temp 98.4 F (36.9 C) (Oral)   Resp 16   Ht 5\' 9"  (1.753 m)   Wt 84.8  kg   SpO2 97%   BMI 27.62 kg/m     Physical Exam   Constitutional: Oriented to person, place, and time  and in no distress. Appears fatigued and disheveled.   Head: Normocephalic and atraumatic.   Mouth/Throat: Oropharynx is clear and moist. Dry mucous membranes.   Eyes: Conjunctivae normal and EOM are normal. Pupils are equal, round, and reactive to light.    Neck: Normal range of motion. Neck supple.   Cardiovascular: Normal rate, regular rhythm, normal heart sounds and intact distal pulses.  No murmur heard.   Pulmonary/Chest: Effort normal and breath sounds normal. Lungs clear.   Abdominal: Soft. Non distended. Non tender. No rebound or guarding  Musculoskeletal: No peripheral edema. No calf swelling or tenderness.  Bilateral lower extremity edema.   Neurological: Patient is alert and oriented to person, place, and time. No cranial nerve deficit.  GCS score is 15.   Skin: Skin is warm and dry. No rash  Psychiatric: Affect normal.     Diagnostic Study Results     Labs -     Results     Procedure Component Value Units Date/Time     Troponin I [[id number]] Collected:  02/24/16 2240    Specimen:  Blood Updated:  02/24/16 2313     Troponin I <0.01 ng/mL     Troponin I [[id number]] Collected:  02/24/16 1923    Specimen:  Blood Updated:  02/24/16 2016     Troponin I <0.01 ng/mL     GFR [[id number]] Collected:  02/24/16 1923     Updated:  02/24/16 2007     EGFR >60.0    Comprehensive metabolic panel [[id number]]  (Abnormal) Collected:  02/24/16 1923    Specimen:  Blood Updated:  02/24/16 2007     Glucose 119 (H) mg/dL      BUN 62.9 mg/dL      Creatinine 0.8 mg/dL      Sodium 528 mEq/L      Potassium 4.3 mEq/L      Chloride 98 (L) mEq/L      CO2 29 mEq/L      Calcium 9.4 mg/dL      Protein, Total 7.1 g/dL      Albumin 3.4 (L) g/dL      AST (SGOT) 24 U/L      ALT 17 U/L      Alkaline Phosphatase 83 U/L      Bilirubin, Total 0.5 mg/dL      Globulin 3.7 (H) g/dL      Albumin/Globulin Ratio 0.9     Anion Gap 9.0    Hemolysis index [[id number]]  (Abnormal) Collected:  02/24/16 1923     Updated:  02/24/16 2007     Hemolysis Index 50 (H)    D-Dimer [[id number]] Collected:  02/24/16 1923     Updated:  02/24/16 1956     D-Dimer 0.40 ug/mL FEU     CBC with differential [[id number]]  (Abnormal) Collected:  02/24/16 1923    Specimen:  Blood from Blood Updated:  02/24/16 1947     WBC 9.43 x10 3/uL      Hgb 11.2 (L) g/dL      Hematocrit 63.8 (L) %      Platelets 174 x10 3/uL      RBC 3.62 (L) x10 6/uL      MCV 91.7 fL      MCH 30.9 pg      MCHC 33.7 g/dL      RDW 12 %  MPV 10.5 fL      Neutrophils 62.3 %      Lymphocytes Automated 22.3 %      Monocytes 11.6 %      Eosinophils Automated 3.1 %      Basophils Automated 0.4 %      Immature Granulocyte 0.3 %      Nucleated RBC 0.0 /100 WBC      Neutrophils Absolute 5.88 x10 3/uL      Abs Lymph Automated 2.10 x10 3/uL      Abs Mono Automated 1.09 x10 3/uL      Abs Eos Automated 0.29 x10 3/uL      Absolute Baso Automated 0.04 x10 3/uL      Absolute Immature Granulocyte 0.03 x10 3/uL      Absolute NRBC 0.00 x10 3/uL            Radiologic Studies -   Radiology Results (24 Hour)     Procedure Component Value Units Date/Time    [place] VenoDopp Low Extremity Bilateral [[id number]] Collected:  02/24/16 2000    Order Status:  Completed Updated:  02/24/16 2005    Narrative:       INDICATION: Lower extremity edema. Shortness of breath. Left-sided chest  pain.    TECHNIQUE: Grey scale and color doppler imaging with spectral analysis  was performed on the deep veins of both lower extremities.     FINDINGS:  The veins are widely patent and show no intraluminal  thrombus.  There is normal compressibility with the sonographic  transducer.  Doppler spectral analysis shows normal phasicity and  augmentation.      Impression:         No evidence of deep venous thrombosis.    Aquilla Hacker, MD   02/24/2016 8:01 PM    Chest AP Portable [[id number]] Collected:  02/24/16 1901    Order Status:  Completed Updated:  02/24/16 1906    Narrative:       INDICATION: Acute chest pain    TECHNIQUE: An AP portable radiograph of the chest was obtained.     COMPARISON: 10/30/2015    FINDINGS:  The cardiac silhouette is within normal limits. There is mild  linear scarring or discoid atelectasis in the right lung base. The lungs  are otherwise clear.  There is no evidence of pleural effusion or  pneumothorax.      Impression:        Mild right basilar scarring versus atelectasis. Otherwise,  negative examination.     Aquilla Hacker, MD   02/24/2016 7:02 PM      .    Medical Decision Making   I am the first provider for this patient.    I reviewed the vital signs, available nursing notes, past medical history, past surgical history, family history and social history.    Vital Signs-Reviewed the patient's vital signs.     Patient Vitals for the past 12 hrs:   BP Temp Pulse Resp   02/24/16 2235 121/73 98.4 F (36.9 C) 79 16   02/24/16 2036 118/70 98.6 F (37 C) 82 15   02/24/16 1825 142/89 99 F (37.2 C) 91 20       Pulse Oximetry Analysis - Normal 97% on RA    Cardiac  Monitor:  Rate: 97  Rhythm:  Normal Sinus Rhythm     EKG:  Interpreted by the EP.   Time Interpreted: 1821   Rate: 91   Rhythm: Normal Sinus Rhythm  Interpretation:Nonspecific T wave changes, normal PR, normal QRS.    Comparison: T wave depression seen on prior in 10/2015    Repeat EKG:  Interpreted by the EP.   Time Interpreted: 2222   Rate: 77   Rhythm: Normal Sinus Rhythm    Interpretation: Nonspecific T wave changes   Comparison: No change compared to prior EKG at 18:21    Old Medical Records: Nursing notes.     ED Course:   9:14 PM - Informed patient of lab results.   11:38 PM - Discussed lab results with pt and counseled on diagnosis, f/u plans, and signs and symptoms when to return to ED.  Pt is stable and ready for discharge.       Provider Notes:  This patient with chest pain does not have a clinical presentation highly concerning for acute myocardial infarction or unstable angina, the EKG does not show a new ischemic abnormality, the troponin is negative, there are no alternative diagnoses necessitating hospital admission, there are no identified barriers to outpatient followup, the HEART score is 0-3, and the troponin was drawn 0-3 hours after onset of chest pain.  Therefore, per the [facility] Chest Pain Pathway, the EKG and troponin will be repeated 3 hours after the initial troponin.  If the EKG is unchanged and the troponin is negative, this patient is at very low risk for ACS within 30 days and is safe for discharge.    Diagnosis     Clinical Impression:   1. Leg edema    2. Chest pain, unspecified type        Treatment Plan:   ED Disposition     ED Disposition Condition Date/Time Comment    Discharge  Fri Feb 24, 2016 11:39 PM [name] [name] discharge to home/self care.    Condition at disposition: Stable            _______________________________      Attestations: This note is prepared by Jaymes Graff, acting as scribe for Dr. Audley Hose, MD.    Dr. Audley Hose, MD - The scribe's documentation has  been prepared under my direction and personally reviewed by me in its entirety.  I confirm that the note above accurately reflects all work, treatment, procedures, and medical decision making performed by me.    _______________________________       Cherlyn Roberts, MD  03/03/16 [phone]

## 2016-02-24 NOTE — ED Triage Notes (Signed)
SOB, L sided chest pain described as "something pressing down on my chest" x30 minutes.  States that his legs have been swelling and he has been having to sleep sitting up gradually getting worse over the past month.

## 2016-02-26 LAB — ECG 12-LEAD
Atrial Rate: 91 {beats}/min
Atrial Rate: 77 {beats}/min
P Axis: 40 degrees
P Axis: 35 degrees
P-R Interval: 142 ms
P-R Interval: 150 ms
Q-T Interval: 360 ms
Q-T Interval: 386 ms
QRS Duration: 90 ms
QRS Duration: 90 ms
QTC Calculation (Bezet): 442 ms
QTC Calculation (Bezet): 436 ms
R Axis: 72 degrees
R Axis: 202 degrees
T Axis: -21 degrees
T Axis: -19 degrees
Ventricular Rate: 91 {beats}/min
Ventricular Rate: 77 {beats}/min

## 2016-02-27 ENCOUNTER — Emergency Department
Admission: EM | Admit: 2016-02-27 | Discharge: 2016-02-27 | Disposition: A | Discharge status: Home / Self Care | Payer: Charity | Attending: Emergency Medicine | Admitting: Emergency Medicine

## 2016-02-27 ENCOUNTER — Emergency Department: Payer: Self-pay

## 2016-02-27 DIAGNOSIS — F17210 Nicotine dependence, cigarettes, uncomplicated: Secondary | ICD-10-CM | POA: Insufficient documentation

## 2016-02-27 DIAGNOSIS — R0602 Shortness of breath: Secondary | ICD-10-CM | POA: Insufficient documentation

## 2016-02-27 LAB — COOXIMETRY PROFILE
Carboxyhemoglobin: 7.2 % — ABNORMAL HIGH (ref 0.0–3.0)
Hematocrit Total Calculated: 38.4 % — ABNORMAL LOW (ref 40.0–54.0)
Hemoglobin Total: 12.5 g/dL — ABNORMAL LOW (ref 13.0–17.0)
Methemoglobin: 0.5 % (ref 0.0–3.0)
O2 Content: 15.8
Oxygenated Hemoglobin: 89.5 % (ref 85.0–98.0)

## 2016-02-27 NOTE — Discharge Instructions (Signed)
Shortness of Breath, Unclear Etiology    You have been seen today for shortness of breath, or difficulty breathing; but we don't know the cause.    This means that after talking about your medical problems, doing a physical exam, and looking at the tests that were done, there is still not a clear explanation as to why you are short of breath. You may need to go see your doctor for another exam or more tests to find out why you are short of breath. .    CAUTION: Even after a workup, including EKGs, lab tests, x-rays and even CAT Scans (CT scans), it is still possible to have a serious problem that cannot be detected at first.    The doctor caring for you feels that the cause of your shortness of breath is not from a life-threatening problem.    Some of the more dangerous medical problems such as a heart attack, blood clot in the lung (pulmonary embolism), asthma, collapsed lung, heart failure, etc. have been looked at, but are not felt to be the cause of your shortness of breath.    Shortness of breath is a serious symptom and must be handled carefully. It is VERY IMPORTANT that you see your regular doctor and return for re-evaluation or seek medical attention immediately if your symptoms become worse or change.    YOU SHOULD SEEK MEDICAL ATTENTION IMMEDIATELY, EITHER HERE OR AT THE NEAREST EMERGENCY DEPARTMENT, IF ANY OF THE FOLLOWING OCCURS:   Your shortness of breath returns.   You notice that your shortness of breath happens as you walk, go up stairs, or exert yourself.   If you develop chest pain, sweating, or nausea (feel sick to your stomach).   Rapid heartbeat.   You have any weakness, lightheadedness or you get so short of breath that you pass out.   It hurts to breathe.   Your leg swells.   Any other worsening symptoms or problems.   If you are unable to sleep because of shortness of breath, or you wake in the middle of the night short of breath.

## 2016-02-27 NOTE — ED Provider Notes (Signed)
Physician/Midlevel provider first contact with patient: 02/27/16 0413       Pt presents over concern that he inhaled incense tonight.  Pt was smoking in his car and his cigarette fell and apparently lit the incense.  Pt states that he may have been inhaling it for 30 minutes.  Hx from the pt.      Review of Systems   Constitutional: Negative.    Respiratory: Positive for cough. Negative for shortness of breath.    Cardiovascular: Negative.    Gastrointestinal: Negative.    All other systems reviewed and are negative.      Physical Exam   Constitutional: He appears well-developed and well-nourished. No distress.   HENT:   Head: Normocephalic and atraumatic.   Neck: Normal range of motion. Neck supple.   Cardiovascular: Normal rate and regular rhythm.    Pulmonary/Chest: Effort normal and breath sounds normal.   Neurological: He is alert. No cranial nerve deficit. Coordination normal.   Nursing note and vitals reviewed.    Oxygen saturations are 95%    EKG: normal sinus rhythm, left axis deviation, nl intervals, no ischemic changes.    6:23 AM  Pt resting comfortably.  No respiratory distress.  Discussed results.  Pt is a pack a day smoker. His CO levels are c/w chronic smoker.  I am not concerned about CO poisoning.    Impression:  1. Shortness of breath           Jethro Bastos, MD  02/27/16 [phone]

## 2016-02-27 NOTE — ED Notes (Signed)
Co-oximetry profile drawn and given to RT.

## 2016-02-27 NOTE — ED Triage Notes (Signed)
[name] [name] is a 51 y.o. male presenting to the ED with c/o shortness of breath after falling asleep, dropping the cigarette and the cigarette lighting incense and burning incense for unknown period of time. Patient was in the car, with windows up at time of incident. Patient in no distress at time of triage. BP 111/69   Pulse 86   Temp 98.6 F (37 C) (Oral)   Resp 14   Ht 5' 8.5" (1.74 m)   Wt 79.8 kg   SpO2 95%   BMI 26.37 kg/m

## 2016-02-28 ENCOUNTER — Emergency Department: Payer: Self-pay

## 2016-02-28 ENCOUNTER — Emergency Department
Admission: EM | Admit: 2016-02-28 | Discharge: 2016-02-28 | Disposition: A | Discharge status: Home / Self Care | Payer: Charity | Attending: Emergency Medicine | Admitting: Emergency Medicine

## 2016-02-28 ENCOUNTER — Emergency Department: Payer: Charity

## 2016-02-28 DIAGNOSIS — F1410 Cocaine abuse, uncomplicated: Secondary | ICD-10-CM | POA: Insufficient documentation

## 2016-02-28 DIAGNOSIS — F1110 Opioid abuse, uncomplicated: Secondary | ICD-10-CM | POA: Insufficient documentation

## 2016-02-28 DIAGNOSIS — F1910 Other psychoactive substance abuse, uncomplicated: Secondary | ICD-10-CM

## 2016-02-28 DIAGNOSIS — F17210 Nicotine dependence, cigarettes, uncomplicated: Secondary | ICD-10-CM | POA: Insufficient documentation

## 2016-02-28 DIAGNOSIS — R079 Chest pain, unspecified: Secondary | ICD-10-CM | POA: Insufficient documentation

## 2016-02-28 LAB — GFR: EGFR: >60

## 2016-02-28 LAB — CBC AND DIFFERENTIAL
Absolute NRBC: 0 10*3/uL
Basophils Absolute Automated: 0.05 10*3/uL (ref 0.00–0.20)
Basophils Automated: 0.7 %
Eosinophils Absolute Automated: 0.31 10*3/uL (ref 0.00–0.70)
Eosinophils Automated: 4.2 %
Hematocrit: 37.3 % — ABNORMAL LOW (ref 42.0–52.0)
Hgb: 12.6 g/dL — ABNORMAL LOW (ref 13.0–17.0)
Immature Granulocytes Absolute: 0.01 10*3/uL
Immature Granulocytes: 0.1 %
Lymphocytes Absolute Automated: 3.13 10*3/uL (ref 0.50–4.40)
Lymphocytes Automated: 42.2 %
MCH: 31.3 pg (ref 28.0–32.0)
MCHC: 33.8 g/dL (ref 32.0–36.0)
MCV: 92.6 fL (ref 80.0–100.0)
MPV: 9.6 fL (ref 9.4–12.3)
Monocytes Absolute Automated: 0.89 10*3/uL (ref 0.00–1.20)
Monocytes: 12 %
Neutrophils Absolute: 3.03 10*3/uL (ref 1.80–8.10)
Neutrophils: 40.8 %
Nucleated RBC: 0 /100 WBC (ref 0.0–1.0)
Platelets: 230 10*3/uL (ref 140–400)
RBC: 4.03 10*6/uL — ABNORMAL LOW (ref 4.70–6.00)
RDW: 12 % (ref 12–15)
WBC: 7.42 10*3/uL (ref 3.50–10.80)

## 2016-02-28 LAB — HEPATIC FUNCTION PANEL
ALT: 14 U/L (ref 0–55)
AST (SGOT): 23 U/L (ref 5–34)
Albumin/Globulin Ratio: 0.9 (ref 0.9–2.2)
Albumin: 3.3 g/dL — ABNORMAL LOW (ref 3.5–5.0)
Alkaline Phosphatase: 73 U/L (ref 38–106)
Bilirubin Direct: 0.1 mg/dL (ref 0.0–0.5)
Bilirubin Indirect: 0.3 mg/dL (ref 0.0–1.1)
Bilirubin, Total: 0.4 mg/dL (ref 0.2–1.2)
Globulin: 3.5 g/dL (ref 2.0–3.6)
Protein, Total: 6.8 g/dL (ref 6.0–8.3)

## 2016-02-28 LAB — BASIC METABOLIC PANEL
Anion Gap: 8 (ref 5.0–15.0)
BUN: 9 mg/dL (ref 9–28)
CO2: 30 mEq/L — ABNORMAL HIGH (ref 22–29)
Calcium: 9.4 mg/dL (ref 8.5–10.5)
Chloride: 98 mEq/L — ABNORMAL LOW (ref 100–111)
Creatinine: 0.8 mg/dL (ref 0.7–1.3)
Glucose: 108 mg/dL — ABNORMAL HIGH (ref 70–100)
Potassium: 4.3 mEq/L (ref 3.5–5.1)
Sodium: 136 mEq/L (ref 136–145)

## 2016-02-28 LAB — IHS D-DIMER: D-Dimer: 0.35 ug/mL FEU (ref 0.00–0.51)

## 2016-02-28 LAB — PT AND APTT
PT INR: 1 (ref 0.9–1.1)
PT: 13.1 s (ref 12.6–15.0)
PTT: 36 s (ref 23–37)

## 2016-02-28 LAB — TROPONIN I: Troponin I: <0.01 ng/mL (ref 0.00–0.09)

## 2016-02-28 LAB — B-TYPE NATRIURETIC PEPTIDE: B-Natriuretic Peptide: <10 pg/mL (ref 0–100)

## 2016-02-28 MED ORDER — IBUPROFEN 600 MG PO TABS
600.0000 mg | ORAL_TABLET | Freq: Four times a day (QID) | ORAL | 0 refills | Status: DC | PRN
Start: 2016-02-28 — End: 2016-04-17

## 2016-02-28 MED ORDER — KETOROLAC TROMETHAMINE 30 MG/ML IJ SOLN
30.0000 mg | Freq: Once | INTRAMUSCULAR | Status: AC
Start: 2016-02-28 — End: 2016-02-28
  Administered 2016-02-28: 30 mg via INTRAVENOUS
  Filled 2016-02-28: qty 1

## 2016-02-28 MED ORDER — FAMOTIDINE 10 MG/ML IV SOLN (WRAP)
20.0000 mg | Freq: Once | INTRAVENOUS | Status: AC
Start: 2016-02-28 — End: 2016-02-28
  Administered 2016-02-28: 20 mg via INTRAVENOUS
  Filled 2016-02-28: qty 2

## 2016-02-28 MED ORDER — LIDOCAINE VISCOUS 2 % MT SOLN
10.0000 mL | Freq: Once | OROMUCOSAL | Status: DC
Start: 2016-02-28 — End: 2016-02-28

## 2016-02-28 MED ORDER — ALUM & MAG HYDROXIDE-SIMETH 200-200-20 MG/5ML PO SUSP
30.0000 mL | Freq: Once | ORAL | Status: DC
Start: 2016-02-28 — End: 2016-02-28

## 2016-02-28 MED ORDER — FAMOTIDINE 20 MG PO TABS
20.0000 mg | ORAL_TABLET | Freq: Two times a day (BID) | ORAL | 0 refills | Status: AC
Start: 2016-02-28 — End: 2016-03-04

## 2016-02-28 NOTE — Discharge Instructions (Signed)
Chest Pain of Unclear Etiology     You have been seen for chest pain. The cause of your pain is not yet known.     Your doctor has learned about your medical history, examined you, and checked any tests that were done. Still, it is unclear why you are having pain. The doctor thinks there is only a very small chance that your pain is caused by a life-threatening condition. Later, your primary care doctor might do more tests or check you again.     Sometimes chest pain is caused by a dangerous condition, like a heart attack, aorta injury, blood clot in the lung, or collapsed lung. It is unlikely that your pain is caused by a life-threatening condition if: Your chest pain lasts only a few seconds at a time; you are not short of breath, nauseated (sick to your stomach), sweaty, or lightheaded; your pain gets worse when you twist or bend; your pain improves with exercise or hard work.     Chest pain is serious. It is VERY IMPORTANT that you follow up with your regular doctor and seek medical attention immediately here or at the nearest Emergency Department if your symptoms become worse or they change.     YOU SHOULD SEEK MEDICAL ATTENTION IMMEDIATELY, EITHER HERE OR AT THE NEAREST EMERGENCY DEPARTMENT, IF ANY OF THE FOLLOWING OCCURS:  · Your pain gets worse.  · Your pain makes you short of breath, nauseated, or sweaty.  · Your pain gets worse when you walk, go up stairs, or exert yourself.  · You feel weak, lightheaded, or faint.  · It hurts to breathe.  · Your leg swells.  · Your symptoms get worse or you have new symptoms or concerns.

## 2016-02-28 NOTE — ED Provider Notes (Signed)
EMERGENCY DEPARTMENT NOTE    Physician/Midlevel provider first contact with patient: 02/28/16 1548         HISTORY OF PRESENT ILLNESS   Historian:Patient  Translator Used: No    Chief Complaint: Chest Pain and Shortness of Breath     Mechanism of Injury:       51 y.o. male presents to the ED complaining of left sided chest pain and SOB.  Patient reports pain started 1 hour PTA.  Was seen in past for SOB but pain a lot worse today.  Pain worse with movement and deep breath.  No fevers.  Last used both heroin and cocaine today.      1. Location of symptoms: epigastic, L chest  2. Onset of symptoms: 1 hour PTA  3. What was patient doing when symptoms started (Context): see above  4. Severity: moderate  5. Timing: constant  6. Activities that worsen symptoms: deep breath, talking  7. Activities that improve symptoms: nothing  8. Quality: sharp  9. Radiation of symptoms: no  10. Associated signs and Symptoms: see above  11. Are symptoms worsening? yes  MEDICAL HISTORY     Past Medical History:  Past Medical History:   Diagnosis Date   . Depression    . Gallstones    . Gastritis    . Heart murmur    . Renal calculus        Past Surgical History:  History reviewed. No pertinent surgical history.    Social History:  Social History     Social History   . Marital status: Single     Spouse name: N/A   . Number of children: N/A   . Years of education: N/A     Occupational History   . Not on file.     Social History Main Topics   . Smoking status: Current Every Day Smoker     Packs/day: 1.00     Years: 0.50     Types: Cigarettes   . Smokeless tobacco: Never Used   . Alcohol use Yes      Comment: occasionally   . Drug use: Yes     Types: Marijuana, Cocaine   . Sexual activity: Not on file     Other Topics Concern   . Not on file     Social History Narrative   . No narrative on file       Family History:  Family History   Problem Relation Age of Onset   . Diabetes Mother    . Hypertension Mother        Outpatient  Medication:  Discharge Medication List as of 02/28/2016  6:29 PM            REVIEW OF SYSTEMS   Review of Systems   Constitutional: Negative.  Negative for chills and fever.   Respiratory: Positive for shortness of breath. Negative for cough.    Cardiovascular: Positive for chest pain and leg swelling.   Gastrointestinal: Negative.  Negative for abdominal pain, nausea and vomiting.   Neurological: Negative.  Negative for dizziness and headaches.   All other systems reviewed and are negative.           PHYSICAL EXAM     ED Triage Vitals [02/28/16 1517]   Enc Vitals Group      BP 148/89      Heart Rate 83      Resp Rate 18      Temp 98.3 F (36.8 C)  Temp Source Oral      SpO2 100 %      Weight 79.4 kg      Height 1.753 m      Head Circumference       Peak Flow       Pain Score 6      Pain Loc       Pain Edu?       Excl. in GC?      Physical Exam   Constitutional: He is oriented to person, place, and time. He appears well-developed and well-nourished. No distress.   HENT:   Head: Normocephalic and atraumatic.   Mouth/Throat: Oropharynx is clear and moist.   Eyes: Conjunctivae are normal. Pupils are equal, round, and reactive to light.   Neck: Normal range of motion. Neck supple.   Cardiovascular: Normal rate, regular rhythm and normal heart sounds.    Pulmonary/Chest: Effort normal and breath sounds normal. He exhibits no tenderness.   Abdominal: Soft. There is no tenderness. There is no rebound and no guarding.   Neurological: He is alert and oriented to person, place, and time.   Skin: Skin is warm and dry. Capillary refill takes less than 2 seconds.   Nursing note and vitals reviewed.      MEDICAL DECISION MAKING     DISCUSSION    Patient with pleuritic chest pain.  No PNA or PTX on CXR.  D-dimer negative for PE.  Labs with no anemia, evidence of ACS, or hepatorenal dysfunction.  Improved with toradol, pepcid and GI cocktail.  Normal stress test 6 months ago and history unlikely ACS.  Will treat with pepcid and  ibuprofen.  Follow-up with PCM and return precautions given.      Vital Signs: Reviewed the patient?s vital signs.   Nursing Notes: Reviewed and utilized available nursing notes.  Medical Records Reviewed: Reviewed available past medical records.  Counseling: The emergency provider has spoken with the patient and discussed today?s findings, in addition to providing specific details for the plan of care.  Questions are answered and there is agreement with the plan.          CARDIAC STUDIES    The following cardiac studies were independently interpreted by the Emergency Medicine Physician.  For full cardiac study results please see chart.    Monitor Strip  Interpreted by ED Physician  Rate: 89  Rhythm: NSR   ST Changes: none    EKG Interpretation:  Signed and interpreted byED Physician   Time Interpreted: 1520  Rate: 83  Rhythm: NSR  Axis: LAD  Intervals: normal  Blocks: none  ST segments: no acute changes  Interpretation: Nonspecific  EKG      RADIOLOGY IMAGING STUDIES      Chest AP Portable   Final Result    No acute cardiopulmonary process.          Mitali  Bapna, MD    02/28/2016 4:09 PM              PULSE OXIMETRY    Oxygen Saturation by Pulse Oximetry: 99%  Interventions: none  Interpretation:  Normal     EMERGENCY DEPT. MEDICATIONS      ED Medication Orders     Start Ordered     Status Ordering Provider    02/28/16 1732 02/28/16 1731    Once     Route: Mouth/Throat  Ordered Dose: 10 mL     Discontinued Azalia Bilis [facility]    02/28/16 1732 02/28/16  1731    Once     Route: Oral  Ordered Dose: 30 mL     Discontinued Azalia Bilis [facility]    02/28/16 1609 02/28/16 1608  ketorolac (TORADOL) injection 30 mg  Once     Route: Intravenous  Ordered Dose: 30 mg     Last MAR action:  Given Larina Bras    02/28/16 1609 02/28/16 1608  famotidine (PEPCID) injection 20 mg  Once     Route: Intravenous  Ordered Dose: 20 mg     Last MAR action:  Given Zyler Hyson KAEHLER          LABORATORY RESULTS     Ordered and independently interpreted AVAILABLE laboratory tests. Please see results section in chart for full details.  Results for orders placed or performed during the hospital encounter of 02/28/16   Basic Metabolic Panel   Result Value Ref Range    Glucose 108 (H) 70 - 100 mg/dL    BUN 9 9 - 28 mg/dL    Creatinine 0.8 0.7 - 1.3 mg/dL    Calcium 9.4 8.5 - 82.9 mg/dL    Sodium 562 130 - 865 mEq/L    Potassium 4.3 3.5 - 5.1 mEq/L    Chloride 98 (L) 100 - 111 mEq/L    CO2 30 (H) 22 - 29 mEq/L    Anion Gap 8.0 5.0 - 15.0   Troponin I   Result Value Ref Range    Troponin I <0.01 0.00 - 0.09 ng/mL   CBC with differential   Result Value Ref Range    WBC 7.42 3.50 - 10.80 x10 3/uL    Hgb 12.6 (L) 13.0 - 17.0 g/dL    Hematocrit 78.4 (L) 42.0 - 52.0 %    Platelets 230 140 - 400 x10 3/uL    RBC 4.03 (L) 4.70 - 6.00 x10 6/uL    MCV 92.6 80.0 - 100.0 fL    MCH 31.3 28.0 - 32.0 pg    MCHC 33.8 32.0 - 36.0 g/dL    RDW 12 12 - 15 %    MPV 9.6 9.4 - 12.3 fL    Neutrophils 40.8 None %    Lymphocytes Automated 42.2 None %    Monocytes 12.0 None %    Eosinophils Automated 4.2 None %    Basophils Automated 0.7 None %    Immature Granulocyte 0.1 None %    Nucleated RBC 0.0 0.0 - 1.0 /100 WBC    Neutrophils Absolute 3.03 1.80 - 8.10 x10 3/uL    Abs Lymph Automated 3.13 0.50 - 4.40 x10 3/uL    Abs Mono Automated 0.89 0.00 - 1.20 x10 3/uL    Abs Eos Automated 0.31 0.00 - 0.70 x10 3/uL    Absolute Baso Automated 0.05 0.00 - 0.20 x10 3/uL    Absolute Immature Granulocyte 0.01 0 x10 3/uL    Absolute NRBC 0.00 0 x10 3/uL   GFR   Result Value Ref Range    EGFR >60.0    B-type Natriuretic Peptide   Result Value Ref Range    B-Natriuretic Peptide <10 0 - 100 pg/mL   Hepatic function panel (LFT)   Result Value Ref Range    Bilirubin, Total 0.4 0.2 - 1.2 mg/dL    Bilirubin, Direct 0.1 0.0 - 0.5 mg/dL    Bilirubin, Indirect 0.3 0.0 - 1.1 mg/dL    AST (SGOT) 23 5 - 34 U/L    ALT 14 0 - 55 U/L  Alkaline Phosphatase 73 38 - 106 U/L    Protein,  Total 6.8 6.0 - 8.3 g/dL    Albumin 3.3 (L) 3.5 - 5.0 g/dL    Globulin 3.5 2.0 - 3.6 g/dL    Albumin/Globulin Ratio 0.9 0.9 - 2.2   D-Dimer   Result Value Ref Range    D-Dimer 0.35 0.00 - 0.51 ug/mL FEU   PT/APTT   Result Value Ref Range    PT 13.1 12.6 - 15.0 sec    PT INR 1.0 0.9 - 1.1    PT Anticoag. Given Within 48 hrs. None     PTT 36 23 - 37 sec   ECG 12 Lead   Result Value Ref Range    Ventricular Rate 83 BPM    Atrial Rate 83 BPM    P-R Interval 154 ms    QRS Duration 92 ms    Q-T Interval 372 ms    QTC Calculation (Bezet) 437 ms    P Axis 67 degrees    R Axis -33 degrees    T Axis 10 degrees       CRITICAL CARE/PROCEDURES    Procedures    DIAGNOSIS      Diagnosis:  Final diagnoses:   Chest pain in adult   Substance abuse       Disposition:  ED Disposition     ED Disposition Condition Date/Time Comment    Discharge  Tue Feb 28, 2016  6:29 PM Sheran Fava Bauer discharge to home/self care.    Condition at disposition: Stable          Prescriptions:  Discharge Medication List as of 02/28/2016  6:29 PM      START taking these medications    Details   famotidine (PEPCID) 20 MG tablet Take 1 tablet (20 mg total) by mouth 2 (two) times daily.for 5 days, Starting Tue 02/28/2016, Until Sun 03/04/2016, Print      ibuprofen (ADVIL,MOTRIN) 600 MG tablet Take 1 tablet (600 mg total) by mouth every 6 (six) hours as needed for Pain.for up to 30 doses, Starting Tue 02/28/2016, Print                  Larina Bras, MD  02/28/16 2207

## 2016-02-29 LAB — ECG 12-LEAD
Atrial Rate: 84 {beats}/min
Atrial Rate: 83 {beats}/min
P Axis: 61 degrees
P Axis: 67 degrees
P-R Interval: 148 ms
P-R Interval: 154 ms
Q-T Interval: 362 ms
Q-T Interval: 372 ms
QRS Duration: 90 ms
QRS Duration: 92 ms
QTC Calculation (Bezet): 427 ms
QTC Calculation (Bezet): 437 ms
R Axis: -45 degrees
R Axis: -33 degrees
T Axis: 17 degrees
T Axis: 10 degrees
Ventricular Rate: 84 {beats}/min
Ventricular Rate: 83 {beats}/min

## 2016-04-06 ENCOUNTER — Emergency Department
Admission: EM | Admit: 2016-04-06 | Discharge: 2016-04-06 | Disposition: A | Discharge status: Home / Self Care | Payer: Charity | Attending: Emergency Medicine | Admitting: Emergency Medicine

## 2016-04-06 ENCOUNTER — Emergency Department: Payer: Self-pay

## 2016-04-06 DIAGNOSIS — R0789 Other chest pain: Secondary | ICD-10-CM | POA: Insufficient documentation

## 2016-04-06 DIAGNOSIS — R609 Edema, unspecified: Secondary | ICD-10-CM

## 2016-04-06 DIAGNOSIS — F1910 Other psychoactive substance abuse, uncomplicated: Secondary | ICD-10-CM | POA: Insufficient documentation

## 2016-04-06 DIAGNOSIS — F17210 Nicotine dependence, cigarettes, uncomplicated: Secondary | ICD-10-CM | POA: Insufficient documentation

## 2016-04-06 DIAGNOSIS — R600 Localized edema: Secondary | ICD-10-CM | POA: Insufficient documentation

## 2016-04-06 LAB — CBC AND DIFFERENTIAL
Absolute NRBC: 0 10*3/uL
Basophils Absolute Automated: 0.05 10*3/uL (ref 0.00–0.20)
Basophils Automated: 0.6 %
Eosinophils Absolute Automated: 0.23 10*3/uL (ref 0.00–0.70)
Eosinophils Automated: 2.8 %
Hematocrit: 33.9 % — ABNORMAL LOW (ref 42.0–52.0)
Hgb: 11.6 g/dL — ABNORMAL LOW (ref 13.0–17.0)
Immature Granulocytes Absolute: 0.01 10*3/uL
Immature Granulocytes: 0.1 %
Lymphocytes Absolute Automated: 2.5 10*3/uL (ref 0.50–4.40)
Lymphocytes Automated: 30.2 %
MCH: 31.6 pg (ref 28.0–32.0)
MCHC: 34.2 g/dL (ref 32.0–36.0)
MCV: 92.4 fL (ref 80.0–100.0)
MPV: 10 fL (ref 9.4–12.3)
Monocytes Absolute Automated: 0.8 10*3/uL (ref 0.00–1.20)
Monocytes: 9.7 %
Neutrophils Absolute: 4.7 10*3/uL (ref 1.80–8.10)
Neutrophils: 56.6 %
Nucleated RBC: 0 /100 WBC (ref 0.0–1.0)
Platelets: 183 10*3/uL (ref 140–400)
RBC: 3.67 10*6/uL — ABNORMAL LOW (ref 4.70–6.00)
RDW: 13 % (ref 12–15)
WBC: 8.29 10*3/uL (ref 3.50–10.80)

## 2016-04-06 LAB — BASIC METABOLIC PANEL
Anion Gap: 6 (ref 5.0–15.0)
BUN: 11 mg/dL (ref 9.0–28.0)
CO2: 29 mEq/L (ref 22–29)
Calcium: 9.2 mg/dL (ref 8.5–10.5)
Chloride: 104 mEq/L (ref 100–111)
Creatinine: 0.8 mg/dL (ref 0.7–1.3)
Glucose: 125 mg/dL — ABNORMAL HIGH (ref 70–100)
Potassium: 3.8 mEq/L (ref 3.5–5.1)
Sodium: 139 mEq/L (ref 136–145)

## 2016-04-06 LAB — TROPONIN I: Troponin I: 0.02 ng/mL (ref 0.00–0.09)

## 2016-04-06 LAB — GFR: EGFR: >60

## 2016-04-06 LAB — IHS D-DIMER: D-Dimer: 0.35 ug/mL FEU (ref 0.00–0.70)

## 2016-04-06 LAB — RAPID DRUG SCREEN, URINE
Barbiturate Screen, UR: NEGATIVE
Benzodiazepine Screen, UR: NEGATIVE
Cannabinoid Screen, UR: NEGATIVE
Cocaine, UR: POSITIVE — AB
Opiate Screen, UR: POSITIVE — AB
PCP Screen, UR: NEGATIVE
Urine Amphetamine Screen: NEGATIVE

## 2016-04-06 LAB — HEMOLYSIS INDEX: Hemolysis Index: 8 (ref 0–18)

## 2016-04-06 MED ORDER — HYDROCHLOROTHIAZIDE 12.5 MG PO TABS
12.5000 mg | ORAL_TABLET | Freq: Every day | ORAL | Status: DC
Start: 2016-04-06 — End: 2016-04-06

## 2016-04-06 MED ORDER — HYDROCHLOROTHIAZIDE 12.5 MG PO TABS
ORAL_TABLET | ORAL | Status: AC
Start: 2016-04-06 — End: 2016-04-06
  Administered 2016-04-06: 12.5 mg via ORAL
  Filled 2016-04-06: qty 1

## 2016-04-06 MED ORDER — HYDROCHLOROTHIAZIDE 12.5 MG PO TABS
12.5000 mg | ORAL_TABLET | Freq: Every day | ORAL | 0 refills | Status: AC
Start: 2016-04-06 — End: 2016-04-20

## 2016-04-06 NOTE — Discharge Instructions (Signed)
Chest Pain of Unclear Etiology    You have been seen for chest pain. The cause of your pain is not yet known.    Your doctor has learned about your medical history, examined you, and checked any tests that were done. Still, it is unclear why you are having pain. The doctor thinks there is only a very small chance that your pain is caused by a life-threatening condition. Later, your primary care doctor might do more tests or check you again.    Sometimes chest pain is caused by a dangerous condition, like a heart attack, aorta injury, blood clot in the lung, or collapsed lung. It is unlikely that your pain is caused by a life-threatening condition if: Your chest pain lasts only a few seconds at a time; you are not short of breath, nauseated (sick to your stomach), sweaty, or lightheaded; your pain gets worse when you twist or bend; your pain improves with exercise or hard work.    Chest pain is serious. It is VERY IMPORTANT that you follow up with your regular doctor and seek medical attention immediately here or at the nearest Emergency Department if your symptoms become worse or they change.    YOU SHOULD SEEK MEDICAL ATTENTION IMMEDIATELY, EITHER HERE OR AT THE NEAREST EMERGENCY DEPARTMENT, IF ANY OF THE FOLLOWING OCCURS:   Your pain gets worse.   Your pain makes you short of breath, nauseated, or sweaty.   Your pain gets worse when you walk, go up stairs, or exert yourself.   You feel weak, lightheaded, or faint.   It hurts to breathe.   Your leg swells.   Your symptoms get worse or you have new symptoms or concerns.           LE Edema Etiology Unknown    You have been seen today for swelling of your leg(s).    Despite the doctor's evaluation, the cause of the swelling is unknown.    There are many possible causes for leg swelling. Below is a description of some of these causes:   Deep Venous Thrombus (DVT): This is a blood clot in one of the deep veins of your leg. Symptoms can include leg pain  and swelling. The diagnosis is often made with a leg ultrasound which can detect a blockage in the veins.   Congestive heart failure is a condition where some fluid builds up in the lungs because of a heart problem. The main symptom is shortness of breath which worsens when you lie down. You may also notice leg swelling and weight gain.   Cellulitis: This is a bacterial infection of the skin. Symptoms are usually redness, swelling, and warmth in the affected area. Some people get a fever (temperature higher than 100.5F / 38C) with this infection.   Venous Stasis: This happens when blood pools in the legs for some time. The legs get swollen and sometimes get red. The swelling gets better at night because lying flat makes it easier for blood to return to the heart. During the day as a person stands or sits with their legs dangling down, the blood has trouble getting out of the legs again and swelling gets worse.   Low amounts of Albumin: Albumin is a type of protein that is carried in the blood stream. One thing it does is keep the fluid part of the blood from leaking out of the blood vessels and into the surrounding tissues. Low albumin can be from poor nutrition, alcoholism, liver  disease or other chronic (ongoing) illnesses.    The exact cause of the swelling is not known even though tests may have been done here today. Even though the cause is not known, the doctor feels that it is OK for you to go home. You need to see your doctor or referral doctor for more tests.    YOU SHOULD SEEK MEDICAL ATTENTION IMMEDIATELY, EITHER HERE OR AT THE NEAREST EMERGENCY DEPARTMENT, IF ANY OF THE FOLLOWING OCCURS:   Your leg swelling gets worse.   Your legs get red and you have pain / fever (temperature higher than 100.41F / 38C).   You develop any shortness of breath, chest pain or pressure over your heart.   You develop any shortness of breath, especially if there is pain in your chest when you breathe in.                  Substance / Drug Abuse    After evaluating you today, your doctor believes that you have a problem with substance abuse.    Substance abuse is a physical addiction to a drug. It is a serious problem that can be life-threatening. It can ruin your life as well as the lives of those who care about you.    It is important to have a counselor and a family doctor who see you on a regular basis. A counselor can help you with your problem, keep a close eye on you and follow your progress.    Stay with someone who can watch you tonight and who can help you avoid situations where you are likely to abuse again.    DO NOT DRIVE A VEHICLE UNDER THE INFLUENCE OF ALCOHOL OR OTHER ILLEGAL SUBSTANCES! YOU MAY INJURE OR KILL YOURSELF OR SOMEONE ELSE IF YOU DRINK OR USE AND DRIVE.    YOU SHOULD SEEK MEDICAL ATTENTION IMMEDIATELY, EITHER HERE OR AT THE NEAREST EMERGENCY DEPARTMENT, IF ANY OF THE FOLLOWING OCCURS:   You think of harming yourself or committing suicide.    You feel unsafe in your home environment.   You become worse or feel that you cannot wait until your follow-up appointment for treatment.

## 2016-04-06 NOTE — ED Triage Notes (Signed)
[name] [name] is a 51 y.o. male  Has a c/o CP and hurts when the pt breathes. He is concerned that he may have a PE or  DVT. The pt lives in a shelter and sleeps while sitting up.   BP 129/83   Pulse 75   Temp 98.1 F (36.7 C)   Resp 18   Ht 5\' 8"  (1.727 m)   Wt 81.6 kg   SpO2 98%   BMI 27.37 kg/m

## 2016-04-06 NOTE — ED Provider Notes (Signed)
EMERGENCY DEPARTMENT HISTORY AND PHYSICAL EXAM     Physician/Midlevel provider first contact with patient: 04/06/16 0039         Date: 04/06/2016  Patient Name: [name] [name]  Attending Physician:  Ames Dura, DO, FACOEP      History of Presenting Illness     Chief Complaint   Patient presents with   . Chest Pain       History Provided By: Pt  Chief Complaint: CP  Onset: Today  Timing: Constant  Location: Diffuse  Severity: Moderate  Modifying Factors: None  Associated sxs: Leg swelling    Additional History: [name] [name] is a 51 y.o. male c/o CP since today and BLE swelling for "weeks." He states he has varicose veins and is "worried that might shoot up into a blood clot in my lungs." He also states he had been "sitting up and sleeping recently because I don't have a place to sleep." Also states he had had nasal congestion recently. He did not get a flu shot this year. 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    PCP: Pcp, Noneorunknown, MD      Current Facility-Administered Medications   Medication Dose Route Frequency Provider Last Rate Last Dose   . hydroCHLOROthiazide (HYDRODIURIL) 12.5 MG tablet            . hydroCHLOROthiazide (HYDRODIURIL) tablet 12.5 mg  12.5 mg Oral Daily Ames Dura, DO         Current Outpatient Prescriptions   Medication Sig Dispense Refill   . hydroCHLOROthiazide (HYDRODIURIL) 12.5 MG tablet Take 1 tablet (12.5 mg total) by mouth daily.for 14 days 14 tablet 0   . ibuprofen (ADVIL,MOTRIN) 600 MG tablet Take 1 tablet (600 mg total) by mouth every 6 (six) hours as needed for Pain.for up to 30 doses 30 tablet 0       Past Medical History   Past Medical History:  Past Medical History:   Diagnosis Date   . Depression    . Gallstones    . Gastritis    . Heart murmur    . Renal calculus        Past Surgical History:  History reviewed. No pertinent surgical history.    Family History:  Family History   Problem Relation Age of Onset   . Diabetes  Mother    . Hypertension Mother        Social History:  Social History   Substance Use Topics   . Smoking status: Current Every Day Smoker     Packs/day: 1.00     Years: 0.50     Types: Cigarettes   . Smokeless tobacco: Never Used   . Alcohol use Yes      Comment: occasionally       Allergies:  Allergies   Allergen Reactions   . Gluten      Pt states "white bread"   . Percocet [Oxycodone-Acetaminophen]    . Tylenol [Acetaminophen] Hives     Tyelnol#3, hives and throw up       Review of Systems     Review of Systems   HENT: Positive for congestion.    Cardiovascular: Positive for chest pain and leg swelling.   All other systems reviewed and are negative.       Physical Exam     BP 129/83   Pulse 75   Temp 98.1 F (36.7 C)   Resp 18   Ht 5'  8" (1.727 m)   Wt 81.6 kg   SpO2 98%   BMI 27.37 kg/m   Pulse Oximetry Analysis - Normal 98% on RA    Physical Exam   Constitutional: He appears well-developed and well-nourished. No distress.   HENT:   Head: Normocephalic and atraumatic.   Eyes: Pupils are equal, round, and reactive to light. Right eye exhibits no discharge. Left eye exhibits no discharge.   Neck: Normal range of motion. Neck supple. No JVD present.   Cardiovascular: Normal rate and regular rhythm.    Pulmonary/Chest: Effort normal. No stridor. No respiratory distress. He has no wheezes.   Abdominal: Soft. There is no tenderness.   Musculoskeletal: Normal range of motion. He exhibits edema (2+ b/l LE pitting edema).   Neurological: He is alert. He exhibits normal muscle tone. Coordination normal.   Skin: Skin is warm and dry. He is not diaphoretic.   Psychiatric: He has a normal mood and affect. His behavior is normal.   Vitals reviewed.        Diagnostic Study Results     Labs -     Results     Procedure Component Value Units Date/Time    Urine Tox Screen (Rapid Drug Screen) [[id number]]  (Abnormal) Collected:  04/06/16 0131    Specimen:  Urine Updated:  04/06/16 0146     Amphetamine Screen, UR Negative      Barbiturate Screen, UR Negative     Benzodiazepine Screen, UR Negative     Cannabinoid Screen, UR Negative     Cocaine, UR Positive (A)     Opiate Screen, UR Positive (A)     PCP Screen, UR Negative    D-Dimer [[id number]] Collected:  04/06/16 0051     Updated:  04/06/16 0144     D-Dimer 0.35 ug/mL FEU     Troponin I [[id number]] Collected:  04/06/16 0051    Specimen:  Blood Updated:  04/06/16 0120     Troponin I 0.02 ng/mL     Basic Metabolic Panel [[id number]]  (Abnormal) Collected:  04/06/16 0051    Specimen:  Blood Updated:  04/06/16 0112     Glucose 125 (H) mg/dL      BUN 62.9 mg/dL      Creatinine 0.8 mg/dL      Calcium 9.2 mg/dL      Sodium 528 mEq/L      Potassium 3.8 mEq/L      Chloride 104 mEq/L      CO2 29 mEq/L      Anion Gap 6.0    Hemolysis index [[id number]] Collected:  04/06/16 0051     Updated:  04/06/16 0112     Hemolysis Index 8    GFR [[id number]] Collected:  04/06/16 0051     Updated:  04/06/16 0112     EGFR >60.0    CBC with differential [[id number]]  (Abnormal) Collected:  04/06/16 0051    Specimen:  Blood from Blood Updated:  04/06/16 0059     WBC 8.29 x10 3/uL      Hgb 11.6 (L) g/dL      Hematocrit 63.8 (L) %      Platelets 183 x10 3/uL      RBC 3.67 (L) x10 6/uL      MCV 92.4 fL      MCH 31.6 pg      MCHC 34.2 g/dL      RDW 13 %      MPV 10.0 fL  Neutrophils 56.6 %      Lymphocytes Automated 30.2 %      Monocytes 9.7 %      Eosinophils Automated 2.8 %      Basophils Automated 0.6 %      Immature Granulocyte 0.1 %      Nucleated RBC 0.0 /100 WBC      Neutrophils Absolute 4.70 x10 3/uL      Abs Lymph Automated 2.50 x10 3/uL      Abs Mono Automated 0.80 x10 3/uL      Abs Eos Automated 0.23 x10 3/uL      Absolute Baso Automated 0.05 x10 3/uL      Absolute Immature Granulocyte 0.01 x10 3/uL      Absolute NRBC 0.00 x10 3/uL           Radiologic Studies -   Radiology Results (24 Hour)     ** No results found for the last 24 hours. **      .    Medical Decision Making   I am the first provider  for this patient.    I reviewed the vital signs, available nursing notes, past medical history, past surgical history, family history and social history.    Vital Signs-Reviewed the patient's vital signs.     Patient Vitals for the past 12 hrs:   BP Temp Pulse Resp   04/06/16 0023 129/83 98.1 F (36.7 C) 75 18       Cardiac Monitor:  Rate: 77  Rhythm:  Normal Sinus Rhythm     EKG:  Interpreted by the Emergency Physician.   Time Interpreted: 0019   Rate: 75  Rhythm:  Normal Sinus  Ectopy:  None  Rate:  Normal  Conduction:  No blocks  ST Segments:  No acute ST segment changes  T Waves:  No acute T Wave changes  Axis:  Left  Clinical Impression:  Non-specific EKG    Old Medical Records: Old medical records.     Doctor's Notes     ED Course:   1:17 AM - Pt agreeable with plan for checking labs including d-dimer and CXR.  3:15 AM - Pt resting comfortably, sleeping upon re eval, feels better and would like to go home. Pt was made aware of all results and counseled on f/u plans with [facility]. Discussed use of HCTZ at home. Discussed all return precautions at length. All questions were answered and all concerns were addressed. Pt is stable, agrees with plan, and is amenable to discharge.    Medical Decision Making:   No acute illness. ED w/u unremarkable and VSS during ED observation period. Will give low-dose HCTZ for leg edema. Pt advised to stop using illicit drugs.    Diagnosis and Treatment Plan       Clinical Impression:   1. Non-cardiac chest pain    2. Peripheral edema    3. Substance abuse        Treatment Plan:   ED Disposition     ED Disposition Condition Date/Time Comment    Discharge  Fri Apr 06, 2016  3:08 AM [name] [name] discharge to home/self care.    Condition at disposition: Stable            _______________________________    Attestations:  This note is prepared by Latrelle Dodrill, acting as scribe for Ames Dura, DO, [place]. The scribe's documentation has been prepared under my direction and personally  reviewed by me in its entirety.  I confirm  that the note above accurately reflects all work, treatment, procedures, and medical decision making performed by me.     I am the first provider for this patient.      Ames Dura, DO, FACOEP is the primary emergency doctor of record.    _______________________________           Ames Dura, DO  04/06/16 0981

## 2016-04-07 LAB — ECG 12-LEAD
Atrial Rate: 75 {beats}/min
P Axis: 60 degrees
P-R Interval: 144 ms
Q-T Interval: 368 ms
QRS Duration: 78 ms
QTC Calculation (Bezet): 410 ms
R Axis: -37 degrees
T Axis: 13 degrees
Ventricular Rate: 75 {beats}/min

## 2016-04-17 ENCOUNTER — Emergency Department
Admission: EM | Admit: 2016-04-17 | Discharge: 2016-04-17 | Disposition: A | Discharge status: Home / Self Care | Payer: Charity | Attending: Emergency Medicine | Admitting: Emergency Medicine

## 2016-04-17 ENCOUNTER — Emergency Department: Payer: Self-pay

## 2016-04-17 ENCOUNTER — Emergency Department: Payer: Charity

## 2016-04-17 DIAGNOSIS — F17210 Nicotine dependence, cigarettes, uncomplicated: Secondary | ICD-10-CM | POA: Insufficient documentation

## 2016-04-17 DIAGNOSIS — R0602 Shortness of breath: Secondary | ICD-10-CM | POA: Insufficient documentation

## 2016-04-17 DIAGNOSIS — R079 Chest pain, unspecified: Secondary | ICD-10-CM | POA: Insufficient documentation

## 2016-04-17 LAB — CBC AND DIFFERENTIAL
Absolute NRBC: 0 10*3/uL
Basophils Absolute Automated: 0.05 10*3/uL (ref 0.00–0.20)
Basophils Automated: 0.7 %
Eosinophils Absolute Automated: 0.33 10*3/uL (ref 0.00–0.70)
Eosinophils Automated: 4.7 %
Hematocrit: 34.1 % — ABNORMAL LOW (ref 42.0–52.0)
Hgb: 11.8 g/dL — ABNORMAL LOW (ref 13.0–17.0)
Immature Granulocytes Absolute: 0.01 10*3/uL
Immature Granulocytes: 0.1 %
Lymphocytes Absolute Automated: 2.62 10*3/uL (ref 0.50–4.40)
Lymphocytes Automated: 37.7 %
MCH: 31.5 pg (ref 28.0–32.0)
MCHC: 34.6 g/dL (ref 32.0–36.0)
MCV: 90.9 fL (ref 80.0–100.0)
MPV: 10 fL (ref 9.4–12.3)
Monocytes Absolute Automated: 0.79 10*3/uL (ref 0.00–1.20)
Monocytes: 11.4 %
Neutrophils Absolute: 3.15 10*3/uL (ref 1.80–8.10)
Neutrophils: 45.4 %
Nucleated RBC: 0 /100 WBC (ref 0.0–1.0)
Platelets: 190 10*3/uL (ref 140–400)
RBC: 3.75 10*6/uL — ABNORMAL LOW (ref 4.70–6.00)
RDW: 13 % (ref 12–15)
WBC: 6.95 10*3/uL (ref 3.50–10.80)

## 2016-04-17 LAB — COMPREHENSIVE METABOLIC PANEL
ALT: 15 U/L (ref 0–55)
AST (SGOT): 19 U/L (ref 5–34)
Albumin/Globulin Ratio: 1.2 (ref 0.9–2.2)
Albumin: 3.8 g/dL (ref 3.5–5.0)
Alkaline Phosphatase: 77 U/L (ref 38–106)
Anion Gap: 10 (ref 5.0–15.0)
BUN: 12 mg/dL (ref 9–28)
Bilirubin, Total: 0.5 mg/dL (ref 0.2–1.2)
CO2: 26 mEq/L (ref 22–29)
Calcium: 9.2 mg/dL (ref 8.5–10.5)
Chloride: 101 mEq/L (ref 100–111)
Creatinine: 0.9 mg/dL (ref 0.7–1.3)
Globulin: 3.1 g/dL (ref 2.0–3.6)
Glucose: 129 mg/dL — ABNORMAL HIGH (ref 70–100)
Potassium: 4.1 mEq/L (ref 3.5–5.1)
Protein, Total: 6.9 g/dL (ref 6.0–8.3)
Sodium: 137 mEq/L (ref 136–145)

## 2016-04-17 LAB — GFR: EGFR: >60

## 2016-04-17 LAB — TROPONIN I: Troponin I: <0.01 ng/mL (ref 0.00–0.09)

## 2016-04-17 MED ORDER — IBUPROFEN 600 MG PO TABS
600.0000 mg | ORAL_TABLET | Freq: Four times a day (QID) | ORAL | 0 refills | Status: DC | PRN
Start: 2016-04-17 — End: 2019-02-11

## 2016-04-17 MED ORDER — LIDOCAINE VISCOUS 2 % MT SOLN
10.0000 mL | Freq: Once | OROMUCOSAL | Status: AC
Start: 2016-04-17 — End: 2016-04-17
  Administered 2016-04-17: 22:00:00 10 mL via OROMUCOSAL
  Filled 2016-04-17: qty 15

## 2016-04-17 MED ORDER — KETOROLAC TROMETHAMINE 30 MG/ML IJ SOLN
15.0000 mg | Freq: Once | INTRAMUSCULAR | Status: AC
Start: 2016-04-17 — End: 2016-04-17
  Administered 2016-04-17: 22:00:00 15 mg via INTRAVENOUS
  Filled 2016-04-17: qty 1

## 2016-04-17 MED ORDER — ALUM & MAG HYDROXIDE-SIMETH 200-200-20 MG/5ML PO SUSP
30.0000 mL | Freq: Once | ORAL | Status: AC
Start: 2016-04-17 — End: 2016-04-17
  Administered 2016-04-17: 22:00:00 30 mL via ORAL
  Filled 2016-04-17: qty 30

## 2016-04-17 MED ORDER — FAMOTIDINE 10 MG/ML IV SOLN (WRAP)
20.0000 mg | Freq: Once | INTRAVENOUS | Status: AC
Start: 2016-04-17 — End: 2016-04-17
  Administered 2016-04-17: 22:00:00 20 mg via INTRAVENOUS
  Filled 2016-04-17: qty 2

## 2016-04-17 NOTE — ED Provider Notes (Signed)
EMERGENCY DEPARTMENT NOTE    Physician/Midlevel provider first contact with patient: 04/17/16 2142         HISTORY OF PRESENT ILLNESS   Historian:Patient  Translator Used: No    Chief Complaint: Shortness of Breath       51 y.o. male hx of gastritis, depression, recurrent noncardiac chest pain who presents with shortness of breath. Pt reports symptom onset was earlier today. Report sudden shortness of breath. Pt also noted chest discomfort with inspiration. No fevers or chills. Denies cough. No productive sputum.  Pt also reports hx of varicose veins. States he has continued leg discomfort overlying the left anterior knee     1. Location of symptoms: pulm  2. Onset of symptoms: earlier today   3. What was patient doing when symptoms started (Context): see above  4. Severity: moderate  5. Timing: intermittent   6. Activities that worsen symptoms: n/a   7. Activities that improve symptoms: n/a  8. Quality: tightness   9. Radiation of symptoms: no  10. Associated signs and Symptoms: see above  11. Are symptoms worsening? yes  MEDICAL HISTORY     Past Medical History:  Past Medical History:   Diagnosis Date   . Depression    . Gallstones    . Gastritis    . Heart murmur    . Renal calculus        Past Surgical History:  History reviewed. No pertinent surgical history.    Social History:  Social History     Social History   . Marital status: Single     Spouse name: N/A   . Number of children: N/A   . Years of education: N/A     Occupational History   . Not on file.     Social History Main Topics   . Smoking status: Current Every Day Smoker     Packs/day: 1.00     Years: 0.50     Types: Cigarettes   . Smokeless tobacco: Never Used   . Alcohol use Yes      Comment: occasionally   . Drug use: Yes     Types: Marijuana, Cocaine   . Sexual activity: Not on file     Other Topics Concern   . Not on file     Social History Narrative   . No narrative on file       Family History:  Family History   Problem Relation Age of Onset   .  Diabetes Mother    . Hypertension Mother        Outpatient Medication:  Previous Medications    HYDROCHLOROTHIAZIDE (HYDRODIURIL) 12.5 MG TABLET    Take 1 tablet (12.5 mg total) by mouth daily.for 14 days         REVIEW OF SYSTEMS   Review of Systems  Constitutional: Negative for fever and chills.   HENT: Negative for congestion and sore throat.  Eyes: Negative for eye discharge and eye redness.   Cardiovascular: Negative for chest pain and chest pressure   Respiratory: Positive for shortness of breath   Gastrointestinal: Negative for nausea, vomiting, abdominal pain and diarrhea.   Genitourinary: Negative for dysuria, urgency and frequency.   Neurological: Negative for dizziness, focal weakness, numbness.   All other systems negative.  PHYSICAL EXAM     ED Triage Vitals [04/17/16 2135]   Enc Vitals Group      BP 132/62      Heart Rate 75  Resp Rate 18      Temp 98.8 F (37.1 C)      Temp Source Oral      SpO2 99 %      Weight 81.2 kg      Height 1.727 m      Head Circumference       Peak Flow       Pain Score       Pain Loc       Pain Edu?       Excl. in GC?        Constitutional: Vital signs reviewed. Well appearing, well hydrated, well perfused, non-toxic appearing, no apparent distress  Head:  Normocephalic, atraumatic  Eyes: PERRL, normal conjunctiva bilaterally, EOMI  ENT: Mucous membranes moist.  .  Neck: Normal range of motion. Non-tender.   Respiratory/Chest: clear to auscultation. No work of breathing. No tachypnea..  Cardiovascular: Regular rate and rhythm. No murmur.   Abdomen: Soft and nontender in all quadrants. No guarding or rebound. No masses or hepatosplenomegaly.Marland Kitchen  UpperExtremity: No edema or cyanosis.  LowerExtremity: Noted bilateral lower extremity edema   Neurological: Awake and alert. No focal motor deficits by observation.  Skin: Warm and dry. No rash.  Lymphatic: No cervical lymphadenopathy.    MEDICAL DECISION MAKING     51 y.o. male hx of gastritis, depression, recurrent noncardiac  chest pain who presents with shortness of breath    Troponin, CXR within normal limits   Likely pleurisy vs MSK etiology in nature     Pt with prior hx of BLE pain. Recent negative dimer on 2/9. Symptoms likely due to continue varicose veins   Outpatient follow-up with primary physician given       DISCUSSION        Vital Signs: Reviewed the patient?s vital signs.   Nursing Notes: Reviewed and utilized available nursing notes.  Medical Records Reviewed: Reviewed available past medical records.  Counseling: The emergency provider has spoken with the patient and discussed today?s findings, in addition to providing specific details for the plan of care.  Questions are answered and there is agreement with the plan.      CARDIAC STUDIES    The following cardiac studies were independently interpreted by the Emergency Medicine Physician.  For full cardiac study results please see chart.    Monitor Strip  Interpreted by ED Physician  Rate: 75  Rhythm: NSR   ST Changes: none    EKG Interpretation:  Signed and interpreted by ED Physician   Time Interpreted: 21:37  Comparison: 04/06/16  Rate: 83  Rhythm: NSR  Axis: LAD  Intervals: normal  Blocks: none   ST segments: nonspecific ST changes   Interpretation: nonspecific EKG     IMAGING STUDIES    The following imaging studies were independently interpreted by the Emergency Medicine Physician.  For full imaging study results please see chart.      PULSE OXIMETRY    Oxygen Saturation by Pulse Oximetry: 99%  Interventions: none  Interpretation:  Normal     EMERGENCY DEPT. MEDICATIONS      ED Medication Orders     Start Ordered     Status Ordering Provider    04/17/16 2201 04/17/16 2200  lidocaine viscous (XYLOCAINE) 2 % mouth solution 10 mL  Once     Route: Mouth/Throat  Ordered Dose: 10 mL     Last MAR action:  Given ADJEI-TWUM, Thyra Yinger    04/17/16 2201 04/17/16 2200  alum & mag hydroxide-simethicone (  MAALOX PLUS) 200-200-20 mg/5 mL suspension 30 mL  Once     Route: Oral  Ordered  Dose: 30 mL     Last MAR action:  Given ADJEI-TWUM, Nicholaus Steinke    04/17/16 2201 04/17/16 2200  ketorolac (TORADOL) injection 15 mg  Once     Route: Intravenous  Ordered Dose: 15 mg     Last MAR action:  Given ADJEI-TWUM, Jailen Coward    04/17/16 2201 04/17/16 2200  famotidine (PEPCID) injection 20 mg  Once     Route: Intravenous  Ordered Dose: 20 mg     Last MAR action:  Given ADJEI-TWUM, Ivey Nembhard          LABORATORY RESULTS    Ordered and independently interpreted AVAILABLE laboratory tests. Please see results section in chart for full details.  Results for orders placed or performed during the hospital encounter of 04/17/16   CBC with differential   Result Value Ref Range    WBC 6.95 3.50 - 10.80 x10 3/uL    Hgb 11.8 (L) 13.0 - 17.0 g/dL    Hematocrit 16.1 (L) 42.0 - 52.0 %    Platelets 190 140 - 400 x10 3/uL    RBC 3.75 (L) 4.70 - 6.00 x10 6/uL    MCV 90.9 80.0 - 100.0 fL    MCH 31.5 28.0 - 32.0 pg    MCHC 34.6 32.0 - 36.0 g/dL    RDW 13 12 - 15 %    MPV 10.0 9.4 - 12.3 fL    Neutrophils 45.4 None %    Lymphocytes Automated 37.7 None %    Monocytes 11.4 None %    Eosinophils Automated 4.7 None %    Basophils Automated 0.7 None %    Immature Granulocyte 0.1 None %    Nucleated RBC 0.0 0.0 - 1.0 /100 WBC    Neutrophils Absolute 3.15 1.80 - 8.10 x10 3/uL    Abs Lymph Automated 2.62 0.50 - 4.40 x10 3/uL    Abs Mono Automated 0.79 0.00 - 1.20 x10 3/uL    Abs Eos Automated 0.33 0.00 - 0.70 x10 3/uL    Absolute Baso Automated 0.05 0.00 - 0.20 x10 3/uL    Absolute Immature Granulocyte 0.01 0 x10 3/uL    Absolute NRBC 0.00 0 x10 3/uL   Comprehensive Metabolic Panel (CMP)   Result Value Ref Range    Glucose 129 (H) 70 - 100 mg/dL    BUN 12 9 - 28 mg/dL    Creatinine 0.9 0.7 - 1.3 mg/dL    Sodium 096 045 - 409 mEq/L    Potassium 4.1 3.5 - 5.1 mEq/L    Chloride 101 100 - 111 mEq/L    CO2 26 22 - 29 mEq/L    Calcium 9.2 8.5 - 10.5 mg/dL    Protein, Total 6.9 6.0 - 8.3 g/dL    Albumin 3.8 3.5 - 5.0 g/dL    AST (SGOT) 19 5 - 34 U/L    ALT  15 0 - 55 U/L    Alkaline Phosphatase 77 38 - 106 U/L    Bilirubin, Total 0.5 0.2 - 1.2 mg/dL    Globulin 3.1 2.0 - 3.6 g/dL    Albumin/Globulin Ratio 1.2 0.9 - 2.2    Anion Gap 10.0 5.0 - 15.0   Troponin I   Result Value Ref Range    Troponin I <0.01 0.00 - 0.09 ng/mL   GFR   Result Value Ref Range    EGFR >60.0  ECG 12 Lead   Result Value Ref Range    Ventricular Rate 83 BPM    Atrial Rate 83 BPM    P-R Interval 148 ms    QRS Duration 84 ms    Q-T Interval 364 ms    QTC Calculation (Bezet) 427 ms    P Axis 63 degrees    R Axis -34 degrees    T Axis 17 degrees       DIAGNOSIS      Diagnosis:  Final diagnoses:   Shortness of breath   Intermittent chest pain       Disposition:  ED Disposition     ED Disposition Condition Date/Time Comment    Discharge  Tue Apr 17, 2016 10:57 PM [name] discharge to home/self care.    Condition at disposition: Stable          Prescriptions:  Patient's Medications   New Prescriptions    No medications on file   Previous Medications    HYDROCHLOROTHIAZIDE (HYDRODIURIL) 12.5 MG TABLET    Take 1 tablet (12.5 mg total) by mouth daily.for 14 days   Modified Medications    Modified Medication Previous Medication    IBUPROFEN (ADVIL,MOTRIN) 600 MG TABLET ibuprofen (ADVIL,MOTRIN) 600 MG tablet       Take 1 tablet (600 mg total) by mouth every 6 (six) hours as needed for Pain.    Take 1 tablet (600 mg total) by mouth every 6 (six) hours as needed for Pain.for up to 30 doses   Discontinued Medications    No medications on file            Rulon Abide, MD  04/17/16 2319

## 2016-04-17 NOTE — Discharge Instructions (Signed)
Shortness of Breath, Unclear Etiology    You have been seen today for shortness of breath, or difficulty breathing; but we don't know the cause.    This means that after talking about your medical problems, doing a physical exam, and looking at the tests that were done, there is still not a clear explanation as to why you are short of breath. You may need to go see your doctor for another exam or more tests to find out why you are short of breath. .    CAUTION: Even after a workup, including EKGs, lab tests, x-rays and even CAT Scans (CT scans), it is still possible to have a serious problem that cannot be detected at first.    The doctor caring for you feels that the cause of your shortness of breath is not from a life-threatening problem.    Some of the more dangerous medical problems such as a heart attack, blood clot in the lung (pulmonary embolism), asthma, collapsed lung, heart failure, etc. have been looked at, but are not felt to be the cause of your shortness of breath.    Shortness of breath is a serious symptom and must be handled carefully. It is VERY IMPORTANT that you see your regular doctor and return for re-evaluation or seek medical attention immediately if your symptoms become worse or change.    YOU SHOULD SEEK MEDICAL ATTENTION IMMEDIATELY, EITHER HERE OR AT THE NEAREST EMERGENCY DEPARTMENT, IF ANY OF THE FOLLOWING OCCURS:   Your shortness of breath returns.   You notice that your shortness of breath happens as you walk, go up stairs, or exert yourself.   If you develop chest pain, sweating, or nausea (feel sick to your stomach).   Rapid heartbeat.   You have any weakness, lightheadedness or you get so short of breath that you pass out.   It hurts to breathe.   Your leg swells.   Any other worsening symptoms or problems.   If you are unable to sleep because of shortness of breath, or you wake in the middle of the night short of breath.

## 2016-04-18 ENCOUNTER — Emergency Department
Admission: EM | Admit: 2016-04-18 | Discharge: 2016-04-19 | Disposition: A | Discharge status: Home / Self Care | Payer: Charity | Attending: Emergency Medicine | Admitting: Emergency Medicine

## 2016-04-18 ENCOUNTER — Emergency Department: Payer: Charity

## 2016-04-18 DIAGNOSIS — R079 Chest pain, unspecified: Secondary | ICD-10-CM | POA: Insufficient documentation

## 2016-04-18 DIAGNOSIS — F17210 Nicotine dependence, cigarettes, uncomplicated: Secondary | ICD-10-CM | POA: Insufficient documentation

## 2016-04-18 NOTE — ED Triage Notes (Signed)
Pt  Presents with SOB and left sided chest/shoulder pain that started 30 mins ago. Pt is also c/o left leg (front quad) pain. When this RN placed EKG leads, pt had stickers for EKG from another facility, but denies being seen anywhere else.

## 2016-04-19 DIAGNOSIS — R079 Chest pain, unspecified: Secondary | ICD-10-CM

## 2016-04-19 LAB — CBC AND DIFFERENTIAL
Absolute NRBC: 0 10*3/uL
Basophils Absolute Automated: 0.03 10*3/uL (ref 0.00–0.20)
Basophils Automated: 0.4 %
Eosinophils Absolute Automated: 0.27 10*3/uL (ref 0.00–0.70)
Eosinophils Automated: 3.4 %
Hematocrit: 32.9 % — ABNORMAL LOW (ref 42.0–52.0)
Hgb: 11.1 g/dL — ABNORMAL LOW (ref 13.0–17.0)
Immature Granulocytes Absolute: 0.03 10*3/uL
Immature Granulocytes: 0.4 %
Lymphocytes Absolute Automated: 2.22 10*3/uL (ref 0.50–4.40)
Lymphocytes Automated: 28 %
MCH: 31.2 pg (ref 28.0–32.0)
MCHC: 33.7 g/dL (ref 32.0–36.0)
MCV: 92.4 fL (ref 80.0–100.0)
MPV: 10 fL (ref 9.4–12.3)
Monocytes Absolute Automated: 0.65 10*3/uL (ref 0.00–1.20)
Monocytes: 8.2 %
Neutrophils Absolute: 4.74 10*3/uL (ref 1.80–8.10)
Neutrophils: 59.6 %
Nucleated RBC: 0 /100 WBC (ref 0.0–1.0)
Platelets: 174 10*3/uL (ref 140–400)
RBC: 3.56 10*6/uL — ABNORMAL LOW (ref 4.70–6.00)
RDW: 13 % (ref 12–15)
WBC: 7.94 10*3/uL (ref 3.50–10.80)

## 2016-04-19 LAB — TROPONIN I: Troponin I: <0.01 ng/mL (ref 0.00–0.09)

## 2016-04-19 LAB — BASIC METABOLIC PANEL
Anion Gap: 6 (ref 5.0–15.0)
BUN: 16 mg/dL (ref 9.0–28.0)
CO2: 28 mEq/L (ref 22–29)
Calcium: 9 mg/dL (ref 8.5–10.5)
Chloride: 104 mEq/L (ref 100–111)
Creatinine: 0.9 mg/dL (ref 0.7–1.3)
Glucose: 146 mg/dL — ABNORMAL HIGH (ref 70–100)
Potassium: 4.2 mEq/L (ref 3.5–5.1)
Sodium: 138 mEq/L (ref 136–145)

## 2016-04-19 LAB — ECG 12-LEAD
Atrial Rate: 83 {beats}/min
P Axis: 63 degrees
P-R Interval: 148 ms
Q-T Interval: 364 ms
QRS Duration: 84 ms
QTC Calculation (Bezet): 427 ms
R Axis: -34 degrees
T Axis: 17 degrees
Ventricular Rate: 83 {beats}/min

## 2016-04-19 LAB — GFR: EGFR: >60

## 2016-04-19 LAB — HEMOLYSIS INDEX: Hemolysis Index: 7 (ref 0–18)

## 2016-04-19 MED ORDER — SODIUM CHLORIDE 0.9 % IV BOLUS
1000.0000 mL | Freq: Once | INTRAVENOUS | Status: AC
Start: 2016-04-19 — End: 2016-04-19
  Administered 2016-04-19: 01:00:00 1000 mL via INTRAVENOUS

## 2016-04-19 NOTE — ED Provider Notes (Signed)
EMERGENCY DEPARTMENT HISTORY AND PHYSICAL EXAM    Date Time: 04/19/16 5:11 AM  Patient Name: [name] [name]  Attending Physician: No att. providers found  Mid-Level: Toney Sang    History of Presenting Illness:   [name] [name] is a 51 y.o. male   Chief Complaint: chest pain  History obtained from: Patient.  Onset/Duration: yesterday  Quality: pain  Severity:moderate  Aggravating Factors: none  Alleviating Factors: none  Associated Symptoms: sob   Narrative/Additional Historical Findings: hx limited as pt is sleeping on bed and needs to be repeatedly awoken.  He reports chest pain for the last day with associated leg pain and sob.      PCP: Christa See, MD    Past Medical History:     Past Medical History:   Diagnosis Date   . Depression    . Gallstones    . Gastritis    . Heart murmur    . Renal calculus        Past Surgical History:   History reviewed. No pertinent surgical history.    Family History:     Family History   Problem Relation Age of Onset   . Diabetes Mother    . Hypertension Mother        Social History:     Social History     Social History   . Marital status: Single     Spouse name: N/A   . Number of children: N/A   . Years of education: N/A     Social History Main Topics   . Smoking status: Current Every Day Smoker     Packs/day: 1.00     Years: 0.50     Types: Cigarettes   . Smokeless tobacco: Never Used   . Alcohol use Yes      Comment: occasionally   . Drug use: Yes     Types: Marijuana, Cocaine   . Sexual activity: Not on file     Other Topics Concern   . Not on file     Social History Narrative   . No narrative on file       Allergies:     Allergies   Allergen Reactions   . Gluten      Pt states "white bread"   . Percocet [Oxycodone-Acetaminophen]    . Tylenol [Acetaminophen] Hives     Tyelnol#3, hives and throw up       Medications:     Discharge Medication List as of 04/19/2016  3:43 AM      CONTINUE these medications which have NOT CHANGED    Details   hydroCHLOROthiazide  (HYDRODIURIL) 12.5 MG tablet Take 1 tablet (12.5 mg total) by mouth daily.for 14 days, Starting Fri 04/06/2016, Until Fri 04/20/2016, Print      ibuprofen (ADVIL,MOTRIN) 600 MG tablet Take 1 tablet (600 mg total) by mouth every 6 (six) hours as needed for Pain., Starting Tue 04/17/2016, Print             Review of Systems:     Constitutional: No fever or chills.  Eyes: No discharge.   ENT: No ear pain or sore throat.  Cardiovascular: + chest pain.  No palpitations.  Respiratory: No cough.  + shortness of breath.  GI: No nausea, vomiting or diarrhea. No abdominal pain.  Genitourinary: No dysuria, hematuria or frequency.  Musculoskeletal: No neck or back pain.  Skin:No rash or skin lesions.  Neurologic: No headache or dizziness.  Psychiatric: + substance abuse.  Physical Exam:     Vitals:    04/19/16 0330   BP: 109/72   Pulse: 68   Resp: 16   Temp: 98.1 F (36.7 C)   SpO2: 98%       Constitutional: Vital signs reviewed. Very sleepy.  Refusing to put both legs on the bed.  Head: Normocephalic, atraumatic  Eyes: No conjunctival injection. No discharge.  ENT: Mucous membranes moist  Neck: Normal range of motion. Non-tender.  Respiratory/Chest: Clear to auscultation. No respiratory distress.   Cardiovascular: Regular rate and rhythm. No murmur.   Abdomen: Soft and non-tender. No guarding. No masses or hepatosplenomegaly.  Genitourinary: deferred.  Back: No CVA tenderness. No midline tenderness.  UpperExtremity: Grossly normal ROM. 2+ radial pulses.  LowerExtremity: No edema. No cyanosis.  Neurological: No focal motor deficits by observation. Speech slurred. Memory normal.  Skin: Warm and dry. No rash.  Lymphatic: No cervical lymphadenopathy.  Psychiatric: flat affect. Poor concentration.    Labs:     Results     Procedure Component Value Units Date/Time    Troponin I [[id number]] Collected:  04/19/16 0038    Specimen:  Blood Updated:  04/19/16 0211     Troponin I <0.01 ng/mL     Basic Metabolic Panel [[id number]]  (Abnormal)  Collected:  04/19/16 0038    Specimen:  Blood Updated:  04/19/16 0211     Glucose 146 (H) mg/dL      BUN 78.2 mg/dL      Creatinine 0.9 mg/dL      Calcium 9.0 mg/dL      Sodium 956 mEq/L      Potassium 4.2 mEq/L      Chloride 104 mEq/L      CO2 28 mEq/L      Anion Gap 6.0    Hemolysis index [[id number]] Collected:  04/19/16 0038     Updated:  04/19/16 0211     Hemolysis Index 7    GFR [[id number]] Collected:  04/19/16 0038     Updated:  04/19/16 0211     EGFR >60.0    CBC with differential [[id number]]  (Abnormal) Collected:  04/19/16 0038    Specimen:  Blood from Blood Updated:  04/19/16 0211     WBC 7.94 x10 3/uL      Hgb 11.1 (L) g/dL      Hematocrit 27.2 (L) %      Platelets 174 x10 3/uL      RBC 3.56 (L) x10 6/uL      MCV 92.4 fL      MCH 31.2 pg      MCHC 33.7 g/dL      RDW 13 %      MPV 10.0 fL      Neutrophils 59.6 %      Lymphocytes Automated 28.0 %      Monocytes 8.2 %      Eosinophils Automated 3.4 %      Basophils Automated 0.4 %      Immature Granulocyte 0.4 %      Nucleated RBC 0.0 /100 WBC      Neutrophils Absolute 4.74 x10 3/uL      Abs Lymph Automated 2.22 x10 3/uL      Abs Mono Automated 0.65 x10 3/uL      Abs Eos Automated 0.27 x10 3/uL      Absolute Baso Automated 0.03 x10 3/uL      Absolute Immature Granulocyte 0.03 x10 3/uL      Absolute NRBC 0.00 x10  3/uL           Rads:     Radiology Results (24 Hour)     ** No results found for the last 24 hours. **            EKG:  NSR 85  Left axis deviation.  Monitor:  NSR 80    Procedures:       Assessment/Plan:   Pt slept for over 3 hrs in er.  Awoken.  Walked in er.  Gait steady.  Will [place] home.       1. Chest pain, unspecified type        Signed by: Toney Sang, PA-C       Toney Sang, [place]  04/19/16 0514       Ames Dura, DO  04/20/16 1610

## 2016-04-19 NOTE — Discharge Instructions (Signed)
Chest Pain of Unclear Etiology     You have been seen for chest pain. The cause of your pain is not yet known.     Your doctor has learned about your medical history, examined you, and checked any tests that were done. Still, it is unclear why you are having pain. The doctor thinks there is only a very small chance that your pain is caused by a life-threatening condition. Later, your primary care doctor might do more tests or check you again.     Sometimes chest pain is caused by a dangerous condition, like a heart attack, aorta injury, blood clot in the lung, or collapsed lung. It is unlikely that your pain is caused by a life-threatening condition if: Your chest pain lasts only a few seconds at a time; you are not short of breath, nauseated (sick to your stomach), sweaty, or lightheaded; your pain gets worse when you twist or bend; your pain improves with exercise or hard work.     Chest pain is serious. It is VERY IMPORTANT that you follow up with your regular doctor and seek medical attention immediately here or at the nearest Emergency Department if your symptoms become worse or they change.     YOU SHOULD SEEK MEDICAL ATTENTION IMMEDIATELY, EITHER HERE OR AT THE NEAREST EMERGENCY DEPARTMENT, IF ANY OF THE FOLLOWING OCCURS:  · Your pain gets worse.  · Your pain makes you short of breath, nauseated, or sweaty.  · Your pain gets worse when you walk, go up stairs, or exert yourself.  · You feel weak, lightheaded, or faint.  · It hurts to breathe.  · Your leg swells.  · Your symptoms get worse or you have new symptoms or concerns.

## 2016-04-19 NOTE — ED Notes (Signed)
Bed: NW29  Expected date:   Expected time:   Means of arrival:   Comments:  CN HOLD , DO NOT USE

## 2016-04-20 LAB — ECG 12-LEAD
Atrial Rate: 85 {beats}/min
P Axis: 59 degrees
P-R Interval: 148 ms
Q-T Interval: 372 ms
QRS Duration: 88 ms
QTC Calculation (Bezet): 442 ms
R Axis: -42 degrees
T Axis: 13 degrees
Ventricular Rate: 85 {beats}/min

## 2016-04-22 ENCOUNTER — Emergency Department: Payer: Charity

## 2016-04-22 ENCOUNTER — Emergency Department
Admission: EM | Admit: 2016-04-22 | Discharge: 2016-04-22 | Disposition: A | Discharge status: Home / Self Care | Payer: Charity | Attending: Emergency Medicine | Admitting: Emergency Medicine

## 2016-04-22 DIAGNOSIS — F17210 Nicotine dependence, cigarettes, uncomplicated: Secondary | ICD-10-CM | POA: Insufficient documentation

## 2016-04-22 DIAGNOSIS — R079 Chest pain, unspecified: Secondary | ICD-10-CM | POA: Insufficient documentation

## 2016-04-22 DIAGNOSIS — Z885 Allergy status to narcotic agent status: Secondary | ICD-10-CM | POA: Insufficient documentation

## 2016-04-22 LAB — CBC AND DIFFERENTIAL
Absolute NRBC: 0 10*3/uL
Basophils Absolute Automated: 0.06 10*3/uL (ref 0.00–0.20)
Basophils Automated: 0.3 %
Eosinophils Absolute Automated: 0.02 10*3/uL (ref 0.00–0.70)
Eosinophils Automated: 0.1 %
Hematocrit: 35.6 % — ABNORMAL LOW (ref 42.0–52.0)
Hgb: 12.3 g/dL — ABNORMAL LOW (ref 13.0–17.0)
Immature Granulocytes Absolute: 0.06 10*3/uL — ABNORMAL HIGH
Immature Granulocytes: 0.3 %
Lymphocytes Absolute Automated: 1.32 10*3/uL (ref 0.50–4.40)
Lymphocytes Automated: 7.6 %
MCH: 31.4 pg (ref 28.0–32.0)
MCHC: 34.6 g/dL (ref 32.0–36.0)
MCV: 90.8 fL (ref 80.0–100.0)
MPV: 10.3 fL (ref 9.4–12.3)
Monocytes Absolute Automated: 1.05 10*3/uL (ref 0.00–1.20)
Monocytes: 6 %
Neutrophils Absolute: 14.96 10*3/uL — ABNORMAL HIGH (ref 1.80–8.10)
Neutrophils: 85.7 %
Nucleated RBC: 0 /100 WBC (ref 0.0–1.0)
Platelets: 200 10*3/uL (ref 140–400)
RBC: 3.92 10*6/uL — ABNORMAL LOW (ref 4.70–6.00)
RDW: 13 % (ref 12–15)
WBC: 17.47 10*3/uL — ABNORMAL HIGH (ref 3.50–10.80)

## 2016-04-22 LAB — COMPREHENSIVE METABOLIC PANEL
ALT: 12 U/L (ref 0–55)
AST (SGOT): 21 U/L (ref 5–34)
Albumin/Globulin Ratio: 1.2 (ref 0.9–2.2)
Albumin: 4 g/dL (ref 3.5–5.0)
Alkaline Phosphatase: 82 U/L (ref 38–106)
Anion Gap: 9 (ref 5.0–15.0)
BUN: 7 mg/dL — ABNORMAL LOW (ref 9.0–28.0)
Bilirubin, Total: 1.2 mg/dL (ref 0.2–1.2)
CO2: 28 mEq/L (ref 22–29)
Calcium: 9.9 mg/dL (ref 8.5–10.5)
Chloride: 102 mEq/L (ref 100–111)
Creatinine: 0.9 mg/dL (ref 0.7–1.3)
Globulin: 3.4 g/dL (ref 2.0–3.6)
Glucose: 133 mg/dL — ABNORMAL HIGH (ref 70–100)
Potassium: 3.6 mEq/L (ref 3.5–5.1)
Protein, Total: 7.4 g/dL (ref 6.0–8.3)
Sodium: 139 mEq/L (ref 136–145)

## 2016-04-22 LAB — ECG 12-LEAD
Atrial Rate: 110 {beats}/min
P Axis: 65 degrees
P-R Interval: 150 ms
Q-T Interval: 340 ms
QRS Duration: 86 ms
QTC Calculation (Bezet): 460 ms
R Axis: -43 degrees
T Axis: 56 degrees
Ventricular Rate: 110 {beats}/min

## 2016-04-22 LAB — GFR: EGFR: >60

## 2016-04-22 LAB — TROPONIN I: Troponin I: <0.01 ng/mL (ref 0.00–0.09)

## 2016-04-22 LAB — HEMOLYSIS INDEX: Hemolysis Index: 4 (ref 0–18)

## 2016-04-22 MED ORDER — KETOROLAC TROMETHAMINE 30 MG/ML IJ SOLN
30.0000 mg | Freq: Once | INTRAMUSCULAR | Status: AC
Start: 2016-04-22 — End: 2016-04-22
  Administered 2016-04-22: 05:00:00 30 mg via INTRAVENOUS
  Filled 2016-04-22: qty 1

## 2016-04-22 MED ORDER — SODIUM CHLORIDE 0.9 % IV BOLUS
1000.0000 mL | Freq: Once | INTRAVENOUS | Status: AC
Start: 2016-04-22 — End: 2016-04-22
  Administered 2016-04-22: 05:00:00 1000 mL via INTRAVENOUS

## 2016-04-22 MED ORDER — LIDOCAINE VISCOUS 2 % MT SOLN
10.0000 mL | Freq: Once | OROMUCOSAL | Status: AC
Start: 2016-04-22 — End: 2016-04-22
  Administered 2016-04-22: 05:00:00 10 mL via OROMUCOSAL
  Filled 2016-04-22: qty 15

## 2016-04-22 MED ORDER — ALUM & MAG HYDROXIDE-SIMETH 200-200-20 MG/5ML PO SUSP
30.0000 mL | Freq: Once | ORAL | Status: AC
Start: 2016-04-22 — End: 2016-04-22
  Administered 2016-04-22: 05:00:00 30 mL via ORAL
  Filled 2016-04-22: qty 30

## 2016-04-22 NOTE — ED Provider Notes (Signed)
EMERGENCY DEPARTMENT HISTORY AND PHYSICAL EXAM     Physician/Midlevel provider first contact with patient: 04/22/16 0423         Date: 04/22/2016  Patient Name: [name] [name]    History of Presenting Illness     Chief Complaint   Patient presents with   . Chest Pain       History Provided By: Patient    Chief Complaint: chest pain   Onset: few hours pta   Timing: Gradual  Location: left shoulder right chest   Quality: Achy   Severity: Moderate  Modifying Factors:   Associated Symptoms:      Additional History: [name] [name] is a 51 y.o. male presenting to the ED with chest pain and shoulder pain, patient has been seen here multiple times in past for same. States was at a bar drinking when he got chest pain and shoulder pain, no palpitations, no fever or chills. No resp distress. Tired appearing and needs to be awoken up in between questions.     PCP: Pcp, Noneorunknown, MD      No current facility-administered medications for this encounter.      Current Outpatient Prescriptions   Medication Sig Dispense Refill   . ibuprofen (ADVIL,MOTRIN) 600 MG tablet Take 1 tablet (600 mg total) by mouth every 6 (six) hours as needed for Pain. 20 tablet 0       Past History     Past Medical History:  Past Medical History:   Diagnosis Date   . Depression    . Gallstones    . Gastritis    . Heart murmur    . Renal calculus        Past Surgical History:  History reviewed. No pertinent surgical history.    Family History:  Family History   Problem Relation Age of Onset   . Diabetes Mother    . Hypertension Mother        Social History:  Social History   Substance Use Topics   . Smoking status: Current Every Day Smoker     Packs/day: 1.00     Years: 0.50     Types: Cigarettes   . Smokeless tobacco: Never Used   . Alcohol use Yes      Comment: occasionally       Allergies:  Allergies   Allergen Reactions   . Gluten      Pt states "white bread"   . Percocet [Oxycodone-Acetaminophen]    . Tylenol [Acetaminophen] Hives     Tyelnol#3, hives  and throw up       Review of Systems     Review of Systems   Constitutional: Negative for chills, fever and malaise/fatigue.   Respiratory: Negative for cough, sputum production, shortness of breath and wheezing.    Cardiovascular: Positive for chest pain. Negative for palpitations, claudication and leg swelling.   Gastrointestinal: Negative for abdominal pain, diarrhea, nausea and vomiting.   Skin: Negative for itching and rash.   Neurological: Negative for dizziness, tingling, tremors, focal weakness, seizures, loss of consciousness, weakness and headaches.       Physical Exam   BP 130/72   Pulse 89   Temp 98.7 F (37.1 C) (Oral)   Resp 18   Ht 5\' 8"  (1.727 m)   Wt 77.1 kg   SpO2 97%   BMI 25.85 kg/m     Physical Exam   Constitutional: He is oriented to person, place, and time and well-developed, well-nourished, and in  no distress. No distress.   HENT:   Head: Normocephalic and atraumatic.   Mouth/Throat: Oropharynx is clear and moist. No oropharyngeal exudate.   Eyes: Conjunctivae and EOM are normal. Pupils are equal, round, and reactive to light. Right eye exhibits no discharge. Left eye exhibits no discharge.   Neck: Normal range of motion. Neck supple.   Cardiovascular: Normal rate, regular rhythm, normal heart sounds and intact distal pulses.    No murmur heard.  Pulmonary/Chest: Effort normal and breath sounds normal. No stridor. No respiratory distress. He exhibits no tenderness.   Abdominal: Soft. Bowel sounds are normal. He exhibits no distension. There is no tenderness.   Lymphadenopathy:     He has no cervical adenopathy.   Neurological: He is alert and oriented to person, place, and time. No cranial nerve deficit. Gait normal. Coordination normal. GCS score is 15.   Skin: Skin is warm and dry. No rash noted. He is not diaphoretic. No erythema. No pallor.   Nursing note and vitals reviewed.      Diagnostic Study Results     Labs -     Results     Procedure Component Value Units Date/Time     Troponin I [[id number]] Collected:  04/22/16 0453    Specimen:  Blood Updated:  04/22/16 0529     Troponin I <0.01 ng/mL     Comprehensive metabolic panel [[id number]]  (Abnormal) Collected:  04/22/16 0453    Specimen:  Blood Updated:  04/22/16 0521     Glucose 133 (H) mg/dL      BUN 7.0 (L) mg/dL      Creatinine 0.9 mg/dL      Sodium 782 mEq/L      Potassium 3.6 mEq/L      Chloride 102 mEq/L      CO2 28 mEq/L      Calcium 9.9 mg/dL      Protein, Total 7.4 g/dL      Albumin 4.0 g/dL      AST (SGOT) 21 U/L      ALT 12 U/L      Alkaline Phosphatase 82 U/L      Bilirubin, Total 1.2 mg/dL      Globulin 3.4 g/dL      Albumin/Globulin Ratio 1.2     Anion Gap 9.0    Hemolysis index [[id number]] Collected:  04/22/16 0453     Updated:  04/22/16 0521     Hemolysis Index 4    GFR [[id number]] Collected:  04/22/16 0453     Updated:  04/22/16 0521     EGFR >60.0    CBC with differential [[id number]]  (Abnormal) Collected:  04/22/16 0453    Specimen:  Blood from Blood Updated:  04/22/16 0510     WBC 17.47 (H) x10 3/uL      Hgb 12.3 (L) g/dL      Hematocrit 01.0 (L) %      Platelets 200 x10 3/uL      RBC 3.92 (L) x10 6/uL      MCV 90.8 fL      MCH 31.4 pg      MCHC 34.6 g/dL      RDW 13 %      MPV 10.3 fL      Neutrophils 85.7 %      Lymphocytes Automated 7.6 %      Monocytes 6.0 %      Eosinophils Automated 0.1 %      Basophils Automated 0.3 %  Immature Granulocyte 0.3 %      Nucleated RBC 0.0 /100 WBC      Neutrophils Absolute 14.96 (H) x10 3/uL      Abs Lymph Automated 1.32 x10 3/uL      Abs Mono Automated 1.05 x10 3/uL      Abs Eos Automated 0.02 x10 3/uL      Absolute Baso Automated 0.06 x10 3/uL      Absolute Immature Granulocyte 0.06 (H) x10 3/uL      Absolute NRBC 0.00 x10 3/uL           Radiologic Studies -   Radiology Results (24 Hour)     Procedure Component Value Units Date/Time    XR Chest 2 Views [[id number]] Collected:  04/22/16 0452    Order Status:  Completed Updated:  04/22/16 0457    Narrative:       Examination:  Frontal lateral chest.    HISTORY: Pain unspecified.    COMPARISON: 04/17/2016.    FINDINGS:  Lungs, costophrenic angles clear.  Heart size normal.  Bones unremarkable.      Impression:         No acute cardiopulmonary process or change.    Adaline Sill, MD   04/22/2016 4:53 AM      .    Medical Decision Making   I am the first provider for this patient.    I reviewed the vital signs, available nursing notes, past medical history, past surgical history, family history and social history.    Vital Signs-Reviewed the patient's vital signs.     Patient Vitals for the past 12 hrs:   BP Temp Pulse Resp   04/22/16 0622 130/72 98.7 F (37.1 C) 89 18   04/22/16 0550 139/67 - 88 -   04/22/16 0415 121/78 99.3 F (37.4 C) (!) 113 20       Pulse Oximetry Analysis - Normal 97% on ra    Procedures:    Core Measures:    Critical Care Time:     Old Medical Records: Nursing notes.     ED Course:     Provider Notes: chest pain, trop negative, cxr wnl, sleeping at time of discharge. Well appearing, to see pcp in 2 days, strict return precautions discussed.     Diagnosis     Clinical Impression:   1. Chest pain, unspecified type        Treatment Plan:   ED Disposition     ED Disposition Condition Date/Time Comment    Discharge  Sun Apr 22, 2016  6:12 AM [name] [name] discharge to home/self care.    Condition at disposition: Stable                 Heriberto Antigua, [place]  04/22/16 4098       Westley Foots, MD  04/22/16 [phone]

## 2016-04-22 NOTE — ED Triage Notes (Signed)
[name] [name] is a 51 y.o. male c/o chest pain that started 40 mins PTA. Pt reports left breast  Pain radiating to left shoulder. Pt reports dizziness and nausea.

## 2016-04-22 NOTE — Discharge Instructions (Signed)
Chest Pain of Unclear Etiology     You have been seen for chest pain. The cause of your pain is not yet known.     Your doctor has learned about your medical history, examined you, and checked any tests that were done. Still, it is unclear why you are having pain. The doctor thinks there is only a very small chance that your pain is caused by a life-threatening condition. Later, your primary care doctor might do more tests or check you again.     Sometimes chest pain is caused by a dangerous condition, like a heart attack, aorta injury, blood clot in the lung, or collapsed lung. It is unlikely that your pain is caused by a life-threatening condition if: Your chest pain lasts only a few seconds at a time; you are not short of breath, nauseated (sick to your stomach), sweaty, or lightheaded; your pain gets worse when you twist or bend; your pain improves with exercise or hard work.     Chest pain is serious. It is VERY IMPORTANT that you follow up with your regular doctor and seek medical attention immediately here or at the nearest Emergency Department if your symptoms become worse or they change.     YOU SHOULD SEEK MEDICAL ATTENTION IMMEDIATELY, EITHER HERE OR AT THE NEAREST EMERGENCY DEPARTMENT, IF ANY OF THE FOLLOWING OCCURS:  · Your pain gets worse.  · Your pain makes you short of breath, nauseated, or sweaty.  · Your pain gets worse when you walk, go up stairs, or exert yourself.  · You feel weak, lightheaded, or faint.  · It hurts to breathe.  · Your leg swells.  · Your symptoms get worse or you have new symptoms or concerns.

## 2016-04-23 ENCOUNTER — Emergency Department: Payer: Charity

## 2016-04-23 ENCOUNTER — Emergency Department
Admission: EM | Admit: 2016-04-23 | Discharge: 2016-04-23 | Disposition: A | Discharge status: Home / Self Care | Payer: Charity | Attending: Emergency Medicine | Admitting: Emergency Medicine

## 2016-04-23 DIAGNOSIS — M25512 Pain in left shoulder: Secondary | ICD-10-CM | POA: Insufficient documentation

## 2016-04-23 DIAGNOSIS — W19XXXA Unspecified fall, initial encounter: Secondary | ICD-10-CM | POA: Insufficient documentation

## 2016-04-23 DIAGNOSIS — F17210 Nicotine dependence, cigarettes, uncomplicated: Secondary | ICD-10-CM | POA: Insufficient documentation

## 2016-04-23 DIAGNOSIS — Z79899 Other long term (current) drug therapy: Secondary | ICD-10-CM | POA: Insufficient documentation

## 2016-04-23 DIAGNOSIS — R0789 Other chest pain: Secondary | ICD-10-CM | POA: Insufficient documentation

## 2016-04-23 DIAGNOSIS — Y92538 Other ambulatory health services establishments as the place of occurrence of the external cause: Secondary | ICD-10-CM | POA: Insufficient documentation

## 2016-04-23 LAB — GFR: EGFR: >60

## 2016-04-23 LAB — CBC AND DIFFERENTIAL
Absolute NRBC: 0 10*3/uL
Basophils Absolute Automated: 0.04 10*3/uL (ref 0.00–0.20)
Basophils Automated: 0.6 %
Eosinophils Absolute Automated: 0.14 10*3/uL (ref 0.00–0.70)
Eosinophils Automated: 1.9 %
Hematocrit: 33.2 % — ABNORMAL LOW (ref 42.0–52.0)
Hgb: 11.4 g/dL — ABNORMAL LOW (ref 13.0–17.0)
Immature Granulocytes Absolute: 0.01 10*3/uL
Immature Granulocytes: 0.1 %
Lymphocytes Absolute Automated: 1.87 10*3/uL (ref 0.50–4.40)
Lymphocytes Automated: 25.8 %
MCH: 31.5 pg (ref 28.0–32.0)
MCHC: 34.3 g/dL (ref 32.0–36.0)
MCV: 91.7 fL (ref 80.0–100.0)
MPV: 10 fL (ref 9.4–12.3)
Monocytes Absolute Automated: 0.74 10*3/uL (ref 0.00–1.20)
Monocytes: 10.2 %
Neutrophils Absolute: 4.44 10*3/uL (ref 1.80–8.10)
Neutrophils: 61.4 %
Nucleated RBC: 0 /100 WBC (ref 0.0–1.0)
Platelets: 182 10*3/uL (ref 140–400)
RBC: 3.62 10*6/uL — ABNORMAL LOW (ref 4.70–6.00)
RDW: 13 % (ref 12–15)
WBC: 7.24 10*3/uL (ref 3.50–10.80)

## 2016-04-23 LAB — COMPREHENSIVE METABOLIC PANEL
ALT: 13 U/L (ref 0–55)
AST (SGOT): 19 U/L (ref 5–34)
Albumin/Globulin Ratio: 1.3 (ref 0.9–2.2)
Albumin: 3.7 g/dL (ref 3.5–5.0)
Alkaline Phosphatase: 73 U/L (ref 38–106)
Anion Gap: 7 (ref 5.0–15.0)
BUN: 12 mg/dL (ref 9–28)
Bilirubin, Total: 0.6 mg/dL (ref 0.2–1.2)
CO2: 26 mEq/L (ref 22–29)
Calcium: 8.9 mg/dL (ref 8.5–10.5)
Chloride: 105 mEq/L (ref 100–111)
Creatinine: 0.8 mg/dL (ref 0.7–1.3)
Globulin: 2.9 g/dL (ref 2.0–3.6)
Glucose: 90 mg/dL (ref 70–100)
Potassium: 3.5 mEq/L (ref 3.5–5.1)
Protein, Total: 6.6 g/dL (ref 6.0–8.3)
Sodium: 138 mEq/L (ref 136–145)

## 2016-04-23 LAB — PT AND APTT
PT INR: 1.1 (ref 0.9–1.1)
PT: 14 s (ref 12.6–15.0)
PTT: 30 s (ref 23–37)

## 2016-04-23 LAB — TROPONIN I: Troponin I: <0.01 ng/mL (ref 0.00–0.09)

## 2016-04-23 LAB — IHS D-DIMER: D-Dimer: 0.35 ug/mL FEU (ref 0.00–0.70)

## 2016-04-23 MED ORDER — CYCLOBENZAPRINE HCL 10 MG PO TABS
10.0000 mg | ORAL_TABLET | Freq: Three times a day (TID) | ORAL | 0 refills | Status: DC | PRN
Start: 2016-04-23 — End: 2019-02-11

## 2016-04-23 MED ORDER — FAMOTIDINE 10 MG/ML IV SOLN (WRAP)
20.0000 mg | Freq: Once | INTRAVENOUS | Status: DC
Start: 2016-04-23 — End: 2016-04-24
  Filled 2016-04-23: qty 2

## 2016-04-23 MED ORDER — SODIUM CHLORIDE 0.9 % IV BOLUS
1000.0000 mL | Freq: Once | INTRAVENOUS | Status: AC
Start: 2016-04-23 — End: 2016-04-23
  Administered 2016-04-23: 20:00:00 1000 mL via INTRAVENOUS

## 2016-04-23 MED ORDER — KETOROLAC TROMETHAMINE 30 MG/ML IJ SOLN
15.0000 mg | Freq: Once | INTRAMUSCULAR | Status: AC
Start: 2016-04-23 — End: 2016-04-23
  Administered 2016-04-23: 20:00:00 15 mg via INTRAVENOUS
  Filled 2016-04-23: qty 1

## 2016-04-23 MED ORDER — CYCLOBENZAPRINE HCL 10 MG PO TABS
10.0000 mg | ORAL_TABLET | Freq: Once | ORAL | Status: AC
Start: 2016-04-23 — End: 2016-04-23
  Administered 2016-04-23: 23:00:00 10 mg via ORAL
  Filled 2016-04-23: qty 1

## 2016-04-23 NOTE — ED Provider Notes (Signed)
EMERGENCY DEPARTMENT NOTE    Physician/Midlevel provider first contact with patient: 04/23/16 1924         HISTORY OF PRESENT ILLNESS   Historian:Patient  Translator Used: No    Chief Complaint: Shortness of Breath and Leg Pain     Mechanism of Injury:       51 y.o. male presents to the ED complaining of left sided chest pain worse than normal.  Complaining of pain in his head, L shoulder and arm, and L leg.  No n/v.  + lightheadedness.  No cough, + SOB.      1. Location of symptoms: see above  2. Onset of symptoms: 1 hour PTA  3. What was patient doing when symptoms started (Context): see above  4. Severity: moderate  5. Timing: constant  6. Activities that worsen symptoms: nothing  7. Activities that improve symptoms: nothing  8. Quality: sharp  9. Radiation of symptoms: no  10. Associated signs and Symptoms: see above  11. Are symptoms worsening? yes  MEDICAL HISTORY     Past Medical History:  Past Medical History:   Diagnosis Date   . Depression    . Gallstones    . Gastritis    . Heart murmur    . Renal calculus        Past Surgical History:  History reviewed. No pertinent surgical history.    Social History:  Social History     Social History   . Marital status: Single     Spouse name: N/A   . Number of children: N/A   . Years of education: N/A     Occupational History   . Not on file.     Social History Main Topics   . Smoking status: Current Every Day Smoker     Packs/day: 1.00     Years: 0.50     Types: Cigarettes   . Smokeless tobacco: Never Used   . Alcohol use Yes      Comment: occasionally   . Drug use: Yes     Types: Marijuana, Cocaine   . Sexual activity: Not on file     Other Topics Concern   . Not on file     Social History Narrative   . No narrative on file       Family History:  Family History   Problem Relation Age of Onset   . Diabetes Mother    . Hypertension Mother        Outpatient Medication:  Previous Medications    IBUPROFEN (ADVIL,MOTRIN) 600 MG TABLET    Take 1 tablet (600 mg total) by  mouth every 6 (six) hours as needed for Pain.         REVIEW OF SYSTEMS   Review of Systems   Constitutional: Negative.  Negative for chills and fever.   Respiratory: Positive for shortness of breath.    Cardiovascular: Positive for chest pain and leg swelling.   Neurological: Positive for headaches.   All other systems reviewed and are negative.       PHYSICAL EXAM     ED Triage Vitals [04/23/16 1922]   Enc Vitals Group      BP 150/78      Heart Rate 95      Resp Rate 22      Temp 99.2 F (37.3 C)      Temp Source Oral      SpO2 96 %      Weight  Height       Head Circumference       Peak Flow       Pain Score       Pain Loc       Pain Edu?       Excl. in GC?      Physical Exam   Constitutional: He is oriented to person, place, and time. He appears well-developed and well-nourished. No distress.   HENT:   Head: Normocephalic and atraumatic.   Eyes: Conjunctivae are normal. Pupils are equal, round, and reactive to light.   Neck: Normal range of motion. Neck supple.   Cardiovascular: Normal rate, regular rhythm and normal heart sounds.    Pulmonary/Chest: Effort normal and breath sounds normal.   Abdominal: Soft. There is no tenderness. There is no rebound and no guarding.   Musculoskeletal:   LLE varicose veins and tenderness   Neurological: He is alert and oriented to person, place, and time.   Skin: Skin is warm and dry. Capillary refill takes less than 2 seconds. No erythema.   Nursing note and vitals reviewed.      MEDICAL DECISION MAKING     DISCUSSION    Patient with h/o chest pain and SOB returns for continued symptoms.  Given toradol in ED.  ECG unchanged from prior.  Labs with negative troponin, no anemia, normal electrolytes and hepatorenal function.  No PNA or PTX on XR.  [place] for LLE tenderness ordered and patient signed out to Dr. Lowell Guitar pending results.        Vital Signs: Reviewed the patient?s vital signs.   Nursing Notes: Reviewed and utilized available nursing notes.  Medical Records Reviewed:  Reviewed available past medical records.  Counseling: The emergency provider has spoken with the patient and discussed today?s findings, in addition to providing specific details for the plan of care.  Questions are answered and there is agreement with the plan.          CARDIAC STUDIES    The following cardiac studies were independently interpreted by the Emergency Medicine Physician.  For full cardiac study results please see chart.    EKG Interpretation:  Signed and interpreted byED Physician   Time Interpreted: 1930  Comparison: 04/22/16  Rate: 88  Rhythm: NSR  Axis: LAD  Intervals: normal  Blocks: none  ST segments: normal   Interpretation: Nonspecific EKG; unchanged from prior      RADIOLOGY IMAGING STUDIES      XR Chest  AP Portable   Final Result    No  radiographic evidence of active lung disease.      Georgana Curio, MD    04/23/2016 8:24 PM      [place] VenoDopp Low Extremity Left    (Results Pending)           PULSE OXIMETRY    Oxygen Saturation by Pulse Oximetry: 96%  Interventions: none  Interpretation:  Normal     EMERGENCY DEPT. MEDICATIONS      ED Medication Orders     Start Ordered     Status Ordering Provider    04/23/16 2051 04/23/16 2050  famotidine (PEPCID) injection 20 mg  Once     Route: Intravenous  Ordered Dose: 20 mg     Last MAR action:  Not Given Azalia Bilis [facility]    04/23/16 1948 04/23/16 1947  ketorolac (TORADOL) injection 15 mg  Once     Route: Intravenous  Ordered Dose: 15 mg  Last MAR action:  Given Larina Bras    04/23/16 1948 04/23/16 1947  sodium chloride 0.9 % bolus 1,000 mL  Once     Route: Intravenous  Ordered Dose: 1,000 mL     Last MAR action:  New Bag Corin Tilly KAEHLER          LABORATORY RESULTS    Ordered and independently interpreted AVAILABLE laboratory tests. Please see results section in chart for full details.  Results for orders placed or performed during the hospital encounter of 04/23/16   CBC with differential   Result Value Ref Range     WBC 7.24 3.50 - 10.80 x10 3/uL    Hgb 11.4 (L) 13.0 - 17.0 g/dL    Hematocrit 16.1 (L) 42.0 - 52.0 %    Platelets 182 140 - 400 x10 3/uL    RBC 3.62 (L) 4.70 - 6.00 x10 6/uL    MCV 91.7 80.0 - 100.0 fL    MCH 31.5 28.0 - 32.0 pg    MCHC 34.3 32.0 - 36.0 g/dL    RDW 13 12 - 15 %    MPV 10.0 9.4 - 12.3 fL    Neutrophils 61.4 None %    Lymphocytes Automated 25.8 None %    Monocytes 10.2 None %    Eosinophils Automated 1.9 None %    Basophils Automated 0.6 None %    Immature Granulocyte 0.1 None %    Nucleated RBC 0.0 0.0 - 1.0 /100 WBC    Neutrophils Absolute 4.44 1.80 - 8.10 x10 3/uL    Abs Lymph Automated 1.87 0.50 - 4.40 x10 3/uL    Abs Mono Automated 0.74 0.00 - 1.20 x10 3/uL    Abs Eos Automated 0.14 0.00 - 0.70 x10 3/uL    Absolute Baso Automated 0.04 0.00 - 0.20 x10 3/uL    Absolute Immature Granulocyte 0.01 0 x10 3/uL    Absolute NRBC 0.00 0 x10 3/uL   Comprehensive metabolic panel   Result Value Ref Range    Glucose 90 70 - 100 mg/dL    BUN 12 9 - 28 mg/dL    Creatinine 0.8 0.7 - 1.3 mg/dL    Sodium 096 045 - 409 mEq/L    Potassium 3.5 3.5 - 5.1 mEq/L    Chloride 105 100 - 111 mEq/L    CO2 26 22 - 29 mEq/L    Calcium 8.9 8.5 - 10.5 mg/dL    Protein, Total 6.6 6.0 - 8.3 g/dL    Albumin 3.7 3.5 - 5.0 g/dL    AST (SGOT) 19 5 - 34 U/L    ALT 13 0 - 55 U/L    Alkaline Phosphatase 73 38 - 106 U/L    Bilirubin, Total 0.6 0.2 - 1.2 mg/dL    Globulin 2.9 2.0 - 3.6 g/dL    Albumin/Globulin Ratio 1.3 0.9 - 2.2    Anion Gap 7.0 5.0 - 15.0   Troponin I   Result Value Ref Range    Troponin I <0.01 0.00 - 0.09 ng/mL   GFR   Result Value Ref Range    EGFR >60.0    D-Dimer   Result Value Ref Range    D-Dimer 0.35 0.00 - 0.70 ug/mL FEU   PT/APTT   Result Value Ref Range    PT 14.0 12.6 - 15.0 sec    PT INR 1.1 0.9 - 1.1    PT Anticoag. Given Within 48 hrs. None     PTT 30  23 - 37 sec   ECG 12 Lead   Result Value Ref Range    Ventricular Rate 88 BPM    Atrial Rate 88 BPM    P-R Interval 140 ms    QRS Duration 86 ms    Q-T  Interval 354 ms    QTC Calculation (Bezet) 428 ms    P Axis 56 degrees    R Axis -47 degrees    T Axis 40 degrees       CRITICAL CARE/PROCEDURES    Procedures    DIAGNOSIS      Diagnosis:  Final diagnoses:   None       Disposition:  ED Disposition     None          Prescriptions:  Patient's Medications   New Prescriptions    No medications on file   Previous Medications    IBUPROFEN (ADVIL,MOTRIN) 600 MG TABLET    Take 1 tablet (600 mg total) by mouth every 6 (six) hours as needed for Pain.   Modified Medications    No medications on file   Discontinued Medications    No medications on file            Larina Bras, MD  04/23/16 2113

## 2016-04-23 NOTE — ED Notes (Signed)
This RN head the sound sound of a trash can tipping over. Upon entering the room I found the patient awake and alert, lying supine. Pt asked what happened and he claims that he was reaching for "a tissue" above the sink and fell. Pt c/o pain to left shoulder and back. Pt assisted back in to bed with assistance from [place], RN, bed placed in lowest position with side rails up x 2. MD made aware of events.

## 2016-04-23 NOTE — Discharge Instructions (Signed)
Chest Pain of Unclear Etiology    You have been seen for chest pain. The cause of your pain is not yet known.    Your doctor has learned about your medical history, examined you, and checked any tests that were done. Still, it is unclear why you are having pain. The doctor thinks there is only a very small chance that your pain is caused by a life-threatening condition. Later, your primary care doctor might do more tests or check you again.    Sometimes chest pain is caused by a dangerous condition, like a heart attack, aorta injury, blood clot in the lung, or collapsed lung. It is unlikely that your pain is caused by a life-threatening condition if: Your chest pain lasts only a few seconds at a time; you are not short of breath, nauseated (sick to your stomach), sweaty, or lightheaded; your pain gets worse when you twist or bend; your pain improves with exercise or hard work.    Chest pain is serious. It is VERY IMPORTANT that you follow up with your regular doctor and seek medical attention immediately here or at the nearest Emergency Department if your symptoms become worse or they change.    YOU SHOULD SEEK MEDICAL ATTENTION IMMEDIATELY, EITHER HERE OR AT THE NEAREST EMERGENCY DEPARTMENT, IF ANY OF THE FOLLOWING OCCURS:   Your pain gets worse.   Your pain makes you short of breath, nauseated, or sweaty.   Your pain gets worse when you walk, go up stairs, or exert yourself.   You feel weak, lightheaded, or faint.   It hurts to breathe.   Your leg swells.   Your symptoms get worse or you have new symptoms or concerns.

## 2016-04-23 NOTE — ED Provider Notes (Signed)
Patient turned over to me pending [place] and disposition likely discharge. Patient reports L sided chest pain. He had a prior complaint of L shoulder pain and L leg pain as well. [place] and dimer are negative. Previously given toradol for pain   10P Patient reports he was attempting to get out of bed and fell onto his L side. Now with worse L shoulder pain and back pain. Examined by me without deformity, proximal humerus ttp. NV intact. Did not have direct lower back injury, nontender midline. XR L shoulder added    Nursing note and vitals reviewed.  Constitutional:  Well developed, well nourished. Awake & Oriented x3. Painful distress  Head:  Atraumatic. Normocephalic.    Eyes:  PERRL. EOMI. Conjunctivae are not pale.  ENT:  Mucous membranes are moist and intact. Oropharynx is clear and symmetric.  Patent airway.  Neck:  Supple. Full ROM.    Cardiovascular:  Regular rate. Regular rhythm. No murmurs, rubs, or gallops.  Pulmonary/Chest:  No evidence of respiratory distress. Clear to auscultation bilaterally.  No wheezing, rales or rhonchi.   Abdominal:  Soft and non-distended. There is no tenderness. No rebound, guarding, or rigidity.  Back:  Full ROM. Nontender midline L lumbar paraspinal ttp Motor 5/5 Sensation normal Pulses 2+  Extremities:  No edema. No cyanosis. No clubbing. Full range of motion in all extremities. L shoulder proximal humerus ttp No deformity Nontender elbow/wrist   Skin:  Skin is warm and dry.  No diaphoresis. No rash.   Neurological:  Alert, awake, and appropriate. Normal speech. Motor normal.  Psychiatric:  Good eye contact. Normal interaction, affect, and behavior.      1030P XR Shoulder is negative. Given flexiril for pain. Advised to take motrin/tylenol as needed for pain at home.   CP sounds atypical with prior visits for the same. EKG reviewed by myself without acute changes trop neg. No arrhythmia. No known CAD. Advised on follow up with the [facility]        Raliegh Ip, DO  04/23/16 2228

## 2016-04-25 LAB — ECG 12-LEAD
Atrial Rate: 88 {beats}/min
P Axis: 56 degrees
P-R Interval: 140 ms
Q-T Interval: 354 ms
QRS Duration: 86 ms
QTC Calculation (Bezet): 428 ms
R Axis: -47 degrees
T Axis: 40 degrees
Ventricular Rate: 88 {beats}/min

## 2019-01-02 ENCOUNTER — Emergency Department: Payer: Self-pay

## 2019-01-02 ENCOUNTER — Emergency Department
Admission: EM | Admit: 2019-01-02 | Discharge: 2019-01-02 | Disposition: A | Discharge status: Home / Self Care | Payer: Self-pay | Attending: Emergency Medicine | Admitting: Emergency Medicine

## 2019-01-02 DIAGNOSIS — R1084 Generalized abdominal pain: Secondary | ICD-10-CM | POA: Insufficient documentation

## 2019-01-02 DIAGNOSIS — F1110 Opioid abuse, uncomplicated: Secondary | ICD-10-CM | POA: Insufficient documentation

## 2019-01-02 DIAGNOSIS — F17210 Nicotine dependence, cigarettes, uncomplicated: Secondary | ICD-10-CM | POA: Insufficient documentation

## 2019-01-02 DIAGNOSIS — F1410 Cocaine abuse, uncomplicated: Secondary | ICD-10-CM | POA: Insufficient documentation

## 2019-01-02 DIAGNOSIS — F1910 Other psychoactive substance abuse, uncomplicated: Secondary | ICD-10-CM | POA: Insufficient documentation

## 2019-01-02 DIAGNOSIS — F23 Brief psychotic disorder: Secondary | ICD-10-CM | POA: Insufficient documentation

## 2019-01-02 DIAGNOSIS — R079 Chest pain, unspecified: Secondary | ICD-10-CM | POA: Insufficient documentation

## 2019-01-02 LAB — COMPREHENSIVE METABOLIC PANEL
ALT: 11 U/L (ref 0–55)
AST (SGOT): 15 U/L (ref 5–34)
Albumin/Globulin Ratio: 1.2 (ref 0.9–2.2)
Albumin: 3.9 g/dL (ref 3.5–5.0)
Alkaline Phosphatase: 79 U/L (ref 38–106)
Anion Gap: 8 (ref 5.0–15.0)
BUN: 12 mg/dL (ref 9.0–28.0)
Bilirubin, Total: 1.1 mg/dL (ref 0.2–1.2)
CO2: 29 mEq/L (ref 22–29)
Calcium: 8.9 mg/dL (ref 8.5–10.5)
Chloride: 101 mEq/L (ref 100–111)
Creatinine: 0.9 mg/dL (ref 0.7–1.3)
Globulin: 3.2 g/dL (ref 2.0–3.6)
Glucose: 99 mg/dL (ref 70–100)
Potassium: 3.9 mEq/L (ref 3.5–5.1)
Protein, Total: 7.1 g/dL (ref 6.0–8.3)
Sodium: 138 mEq/L (ref 136–145)

## 2019-01-02 LAB — CBC AND DIFFERENTIAL
Absolute NRBC: 0 10*3/uL (ref 0.00–0.00)
Basophils Absolute Automated: 0.04 10*3/uL (ref 0.00–0.08)
Basophils Automated: 0.5 %
Eosinophils Absolute Automated: 0.34 10*3/uL (ref 0.00–0.44)
Eosinophils Automated: 4.5 %
Hematocrit: 36.7 % — ABNORMAL LOW (ref 37.6–49.6)
Hgb: 12.3 g/dL — ABNORMAL LOW (ref 12.5–17.1)
Immature Granulocytes Absolute: 0.02 10*3/uL (ref 0.00–0.07)
Immature Granulocytes: 0.3 %
Lymphocytes Absolute Automated: 2.35 10*3/uL (ref 0.42–3.22)
Lymphocytes Automated: 30.9 %
MCH: 30.4 pg (ref 25.1–33.5)
MCHC: 33.5 g/dL (ref 31.5–35.8)
MCV: 90.8 fL (ref 78.0–96.0)
MPV: 9.9 fL (ref 8.9–12.5)
Monocytes Absolute Automated: 0.75 10*3/uL (ref 0.21–0.85)
Monocytes: 9.9 %
Neutrophils Absolute: 4.1 10*3/uL (ref 1.10–6.33)
Neutrophils: 53.9 %
Nucleated RBC: 0 /100 WBC (ref 0.0–0.0)
Platelets: 191 10*3/uL (ref 142–346)
RBC: 4.04 10*6/uL — ABNORMAL LOW (ref 4.20–5.90)
RDW: 13 % (ref 11–15)
WBC: 7.6 10*3/uL (ref 3.10–9.50)

## 2019-01-02 LAB — GFR: EGFR: >60

## 2019-01-02 LAB — HEMOLYSIS INDEX: Hemolysis Index: 12 (ref 0–18)

## 2019-01-02 LAB — ETHANOL: Alcohol: NOT DETECTED mg/dL

## 2019-01-02 LAB — TROPONIN I: Troponin I: 0.01 ng/mL (ref 0.00–0.05)

## 2019-01-02 LAB — LIPASE: Lipase: 24 U/L (ref 8–78)

## 2019-01-02 MED ORDER — LORAZEPAM 2 MG/ML IJ SOLN
1.00 mg | Freq: Once | INTRAMUSCULAR | Status: AC
Start: 2019-01-02 — End: 2019-01-02
  Administered 2019-01-02: 03:00:00 1 mg via INTRAVENOUS
  Filled 2019-01-02: qty 1

## 2019-01-02 NOTE — Discharge Instructions (Signed)
Abdominal Pain    You have been diagnosed with abdominal (belly) pain. The cause of your pain is not yet known.    Many things can cause abdominal pain such as infections and bowel (intestine) spasms. You might need another examination or more tests to find out why you have pain.    At this time, your pain does not seem to be caused by anything dangerous. You do not need surgery. You do not need to stay in the hospital.     Though we dont believe your condition is dangerous right now, it is important to be careful. Sometimes a problem that seems mild now can become serious later. If you do not get completely better or your symptoms get worse, you should seek more care. This is why it is important that you get additional help unless you are 100% improved   Follow up with your doctor.    For the next 24 hours, Drink only clear liquids such as:   Water.   Clear broth.   Sports drinks.   Clear caffeine-free soft drinks, like 7-Up or Sprite.    Return here or go to the nearest Emergency Department immediately if:   Your pain does not go away or gets worse.   You cannot keep fluids down    Your vomit (throw up) is dark green.    You vomit (throw up) blood or see blood in your stool (poop). Blood might be bright red or dark red. It can also be black and look like tar.   You have a fever (temperature higher than 100.39F / 38C) or shaking chills.   Your skin or eyes look yellow.   Your urine looks brown.   You have severe diarrhea.    If you can't follow up with your doctor, or if at any time you feel you need to be rechecked or seen again, come back here or go to the nearest emergency department.             Cocaine Chest Pain    You have been seen for chest pain. It is probably from cocaine use.    Cocaine is one of the most widely used illegal drugs. Crack is cocaine that can be smoked. Both cause the same problems. Cocaine has many bad effects on the body. Together, these effects can cause bad  heart damage and stroke. Some effects of cocaine are:   Making your heart beat very fast. Cocaine stimulates the heart. This can cause dangerous problems with the hearts electrical system. It can cause an arrhythmia. This is an out-of-control heartbeat. It sometimes causes sudden collapse and death.   Cocaine can cause your blood pressure to get very high. This makes the heart work harder. Some people get fluid in the lungs. This is because the heart cant pump the blood to the body very well. The fluid backs up into the lungs. This causes severe shortness of breath. This is called flash pulmonary edema.   Cocaine can also cause the blood vessels that bring blood to the heart to narrow. This narrowing is temporary and is called a spasm. The capillaries can also become blocked. These are very small blood vessels. Together these can lead to a heart attack.   Over the long term, cocaine use can cause bad coronary artery disease. It can make the disease be "premature," or early. This disease is also called hardening of the arteries. The arteries bringing blood to the heart and brain get  narrow. This is permanent. It is almost like a 54 year old having the blood vessels of a 54 year old.   Very high blood pressure and damaged arteries can cause a stroke.    Using cocaine with alcohol is especially serious. The two together make the heart rate and blood pressure get higher than with just cocaine use. Cocaine together with cigarettes is also serious. It causes the blood vessels around the heart to narrow even more. This increases the risk of heart attack.    Chest pain from cocaine use has many symptoms. Some are:   Chest pain, pressure or heaviness.   Pain that goes to the arms, neck, jaw or back.   Trouble breathing or shortness of breath.    We watched you closely to make sure you don't have serious problems from your cocaine use. We checked EKGs and blood tests. This was to check for immediate damage  to your heart like a heart attack. We also tried to control your pain, headache or high blood pressure. You may have had a stress test to find out more about your heart. If you had a stress test, you will get more information.     The testing showed you are not having a heart attack. It DOES NOT mean you dont have slow, ongoing damage from drug use.    It is OK for you to go home today.    Cocaine use is dangerous. It can be life-threatening. This is especially because of the effects it can have on your heart. Stop using cocaine and any other drugs. The fact you are in the hospital for your drug use shows that your problem with drugs is bad! There are treatment programs that can help. Ask your doctor for more information.    See your family doctor about your cocaine use and chest pain. Get involved in a drug treatment program to help with your drug use.    YOU SHOULD SEEK MEDICAL ATTENTION IMMEDIATELY, EITHER HERE OR AT THE NEAREST EMERGENCY DEPARTMENT, IF ANY OF THE FOLLOWING OCCUR:   You have chest pain.   Your get short of breath.   You have any other problems or concerns.             Substance / Drug Abuse    After evaluating you today, your doctor believes that you have a problem with substance abuse.    Substance abuse is a physical addiction to a drug. It is a serious problem that can be life-threatening. It can ruin your life as well as the lives of those who care about you.    It is important to have a counselor and a family doctor who see you on a regular basis. A counselor can help you with your problem, keep a close eye on you and follow your progress.    Stay with someone who can watch you tonight and who can help you avoid situations where you are likely to abuse again.    DO NOT DRIVE A VEHICLE UNDER THE INFLUENCE OF ALCOHOL OR OTHER ILLEGAL SUBSTANCES! YOU MAY INJURE OR KILL YOURSELF OR SOMEONE ELSE IF YOU DRINK OR USE AND DRIVE.    YOU SHOULD SEEK MEDICAL ATTENTION IMMEDIATELY, EITHER  HERE OR AT THE NEAREST EMERGENCY DEPARTMENT, IF ANY OF THE FOLLOWING OCCURS:   You think of harming yourself or committing suicide.    You feel unsafe in your home environment.   You become worse or feel that you cannot  wait until your follow-up appointment for treatment.     [facility] Detox Treatment    You have been referred for alcohol or substance abuse treatment. See referral phone numbers below. Please call as soon as possible to arrange for treatment.    DETOX PROGRAMS FOR UNINSURED PATIENTS  [facility]Social Detox.[phone]  [facility] Social Detox.. [phone]  [facility] Methadone [phone]    OUTPATIENT ALCOHOL TREATMENT CENTERS FOR UNINSURED PATIENTS  [facility] Services [phone]  [facility] ADS..[phone]  [place] [phone]  Lajoyce Lauber [address] [phone]  [place] ADS.Marland [phone]  [facility] ADS/Mental Health..[phone]      SUBSTANCE ABUSE SERVICES FOR INSURED PATIENTS    [facility] (COMPREHENSIVE ADDICTION TREATMENT SERVICES)  INPATIENT DETOX, DAY TREATMENT AND OUTPATIENT  [address]  [place], [place] [phone]    [facility] CENTER  INPATIENT DETOX AND DAY TREATMENT  [address]  [place], [place] [phone]    [facility]  INPATIENT DETOX  [address]  [place], [place] [phone]    [facility]  ALCOHOL/OPIATE  DETOX OUTPATIENT AND DAY TREATMENT  [phone] [place] red line access    [facility]  DAY TREATMENT AND SOBER LIVING  [id number]  [place] [address] [phone]      12 Step Meetings  AA Intergroup.. [phone]  Al-Anon.[phone]  D.[place]ena [place] AA Intergroup [phone]  [place] NA [phone]    Network of Care-http://[place].networkofcare.org  A resource for individuals, families and agencies concerned with mental health,  substance abuse, developmental disabilities, and co-occurring disorders.     2-1-1 [place] -http://211[place].org  2-1-1 is a phone and web-based service connecting people with free information on available and health community services.    Alcoholics Anonymous -http://aa.org  Alcoholics Anonymous (AA) is a worldwide fellowship of men and women who share a desire to stop drinking alcohol, and subsequently maintain their sobriety.    Narcotics Anonymous - https://kidd.org/  Narcotics Anonymous (NA) is a worldwide fellowship of men and women who share a desire to stop using drugs, and subsequently maintain their sobriety.    Narcotics Anonymous - [place] Chapter  SwimChampionship.fr  [phone]    Return immediately to the Emergency Department for:  - Increased shaking or tremors  - Seizure  - Uncontrollable vomiting  - Thoughts about hurting yourself or hurting someone else  - Abdominal pain

## 2019-01-02 NOTE — ED Triage Notes (Signed)
[name] [name] is a 53 y.o. male from home c/o abd pain x few days.  No meds taken.    BP 146/81    Pulse 75    Temp 98.6 F (37 C) (Oral)    Resp 18    Ht 5\' 9"  (1.753 m)    Wt 83.9 kg    SpO2 96%    BMI 27.32 kg/m

## 2019-01-02 NOTE — ED Provider Notes (Signed)
EMERGENCY DEPARTMENT HISTORY AND PHYSICAL EXAM    Date: 01/02/2019  Patient Name: [name] [name]  Attending Physician:  Lavonda Jumbo, MD, FACEP  Diagnosis and Treatment Plan       Clinical Impression:   Final diagnoses:   Polysubstance abuse   Chest pain in adult   Generalized abdominal pain   Brief psychotic disorder       Treatment Plan:   ED Disposition     ED Disposition Condition Date/Time Comment    Discharge  Fri Jan 02, 2019  3:58 AM [name] [name] discharge to home/self care.    Condition at disposition: Stable          History of Presenting Illness     Chief Complaint   Patient presents with    Abdominal Pain       History Provided By: Pt  Chief Complaint: Chest pain  Onset: Three days  Timing: Persistent   Location: Chest   Quality:    Severity: Moderate   Modifying Factors: Cocaine, heroin, and alcohol use  Associated sxs: Abd pain     Additional History: [name] [name] is a 53 y.o. male presents with chest pain and abd pain. Pt says sxs are worse after cocaine, heroin, and alcohol use. Pt stating he wants meds for sxs and says "go fuck yourself" to this provider when attempting to counsel him to stop using drugs.     PCP: Pcp, None, MD      No current facility-administered medications for this encounter.     Current Outpatient Medications:     cyclobenzaprine (FLEXERIL) 10 MG tablet, Take 1 tablet (10 mg total) by mouth 3 (three) times daily as needed for Muscle spasms., Disp: 20 tablet, Rfl: 0    ibuprofen (ADVIL,MOTRIN) 600 MG tablet, Take 1 tablet (600 mg total) by mouth every 6 (six) hours as needed for Pain., Disp: 20 tablet, Rfl: 0    ALL Past Medical History, Surgical History, Family History, and Social History available at  Physician/Midlevel provider first contact with patient: 01/02/19 0050      were reviewed at this time.    Past Medical History     Past Medical History:   Diagnosis Date    Depression     Gallstones     Gastritis     Heart murmur     Renal calculus      History reviewed.  No pertinent surgical history.    Family History     Family History   Problem Relation Age of Onset    Diabetes Mother     Hypertension Mother        Social History     Social History     Socioeconomic History    Marital status: Single     Spouse name: Not on file    Number of children: Not on file    Years of education: Not on file    Highest education level: Not on file   Occupational History    Not on file   Social Needs    Financial resource strain: Not on file    Food insecurity     Worry: Not on file     Inability: Not on file    Transportation needs     Medical: Not on file     Non-medical: Not on file   Tobacco Use    Smoking status: Current Every Day Smoker     Packs/day: 1.00     Years:  0.50     Pack years: 0.50     Types: Cigarettes    Smokeless tobacco: Never Used   Substance and Sexual Activity    Alcohol use: Not Currently    Drug use: Yes     Types: Marijuana, Cocaine     Comment: heroin     Sexual activity: Not on file   Lifestyle    Physical activity     Days per week: Not on file     Minutes per session: Not on file    Stress: Not on file   Relationships    Social connections     Talks on phone: Not on file     Gets together: Not on file     Attends religious service: Not on file     Active member of club or organization: Not on file     Attends meetings of clubs or organizations: Not on file     Relationship status: Not on file    Intimate partner violence     Fear of current or ex partner: Not on file     Emotionally abused: Not on file     Physically abused: Not on file     Forced sexual activity: Not on file   Other Topics Concern    Not on file   Social History Narrative    Not on file       Allergies     Allergies   Allergen Reactions    Gluten      Pt states "white bread"    Percocet [Oxycodone-Acetaminophen]     Tylenol [Acetaminophen] Hives     Tyelnol#3, hives and throw up       Review of Systems         Review of Systems   Constitutional: Negative for chills and  fever.   HENT: Negative for hearing loss and tinnitus.    Eyes: Negative for blurred vision and double vision.   Gastrointestinal: Negative for blood in stool and heartburn. + abd pain  Cardio: + chest pain  Genitourinary: Negative for dysuria and urgency.   Neurological: Negative for sensory change and speech change.   All other systems reviewed and are negative.        Physical Exam     BP 131/73    Pulse 76    Temp 98.6 F (37 C) (Oral)    Resp 18    Ht 5\' 9"  (1.753 m)    Wt 83.9 kg    SpO2 98%    BMI 27.32 kg/m   Pulse Oximetry Analysis - Normal         Physical Examination: General appearance - alert, intoxicated appearing, and in no distress  Mental status - alert, oriented to person, place, and time  Eyes - pupils equal and reactive, extraocular eye movements intact  Nose - normal and patent, no discharge  Neck - grossly normal rom  Chest - clear to auscultation, no wheezes, rales or rhonchi, symmetric air entry  Heart - normal rate, regular rhythm  Abdomen - soft, nontender, nondistended  Neurological - maex4, speech clear; pressured speech  NIHSS-0, grossly normal swallow study as patient handles secretions well  Musculoskeletal - no joint tenderness, deformity or swelling  Extremities - distal blood flow grossly normal, no pedal edema  Skin - normal coloration, no rashes where visualized   Psychiatric- alert, oriented, anxious and agitated  Smells of etoh      EKG:  Interpreted by EDP.  Sinus rhythm. Rate is normal. Nonspecific ST/Twave changes.  Monitor: Normal sinus rhythm.            Diagnostic Study Results     Labs -     Results     Procedure Component Value Units Date/Time    Troponin I [[id number]] Collected: 01/02/19 0231    Specimen: Blood Updated: 01/02/19 0307     Troponin I 0.01 ng/mL     Hemolysis index [[id number]] Collected: 01/02/19 0231     Updated: 01/02/19 0307     Hemolysis Index 12    GFR [[id number]] Collected: 01/02/19 0231     Updated: 01/02/19 0307     EGFR >60.0    Lipase  [[id number]] Collected: 01/02/19 0231    Specimen: Blood Updated: 01/02/19 0307     Lipase 24 U/L     Ethanol (Alcohol) Level [[id number]] Collected: 01/02/19 0231    Specimen: Blood Updated: 01/02/19 0307     Alcohol None Detected mg/dL     Comprehensive metabolic panel [[id number]] Collected: 01/02/19 0231    Specimen: Blood Updated: 01/02/19 0307     Glucose 99 mg/dL      BUN 25.9 mg/dL      Creatinine 0.9 mg/dL      Sodium 563 mEq/L      Potassium 3.9 mEq/L      Chloride 101 mEq/L      CO2 29 mEq/L      Calcium 8.9 mg/dL      Protein, Total 7.1 g/dL      Albumin 3.9 g/dL      AST (SGOT) 15 U/L      ALT 11 U/L      Alkaline Phosphatase 79 U/L      Bilirubin, Total 1.1 mg/dL      Globulin 3.2 g/dL      Albumin/Globulin Ratio 1.2     Anion Gap 8.0    CBC and differential [[id number]]  (Abnormal) Collected: 01/02/19 0231    Specimen: Blood Updated: 01/02/19 0241     WBC 7.60 x10 3/uL      Hgb 12.3 g/dL      Hematocrit 51.8 %      Platelets 191 x10 3/uL      RBC 4.04 x10 6/uL      MCV 90.8 fL      MCH 30.4 pg      MCHC 33.5 g/dL      RDW 13 %      MPV 9.9 fL      Neutrophils 53.9 %      Lymphocytes Automated 30.9 %      Monocytes 9.9 %      Eosinophils Automated 4.5 %      Basophils Automated 0.5 %      Immature Granulocytes 0.3 %      Nucleated RBC 0.0 /100 WBC      Neutrophils Absolute 4.10 x10 3/uL      Lymphocytes Absolute Automated 2.35 x10 3/uL      Monocytes Absolute Automated 0.75 x10 3/uL      Eosinophils Absolute Automated 0.34 x10 3/uL      Basophils Absolute Automated 0.04 x10 3/uL      Immature Granulocytes Absolute 0.02 x10 3/uL      Absolute NRBC 0.00 x10 3/uL           Radiologic Studies -   Radiology Results (24 Hour)     Procedure Component Value Units Date/Time    XR Chest AP Portable [[id number]]  Collected: 01/02/19 0210    Order Status: Completed Updated: 01/02/19 0213    Narrative:      Examination: Portable chest.    HISTORY: Diffuse pain.    COMPARISON: 04/23/2016.    FINDINGS:  Lungs, costophrenic  angles clear.  Heart size normal.  Bones no acute process.      Impression:        No pneumonia or pneumothorax.    Adaline Sill, MD   01/02/2019 2:11 AM      .    Doctor's Notes         The patient verbalizes understanding of the test results, short and long term treatment plan, indications to return to emergency department, need for immediate follow up, and possible medication side effects. The patient has decided to be discharged home at this time. The patient does not have a preferred pharmacy at this time and will be given paper prescriptions unless otherwise noted.         This patient has demonstrated clinical sobriety and safety by being able to accurately recount the events that brought them to the ED, normal gait and neurovascular status, aox3, no hi/si, no acute pain complaints, no signs of trauma, and has a safe place to go. They have been offered detox but refused. Patient warned not to operate machinery, firearms, or sharp objects and reconsider toxic lifestyle choices.         Refuses urine tests  Admits to heavy drug use daily and pta  Threatens to hurt anyone that stops him from leaving to continue his drug use    Escorted out by security          Attestations:     Physician/Midlevel provider first contact with patient: 01/02/19 0050         This note is prepared for Lavonda Jumbo, MD, FACEP. The scribe's documentation has been prepared under my direction and personally reviewed by me in its entirety.  I confirm that the note above accurately reflects all work, treatment, procedures, and medical decision making performed by me.     I am the first provider for this patient.      Lavonda Jumbo, MD, FACEP is the primary emergency doctor of record.      I reviewed the available vital signs, nursing notes, past medical history, past surgical history, family history and social history at the time of initial evaluation.    _______________________________             Donny Pique, MD  01/07/19  [phone]

## 2019-01-04 ENCOUNTER — Emergency Department: Payer: Self-pay

## 2019-01-04 ENCOUNTER — Emergency Department
Admission: EM | Admit: 2019-01-04 | Discharge: 2019-01-04 | Disposition: A | Discharge status: Home / Self Care | Payer: Self-pay | Attending: Emergency Medicine | Admitting: Emergency Medicine

## 2019-01-04 DIAGNOSIS — R4182 Altered mental status, unspecified: Secondary | ICD-10-CM | POA: Insufficient documentation

## 2019-01-04 DIAGNOSIS — Z8719 Personal history of other diseases of the digestive system: Secondary | ICD-10-CM | POA: Insufficient documentation

## 2019-01-04 DIAGNOSIS — F1911 Other psychoactive substance abuse, in remission: Secondary | ICD-10-CM | POA: Insufficient documentation

## 2019-01-04 DIAGNOSIS — F17210 Nicotine dependence, cigarettes, uncomplicated: Secondary | ICD-10-CM | POA: Insufficient documentation

## 2019-01-04 LAB — COMPREHENSIVE METABOLIC PANEL
ALT: 13 U/L (ref 0–55)
AST (SGOT): 19 U/L (ref 5–34)
Albumin/Globulin Ratio: 1.2 (ref 0.9–2.2)
Albumin: 3.8 g/dL (ref 3.5–5.0)
Alkaline Phosphatase: 79 U/L (ref 38–106)
Anion Gap: 9 (ref 5.0–15.0)
BUN: 14 mg/dL (ref 9.0–28.0)
Bilirubin, Total: 1 mg/dL (ref 0.2–1.2)
CO2: 25 mEq/L (ref 22–29)
Calcium: 8.6 mg/dL (ref 8.5–10.5)
Chloride: 105 mEq/L (ref 100–111)
Creatinine: 0.9 mg/dL (ref 0.7–1.3)
Globulin: 3.3 g/dL (ref 2.0–3.6)
Glucose: 176 mg/dL — ABNORMAL HIGH (ref 70–100)
Potassium: 4.2 mEq/L (ref 3.5–5.1)
Protein, Total: 7.1 g/dL (ref 6.0–8.3)
Sodium: 139 mEq/L (ref 136–145)

## 2019-01-04 LAB — GFR: EGFR: >60

## 2019-01-04 LAB — RAPID DRUG SCREEN, URINE
Barbiturate Screen, UR: NEGATIVE
Benzodiazepine Screen, UR: NEGATIVE
Cannabinoid Screen, UR: NEGATIVE
Cocaine, UR: NEGATIVE
Opiate Screen, UR: POSITIVE — AB
PCP Screen, UR: NEGATIVE
Urine Amphetamine Screen: NEGATIVE

## 2019-01-04 LAB — CBC AND DIFFERENTIAL
Absolute NRBC: 0 10*3/uL (ref 0.00–0.00)
Basophils Absolute Automated: 0.06 10*3/uL (ref 0.00–0.08)
Basophils Automated: 0.6 %
Eosinophils Absolute Automated: 0.13 10*3/uL (ref 0.00–0.44)
Eosinophils Automated: 1.2 %
Hematocrit: 38.2 % (ref 37.6–49.6)
Hgb: 12.4 g/dL — ABNORMAL LOW (ref 12.5–17.1)
Immature Granulocytes Absolute: 0.04 10*3/uL (ref 0.00–0.07)
Immature Granulocytes: 0.4 %
Lymphocytes Absolute Automated: 1.34 10*3/uL (ref 0.42–3.22)
Lymphocytes Automated: 12.8 %
MCH: 29.7 pg (ref 25.1–33.5)
MCHC: 32.5 g/dL (ref 31.5–35.8)
MCV: 91.6 fL (ref 78.0–96.0)
MPV: 10.6 fL (ref 8.9–12.5)
Monocytes Absolute Automated: 0.76 10*3/uL (ref 0.21–0.85)
Monocytes: 7.3 %
Neutrophils Absolute: 8.15 10*3/uL — ABNORMAL HIGH (ref 1.10–6.33)
Neutrophils: 77.7 %
Nucleated RBC: 0 /100 WBC (ref 0.0–0.0)
Platelets: 203 10*3/uL (ref 142–346)
RBC: 4.17 10*6/uL — ABNORMAL LOW (ref 4.20–5.90)
RDW: 13 % (ref 11–15)
WBC: 10.48 10*3/uL — ABNORMAL HIGH (ref 3.10–9.50)

## 2019-01-04 LAB — URINALYSIS REFLEX TO MICROSCOPIC EXAM - REFLEX TO CULTURE
Bilirubin, UA: NEGATIVE
Blood, UA: NEGATIVE
Glucose, UA: NEGATIVE
Ketones UA: NEGATIVE
Leukocyte Esterase, UA: NEGATIVE
Nitrite, UA: NEGATIVE
Protein, UR: 30 — AB
Specific Gravity UA: 1.024 (ref 1.001–1.035)
Urine pH: 6 (ref 5.0–8.0)
Urobilinogen, UA: NEGATIVE mg/dL (ref 0.2–2.0)

## 2019-01-04 LAB — BLOOD GAS, VENOUS
Base Excess, Ven: -0.6 mEq/L
HCO3, Ven: 25.5 mEq/L
O2 Sat, Venous: 98 %
Temperature: 37
Venous Total CO2: 51.9 mEq/L
pCO2, Venous: 50.5 mmHg
pH, Ven: 7.323
pO2, Venous: 125 mmHg

## 2019-01-04 LAB — ETHANOL: Alcohol: NOT DETECTED mg/dL

## 2019-01-04 LAB — SALICYLATE LEVEL: Salicylate Level: <5 mg/dL — ABNORMAL LOW (ref 15.0–30.0)

## 2019-01-04 LAB — GLUCOSE WHOLE BLOOD - POCT: Whole Blood Glucose POCT: 180 mg/dL — ABNORMAL HIGH (ref 70–100)

## 2019-01-04 LAB — ACETAMINOPHEN LEVEL: Acetaminophen Level: <7 ug/mL — ABNORMAL LOW (ref 10–30)

## 2019-01-04 LAB — HEMOLYSIS INDEX: Hemolysis Index: 12 (ref 0–18)

## 2019-01-04 LAB — TROPONIN I: Troponin I: <0.01 ng/mL (ref 0.00–0.05)

## 2019-01-04 MED ORDER — SODIUM CHLORIDE 0.9 % IV BOLUS
1000.00 mL | Freq: Once | INTRAVENOUS | Status: AC
Start: 2019-01-04 — End: 2019-01-04
  Administered 2019-01-04: 13:00:00 1000 mL via INTRAVENOUS

## 2019-01-04 NOTE — ED Provider Notes (Signed)
EMERGENCY DEPARTMENT HISTORY AND PHYSICAL EXAM     None        Date: 01/04/2019  Patient Name: [name] [name]    History of Presenting Illness     Chief Complaint   Patient presents with    Syncope       History Provided By: Patient    Chief Complaint: AMS      Additional History: [name] [name] is a 53 y.o. male presenting to the ED with swaying and brief periods of decreased responsiveness whole standing at [employer]. Pt states he did not fall, did not pass out, and also does not believe that he was unresponsive. He reports poor sleep for a couple days and snorted heroin 4 days ago. Denies other recent drug use, alcohol use. Denies cp, sob, fevers, cough, abd pain, n/v/d, covid+ close contacts. Has had 12 yrs of R shoulder pain, unchanged, intermittent at times.    Pt is asking for food    dexi ok per ems    PCP: Pcp, None, MD  SPECIALISTS:    No current facility-administered medications for this encounter.      Current Outpatient Medications   Medication Sig Dispense Refill    cyclobenzaprine (FLEXERIL) 10 MG tablet Take 1 tablet (10 mg total) by mouth 3 (three) times daily as needed for Muscle spasms. 20 tablet 0    ibuprofen (ADVIL,MOTRIN) 600 MG tablet Take 1 tablet (600 mg total) by mouth every 6 (six) hours as needed for Pain. 20 tablet 0       Past History     Past Medical History:  Past Medical History:   Diagnosis Date    Depression     Gallstones     Gastritis     Heart murmur     Renal calculus        Past Surgical History:  History reviewed. No pertinent surgical history.    Family History:  Family History   Problem Relation Age of Onset    Diabetes Mother     Hypertension Mother        Social History:  Social History     Tobacco Use    Smoking status: Current Every Day Smoker     Packs/day: 1.00     Years: 0.50     Pack years: 0.50     Types: Cigarettes    Smokeless tobacco: Never Used   Substance Use Topics    Alcohol use: Not Currently    Drug use: Yes     Types: Marijuana, Cocaine      Comment: heroin        Allergies:  Allergies   Allergen Reactions    Gluten      Pt states "white bread"    Percocet [Oxycodone-Acetaminophen]     Tylenol [Acetaminophen] Hives     Tyelnol#3, hives and throw up       Review of Systems     Review of Systems   Constitutional: Negative for fever.   Respiratory: Negative for cough and shortness of breath.    Cardiovascular: Negative for chest pain.   Gastrointestinal: Negative for abdominal pain, diarrhea, nausea and vomiting.   Musculoskeletal: Negative for back pain, neck pain and neck stiffness.   Neurological: Negative for dizziness, seizures, syncope, facial asymmetry, speech difficulty, weakness, light-headedness, numbness and headaches.   Psychiatric/Behavioral: Positive for sleep disturbance. Negative for confusion and suicidal ideas.   All other systems reviewed and are negative.  Physical Exam   BP 110/56    Pulse 73    Temp 98.9 F (37.2 C) (Oral)    Resp 12    Ht 5\' 8"  (1.727 m)    Wt 86.2 kg    SpO2 96%    BMI 28.89 kg/m     Physical Exam   Constitutional: Patient is oriented to person, place, and time and well-developed, well-nourished, and in no distress.   Head: Normocephalic and atraumatic.   Eyes: EOM are normal. Pupils are equal, round, and reactive to light. pink subconj. No scleral icterus  ENT: OP clear, MMM  Neck: Normal range of motion. Neck supple. No JVD  Cardiovascular: Normal rate and regular rhythm. No murmurs or rubs.  Pulmonary/Chest: Effort normal and breath sounds normal. No respiratory distress.   Abdominal: Soft. There is no tenderness. No rebound or guarding.  Musculoskeletal: Normal range of motion. No lower extremity edema.  Neurological: Patient is alert and oriented to person, place, and time. GCS score is 15. Normal speech. Mae without focal weakness. No drift bue or ble. Sensation intact to light touch bue and ble. Cn 2-12 intact  Skin: Skin is warm and dry.       Diagnostic Study Results     Labs -     Results      Procedure Component Value Units Date/Time    Urine Tox Screen (Rapid Drug Screen) [[id number]]  (Abnormal) Collected: 01/04/19 1444    Specimen: Urine Updated: 01/04/19 1502     Urine Amphetamine Screen Negative     Barbiturate Screen, UR Negative     Benzodiazepine Screen, UR Negative     Cannabinoid Screen, UR Negative     Cocaine, UR Negative     Opiate Screen, UR Positive     PCP Screen, UR Negative    Narrative:      Replace urinary catheter prior to obtaining the urine culture  if it has been in place for greater than or equal to 14  days:->N/A No Foley  Indications for U/A Reflex to Micro - Reflex to  Culture:->Suprapubic Pain/Tenderness or Dysuria  Rescheduled by [id number] at 01/04/2019 12:21 Reason: Difficult draw/Unable   to collect specimen    UA Reflex to Micro - Reflex to Culture [[id number]]  (Abnormal) Collected: 01/04/19 1444    Specimen: Urine, Clean Catch Updated: 01/04/19 1455     Urine Type Urine, Clean Ca     Color, UA Yellow     Clarity, UA Clear     Specific Gravity UA 1.024     Urine pH 6.0     Leukocyte Esterase, UA Negative     Nitrite, UA Negative     Protein, UR 30     Glucose, UA Negative     Ketones UA Negative     Urobilinogen, UA Negative mg/dL      Bilirubin, UA Negative     Blood, UA Negative     RBC, UA 0 - 2 /hpf      WBC, UA 0 - 5 /hpf      Hyaline Casts, UA 0 - 3 /lpf      Urine Mucus Present    Narrative:      Replace urinary catheter prior to obtaining the urine culture  if it has been in place for greater than or equal to 14  days:->N/A No Foley  Indications for U/A Reflex to Micro - Reflex to  Culture:->Suprapubic Pain/Tenderness or Dysuria  Rescheduled by [id number] at 01/04/2019 12:21  Reason: Difficult draw/Unable   to collect specimen    Salicylate level [[id number]]  (Abnormal) Collected: 01/04/19 1218    Specimen: Blood Updated: 01/04/19 1300     Salicylate Level <5.0 mg/dL     Narrative:      Replace urinary catheter prior to obtaining the urine culture  if it has been in place  for greater than or equal to 14  days:->N/A No Foley  Indications for U/A Reflex to Micro - Reflex to  Culture:->Suprapubic Pain/Tenderness or Dysuria    Hemolysis index [[id number]] Collected: 01/04/19 1218     Updated: 01/04/19 1300     Hemolysis Index 12    Narrative:      Replace urinary catheter prior to obtaining the urine culture  if it has been in place for greater than or equal to 14  days:->N/A No Foley  Indications for U/A Reflex to Micro - Reflex to  Culture:->Suprapubic Pain/Tenderness or Dysuria    GFR [[id number]] Collected: 01/04/19 1218     Updated: 01/04/19 1300     EGFR >60.0    Narrative:      Replace urinary catheter prior to obtaining the urine culture  if it has been in place for greater than or equal to 14  days:->N/A No Foley  Indications for U/A Reflex to Micro - Reflex to  Culture:->Suprapubic Pain/Tenderness or Dysuria    Comprehensive metabolic panel [[id number]]  (Abnormal) Collected: 01/04/19 1218    Specimen: Blood Updated: 01/04/19 1300     Glucose 176 mg/dL      BUN 56.3 mg/dL      Creatinine 0.9 mg/dL      Sodium 875 mEq/L      Potassium 4.2 mEq/L      Chloride 105 mEq/L      CO2 25 mEq/L      Calcium 8.6 mg/dL      Protein, Total 7.1 g/dL      Albumin 3.8 g/dL      AST (SGOT) 19 U/L      ALT 13 U/L      Alkaline Phosphatase 79 U/L      Bilirubin, Total 1.0 mg/dL      Globulin 3.3 g/dL      Albumin/Globulin Ratio 1.2     Anion Gap 9.0    Narrative:      Replace urinary catheter prior to obtaining the urine culture  if it has been in place for greater than or equal to 14  days:->N/A No Foley  Indications for U/A Reflex to Micro - Reflex to  Culture:->Suprapubic Pain/Tenderness or Dysuria    Troponin I [[id number]] Collected: 01/04/19 1218    Specimen: Blood Updated: 01/04/19 1300     Troponin I <0.01 ng/mL     Narrative:      Replace urinary catheter prior to obtaining the urine culture  if it has been in place for greater than or equal to 14  days:->N/A No Foley  Indications for U/A  Reflex to Micro - Reflex to  Culture:->Suprapubic Pain/Tenderness or Dysuria    Acetaminophen level [[id number]]  (Abnormal) Collected: 01/04/19 1218    Specimen: Blood Updated: 01/04/19 1300     Acetaminophen Level <7 ug/mL     Narrative:      Replace urinary catheter prior to obtaining the urine culture  if it has been in place for greater than or equal to 14  days:->N/A No Foley  Indications for U/A Reflex to Micro - Reflex to  Culture:->Suprapubic Pain/Tenderness or Dysuria  Ethanol (Alcohol) Level [[id number]] Collected: 01/04/19 1218    Specimen: Blood Updated: 01/04/19 1300     Alcohol None Detected mg/dL     Narrative:      Replace urinary catheter prior to obtaining the urine culture  if it has been in place for greater than or equal to 14  days:->N/A No Foley  Indications for U/A Reflex to Micro - Reflex to  Culture:->Suprapubic Pain/Tenderness or Dysuria    CBC and differential [[id number]]  (Abnormal) Collected: 01/04/19 1218    Specimen: Blood Updated: 01/04/19 1243     WBC 10.48 x10 3/uL      Hgb 12.4 g/dL      Hematocrit 29.5 %      Platelets 203 x10 3/uL      RBC 4.17 x10 6/uL      MCV 91.6 fL      MCH 29.7 pg      MCHC 32.5 g/dL      RDW 13 %      MPV 10.6 fL      Neutrophils 77.7 %      Lymphocytes Automated 12.8 %      Monocytes 7.3 %      Eosinophils Automated 1.2 %      Basophils Automated 0.6 %      Immature Granulocytes 0.4 %      Nucleated RBC 0.0 /100 WBC      Neutrophils Absolute 8.15 x10 3/uL      Lymphocytes Absolute Automated 1.34 x10 3/uL      Monocytes Absolute Automated 0.76 x10 3/uL      Eosinophils Absolute Automated 0.13 x10 3/uL      Basophils Absolute Automated 0.06 x10 3/uL      Immature Granulocytes Absolute 0.04 x10 3/uL      Absolute NRBC 0.00 x10 3/uL     Narrative:      Replace urinary catheter prior to obtaining the urine culture  if it has been in place for greater than or equal to 14  days:->N/A No Foley  Indications for U/A Reflex to Micro - Reflex  to  Culture:->Suprapubic Pain/Tenderness or Dysuria    Blood gas, venous [[id number]] Collected: 01/04/19 1218    Specimen: Blood Updated: 01/04/19 1235     pH, Ven 7.323     pCO2, Venous 50.5 mmHg      pO2, Venous 125.0 mmHg      HCO3, Ven 25.5 mEq/L      Venous Total CO2 51.9 mEq/L      Base Excess, Ven -0.6 mEq/L      O2 Sat, Venous 98.0 %      Temperature 37.0     VBG CollectionSite arm    Glucose Whole Blood - POCT [[id number]]  (Abnormal) Collected: 01/04/19 1158     Updated: 01/04/19 1200     Whole Blood Glucose POCT 180 mg/dL           Radiologic Studies -   Radiology Results (24 Hour)     Procedure Component Value Units Date/Time    XR Chest  AP Portable [[id number]] Collected: 01/04/19 1405    Order Status: Completed Updated: 01/04/19 1407    Narrative:      INDICATION: Syncope    COMPARISON: 01/02/2019    FINDINGS:  A single radiograph of the chest performed. Portable film.  The heart is normal in size.   The mediastinal and hilar structures are within normal limits.  The lung Year[place] are clear. No pleural effusion.  No pneumothorax.    The  visualized osseous structures demonstrates no acute abnormality.      Impression:       No active disease    Kinnie Feil, MD   01/04/2019 2:05 PM    CT Head WO Contrast [[id number]] Collected: 01/04/19 1235    Order Status: Completed Updated: 01/04/19 1239    Narrative:      HISTORY: Syncope    TECHNIQUE:  Non enhanced Computed tomography the head was performed at 5  mm slice thickness. A combination of automatic exposure control,  adjustment of the mA a and/or KVP according to the patient's size and or  use of iterative reconstruction technique was utilized.    PRIORS: 08/11/2015.    FINDINGS:   The ventricular system is normal in size, shape and contour. There is no  midline shift or herniation. No acute intracranial hemorrhage is seen.  There are no extra-axial fluid collections.   There is mucosal thickening involving the bilateral paranasal sinuses.  The mastoid air  cells are clear.    The bony structures are unremarkable.      Impression:       Paranasal sinus mucosal disease. No acute intracranial  abnormalities.    Georgana Curio, MD   01/04/2019 12:37 PM      .    Medical Decision Making   I am the first provider for this patient.    I reviewed the vital signs, available nursing notes, past medical history, past surgical history, family history and social history.    Vital Signs-Reviewed the patient's vital signs.     Patient Vitals for the past 12 hrs:   BP Temp Pulse Resp   01/04/19 1500 110/56 -- 73 --   01/04/19 1400 112/73 -- 72 --   01/04/19 1300 99/65 -- 81 --   01/04/19 1200 116/66 -- (!) 101 12   01/04/19 1152 (!) 136/95 98.9 F (37.2 C) (!) 112 18       Pulse Oximetry Analysis - Normal 96% on RA      EKG:  Interpreted by the EP.   Time Interpreted: 11:51   Rate: 108   Rhythm: Sinus Tachycardia     Interpretation: no stemi    Old Medical Records: Old medical records. Hx polysubstance abuse. fyi flag from 2018 for controlled substance concern    ED Course:   Ate and drank in ED. remained alert and oriented.    The patient verbalizes understanding of the test results, short and long term treatment plan, indications to return to emergency department, need for immediate follow up, and possible medication side effects. The patient agrees with discharge home at this time.    Provider Notes (Initial Assessment): hx substance abuse here for AMS at grocery store. Did not fall, no syncope per pt. Pt denied any sx, normal neuro exam. Not hypoglycemic. hct neg for ich. Doubt meningitis. Lytes ok. Likely 2/2 substance abuse. Prov           Diagnosis     Clinical Impression:   1. Altered mental status, unspecified altered mental status type        Treatment Plan:   ED Disposition     ED Disposition Condition Date/Time Comment    Discharge  Sun Jan 04, 2019  3:03 PM [name] [name] discharge to home/self care.    Condition at disposition: Stable             _______________________________  Attestations:  None        I am the first provider for this patient.    Marquette Old, MD is the primary emergency doctor of record.    I reviewed the vital signs, available nursing notes, past medical history, past surgical history, family history and social history.       Marquette Old, MD  01/04/19 [phone]

## 2019-01-04 NOTE — Discharge Instructions (Signed)
Altered Mental Status, Resolved    You have been seen for "Altered Mental Status."    Altered Mental Status is the medical term for a change in a person's thinking. Symptoms can include confusion and amnesia (memory loss). They also include lethargy (low energy) or agitation (restless).    Altered mental status has many causes. Some more common causes are:   Infection (often urine and lung infections).   Stroke, transient ischemic attacks (TIAs). These are sometimes called mini-strokes.   Fever (temperature higher than 100.4F / 38C).   Reaction to medicine (this can happen if too much pain or sedative medicine is taken).   There may be other causes as well.    Your symptoms have gotten better or gone away completely. The doctor has evaluated you and thinks it is OK for you to go home. Someone responsible should be there to help you if you get ill or need help. Please alert the staff if you feel you will be unsafe in your home environment tonight!    YOU SHOULD SEEK MEDICAL ATTENTION IMMEDIATELY, EITHER HERE OR AT THE NEAREST EMERGENCY DEPARTMENT, IF ANY OF THE FOLLOWING OCCURS:   Confusion, coma, agitation (feeling restless).   Fever (temperature higher than 100.4F / 38C), vomiting.   Severe headache.   Signs of stroke, weakness.

## 2019-01-04 NOTE — ED Triage Notes (Signed)
[name] [name] is a 53 y.o. male BIBA and [employer] for repeated syncope. Pt was found sitting but having repeated episodes of blacking out for a couple seconds at a time. VSS, dexi normal. Pt denies LOC but is unresponsive to stimuli briefly. Occurred over 10 times with EMS and happened 3 times within 10 minutes of being in the ED. Denies CP, SOB cough or fever. Complains of R shoulder pain.

## 2019-01-04 NOTE — ED Notes (Signed)
Bed: PU38  Expected date: 01/04/19  Expected time: 11:36 AM  Means of arrival: [id number] - [facility] Lang  Comments:  [facility]

## 2019-01-05 LAB — ECG 12-LEAD
Atrial Rate: 74 {beats}/min
P Axis: 51 degrees
P-R Interval: 162 ms
Q-T Interval: 382 ms
QRS Duration: 82 ms
QTC Calculation (Bezet): 424 ms
R Axis: -44 degrees
T Axis: 12 degrees
Ventricular Rate: 74 {beats}/min

## 2019-01-09 LAB — ECG 12-LEAD
Atrial Rate: 108 {beats}/min
P Axis: 54 degrees
P-R Interval: 162 ms
Q-T Interval: 348 ms
QRS Duration: 90 ms
QTC Calculation (Bezet): 466 ms
R Axis: -59 degrees
T Axis: 57 degrees
Ventricular Rate: 108 {beats}/min

## 2019-02-03 ENCOUNTER — Emergency Department
Admission: EM | Admit: 2019-02-03 | Discharge: 2019-02-03 | Disposition: A | Discharge status: Home / Self Care | Payer: Self-pay | Attending: Emergency Medicine | Admitting: Emergency Medicine

## 2019-02-03 ENCOUNTER — Emergency Department: Payer: Self-pay

## 2019-02-03 DIAGNOSIS — R9431 Abnormal electrocardiogram [ECG] [EKG]: Secondary | ICD-10-CM

## 2019-02-03 DIAGNOSIS — R071 Chest pain on breathing: Secondary | ICD-10-CM | POA: Insufficient documentation

## 2019-02-03 DIAGNOSIS — Z86718 Personal history of other venous thrombosis and embolism: Secondary | ICD-10-CM | POA: Insufficient documentation

## 2019-02-03 DIAGNOSIS — R0789 Other chest pain: Secondary | ICD-10-CM

## 2019-02-03 DIAGNOSIS — F17200 Nicotine dependence, unspecified, uncomplicated: Secondary | ICD-10-CM | POA: Insufficient documentation

## 2019-02-03 DIAGNOSIS — F1910 Other psychoactive substance abuse, uncomplicated: Secondary | ICD-10-CM | POA: Insufficient documentation

## 2019-02-03 LAB — CBC AND DIFFERENTIAL
Absolute NRBC: 0 10*3/uL (ref 0.00–0.00)
Basophils Absolute Automated: 0.05 10*3/uL (ref 0.00–0.08)
Basophils Automated: 0.6 %
Eosinophils Absolute Automated: 0.44 10*3/uL (ref 0.00–0.44)
Eosinophils Automated: 5 %
Hematocrit: 40.4 % (ref 37.6–49.6)
Hgb: 13.3 g/dL (ref 12.5–17.1)
Immature Granulocytes Absolute: 0.03 10*3/uL (ref 0.00–0.07)
Immature Granulocytes: 0.3 %
Lymphocytes Absolute Automated: 2.48 10*3/uL (ref 0.42–3.22)
Lymphocytes Automated: 28.2 %
MCH: 30.5 pg (ref 25.1–33.5)
MCHC: 32.9 g/dL (ref 31.5–35.8)
MCV: 92.7 fL (ref 78.0–96.0)
MPV: 10.6 fL (ref 8.9–12.5)
Monocytes Absolute Automated: 0.66 10*3/uL (ref 0.21–0.85)
Monocytes: 7.5 %
Neutrophils Absolute: 5.12 10*3/uL (ref 1.10–6.33)
Neutrophils: 58.4 %
Nucleated RBC: 0 /100 WBC (ref 0.0–0.0)
Platelets: 205 10*3/uL (ref 142–346)
RBC: 4.36 10*6/uL (ref 4.20–5.90)
RDW: 13 % (ref 11–15)
WBC: 8.78 10*3/uL (ref 3.10–9.50)

## 2019-02-03 LAB — HEMOLYSIS INDEX: Hemolysis Index: 19 — ABNORMAL HIGH (ref 0–18)

## 2019-02-03 LAB — COMPREHENSIVE METABOLIC PANEL
ALT: 13 U/L (ref 0–55)
AST (SGOT): 17 U/L (ref 5–34)
Albumin/Globulin Ratio: 1.3 (ref 0.9–2.2)
Albumin: 4.4 g/dL (ref 3.5–5.0)
Alkaline Phosphatase: 85 U/L (ref 38–106)
Anion Gap: 11 (ref 5.0–15.0)
BUN: 9 mg/dL (ref 9.0–28.0)
Bilirubin, Total: 1 mg/dL (ref 0.2–1.2)
CO2: 28 mEq/L (ref 22–29)
Calcium: 9.4 mg/dL (ref 8.5–10.5)
Chloride: 99 mEq/L — ABNORMAL LOW (ref 100–111)
Creatinine: 0.9 mg/dL (ref 0.7–1.3)
Globulin: 3.3 g/dL (ref 2.0–3.6)
Glucose: 114 mg/dL — ABNORMAL HIGH (ref 70–100)
Potassium: 3.9 mEq/L (ref 3.5–5.1)
Protein, Total: 7.7 g/dL (ref 6.0–8.3)
Sodium: 138 mEq/L (ref 136–145)

## 2019-02-03 LAB — IHS D-DIMER: D-Dimer: 0.42 ug/mL FEU (ref 0.00–0.70)

## 2019-02-03 LAB — GFR: EGFR: >60

## 2019-02-03 LAB — TROPONIN I: Troponin I: <0.01 ng/mL (ref 0.00–0.05)

## 2019-02-03 MED ORDER — KETOROLAC TROMETHAMINE 30 MG/ML IJ SOLN
30.00 mg | Freq: Once | INTRAMUSCULAR | Status: AC
Start: 2019-02-03 — End: 2019-02-03
  Administered 2019-02-03: 18:00:00 30 mg via INTRAVENOUS
  Filled 2019-02-03: qty 1

## 2019-02-03 NOTE — ED Triage Notes (Signed)
EMERGENCY DEPARTMENT PIT NOTE    Patient Name: [name] [name] has had a rapid medical screening evaluation by myself for the chief complaint of left sided chest pain that now is more in center of chest x 30 minutes. Started while he was getting into car. Associated with SOB. Denies any prior MI, HTN, HLD, DM.      H/o left leg DVT 9 months ago. Was on anticoagulants but no longer. This occurred while incarcerated. Not taking asa.     Vitals: BP 152/90    Pulse 92    Temp 98.4 F (36.9 C) (Oral)    Resp 16    Ht 5\' 8"  (1.727 m)    Wt 88.5 kg    SpO2 100%    BMI 29.65 kg/m   Pertinent brief exam: varicose veins left leg. RRR. CTA.  Prelim orders: labs, ekg    Patient advised to remain in the ED until further evaluation can be performed. Patient instructed to notify staff of any changes in condition while waiting.    I am not the sole provider and this assessment is only an initial evaluation prior to full evaluation to expedite care.

## 2019-02-03 NOTE — ED Triage Notes (Addendum)
Reports left sided chest pain/palpitaion that started about 30 minutes ago while getting into his car. Also reports SOB HX blood clot in his leg

## 2019-02-03 NOTE — ED Provider Notes (Signed)
EMERGENCY DEPARTMENT HISTORY AND PHYSICAL EXAM    PPE: Mask, face shield and gloves were worn every time I entered the room.     History of Presenting Illness:  History Provided By: Patient    [name] [name] is a 53 y.o. male pw chest pain 30 minutes prior to arrival.  Patient reports pain started suddenly.  Started his left chest and radiated sternally.  He reports the pain is worse when he moves and stands up.  Is worse when he touches it.  It occurred when he was walking out of the store.  Denies any radiation to his back or shoulders.  History of blood clot in his left leg.  Not on blood thinners.  Also history of varicose veins and wearing compression stockings left lower extremity.    + smoking.  No known fam history of early CAD.    Reviewed and confirmed past medical, surgical, family and social history as documented.    PCP: Pcp, None, MD  SPECIALISTS:    Review of Systems:  Review of Systems   Constitutional: Negative for chills and fever.   Respiratory: Positive for shortness of breath. Negative for cough.    Cardiovascular: Positive for chest pain.   All other systems reviewed as negative.    Physical Exam:  Vitals:    02/03/19 1622 02/03/19 1622 02/03/19 1900   BP:  152/90    Pulse:  92 70   Resp:  16 17   Temp:  98.4 F (36.9 C)    TempSrc:  Oral    SpO2:  100% 99%   Weight: 88.5 kg     Height: 5\' 8"  (1.727 m)       Pulse Oximetry Interpretation: Normal     Physical Exam   Constitutional: Patient is alert.  Well nourished.  NAD  Head: Atraumatic.   Eyes: EOMI. PERRL  ENT:  MMM.   Neck:  FROM. No spinal tenderness. Neck supple.    Cardiovascular: Normal rate and regular rhythm.   Pulmonary/Chest: Effort normal and breath sounds normal. No respiratory distress.  Reproducible chest wall tenderness, mid sternum.   Abdominal: Soft. There is no tenderness. Bowel sounds present and normal.    Musculoskeletal:  No lower extremity edema or tenderness.    Neurological: Patient is alert and oriented to person,  place, and time.  No focal deficits.   Skin: Skin is warm and dry.      Cardiac Monitor:  Rate: 80s  Rhythm:  Normal Sinus Rhythm     EKG:  Interpreted by the EP.   Time Interpreted: 1730   Rate: 85   Rhythm: Normal Sinus Rhythm    Interpretation: NO ST/TW changes.    Comparison: Predominately unchanged from 12/2018    Old Medical Records: Old medical records.  Previous electrocardiograms.  Nursing notes.  Previous radiology studies.    Patient Update Notes:       Provider Notes: Reproducible chest pain.  EKG unchanged.  Troponin and D-dimer negative.  Chest x-ray clear.  Counseled on smoking cessation.  Will be prudent to follow cardiology based on risk factors and repeaded ED visits for chest pain.     Clinical Impression:   1. Costochondral pain    2. Tobacco use disorder    3. Polysubstance abuse        ED Disposition     ED Disposition Condition Date/Time Comment    Discharge  Tue Feb 03, 2019  6:32 PM [name] [name] discharge  to home/self care.    Condition at disposition: Stable          Smoking Cessation Counseling: The patient was counseled as to the multiple risks to his health from continued use of tobacco products.  It was explained that continuing to smoke may lead to multiple short and long term negative health consequences, including but not limited to mouth/esophageal/lung cancer, COPD, and heart disease.  He states he understands these risks, and also understands the options and resources available to him to help him stop smoking.  Nicotine replacement therapy, local hotlines, and local resources were discussed as viable options for helping him stop his tobacco use.  The total time spent counseling the patient regarding tobacco cessation was 5 minutes.          This note was generated by the Epic EMR system/ Dragon speech recognition and may contain inherent errors or omissions not intended by the user. Grammatical errors, random word insertions, deletions and pronoun errors  are occasional  consequences of this technology due to software limitations. Not all errors are caught or corrected. If there are questions or concerns about the content of this note or information contained within the body of this dictation they should be addressed directly with the author for clarification     Tenny Craw, MD  02/04/19 [phone]

## 2019-02-03 NOTE — Discharge Instructions (Signed)
Musculoskeletal Chest Pain    You have been diagnosed with musculoskeletal chest pain.    Your pain is due to an injury or inflammation (swelling) of the muscles, ligaments, cartilage (soft bone), or bone in your chest. The pain is usually sharp and knife-like and becomes worse with twisting, bending, or moving. It commonly occurs in a small area, and can be irritated by pressing on it. There is usually no shortness of breath, lightheadedness, weakness, or sweaty feeling. Some children will have pain when taking a deep breath or when coughing. Exercise usually does not affect these symptoms.    Musculoskeletal chest pain is treated with anti-inflammatory medications like ibuprofen (Advil or Motrin) or naproxen (Aleve). Other pain medications are usually not needed. Depending on the reason for your symptoms, either warm or cool compresses (damp washcloths laid on the skin) may be helpful.    Most musculoskeletal chest pain improves over several days.    YOU SHOULD SEEK MEDICAL ATTENTION IMMEDIATELY, EITHER HERE OR AT THE NEAREST EMERGENCY DEPARTMENT, IF ANY OF THE FOLLOWING OCCURS:   Your pain gets worse.   Your pain makes you feel short of breath, nauseated, or sweaty.   You notice that your pain gets worse as you walk, go up stairs, or exert yourself.   You have any weakness or lightheadedness with your pain.   Your pain makes breathing difficult.   You develop a swollen leg.   Your symptoms get worse or you have other concerns.     If you can't follow up with your doctor, or if at any time you feel you need to be rechecked or seen again, come back here or go to the nearest emergency department.

## 2019-02-09 LAB — ECG 12-LEAD
Atrial Rate: 85 {beats}/min
P Axis: 57 degrees
P-R Interval: 154 ms
Q-T Interval: 354 ms
QRS Duration: 84 ms
QTC Calculation (Bezet): 421 ms
R Axis: -45 degrees
T Axis: 37 degrees
Ventricular Rate: 85 {beats}/min

## 2019-02-11 ENCOUNTER — Emergency Department: Payer: Self-pay

## 2019-02-11 ENCOUNTER — Emergency Department
Admission: EM | Admit: 2019-02-11 | Discharge: 2019-02-11 | Disposition: A | Discharge status: Home / Self Care | Payer: Self-pay | Attending: Emergency Medicine | Admitting: Emergency Medicine

## 2019-02-11 DIAGNOSIS — Z8249 Family history of ischemic heart disease and other diseases of the circulatory system: Secondary | ICD-10-CM | POA: Insufficient documentation

## 2019-02-11 DIAGNOSIS — Z765 Malingerer [conscious simulation]: Secondary | ICD-10-CM

## 2019-02-11 DIAGNOSIS — R079 Chest pain, unspecified: Secondary | ICD-10-CM

## 2019-02-11 DIAGNOSIS — R0789 Other chest pain: Secondary | ICD-10-CM

## 2019-02-11 DIAGNOSIS — G8929 Other chronic pain: Secondary | ICD-10-CM

## 2019-02-11 DIAGNOSIS — F17210 Nicotine dependence, cigarettes, uncomplicated: Secondary | ICD-10-CM | POA: Insufficient documentation

## 2019-02-11 LAB — GFR: EGFR: >60

## 2019-02-11 LAB — ECG 12-LEAD
Atrial Rate: 80 {beats}/min
P Axis: 58 degrees
P-R Interval: 156 ms
Q-T Interval: 380 ms
QRS Duration: 86 ms
QTC Calculation (Bezet): 438 ms
R Axis: -38 degrees
T Axis: 27 degrees
Ventricular Rate: 80 {beats}/min

## 2019-02-11 LAB — CELL MORPHOLOGY
Cell Morphology: NORMAL
Platelet Estimate: NORMAL

## 2019-02-11 LAB — CBC AND DIFFERENTIAL
Absolute NRBC: 0 10*3/uL (ref 0.00–0.00)
Hematocrit: 39.6 % (ref 37.6–49.6)
Hgb: 13.1 g/dL (ref 12.5–17.1)
MCH: 30.3 pg (ref 25.1–33.5)
MCHC: 33.1 g/dL (ref 31.5–35.8)
MCV: 91.7 fL (ref 78.0–96.0)
MPV: 10.6 fL (ref 8.9–12.5)
Nucleated RBC: 0 /100 WBC (ref 0.0–0.0)
Platelets: 206 10*3/uL (ref 142–346)
RBC: 4.32 10*6/uL (ref 4.20–5.90)
RDW: 13 % (ref 11–15)
WBC: 7.97 10*3/uL (ref 3.10–9.50)

## 2019-02-11 LAB — COMPREHENSIVE METABOLIC PANEL
ALT: 8 U/L (ref 0–55)
AST (SGOT): 16 U/L (ref 5–34)
Albumin/Globulin Ratio: 1.4 (ref 0.9–2.2)
Albumin: 4.1 g/dL (ref 3.5–5.0)
Alkaline Phosphatase: 78 U/L (ref 38–106)
Anion Gap: 10 (ref 5.0–15.0)
BUN: 9 mg/dL (ref 9.0–28.0)
Bilirubin, Total: 1.1 mg/dL (ref 0.2–1.2)
CO2: 24 mEq/L (ref 22–29)
Calcium: 9.2 mg/dL (ref 8.5–10.5)
Chloride: 105 mEq/L (ref 100–111)
Creatinine: 0.8 mg/dL (ref 0.7–1.3)
Globulin: 3 g/dL (ref 2.0–3.6)
Glucose: 108 mg/dL — ABNORMAL HIGH (ref 70–100)
Potassium: 4.1 mEq/L (ref 3.5–5.1)
Protein, Total: 7.1 g/dL (ref 6.0–8.3)
Sodium: 139 mEq/L (ref 136–145)

## 2019-02-11 LAB — MAN DIFF ONLY
Atypical Lymphocytes %: 6 %
Atypical Lymphocytes Absolute: 0.48 10*3/uL — ABNORMAL HIGH (ref 0.00–0.00)
Band Neutrophils Absolute: 0 10*3/uL (ref 0.00–1.00)
Band Neutrophils: 0 %
Basophils Absolute Manual: 0 10*3/uL (ref 0.00–0.08)
Basophils Manual: 0 %
Eosinophils Absolute Manual: 0.64 10*3/uL — ABNORMAL HIGH (ref 0.00–0.44)
Eosinophils Manual: 8 %
Lymphocytes Absolute Manual: 2.55 10*3/uL (ref 0.42–3.22)
Lymphocytes Manual: 32 %
Monocytes Absolute: 0.96 10*3/uL — ABNORMAL HIGH (ref 0.21–0.85)
Monocytes Manual: 12 %
Neutrophils Absolute Manual: 3.35 10*3/uL (ref 1.10–6.33)
Segmented Neutrophils: 42 %

## 2019-02-11 LAB — ETHANOL: Alcohol: NOT DETECTED mg/dL

## 2019-02-11 LAB — TROPONIN I: Troponin I: <0.01 ng/mL (ref 0.00–0.05)

## 2019-02-11 LAB — HEMOLYSIS INDEX: Hemolysis Index: 19 — ABNORMAL HIGH (ref 0–18)

## 2019-02-11 LAB — LIPASE: Lipase: 24 U/L (ref 8–78)

## 2019-02-11 MED ORDER — SODIUM CHLORIDE 0.9 % IV BOLUS
1000.00 mL | Freq: Once | INTRAVENOUS | Status: AC
Start: 2019-02-11 — End: 2019-02-11
  Administered 2019-02-11: 10:00:00 1000 mL via INTRAVENOUS

## 2019-02-11 MED ORDER — LORAZEPAM 2 MG/ML IJ SOLN
1.00 mg | Freq: Once | INTRAMUSCULAR | Status: DC
Start: 2019-02-11 — End: 2019-02-11

## 2019-02-11 MED ORDER — IOHEXOL 350 MG/ML IV SOLN
100.00 mL | Freq: Once | INTRAVENOUS | Status: AC | PRN
Start: 2019-02-11 — End: 2019-02-11
  Administered 2019-02-11: 10:00:00 100 mL via INTRAVENOUS

## 2019-02-11 NOTE — Discharge Instructions (Signed)
Musculoskeletal Chest Pain    You have been diagnosed with musculoskeletal chest pain.    Your pain is due to an injury or inflammation (swelling) of the muscles, ligaments, cartilage (soft bone), or bone in your chest. The pain is usually sharp and knife-like and becomes worse with twisting, bending, or moving. It commonly occurs in a small area, and can be irritated by pressing on it. There is usually no shortness of breath, lightheadedness, weakness, or sweaty feeling. Some children will have pain when taking a deep breath or when coughing. Exercise usually does not affect these symptoms.    Musculoskeletal chest pain is treated with anti-inflammatory medications like ibuprofen (Advil or Motrin) or naproxen (Aleve). Other pain medications are usually not needed. Depending on the reason for your symptoms, either warm or cool compresses (damp washcloths laid on the skin) may be helpful.    Most musculoskeletal chest pain improves over several days.    YOU SHOULD SEEK MEDICAL ATTENTION IMMEDIATELY, EITHER HERE OR AT THE NEAREST EMERGENCY DEPARTMENT, IF ANY OF THE FOLLOWING OCCURS:   Your pain gets worse.   Your pain makes you feel short of breath, nauseated, or sweaty.   You notice that your pain gets worse as you walk, go up stairs, or exert yourself.   You have any weakness or lightheadedness with your pain.   Your pain makes breathing difficult.   You develop a swollen leg.   Your symptoms get worse or you have other concerns.     If you can't follow up with your doctor, or if at any time you feel you need to be rechecked or seen again, come back here or go to the nearest emergency department.

## 2019-02-11 NOTE — ED Provider Notes (Signed)
EMERGENCY DEPARTMENT HISTORY AND PHYSICAL EXAM    Date: 02/11/2019  Patient Name: [name] [name]  Attending Physician:  Lavonda Jumbo, MD, FACEP [facility]  Diagnosis and Treatment Plan       Clinical Impression:   Final diagnoses:   Chest wall pain, chronic   Other chronic pain   Malingering       Treatment Plan:   ED Disposition     ED Disposition Condition Date/Time Comment    Discharge  Thu Feb 12, 2019 10:19 AM           History of Presenting Illness     Chief Complaint   Patient presents with    Chest Pain       History Provided By: [name] [name]  Chief Complaint: cp, abd pain  Onset: 1 week  Timing: acute   Location: cardiovascular, abd   Quality: painful   Severity: moderate   Modifying Factors: many recent visits for same sx, drug related    Associated sxs: none         Additional History: [name] [name] is a 53 y.o. male w/ hx of GI issues and dvt, w/ many recent visits to the ED for same issue each related to either cocaine use, heroin use, and alcohol use for another episode of CP and abd pain starting today. [name] [name] states he thinks he has had clots in his lungs. [name] [name] denies ever presenting to the ED or ever using any drugs or alcohol.     PCP: Pcp, None, MD      No current facility-administered medications for this encounter.   No current outpatient medications on file.    ALL Past Medical History, Surgical History, Family History, and Social History available at  Physician/Midlevel provider first contact with patient: 02/11/19 0900      were reviewed at this time.    Past Medical History     Past Medical History:   Diagnosis Date    Depression     Gallstones     Gastritis     Heart murmur     Renal calculus      History reviewed. No pertinent surgical history.    Family History     Family History   Problem Relation Age of Onset    Diabetes Mother     Hypertension Mother        Social History     Social History     Socioeconomic History    Marital status: Single     Spouse name: Not on file    Number of children:  Not on file    Years of education: Not on file    Highest education level: Not on file   Occupational History    Not on file   Social Needs    Financial resource strain: Not on file    Food insecurity     Worry: Not on file     Inability: Not on file    Transportation needs     Medical: Not on file     Non-medical: Not on file   Tobacco Use    Smoking status: Current Every Day Smoker     Packs/day: 1.00     Years: 0.50     Pack years: 0.50     Types: Cigarettes    Smokeless tobacco: Never Used   Substance and Sexual Activity    Alcohol use: Not Currently    Drug use: Yes     Types: Marijuana, Cocaine  Comment: heroin     Sexual activity: Not on file   Lifestyle    Physical activity     Days per week: Not on file     Minutes per session: Not on file    Stress: Not on file   Relationships    Social connections     Talks on phone: Not on file     Gets together: Not on file     Attends religious service: Not on file     Active member of club or organization: Not on file     Attends meetings of clubs or organizations: Not on file     Relationship status: Not on file    Intimate partner violence     Fear of current or ex partner: Not on file     Emotionally abused: Not on file     Physically abused: Not on file     Forced sexual activity: Not on file   Other Topics Concern    Not on file   Social History Narrative    Not on file       Allergies     Allergies   Allergen Reactions    Gluten      [name] [name] states "white bread"    Percocet [Oxycodone-Acetaminophen]     Tylenol [Acetaminophen] Hives     Tyelnol#3, hives and throw up       Review of Systems         Review of Systems   Constitutional: Negative for chills and fever.  Cardiovascular: Positive for CP.   HENT: Negative for hearing loss and tinnitus.    Eyes: Negative for blurred vision and double vision.   Gastrointestinal: Positive for abd pain. Negative for blood in stool and heartburn.  Genitourinary: Negative for dysuria and urgency.    Neurological: Negative for sensory change and speech change.  Psychiatric: Positive for substance abuse.   All other systems reviewed and are negative.        Physical Exam     BP 134/84    Pulse (!) 55    Temp 98.5 F (36.9 C) (Oral)    Resp 20    SpO2 100%   Pulse Oximetry Analysis - Normal         Physical Examination: General appearance - alert, well appearing, and in no distress  Mental status - alert, oriented to person, place, and time  Eyes - pupils equal and reactive, extraocular eye movements intact  Nose - normal and patent, no discharge  Neck - grossly normal rom  Chest - clear to auscultation, no wheezes, rales or rhonchi, symmetric air entry  Heart - normal rate, regular rhythm  Abdomen - soft, nontender, nondistended  Neurological - maex4, speech clear  NIHSS-0, grossly normal swallow study as patient handles secretions well  Musculoskeletal - no joint tenderness, deformity or swelling  Extremities - distal blood flow grossly normal, no pedal edema  Skin - normal coloration, no rashes where visualized   Psychiatric- alert, oriented, anxious        EKG: Interpreted by EDP. Sinus rhythm. Rate is normal (80). Nonspecific ST/Twave changes. Monitor: Normal sinus rhythm.          Diagnostic Study Results     Labs -     Results     Procedure Component Value Units Date/Time    Manual Differential [[id number]]  (Abnormal) Collected: 02/11/19 0923     Updated: 02/11/19 1001     Segmented Neutrophils 42 %  Band Neutrophils 0 %      Lymphocytes Manual 32 %      Monocytes Manual 12 %      Eosinophils Manual 8 %      Basophils Manual 0 %      Atypical Lymphocytes % 6 %      Neutrophils Absolute Manual 3.35 x10 3/uL      Band Neutrophils Absolute 0.00 x10 3/uL      Lymphocytes Absolute Manual 2.55 x10 3/uL      Monocytes Absolute 0.96 x10 3/uL      Eosinophils Absolute Manual 0.64 x10 3/uL      Basophils Absolute Manual 0.00 x10 3/uL      Atypical Lymphocytes Absolute 0.48 x10 3/uL     Cell MorpHology  [[id number]] Collected: 02/11/19 0923     Updated: 02/11/19 1001     Cell Morphology Normal     Platelet Estimate Normal    CBC and differential [[id number]] Collected: 02/11/19 0923    Specimen: Blood Updated: 02/11/19 1001     WBC 7.97 x10 3/uL      Hgb 13.1 g/dL      Hematocrit 84.6 %      Platelets 206 x10 3/uL      RBC 4.32 x10 6/uL      MCV 91.7 fL      MCH 30.3 pg      MCHC 33.1 g/dL      RDW 13 %      MPV 10.6 fL      Nucleated RBC 0.0 /100 WBC      Absolute NRBC 0.00 x10 3/uL     GFR [[id number]] Collected: 02/11/19 0923     Updated: 02/11/19 0955     EGFR >60.0    Lipase [[id number]] Collected: 02/11/19 0923    Specimen: Blood Updated: 02/11/19 0955     Lipase 24 U/L     Troponin I [[id number]] Collected: 02/11/19 0923    Specimen: Blood Updated: 02/11/19 0955     Troponin I <0.01 ng/mL     Hemolysis index [[id number]]  (Abnormal) Collected: 02/11/19 0923     Updated: 02/11/19 0955     Hemolysis Index 19    Comprehensive metabolic panel [[id number]]  (Abnormal) Collected: 02/11/19 0923    Specimen: Blood Updated: 02/11/19 0955     Glucose 108 mg/dL      BUN 9.0 mg/dL      Creatinine 0.8 mg/dL      Sodium 518 mEq/L      Potassium 4.1 mEq/L      Chloride 105 mEq/L      CO2 24 mEq/L      Calcium 9.2 mg/dL      Protein, Total 7.1 g/dL      Albumin 4.1 g/dL      AST (SGOT) 16 U/L      ALT 8 U/L      Alkaline Phosphatase 78 U/L      Bilirubin, Total 1.1 mg/dL      Globulin 3.0 g/dL      Albumin/Globulin Ratio 1.4     Anion Gap 10.0    Ethanol (Alcohol) Level [[id number]] Collected: 02/11/19 0923    Specimen: Blood Updated: 02/11/19 0955     Alcohol None Detected mg/dL           Radiologic Studies -   Radiology Results (24 Hour)     Procedure Component Value Units Date/Time    CT Angiogram Chest [[id number]] Collected: 02/11/19 1026  Order Status: Completed Updated: 02/11/19 1044    Narrative:      CT ANGIOGRAM CHEST    CLINICAL INDICATION:   CHEST PAIN, ACUTE, PE SUSPECTED    COMPARISON: None    TECHNIQUE: 2.5 mm axial  images through the thorax following intravenous  contrast administration with sagittal and coronal reformatted/3-D MIP  images. Approximately 87 cc of Omnipaque 350 was administered  intravenously. The following ?dose reduction techniques were utilized:  automated exposure control and/or adjustment of the mA and/or kV  according to patient size, and the use of iterative reconstruction  technique.      FINDINGS:      The pulmonary arterial tree appears adequately opacified to the  segmental level. There is no evidence for pulmonary embolus in the  adequately opacified portions of the pulmonary arterial tree.    There is no significant pericardial effusion. There is atherosclerotic  coronary artery calcification. There are subcentimeter mediastinal and  hilar lymph nodes. There is no focal airspace consolidation, pleural  effusion, or pneumothorax. The lungs demonstrate mild changes of  emphysema with areas of atelectasis and scarring. The central airways  appear patent.    The imaged portions of the upper abdomen appear grossly unremarkable for  technique. Bone windows demonstrate degenerative changes in the spine.            Impression:             1. No CT evidence for pulmonary embolus.    2. Incidental findings as described above.    Sandie Ano, MD   02/11/2019 10:42 AM    XR Chest AP Portable [[id number]] Collected: 02/11/19 0946    Order Status: Completed Updated: 02/11/19 0948    Narrative:      INDICATION: diffuse chest pain    TECHNIQUE: Portable AP radiograph of the chest.    COMPARISON: Chest radiograph dated 02/03/2019.    FINDINGS: Cardiomediastinal silhouette is normal in size and  appearance.  Lungs are clear. Pleural spaces are clear. No pneumothorax.   Upper abdomen and regional osseous structures demonstrate no acute  abnormality.      Impression:          No acute thoracic process.     Aldean Ast, MD   02/11/2019 9:46 AM      .    Melrose Nakayama Notes     11:25 AM - [name] [name] refused urinalysis. I confronted him  about his drug use.  11:31 AM -  [name] [name] swearing at me. [name] [name] denies any drug use ever or ever being to the ED despite multiple recent visits. States that he wishes to be admitted and receive pain medications. He is angry that he has not been given any pain meds and states that he was not treated right in the ED. [name] [name] refuses to leave.    The patient verbalizes understanding of the test results, short and long term treatment plan, indications to return to emergency department, need for immediate follow up, and possible medication side effects. The patient has decided to be discharged home at this time. The patient does not have a preferred pharmacy at this time and will be given paper prescriptions unless otherwise noted.         Attestations:     Physician/Midlevel provider first contact with patient: 02/11/19 0900         This note is prepared for Lavonda Jumbo, MD, FACEP. The scribe's documentation has been prepared under my direction and personally reviewed by me in  its entirety.  I confirm that the note above accurately reflects all work, treatment, procedures, and medical decision making performed by me.     I am the first provider for this patient.      Lavonda Jumbo, MD, FACEP is the primary emergency doctor of record.      I reviewed the available vital signs, nursing notes, past medical history, past surgical history, family history and social history at the time of initial evaluation.    _______________________________           Donny Pique, MD  02/12/19 1020

## 2019-02-11 NOTE — ED Notes (Signed)
Pt is texting on his cellular phone so he was asked to keep his left arm straight so his fluids will run in.

## 2019-02-11 NOTE — ED Notes (Signed)
Bed: GR10  Expected date:   Expected time:   Means of arrival:   Comments:

## 2019-02-11 NOTE — ED Triage Notes (Signed)
[name] [name] comes to ED for Constant chest pressure since last night .  PT reports pain is constant and does not change with movement.     Visit Vitals  BP 127/80   Pulse 84   Temp 98.4 F (36.9 C) (Oral)   SpO2 98%

## 2019-03-09 LAB — HEPATITIS C ANTIBODY: Hepatitis C, AB: NONREACTIVE

## 2019-03-09 LAB — HEPATITIS B SURFACE ANTIGEN W/ REFLEX TO CONFIRMATION: Hepatitis B Surface Antigen: NONREACTIVE

## 2019-03-09 LAB — HEPATITIS B CORE ANTIBODY, IGM: Hepatitis B Core IgM: NONREACTIVE

## 2019-03-09 LAB — SYPHILIS SCREEN IGG AND IGM: Syphilis Screen IgG and IgM: REACTIVE — AB

## 2019-03-09 LAB — HIV-1/2 AG/AB 4TH GEN. W/ REFLEX: HIV Ag/Ab, 4th Generation: NONREACTIVE

## 2019-03-09 LAB — HEMOLYSIS INDEX: Hemolysis Index: 11 (ref 0–18)

## 2019-03-10 LAB — RPR (REFLEX TO TITER AND CONFIRMATION): RPR: REACTIVE — AB

## 2019-03-10 LAB — RPR QUANTITATIVE: RPR Quantitative: 1:2 {titer} — AB

## 2019-03-27 ENCOUNTER — Emergency Department: Payer: Medicaid Other

## 2019-03-27 ENCOUNTER — Emergency Department
Admission: EM | Admit: 2019-03-27 | Discharge: 2019-03-28 | Disposition: A | Discharge status: Home / Self Care | Payer: Medicaid Other | Attending: Emergency Medicine | Admitting: Emergency Medicine

## 2019-03-27 DIAGNOSIS — Z886 Allergy status to analgesic agent status: Secondary | ICD-10-CM | POA: Insufficient documentation

## 2019-03-27 DIAGNOSIS — K219 Gastro-esophageal reflux disease without esophagitis: Secondary | ICD-10-CM | POA: Insufficient documentation

## 2019-03-27 DIAGNOSIS — R079 Chest pain, unspecified: Secondary | ICD-10-CM | POA: Insufficient documentation

## 2019-03-27 DIAGNOSIS — F17210 Nicotine dependence, cigarettes, uncomplicated: Secondary | ICD-10-CM | POA: Insufficient documentation

## 2019-03-27 DIAGNOSIS — Z885 Allergy status to narcotic agent status: Secondary | ICD-10-CM | POA: Insufficient documentation

## 2019-03-27 LAB — COMPREHENSIVE METABOLIC PANEL
ALT: 17 U/L (ref 0–55)
AST (SGOT): 21 U/L (ref 5–34)
Albumin/Globulin Ratio: 1.3 (ref 0.9–2.2)
Albumin: 4.2 g/dL (ref 3.5–5.0)
Alkaline Phosphatase: 76 U/L (ref 38–106)
Anion Gap: 9 (ref 5.0–15.0)
BUN: 10 mg/dL (ref 9.0–28.0)
Bilirubin, Total: 0.6 mg/dL (ref 0.2–1.2)
CO2: 26 mEq/L (ref 22–29)
Calcium: 9.1 mg/dL (ref 8.5–10.5)
Chloride: 101 mEq/L (ref 100–111)
Creatinine: 0.8 mg/dL (ref 0.7–1.3)
Globulin: 3.2 g/dL (ref 2.0–3.6)
Glucose: 122 mg/dL — ABNORMAL HIGH (ref 70–100)
Potassium: 4.1 mEq/L (ref 3.5–5.1)
Protein, Total: 7.4 g/dL (ref 6.0–8.3)
Sodium: 136 mEq/L (ref 136–145)

## 2019-03-27 LAB — MAN DIFF ONLY
Band Neutrophils Absolute: 0 10*3/uL (ref 0.00–1.00)
Band Neutrophils: 0 %
Basophils Absolute Manual: 0 10*3/uL (ref 0.00–0.08)
Basophils Manual: 0 %
Eosinophils Absolute Manual: 0 10*3/uL (ref 0.00–0.44)
Eosinophils Manual: 0 %
Lymphocytes Absolute Manual: 1.1 10*3/uL (ref 0.42–3.22)
Lymphocytes Manual: 12 %
Monocytes Absolute: 0.92 10*3/uL — ABNORMAL HIGH (ref 0.21–0.85)
Monocytes Manual: 10 %
Neutrophils Absolute Manual: 7.16 10*3/uL — ABNORMAL HIGH (ref 1.10–6.33)
Segmented Neutrophils: 78 %

## 2019-03-27 LAB — TROPONIN I: Troponin I: <0.01 ng/mL (ref 0.00–0.05)

## 2019-03-27 LAB — CBC AND DIFFERENTIAL
Absolute NRBC: 0 10*3/uL (ref 0.00–0.00)
Hematocrit: 36.8 % — ABNORMAL LOW (ref 37.6–49.6)
Hgb: 12.3 g/dL — ABNORMAL LOW (ref 12.5–17.1)
MCH: 30.2 pg (ref 25.1–33.5)
MCHC: 33.4 g/dL (ref 31.5–35.8)
MCV: 90.4 fL (ref 78.0–96.0)
MPV: 10.6 fL (ref 8.9–12.5)
Nucleated RBC: 0 /100 WBC (ref 0.0–0.0)
Platelets: 183 10*3/uL (ref 142–346)
RBC: 4.07 10*6/uL — ABNORMAL LOW (ref 4.20–5.90)
RDW: 12 % (ref 11–15)
WBC: 9.18 10*3/uL (ref 3.10–9.50)

## 2019-03-27 LAB — CELL MORPHOLOGY
Cell Morphology: NORMAL
Platelet Estimate: NORMAL

## 2019-03-27 LAB — GFR: EGFR: >60

## 2019-03-27 LAB — HEMOLYSIS INDEX: Hemolysis Index: 3 (ref 0–18)

## 2019-03-27 LAB — B-TYPE NATRIURETIC PEPTIDE: B-Natriuretic Peptide: <10 pg/mL (ref 0–100)

## 2019-03-27 MED ORDER — KETOROLAC TROMETHAMINE 30 MG/ML IJ SOLN
30.00 mg | Freq: Once | INTRAMUSCULAR | Status: DC
Start: 2019-03-27 — End: 2019-03-28

## 2019-03-27 MED ORDER — LIDOCAINE VISCOUS HCL 2 % MT SOLN
10.00 mL | Freq: Once | OROMUCOSAL | Status: AC
Start: 2019-03-27 — End: 2019-03-27
  Administered 2019-03-27: 23:00:00 10 mL via OROMUCOSAL
  Filled 2019-03-27: qty 15

## 2019-03-27 MED ORDER — ALUM & MAG HYDROXIDE-SIMETH 200-200-20 MG/5ML PO SUSP
30.00 mL | Freq: Once | ORAL | Status: AC
Start: 2019-03-27 — End: 2019-03-27
  Administered 2019-03-27: 23:00:00 30 mL via ORAL
  Filled 2019-03-27: qty 30

## 2019-03-27 MED ORDER — ASPIRIN 325 MG PO TABS
325.0000 mg | ORAL_TABLET | Freq: Once | ORAL | Status: DC
Start: 2019-03-27 — End: 2019-03-28

## 2019-03-27 MED ORDER — NITROGLYCERIN 0.4 MG SL SUBL
0.4000 mg | SUBLINGUAL_TABLET | SUBLINGUAL | Status: DC
Start: 2019-03-27 — End: 2019-03-27
  Filled 2019-03-27: qty 1

## 2019-03-27 NOTE — ED Triage Notes (Signed)
Pt. BIBA for midsternal CP radiating to L side x few hours. Per pt. He has a "cardiac hx." No meds given PTA. Pt denies N/V/D. Hx blood clots with pain in L leg.

## 2019-03-27 NOTE — ED Provider Notes (Signed)
EMERGENCY DEPARTMENT HISTORY AND PHYSICAL EXAM     None        Date: 03/27/2019  Patient Name: [name] [name]  Attending Physician: Darcus Pester, MD  Advanced Practice Provider: Lynita Lombard      History of Presenting Illness     History Provided By: Patient and EMS    Chief Complaint:  Chief Complaint   Patient presents with    Chest Pain     Onset: Acute prior to arrival  Timing: Acute  Location: Chest  Quality: Pain radiating to left chest  Severity: Moderate  Exacerbating factors: Exertion palpation  Alleviating factors: None  Associated Symptoms: see ROS  Pertinent Negatives: see ROS    Additional History: [name] [name] is a 54 y.o. male presenting to the ED with history of GERD heroin and cocaine use presents with chest pain.  Patient also has a history of "blood clot "currently not on anticoagulation.   He is not sure what kind of clot it was.  He called 911 because he is having chest pain.  No nausea vomiting fevers or chills.  Patient was supposed to follow-up with cardiology when he was here last month however did not  Patient has a history of substance use however he states that he has not use recently.  He denies drinking      PCP: Pcp, None, MD  Cardiologist :         Current Facility-Administered Medications:     acetaminophen (TYLENOL) tablet 650 mg, 650 mg, Oral, Once, Lynita Lombard, PA    aspirin tablet 325 mg, 325 mg, Oral, Once, Lynita Lombard, PA    ketorolac (TORADOL) injection 30 mg, 30 mg, Intravenous, Once, Lynita Lombard, [place], Stopped at 03/27/19 2331  No current outpatient medications on file.    Past Medical History     Past Medical History:   Diagnosis Date    Depression     Gallstones     Gastritis     Heart murmur     Renal calculus      History reviewed. No pertinent surgical history.    Family History     Family History   Problem Relation Age of Onset    Diabetes Mother     Hypertension Mother        Social History     Social History     Socioeconomic  History    Marital status: Single     Spouse name: Not on file    Number of children: Not on file    Years of education: Not on file    Highest education level: Not on file   Occupational History    Not on file   Social Needs    Financial resource strain: Not on file    Food insecurity     Worry: Not on file     Inability: Not on file    Transportation needs     Medical: Not on file     Non-medical: Not on file   Tobacco Use    Smoking status: Current Every Day Smoker     Packs/day: 1.00     Years: 0.50     Pack years: 0.50     Types: Cigarettes    Smokeless tobacco: Never Used   Substance and Sexual Activity    Alcohol use: Not Currently    Drug use: Yes     Types: Marijuana, Cocaine     Comment:  heroin     Sexual activity: Not on file   Lifestyle    Physical activity     Days per week: Not on file     Minutes per session: Not on file    Stress: Not on file   Relationships    Social connections     Talks on phone: Not on file     Gets together: Not on file     Attends religious service: Not on file     Active member of club or organization: Not on file     Attends meetings of clubs or organizations: Not on file     Relationship status: Not on file    Intimate partner violence     Fear of current or ex partner: Not on file     Emotionally abused: Not on file     Physically abused: Not on file     Forced sexual activity: Not on file   Other Topics Concern    Not on file   Social History Narrative    Not on file        Allergies     Allergies   Allergen Reactions    Gluten      Pt states "white bread"    Percocet [Oxycodone-Acetaminophen]     Tylenol [Acetaminophen] Hives     Tyelnol#3, hives and throw up       Review of Systems     General: No fever, no sweats, no chills.   HENT: No headache, no neck pain, no cold symptoms.  Respiratory: No cough, no shortness of breath, no hemoptysis, no wheezing.  Cardiovascular: + chest pain, no calf pain, no leg swelling.   Gastrointestinal: No abdominal  pain, no nausea, no vomiting, no diarrhea.   Genito-Urinary: No dysuria, no hematuria  Musculoskeletal: No back pain, no neck pain.    Neurological: No new focal weakness, no sensory changes.   Dermatological: No new rashes, no color changes.   Psychological: No acute mood changes, no confusion.     Physical Exam     BP 143/82    Pulse 78    Temp 98 F (36.7 C) (Oral)    Resp 20    Ht 5\' 8"  (1.727 m)    Wt 89 kg    SpO2 98%    BMI 29.83 kg/m     Constitutional: Vital signs reviewed. Well appearing, no apparent distress.  Head: Normocephalic, atraumatic. No external trauma noted.  Eyes: Conjunctiva and sclera are normal. Pupils equal, round, reactive.  Ear, Nose, Throat:  Normal external examination of the nose and ears. Oropharynx clear, moist mucous membranes. No tonsillar swelling or exudates.   Neck: Supple. Trachea midline.  No cervical lymphadenopathy. Respiratory: Clear to auscultation. No respiratory distress. No wheezing, rhonchi or rales.  Cardiovascular: Regular rate. Regular rhythm. S1, S2.  No murmur.  Chest: No chest wall tenderness or crepitus.   Abdomen: Normoactive Bowel sounds. Soft. Non-tender to palpation. No guarding or rebound.  Back: No midline tenderness, no CVA tenderness.   Extremities: Distal pulses intact bilaterally positive TTP left calf      Skin: Warm and dry. No rash.  Neuro: alert and appropriate, normal speech, no facial droop, moving all extremities.     Diagnostic Study Results     Labs -     Results     Procedure Component Value Units Date/Time    Troponin I [[id number]] Collected: 03/28/19 0204    Specimen: Blood Updated: 03/28/19  0239     Troponin I <0.01 ng/mL     D-Dimer [[id number]] Collected: 03/27/19 2300     Updated: 03/28/19 0012     D-Dimer 0.29 ug/mL FEU     B-type Natriuretic Peptide [[id number]] Collected: 03/27/19 2300    Specimen: Blood Updated: 03/27/19 2347     B-Natriuretic Peptide <10 pg/mL     Cell MorpHology [[id number]] Collected: 03/27/19 2300     Updated:  03/27/19 2336     Cell Morphology Normal     Platelet Estimate Normal    Manual Differential [[id number]]  (Abnormal) Collected: 03/27/19 2300     Updated: 03/27/19 2336     Segmented Neutrophils 78 %      Band Neutrophils 0 %      Lymphocytes Manual 12 %      Monocytes Manual 10 %      Eosinophils Manual 0 %      Basophils Manual 0 %      Neutrophils Absolute Manual 7.16 x10 3/uL      Band Neutrophils Absolute 0.00 x10 3/uL      Lymphocytes Absolute Manual 1.10 x10 3/uL      Monocytes Absolute 0.92 x10 3/uL      Eosinophils Absolute Manual 0.00 x10 3/uL      Basophils Absolute Manual 0.00 x10 3/uL     CBC and differential [[id number]]  (Abnormal) Collected: 03/27/19 2300    Specimen: Blood Updated: 03/27/19 2336     WBC 9.18 x10 3/uL      Hgb 12.3 g/dL      Hematocrit 24.4 %      Platelets 183 x10 3/uL      RBC 4.07 x10 6/uL      MCV 90.4 fL      MCH 30.2 pg      MCHC 33.4 g/dL      RDW 12 %      MPV 10.6 fL      Nucleated RBC 0.0 /100 WBC      Absolute NRBC 0.00 x10 3/uL     GFR [[id number]] Collected: 03/27/19 2300     Updated: 03/27/19 2329     EGFR >60.0    Comprehensive metabolic panel [[id number]]  (Abnormal) Collected: 03/27/19 2300    Specimen: Blood Updated: 03/27/19 2329     Glucose 122 mg/dL      BUN 59.5 mg/dL      Creatinine 0.8 mg/dL      Sodium 638 mEq/L      Potassium 4.1 mEq/L      Chloride 101 mEq/L      CO2 26 mEq/L      Calcium 9.1 mg/dL      Protein, Total 7.4 g/dL      Albumin 4.2 g/dL      AST (SGOT) 21 U/L      ALT 17 U/L      Alkaline Phosphatase 76 U/L      Bilirubin, Total 0.6 mg/dL      Globulin 3.2 g/dL      Albumin/Globulin Ratio 1.3     Anion Gap 9.0    Troponin I [[id number]] Collected: 03/27/19 2300    Specimen: Blood Updated: 03/27/19 2329     Troponin I <0.01 ng/mL     Hemolysis index [[id number]] Collected: 03/27/19 2300     Updated: 03/27/19 2329     Hemolysis Index 3          Radiologic Studies -   Radiology Results (24 Hour)  Procedure Component Value Units Date/Time    Chest AP  Portable [[id number]] Collected: 03/27/19 2328    Order Status: Completed Updated: 03/27/19 2332    Narrative:      CLINICAL HISTORY:  Chest Pain    EXAMINATION:  XR CHEST AP PORTABLE    COMPARISON:  02/11/2019      FINDINGS:    Lungs: clear      Pleural space: Within normal limits.    Mediastinum: Within normal limits.    Bones: No acute abnormality.    Soft tissues: Within normal limits.        Impression:        No acute findings or substantial change.    Nicki Reaper, MD   03/27/2019 11:30 PM      .    Medical Decision Making   I am the first provider for this patient.    I reviewed the vital signs, available nursing notes, past medical history, past surgical history, family history and social history.    Vital Signs-Reviewed the patient's vital signs.     Patient Vitals for the past 12 hrs:   BP Temp Pulse Resp   03/28/19 0201 143/82 98 F (36.7 C) 78 20   03/28/19 0111 (!) 143/101 -- 82 (!) 31   03/28/19 0000 113/64 -- 74 14   03/27/19 2257 129/82 99 F (37.2 C) 97 14         EKG:  Interpreted by myself  Normal sinus rhythm left axis deviation no acute ischemic changes compared with prior    EKG 2 normal sinus rhythm left axis deviation no acute ischemic changes  Compared with prior    EKG 3 normal sinus rhythm left axis deviation no changes  Compared with prior    Pulse Oximetry Interpretation: Normal 98% on ra      ED Course:     ED Course as of Mar 27 522   Sat Mar 28, 2019   6962 Patient updated on labs he was sleeping.  When he woke up he complained of chest pain will remedicate.  Unable to give nitro because he took Viagra prior to arrival    [AD]   0105 Patient had an episode of recurrent chest pain at rest will give Flexeril since he refuses NSAIDs repeat EKG    [AD]   0326 Patient still complaining of recurrent chest pain.  Explained that patient is stable for discharge all results reviewed including negative D-dimer do not suspect DVT or PE 3 EKGs and 2 troponins were both negative.  Patient had  crackers and juice.  Patient remained stable for discharge I do not suspect cardiac etiology.  Possible malingering    [AD]      ED Course User Index  [AD] Lynita Lombard, PA           Provider Notes/Medical Decision Making:   Patient is a 54 year old male with history of smoking heroin use cocaine use presents with chest pain.  Also complains of left leg pain.  Low heart score, I do not feel this patient's chest pain is cardiac in nature due to the characteristics of the history and patient's risk factors.   D-dimer negative  I do not feel this patient has an aortic dissection as no tearing pain radiating to every 8 do wound care for [facility] 1 the back, normal mediastinum on xray, normal pulses in all extremities and no neurological complaints.     Serial enzymes and troponin is negative.  Most likely suspect possible reflux.  Patient ate a sandwich.  Do not suspect DVT PE or ACS   patient given cardiology follow-up given    Patient was given clear instructions on when to return to the emergency department and follow up instructions.          Diagnosis and Treatment Plan       Clinical Impression:   1. Chest pain in adult        Treatment Plan:   ED Disposition     ED Disposition Condition Date/Time Comment    Discharge  Sat Mar 28, 2019  3:27 AM [name] [name] discharge to home/self care.    Condition at disposition: Stable              Doctor's Notes     Throughout the stay in the Emergency Department, questions and concerns surrounding pain control, care plans, diagnostic studies, effects of medications administered or prescribed, and future prognostic dilemmas were assessed and addressed.    ROS addendum: The patient and/or family was asked if they had any other complaints or concerns that we could address today and nothing of significance was noted.        Lynita Lombard, [place]  03/28/19 1610       Darcus Pester, MD  04/02/19 [phone]

## 2019-03-27 NOTE — EDIE (Signed)
COLLECTIVE?NOTIFICATION?03/27/2019 22:50?[name] [name], [name] [name]?MRN: [id number]    Criteria Met      5 ED Visits in 12 Months    Security and Safety  No recent Security Events currently on file    ED Care Guidelines  There are currently no ED Care Guidelines for this patient. Please check your facility's medical records system.        Prescription Monitoring Program  000??- Narcotic Use Score  000??- Sedative Use Score  000??- Stimulant Use Score  000??- Overdose Risk Score  - All Scores range from 000-999 with 75% of the population scoring < 200 and on 1% scoring above 650  - The last digit of the narcotic, sedative, and stimulant score indicates the number of active prescriptions of that type  - Higher Use scores correlate with increased prescribers, pharmacies, mg equiv, and overlapping prescriptions  - Higher Overdose Risk Scores correlate with increased risk of unintentional overdose death   Concerning or unexpectedly high scores should prompt a review of the PMP record; this does not constitute checking PMP for prescribing purposes.      E.D. Visit Count (12 mo.)  Facility Visits   [place] [facility] 5   Total 5   Note: Visits indicate total known visits.     Recent Emergency Department Visit Summary  Date Facility [facility] Type Diagnoses or Chief Complaint   Mar 27, 2019 [place] [facility] [place] [name] [place] [facility]      CP      Feb 11, 2019 [place] [facility] [place] [name] [place] [facility]      Chet tightness      Chest Pain      Other chronic pain      Other chest pain      Malingerer [conscious simulation]      Feb 03, 2019 [place] [facility] [place] [name] [place] [facility]      Chest pain      Heart palpitation      Shortness of Breath      Other chest pain      Nicotine dependence, unspecified, uncomplicated      Other psychoactive substance abuse, uncomplicated      Jan 04, 2019 [place] [facility] [place] [name] [place] [facility]      syncopal episode      Syncope      Altered mental status, unspecified      Jan 02, 2019 [place] [facility]  [place] [name] [place] [facility]      abd pain      Abdominal Pain      Chest pain, unspecified      Other psychoactive substance abuse, uncomplicated      Generalized abdominal pain      Brief psychotic disorder          Recent Inpatient Visit Summary  No recorded inpatient visits.     Care Team  There is not a care team on record at this time.   Collective Portal  This patient has registered at the [facility] Emergency Department   For more information visit: [email or web address]     PLEASE NOTE:     1.   Any care recommendations and other clinical information are provided as guidelines or for historical purposes only, and providers should exercise their own clinical judgment when providing care.    2.   You may only use this information for purposes of treatment, payment or health care operations activities, and subject to the limitations of applicable  Collective Policies.    3.   You should consult directly with the organization that provided a care guideline or other clinical history with any questions about additional information or accuracy or completeness of information provided.    ? 2021 [employer] - www.collectivemedical.com

## 2019-03-27 NOTE — ED Notes (Signed)
Bed: PU36  Expected date: 03/27/19  Expected time: 10:45 PM  Means of arrival: [facility]  Comments:

## 2019-03-28 LAB — ECG 12-LEAD
Atrial Rate: 91 {beats}/min
Atrial Rate: 69 {beats}/min
Atrial Rate: 69 {beats}/min
P Axis: 54 degrees
P Axis: 59 degrees
P Axis: 38 degrees
P-R Interval: 148 ms
P-R Interval: 146 ms
P-R Interval: 148 ms
Q-T Interval: 358 ms
Q-T Interval: 400 ms
Q-T Interval: 394 ms
QRS Duration: 96 ms
QRS Duration: 90 ms
QRS Duration: 88 ms
QTC Calculation (Bezet): 440 ms
QTC Calculation (Bezet): 428 ms
QTC Calculation (Bezet): 422 ms
R Axis: -42 degrees
R Axis: -41 degrees
R Axis: -34 degrees
T Axis: 50 degrees
T Axis: 2 degrees
T Axis: -10 degrees
Ventricular Rate: 91 {beats}/min
Ventricular Rate: 69 {beats}/min
Ventricular Rate: 69 {beats}/min

## 2019-03-28 LAB — IHS D-DIMER: D-Dimer: 0.29 ug/mL FEU (ref 0.00–0.70)

## 2019-03-28 LAB — TROPONIN I: Troponin I: <0.01 ng/mL (ref 0.00–0.05)

## 2019-03-28 MED ORDER — ACETAMINOPHEN 325 MG PO TABS
650.0000 mg | ORAL_TABLET | Freq: Once | ORAL | Status: DC
Start: 2019-03-28 — End: 2019-03-28

## 2019-03-28 MED ORDER — CYCLOBENZAPRINE HCL 10 MG PO TABS
10.0000 mg | ORAL_TABLET | Freq: Once | ORAL | Status: AC
Start: 2019-03-28 — End: 2019-03-28
  Administered 2019-03-28: 01:00:00 10 mg via ORAL
  Filled 2019-03-28: qty 1

## 2019-03-28 NOTE — Discharge Instructions (Signed)
Low Cost Healthcare Resources in [facility]/Federally Qualified Health Centers    [employer]   www.[email or web address]://[email or web address]/    Adult Medicine and [facility]  [address]  [place], [place] [address]   [phone]    Pediatrics, [address] [place], [place] [address], [phone]    [facility] Services, [phone]     ---------------------------------------------------------------------------------------  [facility], [address], [place], [place] [address], [phone]  [email or web address]    [facility], [address], [place], [place] [address], [phone]         ---------------------------------------------------------------------------------------  (Greater) [facility], [place] [facility] [address], [place], [place] [address], [phone], [address] [place], [place] [address], [phone]    ---------------------------------------------------------------------------------------    Health Departments    ---------------------------------------------------------------------------------------    [facility], [address] [place], [place] [address], [phone]  [email or web address]    [facility], [address], [place], [place] [address], [phone]  [email or web address]    ---------------------------------------------------------------------------------------  [facility], [address], [place] [address], [phone]    ---------------------------------------------------------------------------------------  [facility], [address], [place], [place] [address], [phone]    [employer], [address], [place], [place] [address],  [phone]  [email or web address]    [facility], [address] [place], [place] [address], [phone], [address], [place], [place] [address], [phone]    [facility], [address] 1st Floor, [place], [place] [address], [phone]    ---------------------------------------------------------------------------------------  [facility], [address]st Floor, [place], [place] [address], [phone]    ---------------------------------------------------------------------------------------  [facility], [address], [place], [place] [address], [phone]    [facility], [phone]    ---------------------------------------------------------------------------------------  [facility], [address], [place], [place] [address], [phone]  [facility], [address], [place], [place] [address], [phone]    [facility], [address] [place], [place] [address], [phone], [address] [place], [place], [place] [address], [phone]    ---------------------------------------------------------------------------------------    Free Clinics    ---------------------------------------------------------------------------------------  ---------------------------------------------------------------------------------------  [facility],   InvestmentInstructor.be    [facility],  http://garcia-smith.com/    [facility],   MobileCamcorder.com.br    [facility], www.TanPlex.uy    [employer], www.vafreeclinics.org/[place]-free-clinics.asp#culc    ---------------------------------------------------------------------------------------    Other Resources    ---------------------------------------------------------------------------------------  ---------------------------------------------------------------------------------------  [facility], [address], [place], [place] [address], [phone]  www.[email or web address].[facility], [address] [place], [place] [address], [phone]  MakePoetry.hu    [facility], [address] [place], [place], [place] [address], [phone]    ---------------------------------------------------------------------------------------    Dental    ---------------------------------------------------------------------------------------  ---------------------------------------------------------------------------------------  [facility], [address], [address] [place], [place] [address], [phone]    [facility], [address] 1st Floor, [place], [place] [address], [phone]  By appointment only    [facility]  [address]), [address]nd [place], [place] [address], [phone]    [facility]  [phone]  [address] [place], [place], [phone]  [address], [place], [place], [phone]  [address], [place]. [place], [place], [phone]    [facility], [address], [place], [place], [phone]       ---------------------------------------------------------------------------------------  Health Insurance Locators    ---------------------------------------------------------------------------------------  ---------------------------------------------------------------------------------------  [employer] for Health Coverage Education,  http://www.wolf.info/    [employer]urance,   http://www.ehealthinsurance.com    [employer],   http://www.healthinsurancesort.com    Health Insurance Online,   http://www.online-health-insurance.com    Health Plan One,   http://www.healthplanone.com    http://www.long-jenkins.com/,    http://www.healthcare.gov    [employer], http://www.novaclinics.org/home  [facility])    ---------------------------------------------------------------------------------------    Medication Resources    ---------------------------------------------------------------------------------------  ---------------------------------------------------------------------------------------  [employer], http://www.fairfaxrxdiscountcard.com, [phone], EXT 5 Helpdesk    Partnership for Prescriptions Assistance,  http://www.pparx.org    [employer]edy[employer]eds,       http://www.needymeds.[employer],    [phone]    [employer],      https://www.farmer-stevens.info/    [employer] of Location manager (NAIC),      http://www.insureuonline.org    Health Insurance Info Net,     http://www.walters-mcconnell.com/ Pain of Unclear Etiology    You have been seen for chest pain. The cause of your pain is not yet known.    Your doctor has learned about your medical history, examined you, and checked any tests that were done. Still, it is not clear why you are having pain. The doctor thinks there is only a small chance that your pain is caused by a health problem that could lead to serious harm or death. Later, your primary care doctor might do more tests or check you again.    Sometimes chest pain is caused by a health problem that can lead to death, like a:   Heart attack.   Injury to the large blood vessel in your body (aorta).   Blood  clot in the lung.   Collapsed lung.     It is not likely that your pain is caused by a health problem that could lead to death if:    Your chest pain lasts only a few seconds at a time   You are not short of breath, nauseated (sick to your stomach), sweaty, or lightheaded   Your pain gets worse when you twist or bend   Your pain improves with exercise or hard work.    Chest pain is serious. It is very important that you follow up with your regular doctor.    Return here or go to the nearest Emergency Department immediately if:   Your pain makes you short of breath, nauseated (sick to your stomach), or sweaty.   Your pain gets worse when you walk, go up stairs, or exert yourself.   You feel weak, lightheaded, or faint.   It hurts to breathe.   Your leg swells.   Your pain or symptoms get worse    You have new symptoms or concerns.    If you can't follow up with your doctor, or if at any time you feel you need to be rechecked or seen again, come back here or go to the nearest emergency department.

## 2019-04-10 ENCOUNTER — Ambulatory Visit (INDEPENDENT_AMBULATORY_CARE_PROVIDER_SITE_OTHER): Payer: Self-pay | Admitting: Cardiovascular Disease

## 2019-04-10 LAB — VAHRT HISTORIC LVEF: Ejection Fraction: 55 %

## 2019-04-14 ENCOUNTER — Other Ambulatory Visit: Payer: Self-pay | Admitting: Cardiovascular Disease

## 2019-04-14 DIAGNOSIS — R9431 Abnormal electrocardiogram [ECG] [EKG]: Secondary | ICD-10-CM

## 2019-04-17 ENCOUNTER — Other Ambulatory Visit: Payer: Medicaid HMO

## 2019-04-24 ENCOUNTER — Other Ambulatory Visit: Payer: Self-pay | Admitting: Cardiovascular Disease

## 2019-04-24 DIAGNOSIS — M79605 Pain in left leg: Secondary | ICD-10-CM

## 2019-05-08 ENCOUNTER — Ambulatory Visit: Payer: Medicaid HMO

## 2019-05-12 ENCOUNTER — Emergency Department
Admission: EM | Admit: 2019-05-12 | Discharge: 2019-05-12 | Disposition: A | Discharge status: Home / Self Care | Payer: Medicaid HMO | Attending: Emergency Medicine | Admitting: Emergency Medicine

## 2019-05-12 ENCOUNTER — Emergency Department: Payer: Medicaid HMO

## 2019-05-12 DIAGNOSIS — R0602 Shortness of breath: Secondary | ICD-10-CM | POA: Insufficient documentation

## 2019-05-12 DIAGNOSIS — R079 Chest pain, unspecified: Secondary | ICD-10-CM | POA: Insufficient documentation

## 2019-05-12 DIAGNOSIS — K219 Gastro-esophageal reflux disease without esophagitis: Secondary | ICD-10-CM | POA: Insufficient documentation

## 2019-05-12 DIAGNOSIS — F17210 Nicotine dependence, cigarettes, uncomplicated: Secondary | ICD-10-CM | POA: Insufficient documentation

## 2019-05-12 LAB — COMPREHENSIVE METABOLIC PANEL
ALT: 21 U/L (ref 0–55)
AST (SGOT): 20 U/L (ref 5–34)
Albumin/Globulin Ratio: 1.4 (ref 0.9–2.2)
Albumin: 4 g/dL (ref 3.5–5.0)
Alkaline Phosphatase: 68 U/L (ref 38–106)
Anion Gap: 10 (ref 5.0–15.0)
BUN: 10 mg/dL (ref 9–28)
Bilirubin, Total: 0.5 mg/dL (ref 0.2–1.2)
CO2: 25 mEq/L (ref 22–29)
Calcium: 9.2 mg/dL (ref 8.5–10.5)
Chloride: 101 mEq/L (ref 100–111)
Creatinine: 0.7 mg/dL (ref 0.7–1.3)
Globulin: 2.9 g/dL (ref 2.0–3.6)
Glucose: 110 mg/dL — ABNORMAL HIGH (ref 70–100)
Potassium: 4 mEq/L (ref 3.5–5.1)
Protein, Total: 6.9 g/dL (ref 6.0–8.3)
Sodium: 136 mEq/L (ref 136–145)

## 2019-05-12 LAB — GFR: EGFR: >60

## 2019-05-12 LAB — CBC AND DIFFERENTIAL
Absolute NRBC: 0 10*3/uL (ref 0.00–0.00)
Basophils Absolute Automated: 0.04 10*3/uL (ref 0.00–0.08)
Basophils Automated: 0.5 %
Eosinophils Absolute Automated: 0.32 10*3/uL (ref 0.00–0.44)
Eosinophils Automated: 3.9 %
Hematocrit: 37.4 % — ABNORMAL LOW (ref 37.6–49.6)
Hgb: 12.5 g/dL (ref 12.5–17.1)
Immature Granulocytes Absolute: 0.02 10*3/uL (ref 0.00–0.07)
Immature Granulocytes: 0.2 %
Lymphocytes Absolute Automated: 2.2 10*3/uL (ref 0.42–3.22)
Lymphocytes Automated: 27.1 %
MCH: 30.3 pg (ref 25.1–33.5)
MCHC: 33.4 g/dL (ref 31.5–35.8)
MCV: 90.6 fL (ref 78.0–96.0)
MPV: 10.8 fL (ref 8.9–12.5)
Monocytes Absolute Automated: 0.78 10*3/uL (ref 0.21–0.85)
Monocytes: 9.6 %
Neutrophils Absolute: 4.75 10*3/uL (ref 1.10–6.33)
Neutrophils: 58.7 %
Nucleated RBC: 0 /100 WBC (ref 0.0–0.0)
Platelets: 192 10*3/uL (ref 142–346)
RBC: 4.13 10*6/uL — ABNORMAL LOW (ref 4.20–5.90)
RDW: 13 % (ref 11–15)
WBC: 8.11 10*3/uL (ref 3.10–9.50)

## 2019-05-12 LAB — TROPONIN I
Troponin I: <0.01 ng/mL (ref 0.00–0.05)
Troponin I: <0.01 ng/mL (ref 0.00–0.05)

## 2019-05-12 LAB — ETHANOL: Alcohol: NOT DETECTED mg/dL

## 2019-05-12 MED ORDER — KETOROLAC TROMETHAMINE 30 MG/ML IJ SOLN
30.00 mg | Freq: Once | INTRAMUSCULAR | Status: DC
Start: 2019-05-12 — End: 2019-05-13
  Filled 2019-05-12: qty 1

## 2019-05-12 MED ORDER — METHOCARBAMOL 500 MG PO TABS
750.00 mg | ORAL_TABLET | Freq: Once | ORAL | Status: AC
Start: 2019-05-12 — End: 2019-05-12
  Administered 2019-05-12: 20:00:00 750 mg via ORAL
  Filled 2019-05-12: qty 2

## 2019-05-12 NOTE — Discharge Instructions (Signed)
You were seen for chest pain  less started today.  Your cardiac work-up in the emergency department today was unremarkable for any acute findings.  You refused appropriate treatment while in the emergency department.  Please follow-up with cardiology and neighborhood health clinic within 2 to 3 days.  If symptoms worsen come back to the emergency department.

## 2019-05-12 NOTE — ED Provider Notes (Signed)
EMERGENCY DEPARTMENT NOTE     Patient initially seen and examined at   ED PHYSICIAN ASSIGNED     None         ED MIDLEVEL (APP) ASSIGNED     Date/Time Event User Comments    05/12/19 1828 PA/NP Provider Assigned Murrell Converse, Sohan Potvin Sheldon Silvan, FNP assigned as Nurse Practitioner          HISTORY OF PRESENT ILLNESS   Historian:Patient  Translator Used: no    Chief Complaint: Chest Pain      54 y.o. male with a PMH of GERD, heroin and cocaine abuse presents with constant midsternal sharp chest pain that radiates to the left chest assoacitae mild shortness of breath that began approximately 1815 tonight while " just sitting in chilling."  No aggravating factors.  Has not tried pain medication prior to arrival.  + Drinking 1 beer today.  Denies fever chills, URI symptoms, nausea vomiting, diaphoresis, numbness tingling, abdominal pain, substance abuse.  Does not have a cardiologist.  No known sick contacts.    1. Location of symptoms: Chest  2. Onset of symptoms: Today  3. What was patient doing when symptoms started (Context): see above  4. Severity: moderate  5. Timing: Constant  6. Activities that worsen symptoms: None  7. Activities that improve symptoms: None   8. Quality: Sharp  9. Radiation of symptoms: no  10. Associated signs and Symptoms: see above  11. Are symptoms worsening? yes  MEDICAL HISTORY     Past Medical History:  Past Medical History:   Diagnosis Date    Depression     Gallstones     Gastritis     Heart murmur     Renal calculus        Past Surgical History:  History reviewed. No pertinent surgical history.    Social History:  Social History     Socioeconomic History    Marital status: Single     Spouse name: Not on file    Number of children: Not on file    Years of education: Not on file    Highest education level: Not on file   Occupational History    Not on file   Social Needs    Financial resource strain: Not on file    Food insecurity     Worry: Not on file     Inability: Not on file     Transportation needs     Medical: Not on file     Non-medical: Not on file   Tobacco Use    Smoking status: Current Every Day Smoker     Packs/day: 1.00     Years: 0.50     Pack years: 0.50     Types: Cigarettes    Smokeless tobacco: Never Used   Substance and Sexual Activity    Alcohol use: Not Currently    Drug use: Yes     Types: Marijuana, Cocaine     Comment: heroin     Sexual activity: Not on file   Lifestyle    Physical activity     Days per week: Not on file     Minutes per session: Not on file    Stress: Not on file   Relationships    Social connections     Talks on phone: Not on file     Gets together: Not on file     Attends religious service: Not on file     Active member of  club or organization: Not on file     Attends meetings of clubs or organizations: Not on file     Relationship status: Not on file    Intimate partner violence     Fear of current or ex partner: Not on file     Emotionally abused: Not on file     Physically abused: Not on file     Forced sexual activity: Not on file   Other Topics Concern    Not on file   Social History Narrative    Not on file       Family History:  Family History   Problem Relation Age of Onset    Diabetes Mother     Hypertension Mother        Outpatient Medication:  There are no discharge medications for this patient.        REVIEW OF SYSTEMS   Review of Systems   Constitutional: Negative for chills, diaphoresis, fever and malaise/fatigue.   HENT: Negative for congestion.    Respiratory: Positive for shortness of breath. Negative for cough.    Cardiovascular: Positive for chest pain. Negative for palpitations.   Gastrointestinal: Negative for nausea and vomiting.   Neurological: Negative for dizziness.   Psychiatric/Behavioral: Negative for substance abuse.   All other systems reviewed and are negative.      PHYSICAL EXAM     ED Triage Vitals [05/12/19 1830]   Enc Vitals Group      BP 153/86      Heart Rate (!) 58      Resp Rate 18      Temp 98.8 F  (37.1 C)      Temp Source Oral      SpO2 100 %      Weight 84.8 kg      Height       Head Circumference       Peak Flow       Pain Score 6      Pain Loc       Pain Edu?       Excl. in GC?      Vitals:    05/12/19 2003 05/12/19 2142 05/12/19 2235 05/12/19 2300   BP: 131/78 133/80  126/78   Pulse: 68 68 64 63   Resp: 18 18 16 15    Temp: 98.6 F (37 C) 98.6 F (37 C)     TempSrc: Oral Oral     SpO2: 100% 100% 99% 98%   Weight:         Physical Exam  Vitals signs and nursing note reviewed.   Constitutional:       General: He is not in acute distress.     Appearance: Normal appearance. He is well-developed. He is not ill-appearing, toxic-appearing or diaphoretic.   HENT:      Head: Normocephalic and atraumatic.   Eyes:      Extraocular Movements: Extraocular movements intact.      Conjunctiva/sclera: Conjunctivae normal.      Pupils: Pupils are equal, round, and reactive to light.   Neck:      Musculoskeletal: Normal range of motion and neck supple.   Cardiovascular:      Rate and Rhythm: Normal rate and regular rhythm.      Heart sounds: Normal heart sounds.   Pulmonary:      Effort: Pulmonary effort is normal. No respiratory distress.      Breath sounds: Normal breath sounds. No decreased breath sounds,  wheezing, rhonchi or rales.   Chest:      Chest wall: Tenderness present.   Abdominal:      General: Bowel sounds are normal.      Palpations: Abdomen is soft.   Musculoskeletal: Normal range of motion.   Skin:     General: Skin is warm and dry.   Neurological:      General: No focal deficit present.      Mental Status: He is alert and oriented to person, place, and time.   Psychiatric:         Mood and Affect: Affect is inappropriate.         Behavior: Behavior is agitated.           MEDICAL DECISION MAKING     DISCUSSION       Reproducible CP on exam. Low suspicion PE, COVID, and CP is of cardiac etiology. Cardiac workup unremarkable. EKG SB w SA with no significant change from previous. CXR negative for acute  pathology. Two negative troponin and labs grossly unremarkable. Patient refused all medications offered except for robaxin. USD not obtained.   Upon chart review, patient seen in ED multiple times for same chest pain.   Results discussed with patient and recommended to follow up with cardiology within 1-2 days.  Discharge instructions were reviewed with patient and they were given the opportunity to ask any questions regarding their care today. Patient understood and agreed with treatment, plan of care, and strict return precautions. Stable to discharge.    DDx: MSK pain, COVID-19, substance abuse induced angina, ACS, PE      The patient is NOT septic.  All labs and vital signs from the current visit have been   reviewed and any abnormality that is present is not due to sepsis.    Vital Signs: Reviewed the patients vital signs.   Nursing Notes: Reviewed and utilized available nursing notes.  Medical Records Reviewed: Reviewed available past medical records. Notes, labs, and imaging reviewed.  Counseling: The emergency provider has spoken with the patient and discussed todays findings, in addition to providing specific details for the plan of care.  Questions are answered and there is agreement with the plan.        CARDIAC STUDIES    The following cardiac studies were independently interpreted by the Emergency Medicine Physician.  For full cardiac study results please see chart.    EKG Interpretation:  Signed and interpreted by me Murrell Converse)  Time Interpreted: [phone]  Comparison: no significant change - 03/28/19  Rate: 59  Rhythm: SB w SA  Axis: left  Intervals: normal  Blocks: none  ST segments: no elevations or depressions  Interpretation: Nonspecific  EKG    EMERGENCY IMAGING STUDIES    The following imagine studies were independently interpreted by me (emergency physician):    Radiology:  Interpreted by me Murrell Converse)  Study: Chest Xray   Results: No infiltrate. No pneumothorax. No hemothorax. No cardiomegaly. No  CHF.  Impression: No acute intrathoracic abnormality.    RADIOLOGY IMAGING STUDIES      Chest 2 Views   Final Result    No acute cardiopulmonary findings.       Wallie Renshaw, DO    05/12/2019 7:36 PM            PULSE OXIMETRY    Oxygen Saturation by Pulse Oximetry: 98%  Interventions: none  Interpretation:  Stable on RA    EMERGENCY DEPT. MEDICATIONS  ED Medication Orders (From admission, onward)    Start Ordered     Status Ordering Provider    05/12/19 1951 05/12/19 1950  methocarbamol (ROBAXIN) tablet 750 mg  Once in ED     Route: Oral  Ordered Dose: 750 mg     Last MAR action: Given Lewin Pellow D    05/12/19 1951 05/12/19 1950    Once     Route: Intravenous  Ordered Dose: 30 mg     Discontinued Larcenia Holaday D          LABORATORY RESULTS    Ordered and independently interpreted AVAILABLE laboratory tests. Please see results section in chart for full details.  Results for orders placed or performed during the hospital encounter of 05/12/19   Comprehensive metabolic panel   Result Value Ref Range    Glucose 110 (H) 70 - 100 mg/dL    BUN 10 9 - 28 mg/dL    Creatinine 0.7 0.7 - 1.3 mg/dL    Sodium 213 086 - 578 mEq/L    Potassium 4.0 3.5 - 5.1 mEq/L    Chloride 101 100 - 111 mEq/L    CO2 25 22 - 29 mEq/L    Calcium 9.2 8.5 - 10.5 mg/dL    Protein, Total 6.9 6.0 - 8.3 g/dL    Albumin 4.0 3.5 - 5.0 g/dL    AST (SGOT) 20 5 - 34 U/L    ALT 21 0 - 55 U/L    Alkaline Phosphatase 68 38 - 106 U/L    Bilirubin, Total 0.5 0.2 - 1.2 mg/dL    Globulin 2.9 2.0 - 3.6 g/dL    Albumin/Globulin Ratio 1.4 0.9 - 2.2    Anion Gap 10.0 5.0 - 15.0   CBC and differential   Result Value Ref Range    WBC 8.11 3.10 - 9.50 x10 3/uL    Hgb 12.5 12.5 - 17.1 g/dL    Hematocrit 46.9 (L) 37.6 - 49.6 %    Platelets 192 142 - 346 x10 3/uL    RBC 4.13 (L) 4.20 - 5.90 x10 6/uL    MCV 90.6 78.0 - 96.0 fL    MCH 30.3 25.1 - 33.5 pg    MCHC 33.4 31.5 - 35.8 g/dL    RDW 13 11 - 15 %    MPV 10.8 8.9 - 12.5 fL    Neutrophils 58.7 None %    Lymphocytes  Automated 27.1 None %    Monocytes 9.6 None %    Eosinophils Automated 3.9 None %    Basophils Automated 0.5 None %    Immature Granulocytes 0.2 None %    Nucleated RBC 0.0 0.0 - 0.0 /100 WBC    Neutrophils Absolute 4.75 1.10 - 6.33 x10 3/uL    Lymphocytes Absolute Automated 2.20 0.42 - 3.22 x10 3/uL    Monocytes Absolute Automated 0.78 0.21 - 0.85 x10 3/uL    Eosinophils Absolute Automated 0.32 0.00 - 0.44 x10 3/uL    Basophils Absolute Automated 0.04 0.00 - 0.08 x10 3/uL    Immature Granulocytes Absolute 0.02 0.00 - 0.07 x10 3/uL    Absolute NRBC 0.00 0.00 - 0.00 x10 3/uL   Troponin I   Result Value Ref Range    Troponin I <0.01 0.00 - 0.05 ng/mL   GFR   Result Value Ref Range    EGFR >60.0    Ethanol (Alcohol) Level   Result Value Ref Range    Alcohol None Detected None Detected mg/dL  Troponin I   Result Value Ref Range    Troponin I <0.01 0.00 - 0.05 ng/mL   ECG 12 Lead   Result Value Ref Range    Ventricular Rate 59 BPM    Atrial Rate 59 BPM    P-R Interval 150 ms    QRS Duration 88 ms    Q-T Interval 406 ms    QTC Calculation (Bezet) 401 ms    P Axis 12 degrees    R Axis -31 degrees    T Axis -18 degrees       CRITICAL CARE/PROCEDURES    Procedures    DIAGNOSIS      Diagnosis:  Final diagnoses:   Chest pain, unspecified type       Disposition:  ED Disposition     ED Disposition Condition Date/Time Comment    Discharge  Tue May 12, 2019 10:29 PM [name] discharge to home/self care.    Condition at disposition: Stable            Prescriptions:  There are no discharge medications for this patient.      This note was generated by the Epic EMR system/ Dragon speech recognition and may contain inherent errors or omissions not intended by the user. Grammatical errors, random word insertions, deletions and pronoun errors  are occasional consequences of this technology due to software limitations. Not all errors are caught or corrected. If there are questions or concerns about the content of this note or  information contained within the body of this dictation they should be addressed directly with the author for clarification.     Sheldon Silvan, FNP  05/13/19 1622

## 2019-05-12 NOTE — ED Notes (Signed)
Attempted to obtain urine at this time Pt refused. Provider aware.

## 2019-05-12 NOTE — EDIE (Signed)
COLLECTIVE?NOTIFICATION?05/12/2019 18:24?[name], Mangel R?MRN: [id number]    [facility]'s patient encounter information:   GLO:?[id number]  Account [id number]  Billing Account [id number]      Criteria Met      5 ED Visits in 12 Months    Security and Safety  No recent Security Events currently on file    ED Care Guidelines  There are currently no ED Care Guidelines for this patient. Please check your facility's medical records system.        Prescription Monitoring Program  000??- Narcotic Use Score  000??- Sedative Use Score  000??- Stimulant Use Score  000??- Overdose Risk Score  - All Scores range from 000-999 with 75% of the population scoring < 200 and on 1% scoring above 650  - The last digit of the narcotic, sedative, and stimulant score indicates the number of active prescriptions of that type  - Higher Use scores correlate with increased prescribers, pharmacies, mg equiv, and overlapping prescriptions  - Higher Overdose Risk Scores correlate with increased risk of unintentional overdose death   Concerning or unexpectedly high scores should prompt a review of the PMP record; this does not constitute checking PMP for prescribing purposes.      E.D. Visit Count (12 mo.)  Facility Visits   [place] [facility] 1   [facility] 5   Total 6   Note: Visits indicate total known visits.     Recent Emergency Department Visit Summary  Date Facility [facility] Type Diagnoses or Chief Complaint   May 12, 2019 [place] [name] [facility]      chest pain      Mar 27, 2019 [place] [facility] [place] [name] [place] [facility]      CP      Chest Pain      Chest pain, unspecified      Feb 11, 2019 [place] [facility] [place] [name] [place] [facility]      Chet tightness      Chest Pain      Other chronic pain      Other chest pain      Malingerer [conscious simulation]      Feb 03, 2019 [place] [facility] [place] [name] [place] [facility]      Chest pain      Heart palpitation       Shortness of Breath      Other chest pain      Nicotine dependence, unspecified, uncomplicated      Other psychoactive substance abuse, uncomplicated      Jan 04, 2019 [place] [facility] [place] [name] [place] [facility]      syncopal episode      Syncope      Altered mental status, unspecified      Jan 02, 2019 [place] [facility] [place] [name] [place] [facility]      abd pain      Abdominal Pain      Chest pain, unspecified      Other psychoactive substance abuse, uncomplicated      Generalized abdominal pain      Brief psychotic disorder          Recent Inpatient Visit Summary  No recorded inpatient visits.     Care Team  Provider Specialty Phone Fax Service Dates   SPARKS, Darrol Jump, NP-C Nurse Practitioner: Family   Current      Collective Portal  This patient has registered at the [facility] Emergency Department   For more information visit: [email or web address]     PLEASE NOTE:     1.   Any care recommendations and other clinical information are provided as guidelines or for historical purposes only, and providers should exercise their own clinical judgment when providing care.    2.   You may only use this information for purposes of treatment, payment or health care operations activities, and subject to the limitations of applicable Collective Policies.    3.   You should consult directly with the organization that provided a care guideline or other clinical history with any questions about additional information or accuracy or completeness of information provided.    ? 2021 [place], [employer] - [email or web address]

## 2019-05-13 LAB — ECG 12-LEAD
Atrial Rate: 59 {beats}/min
P Axis: 12 degrees
P-R Interval: 150 ms
Q-T Interval: 406 ms
QRS Duration: 88 ms
QTC Calculation (Bezet): 401 ms
R Axis: -31 degrees
T Axis: -18 degrees
Ventricular Rate: 59 {beats}/min

## 2019-05-15 ENCOUNTER — Ambulatory Visit (INDEPENDENT_AMBULATORY_CARE_PROVIDER_SITE_OTHER): Payer: Medicaid HMO | Admitting: Cardiovascular Disease

## 2019-05-15 NOTE — Progress Notes (Deleted)
[place] [facility]    No chief complaint on file.      Assessment and Plan       There are no diagnoses linked to this encounter.    History of Present Illness     ***  54 y.o. male with a PMH of GERD, heroin and cocaine abuse presents with constant midsternal sharp chest pain that radiates to the left chest assoacitae mild shortness of breath that began approximately 1815 tonight while " just sitting in chilling."  No aggravating factors.  Has not tried pain medication prior to arrival.  + Drinking 1 beer today.  Denies fever chills, URI symptoms, nausea vomiting, diaphoresis, numbness tingling, abdominal pain, substance abuse.  Does not have a cardiologist.  No known sick contacts.    Past Medical History     Past Medical History:   Diagnosis Date    Depression     Gallstones     Gastritis     Heart murmur     Renal calculus        Past Surgical History     No past surgical history on file.    Family History     Family History   Problem Relation Age of Onset    Diabetes Mother     Hypertension Mother        Social History     Social History     Socioeconomic History    Marital status: Single     Spouse name: Not on file    Number of children: Not on file    Years of education: Not on file    Highest education level: Not on file   Occupational History    Not on file   Tobacco Use    Smoking status: Current Every Day Smoker     Packs/day: 1.00     Years: 0.50     Pack years: 0.50     Types: Cigarettes    Smokeless tobacco: Never Used   Substance and Sexual Activity    Alcohol use: Not Currently    Drug use: Yes     Types: Marijuana, Cocaine     Comment: heroin     Sexual activity: Not on file   Other Topics Concern    Not on file   Social History Narrative    Not on file     Social Determinants of Health     Financial Resource Strain:     Difficulty of Paying Living Expenses:    Food Insecurity:     Worried About Programme researcher, broadcasting/film/video in the Last Year:     Barista in the Last  Year:    Transportation Needs:     Freight forwarder (Medical):     Lack of Transportation (Non-Medical):    Physical Activity:     Days of Exercise per Week:     Minutes of Exercise per Session:    Stress:     Feeling of Stress :    Social Connections:     Frequency of Communication with Friends and Family:     Frequency of Social Gatherings with Friends and Family:     Attends Religious Services:     Active Member of Clubs or Organizations:     Attends Banker Meetings:     Marital Status:    Intimate Partner Violence:     Fear of Current or Ex-Partner:     Emotionally Abused:  Physically Abused:     Sexually Abused:        Allergies     Allergies   Allergen Reactions    Gluten      Pt states "white bread"    Percocet [Oxycodone-Acetaminophen]     Tylenol [Acetaminophen] Hives     Tyelnol#3, hives and throw up       Medications     No current outpatient medications on file.     No current facility-administered medications for this visit.         Review of Systems     Constitutional: Negative for fevers and chills  Skin: No rash or lesions  Respiratory: Negative for cough, wheezing, or hemoptysis  Cardiovascular: as per HPI  Gastrointestinal: Negative for abdominal pain, nausea, vomiting and diarrhea  Musculoskeletal:  No arthritic symptoms  Genitourinary: Negative for dysuria  Otherwise 10 point review of systems is negative.      Physical Exam     There were no vitals filed for this visit.    There is no height or weight on file to calculate BMI.    General:  Patient appears their stated age, well-nourished.  Alert and in no apparent distress.  Eyes: No conjunctivitis, no purulent discharge, no lid lag  ENT:  Hearing grossly intact, Nares patent bilaterally, Lips moist, color appropriate for race.  Respiratory: Clear to auscultation and percussion throughout. Respiratory effort unlabored, chest expansion symmetric.    Cardio: Regular rate and rhythm. Normal S1/S2 No carotid  bruits or thrills, no JVD.  Extremities: warm, pulses 2+, no peripheral edema  GI: Soft, nondistended, nontender.  No guarding or rebound.  Skin: Color appropriate for race, Skin warm, dry, and intact  Psychiatric: Good insight and judgment, oriented to person, place, and time    Labs     Lipid Panel   Cholesterol   Date/Time Value Ref Range Status   08/06/2015 02:40 AM 149 0 - 199 mg/dL Final     Triglycerides   Date/Time Value Ref Range Status   08/06/2015 02:40 AM 62 34 - 149 mg/dL Final     HDL   Date/Time Value Ref Range Status   08/06/2015 02:40 AM 55 40 - 9,999 mg/dL Final     Comment:     An HDL cholesterol <40 mg/dL is low and constitutes a  coronary heart disease risk factor, and HDL-C>59 mg/dL is  a negative risk factor for CHD.  Ref: American Heart Association; Circulation 2004       CBC   Lab Results   Component Value Date    WBC 8.11 05/12/2019    HGB 12.5 05/12/2019    HCT 37.4 (L) 05/12/2019    MCV 90.6 05/12/2019    PLT 192 05/12/2019      BMP  Lab Results   Component Value Date    CO2 25 05/12/2019    BUN 10 05/12/2019     INR   Lab Results   Component Value Date    INR 1.1 04/23/2016    INR 1.0 02/28/2016    INR 1.0 12/04/2010        EKG   I have reviewed and interpreted the EKG.  The EKG is significant for***          I spent *** mins with the patient, >50% of the time was spent on education and counseling.      Renard Hamper, MD, MS    [facility] Electrophysiology

## 2019-05-28 ENCOUNTER — Emergency Department
Admission: EM | Admit: 2019-05-28 | Discharge: 2019-05-29 | Disposition: A | Discharge status: Home / Self Care | Payer: Medicaid HMO | Attending: Emergency Medicine | Admitting: Emergency Medicine

## 2019-05-28 ENCOUNTER — Emergency Department: Payer: Medicaid HMO

## 2019-05-28 DIAGNOSIS — Z8719 Personal history of other diseases of the digestive system: Secondary | ICD-10-CM | POA: Insufficient documentation

## 2019-05-28 DIAGNOSIS — R079 Chest pain, unspecified: Secondary | ICD-10-CM

## 2019-05-28 DIAGNOSIS — Z20822 Contact with and (suspected) exposure to covid-19: Secondary | ICD-10-CM | POA: Insufficient documentation

## 2019-05-28 DIAGNOSIS — R0789 Other chest pain: Secondary | ICD-10-CM | POA: Insufficient documentation

## 2019-05-28 DIAGNOSIS — F17210 Nicotine dependence, cigarettes, uncomplicated: Secondary | ICD-10-CM | POA: Insufficient documentation

## 2019-05-28 LAB — CBC AND DIFFERENTIAL
Absolute NRBC: 0 10*3/uL (ref 0.00–0.00)
Basophils Absolute Automated: 0.04 10*3/uL (ref 0.00–0.08)
Basophils Automated: 0.4 %
Eosinophils Absolute Automated: 0.19 10*3/uL (ref 0.00–0.44)
Eosinophils Automated: 2.1 %
Hematocrit: 36.2 % — ABNORMAL LOW (ref 37.6–49.6)
Hgb: 12.3 g/dL — ABNORMAL LOW (ref 12.5–17.1)
Immature Granulocytes Absolute: 0.02 10*3/uL (ref 0.00–0.07)
Immature Granulocytes: 0.2 %
Lymphocytes Absolute Automated: 2.16 10*3/uL (ref 0.42–3.22)
Lymphocytes Automated: 24 %
MCH: 30.7 pg (ref 25.1–33.5)
MCHC: 34 g/dL (ref 31.5–35.8)
MCV: 90.3 fL (ref 78.0–96.0)
MPV: 11.7 fL (ref 8.9–12.5)
Monocytes Absolute Automated: 0.98 10*3/uL — ABNORMAL HIGH (ref 0.21–0.85)
Monocytes: 10.9 %
Neutrophils Absolute: 5.62 10*3/uL (ref 1.10–6.33)
Neutrophils: 62.4 %
Nucleated RBC: 0 /100 WBC (ref 0.0–0.0)
Platelets: 188 10*3/uL (ref 142–346)
RBC: 4.01 10*6/uL — ABNORMAL LOW (ref 4.20–5.90)
RDW: 13 % (ref 11–15)
WBC: 9.01 10*3/uL (ref 3.10–9.50)

## 2019-05-28 LAB — ECG 12-LEAD
Atrial Rate: 125 {beats}/min
P Axis: 58 degrees
P-R Interval: 146 ms
Q-T Interval: 316 ms
QRS Duration: 86 ms
QTC Calculation (Bezet): 456 ms
R Axis: -45 degrees
T Axis: 67 degrees
Ventricular Rate: 125 {beats}/min

## 2019-05-28 LAB — COMPREHENSIVE METABOLIC PANEL
ALT: 18 U/L (ref 0–55)
AST (SGOT): 22 U/L (ref 5–34)
Albumin/Globulin Ratio: 1.4 (ref 0.9–2.2)
Albumin: 4.1 g/dL (ref 3.5–5.0)
Alkaline Phosphatase: 70 U/L (ref 38–106)
Anion Gap: 14 (ref 5.0–15.0)
BUN: 15 mg/dL (ref 9–28)
Bilirubin, Total: 0.4 mg/dL (ref 0.2–1.2)
CO2: 21 mEq/L — ABNORMAL LOW (ref 22–29)
Calcium: 8.8 mg/dL (ref 8.5–10.5)
Chloride: 101 mEq/L (ref 100–111)
Creatinine: 0.8 mg/dL (ref 0.7–1.3)
Globulin: 2.9 g/dL (ref 2.0–3.6)
Glucose: 112 mg/dL — ABNORMAL HIGH (ref 70–100)
Potassium: 4 mEq/L (ref 3.5–5.1)
Protein, Total: 7 g/dL (ref 6.0–8.3)
Sodium: 136 mEq/L (ref 136–145)

## 2019-05-28 LAB — LACTIC ACID, PLASMA: Lactic Acid: 0.9 mmol/L (ref 0.2–2.0)

## 2019-05-28 LAB — GFR: EGFR: >60

## 2019-05-28 LAB — TROPONIN I: Troponin I: 0.01 ng/mL (ref 0.00–0.05)

## 2019-05-28 LAB — IHS D-DIMER: D-Dimer: 0.47 ug/mL FEU (ref 0.00–0.60)

## 2019-05-28 MED ORDER — ONDANSETRON 4 MG PO TBDP
4.00 mg | ORAL_TABLET | Freq: Once | ORAL | Status: DC
Start: 2019-05-28 — End: 2019-05-29

## 2019-05-28 MED ORDER — SODIUM CHLORIDE 0.9 % IV BOLUS
1000.00 mL | Freq: Once | INTRAVENOUS | Status: AC
Start: 2019-05-28 — End: 2019-05-29
  Administered 2019-05-28: 22:00:00 1000 mL via INTRAVENOUS

## 2019-05-28 MED ORDER — NITROGLYCERIN 0.4 MG SL SUBL
0.4000 mg | SUBLINGUAL_TABLET | SUBLINGUAL | Status: DC | PRN
Start: 2019-05-28 — End: 2019-05-28
  Filled 2019-05-28: qty 1

## 2019-05-28 MED ORDER — ACETAMINOPHEN 500 MG PO TABS
1000.00 mg | ORAL_TABLET | Freq: Once | ORAL | Status: AC
Start: 2019-05-28 — End: 2019-05-28
  Administered 2019-05-28: 22:00:00 1000 mg via ORAL
  Filled 2019-05-28: qty 2

## 2019-05-28 NOTE — EDIE (Signed)
COLLECTIVE?NOTIFICATION?05/28/2019 19:59?Audry, Pecina R?MRN: [id number]    [facility]'s patient encounter information:   QMV:[id number]  Account [id number]  Billing Account [id number]      Criteria Met      5 ED Visits in 12 Months    Security and Safety  No recent Security Events currently on file    ED Care Guidelines  There are currently no ED Care Guidelines for this patient. Please check your facility's medical records system.        Prescription Monitoring Program  000??- Narcotic Use Score  000??- Sedative Use Score  000??- Stimulant Use Score  000??- Overdose Risk Score  - All Scores range from 000-999 with 75% of the population scoring < 200 and on 1% scoring above 650  - The last digit of the narcotic, sedative, and stimulant score indicates the number of active prescriptions of that type  - Higher Use scores correlate with increased prescribers, pharmacies, mg equiv, and overlapping prescriptions  - Higher Overdose Risk Scores correlate with increased risk of unintentional overdose death   Concerning or unexpectedly high scores should prompt a review of the PMP record; this does not constitute checking PMP for prescribing purposes.      E.D. Visit Count (12 mo.)  Facility Visits   [place] [facility] 2   [place] [facility] 5   Total 7   Note: Visits indicate total known visits.     Recent Emergency Department Visit Summary  Date Facility [facility] Type Diagnoses or Chief Complaint   May 28, 2019 [place] [facility] [place] [name] [place] [facility]      chest pain      May 12, 2019 [place] [name] [place] [facility]      chest pain      Chest pain, unspecified      Mar 27, 2019 [place] [facility] [place] [name] [place] [facility]      CP      Chest Pain      Chest pain, unspecified      Feb 11, 2019 [place] [facility] [place] [name] [place] [facility]      Chet tightness      Chest Pain      Other chronic pain      Other chest pain      Malingerer [conscious simulation]       Feb 03, 2019 [place] [facility] [place] [name] [place] [facility]      Chest pain      Heart palpitation      Shortness of Breath      Other chest pain      Nicotine dependence, unspecified, uncomplicated      Other psychoactive substance abuse, uncomplicated      Jan 04, 2019 [place] [facility] [place] [name] [place] [facility]      syncopal episode      Syncope      Altered mental status, unspecified      Jan 02, 2019 [place] [facility] [place] [name] [place] [facility]      abd pain      Abdominal Pain      Chest pain, unspecified      Other psychoactive substance abuse, uncomplicated      Generalized abdominal pain      Brief psychotic disorder          Recent Inpatient Visit Summary  No recorded inpatient visits.     Care Team  There is not a  care team on record at this time.   Collective Portal  This patient has registered at the [facility] Emergency Department   For more information visit: [email or web address]     PLEASE NOTE:     1.   Any care recommendations and other clinical information are provided as guidelines or for historical purposes only, and providers should exercise their own clinical judgment when providing care.    2.   You may only use this information for purposes of treatment, payment or health care operations activities, and subject to the limitations of applicable Collective Policies.    3.   You should consult directly with the organization that provided a care guideline or other clinical history with any questions about additional information or accuracy or completeness of information provided.    ? 2021 [place], [employer] - [email or web address]

## 2019-05-28 NOTE — ED Notes (Signed)
Pt still in bathroom.  This RN and Glade Lloyd, EMT checked in on pt and he stated he is OK.

## 2019-05-28 NOTE — ED Notes (Signed)
Pt is being uncooperative in triage and with assessments.  Refusing IV insertion and blood cultures.  Was able to obtain one lavender top vial and lost IV access.  Belenda Cruise, RN trying to obtain IV access and labs/cultures.  Provider aware that pt has tripped sepsis with elevated temp and tachycardia.  Will continue attempts to assess and obtain ordered labwork/IV access.

## 2019-05-28 NOTE — Discharge Instructions (Addendum)
We offered the Covid test which you have refused.  Please self-quarantine for 14 days. Do not go to work until you receive your COVID-19 test result - if positive, you must continue to self-quarantine at home for 14 days. Keep 6 feet apart from other people at home. Do not share drinks or utensils. If possible, sleep in a separate room from other family members. Increase fluid intake and tylenol 1000mg  every 6 hours as needed for any pain or fever. Follow up with your primary care doctor 1 week for a virtual appointment. If symptoms worsen, including worsening shortness of breath, chest pain, lethargy, persistent fever/chills even with tylenol, come back to the ED.     Please follow-up with cardiology per virtual appointment within 2 days.  Follow-up in the office after 14 days of quarantine.  If chest pain occurs again or symptoms worsen, come back to the emergency department.

## 2019-05-28 NOTE — ED Notes (Signed)
Pt refusing COVID swab.  Will advise provider.    Pt has not provided urine.

## 2019-05-28 NOTE — ED Provider Notes (Signed)
EMERGENCY DEPARTMENT NOTE     Patient initially seen and examined at   ED PHYSICIAN ASSIGNED     None         ED MIDLEVEL (APP) ASSIGNED     Date/Time Event User Comments    05/28/19 2001 PA/NP Provider Assigned Murrell Converse, Alex Mcmanigal Sheldon Silvan, FNP assigned as Nurse Practitioner          HISTORY OF PRESENT ILLNESS   Historian:Patient  Translator Used: no    Chief Complaint: Chest Pain      54 y.o. male with a PMH of GERD, heroin and cocaine abuse presents with non radiating constant substernal chest pressure that began at 1950 while watching TV. Mild SOB with pain. Denies FC, URI sxs, numbness/tingling, NV, diaphoresis, or other symptoms. No known sick contacts. Admits to smoking a joint and drinking a glass of beer earlier today. However, denies heroin or cocaine abuse.     1. Location of symptoms: chest  2. Onset of symptoms: today 1950  3. What was patient doing when symptoms started (Context): see above  4. Severity: moderate  5. Timing: constant  6. Activities that worsen symptoms: palpation  7. Activities that improve symptoms: none  8. Quality: pressure  9. Radiation of symptoms: no  10. Associated signs and Symptoms: see above  11. Are symptoms worsening? yes  MEDICAL HISTORY     Past Medical History:  Past Medical History:   Diagnosis Date    Depression     Gallstones     Gastritis     Heart murmur     Renal calculus        Past Surgical History:  History reviewed. No pertinent surgical history.    Social History:  Social History     Socioeconomic History    Marital status: Single     Spouse name: Not on file    Number of children: Not on file    Years of education: Not on file    Highest education level: Not on file   Occupational History    Not on file   Tobacco Use    Smoking status: Current Every Day Smoker     Packs/day: 1.00     Years: 0.50     Pack years: 0.50     Types: Cigarettes    Smokeless tobacco: Never Used   Substance and Sexual Activity    Alcohol use: Not Currently    Drug use: Yes      Types: Marijuana, Cocaine     Comment: heroin     Sexual activity: Not on file   Other Topics Concern    Not on file   Social History Narrative    Not on file     Social Determinants of Health     Financial Resource Strain:     Difficulty of Paying Living Expenses:    Food Insecurity:     Worried About Programme researcher, broadcasting/film/video in the Last Year:     Barista in the Last Year:    Transportation Needs:     Freight forwarder (Medical):     Lack of Transportation (Non-Medical):    Physical Activity:     Days of Exercise per Week:     Minutes of Exercise per Session:    Stress:     Feeling of Stress :    Social Connections:     Frequency of Communication with Friends and Family:     Frequency of  Social Gatherings with Friends and Family:     Attends Religious Services:     Active Member of Clubs or Organizations:     Attends Engineer, structural:     Marital Status:    Intimate Partner Violence:     Fear of Current or Ex-Partner:     Emotionally Abused:     Physically Abused:     Sexually Abused:        Family History:  Family History   Problem Relation Age of Onset    Diabetes Mother     Hypertension Mother        Outpatient Medication:  There are no discharge medications for this patient.        REVIEW OF SYSTEMS   Review of Systems   Constitutional: Negative for chills, diaphoresis and fever.   Respiratory: Positive for shortness of breath. Negative for cough.    Cardiovascular: Positive for chest pain.   Gastrointestinal: Negative for abdominal pain, diarrhea, nausea and vomiting.   Neurological: Negative for dizziness, light-headedness, numbness and headaches.   All other systems reviewed and are negative.      PHYSICAL EXAM     ED Triage Vitals [05/28/19 2003]   Enc Vitals Group      BP 168/89      Heart Rate (!) 126      Resp Rate 18      Temp 100.4 F (38 C)      Temp Source Oral      SpO2 98 %      Weight 83.5 kg      Height 1.727 m      Head Circumference       Peak  Flow       Pain Score 6      Pain Loc       Pain Edu?       Excl. in GC?      Vitals:    05/28/19 2300 05/28/19 2330 05/28/19 2340 05/29/19 0101   BP: 136/78 140/80  145/81   Pulse: 68 61  71   Resp: 21 18  16    Temp:   98.4 F (36.9 C) 98.1 F (36.7 C)   TempSrc:    Oral   SpO2: 98% 99%  99%   Weight:       Height:         Physical Exam  Vitals and nursing note reviewed.   Constitutional:       General: He is in acute distress.      Appearance: He is well-developed. He is not ill-appearing, toxic-appearing or diaphoretic.   HENT:      Head: Normocephalic and atraumatic.   Eyes:      Extraocular Movements: Extraocular movements intact.      Conjunctiva/sclera: Conjunctivae normal.      Pupils: Pupils are equal, round, and reactive to light.   Cardiovascular:      Rate and Rhythm: Regular rhythm. Tachycardia present.      Heart sounds: Normal heart sounds.   Pulmonary:      Effort: Pulmonary effort is normal.      Breath sounds: Normal breath sounds.   Chest:      Chest wall: Tenderness present.   Abdominal:      General: Bowel sounds are normal.      Palpations: Abdomen is soft.      Tenderness: There is no abdominal tenderness. There is no right CVA tenderness or left CVA tenderness.  Musculoskeletal:         General: Normal range of motion.      Cervical back: Normal range of motion and neck supple.   Skin:     General: Skin is warm and dry.   Neurological:      General: No focal deficit present.      Mental Status: He is alert and oriented to person, place, and time.   Psychiatric:         Mood and Affect: Mood normal.         Behavior: Behavior normal.         Thought Content: Thought content normal.         Judgment: Judgment normal.           MEDICAL DECISION MAKING     DISCUSSION        PPE: N95, procedural mask, goggles, face shield, gown, gloves  Contact and droplet isolation precautions placed.    Chest pain reproducible on exam. Patient not diaphoretic, but tachycardic with temp of 100.27F in ED. No  hypoxia or respiratory distress. Patient irritable and refused PIV and pain medications.     EKG ST w left axis deviation and possible left atrial enlargement  Labs grossly unremarkable  CXR negative for acute pathology, reviewed by me    Nitroglycerin cancelled due to patient taking OTC version of Viagra approximately at 1700. Reports this is his third or fourth time taking the medication and has never had CP afterwards.     Patient refusing COVID test and all medications except for tylenol and fluids.     Patient turned over to Dr. Cleotis Nipper pending UDS, repeat troponin, and disposition.      DDx: COVID-19 virus, ACS, MI, PE, MSK pain       Heart Score      Value   History  0   EKG  1   Risk Factors  0   Total (with age)  2   Onset of pain (time of START of last episode of chest pain)?  0-3 hrs ago           The patient is NOT septic.  All labs and vital signs from the current visit have been   reviewed and any abnormality that is present is not due to sepsis.    Vital Signs: Reviewed the patients vital signs.   Nursing Notes: Reviewed and utilized available nursing notes.  Medical Records Reviewed: Reviewed available past medical records. Notes, labs, and imaging reviewed.  Counseling: The emergency provider has spoken with the patient and discussed todays findings, in addition to providing specific details for the plan of care.  Questions are answered and there is agreement with the plan.        CARDIAC STUDIES    The following cardiac studies were independently interpreted by the Emergency Medicine Physician.  For full cardiac study results please see chart.    EKG Interpretation:  Signed and interpreted by me Murrell Converse)  Time Interpreted: 2005  Comparison: SB w SA - 05/12/19  Rate: 121  Rhythm: NSR  Axis: left axis devation  Intervals: normal  Blocks: none  ST segments: no elevations or depressions  Interpretation: Nonspecific  EKG    EMERGENCY IMAGING STUDIES    The following imagine studies were independently  interpreted by me (emergency physician):    Radiology:  Interpreted by me Murrell Converse)  Study: Chest Xray   Results: No infiltrate. No pneumothorax. No hemothorax. No cardiomegaly. No CHF.  Impression: No acute intrathoracic abnormality.    RADIOLOGY IMAGING STUDIES      Chest AP Portable   Final Result       No acute abnormality.      Darnelle Maffucci, MD    05/28/2019 8:37 PM            PULSE OXIMETRY    Oxygen Saturation by Pulse Oximetry: 99%  Interventions: none  Interpretation:  Stable on RA    EMERGENCY DEPT. MEDICATIONS      ED Medication Orders (From admission, onward)    Start Ordered     Status Ordering Provider    05/28/19 2118 05/28/19 2118    Once     Route: Oral  Ordered Dose: 4 mg     Discontinued Treasa Bradshaw D    05/28/19 2046 05/28/19 2046    Every 5 min PRN     Route: Sublingual  Ordered Dose: 0.4 mg     Discontinued Kamir Selover D    05/28/19 2016 05/28/19 2015  acetaminophen (TYLENOL) tablet 1,000 mg  Once     Route: Oral  Ordered Dose: 1,000 mg     Last MAR action: Given Kamya Watling D    05/28/19 2016 05/28/19 2015  sodium chloride 0.9 % bolus 1,000 mL  Once     Route: Intravenous  Ordered Dose: 1,000 mL     Last MAR action: Stopped Azuree Minish D          LABORATORY RESULTS    Ordered and independently interpreted AVAILABLE laboratory tests. Please see results section in chart for full details.  Results for orders placed or performed during the hospital encounter of 05/28/19   Comprehensive metabolic panel   Result Value Ref Range    Glucose 112 (H) 70 - 100 mg/dL    BUN 15 9 - 28 mg/dL    Creatinine 0.8 0.7 - 1.3 mg/dL    Sodium 161 096 - 045 mEq/L    Potassium 4.0 3.5 - 5.1 mEq/L    Chloride 101 100 - 111 mEq/L    CO2 21 (L) 22 - 29 mEq/L    Calcium 8.8 8.5 - 10.5 mg/dL    Protein, Total 7.0 6.0 - 8.3 g/dL    Albumin 4.1 3.5 - 5.0 g/dL    AST (SGOT) 22 5 - 34 U/L    ALT 18 0 - 55 U/L    Alkaline Phosphatase 70 38 - 106 U/L    Bilirubin, Total 0.4 0.2 - 1.2 mg/dL    Globulin 2.9 2.0 - 3.6 g/dL     Albumin/Globulin Ratio 1.4 0.9 - 2.2    Anion Gap 14.0 5.0 - 15.0   CBC and differential   Result Value Ref Range    WBC 9.01 3.10 - 9.50 x10 3/uL    Hgb 12.3 (L) 12.5 - 17.1 g/dL    Hematocrit 40.9 (L) 37.6 - 49.6 %    Platelets 188 142 - 346 x10 3/uL    RBC 4.01 (L) 4.20 - 5.90 x10 6/uL    MCV 90.3 78.0 - 96.0 fL    MCH 30.7 25.1 - 33.5 pg    MCHC 34.0 31.5 - 35.8 g/dL    RDW 13 11 - 15 %    MPV 11.7 8.9 - 12.5 fL    Neutrophils 62.4 None %    Lymphocytes Automated 24.0 None %    Monocytes 10.9 None %    Eosinophils Automated 2.1 None %    Basophils  Automated 0.4 None %    Immature Granulocytes 0.2 None %    Nucleated RBC 0.0 0.0 - 0.0 /100 WBC    Neutrophils Absolute 5.62 1.10 - 6.33 x10 3/uL    Lymphocytes Absolute Automated 2.16 0.42 - 3.22 x10 3/uL    Monocytes Absolute Automated 0.98 (H) 0.21 - 0.85 x10 3/uL    Eosinophils Absolute Automated 0.19 0.00 - 0.44 x10 3/uL    Basophils Absolute Automated 0.04 0.00 - 0.08 x10 3/uL    Immature Granulocytes Absolute 0.02 0.00 - 0.07 x10 3/uL    Absolute NRBC 0.00 0.00 - 0.00 x10 3/uL   Troponin I   Result Value Ref Range    Troponin I 0.01 0.00 - 0.05 ng/mL   GFR   Result Value Ref Range    EGFR >60.0    Lactic Acid   Result Value Ref Range    Lactic Acid 0.9 0.2 - 2.0 mmol/L   D-Dimer   Result Value Ref Range    D-Dimer 0.47 0.00 - 0.60 ug/mL FEU   Troponin I   Result Value Ref Range    Troponin I <0.01 0.00 - 0.05 ng/mL   ECG 12 Lead   Result Value Ref Range    Ventricular Rate 121 BPM    Atrial Rate 121 BPM    P-R Interval 152 ms    QRS Duration 86 ms    Q-T Interval 318 ms    QTC Calculation (Bezet) 451 ms    P Axis 55 degrees    R Axis -42 degrees    T Axis 59 degrees   ECG 12 lead   Result Value Ref Range    Ventricular Rate 125 BPM    Atrial Rate 125 BPM    P-R Interval 146 ms    QRS Duration 86 ms    Q-T Interval 316 ms    QTC Calculation (Bezet) 456 ms    P Axis 58 degrees    R Axis -45 degrees    T Axis 67 degrees       CRITICAL CARE/PROCEDURES     Procedures    DIAGNOSIS      Diagnosis:  Final diagnoses:   Suspected COVID-19 virus infection   Chest pain, unspecified type       Disposition:  ED Disposition     ED Disposition Condition Date/Time Comment    Discharge  Fri May 29, 2019 12:44 AM [name] discharge to home/self care.    Condition at disposition: Stable            Prescriptions:  There are no discharge medications for this patient.      This note was generated by the Epic EMR system/ Dragon speech recognition and may contain inherent errors or omissions not intended by the user. Grammatical errors, random word insertions, deletions and pronoun errors  are occasional consequences of this technology due to software limitations. Not all errors are caught or corrected. If there are questions or concerns about the content of this note or information contained within the body of this dictation they should be addressed directly with the author for clarification.     Sheldon Silvan, FNP  05/30/19 [phone]

## 2019-05-28 NOTE — ED Notes (Signed)
Pt refusing blood cultures at this time. Pt reports "I do not want you putting anything inside me". Delorise Shiner NP made aware

## 2019-05-29 LAB — TROPONIN I: Troponin I: <0.01 ng/mL (ref 0.00–0.05)

## 2019-05-29 NOTE — ED Provider Notes (Signed)
Repeat trop negative      D/c home     Norberto Sorenson, [place]  05/29/19 [phone]

## 2019-05-30 ENCOUNTER — Emergency Department: Payer: Medicaid HMO

## 2019-05-30 ENCOUNTER — Emergency Department
Admission: EM | Admit: 2019-05-30 | Discharge: 2019-05-30 | Disposition: A | Discharge status: Home / Self Care | Payer: Medicaid HMO | Attending: Emergency Medicine | Admitting: Emergency Medicine

## 2019-05-30 DIAGNOSIS — Z20822 Contact with and (suspected) exposure to covid-19: Secondary | ICD-10-CM | POA: Insufficient documentation

## 2019-05-30 DIAGNOSIS — R0789 Other chest pain: Secondary | ICD-10-CM

## 2019-05-30 DIAGNOSIS — F17210 Nicotine dependence, cigarettes, uncomplicated: Secondary | ICD-10-CM | POA: Insufficient documentation

## 2019-05-30 DIAGNOSIS — R079 Chest pain, unspecified: Secondary | ICD-10-CM

## 2019-05-30 HISTORY — DX: Opioid abuse, uncomplicated: F11.10

## 2019-05-30 LAB — TROPONIN I: Troponin I: 0.01 ng/mL (ref 0.00–0.05)

## 2019-05-30 LAB — MAN DIFF ONLY
Band Neutrophils Absolute: 0 10*3/uL (ref 0.00–1.00)
Band Neutrophils: 0 %
Basophils Absolute Manual: 0 10*3/uL (ref 0.00–0.08)
Basophils Manual: 0 %
Eosinophils Absolute Manual: 0.21 10*3/uL (ref 0.00–0.44)
Eosinophils Manual: 3 %
Lymphocytes Absolute Manual: 1.8 10*3/uL (ref 0.42–3.22)
Lymphocytes Manual: 26 %
Monocytes Absolute: 0.55 10*3/uL (ref 0.21–0.85)
Monocytes Manual: 8 %
Neutrophils Absolute Manual: 4.35 10*3/uL (ref 1.10–6.33)
Segmented Neutrophils: 63 %

## 2019-05-30 LAB — COMPREHENSIVE METABOLIC PANEL
ALT: 16 U/L (ref 0–55)
AST (SGOT): 20 U/L (ref 5–34)
Albumin/Globulin Ratio: 1.4 (ref 0.9–2.2)
Albumin: 4.1 g/dL (ref 3.5–5.0)
Alkaline Phosphatase: 74 U/L (ref 38–106)
Anion Gap: 9 (ref 5.0–15.0)
BUN: 8 mg/dL — ABNORMAL LOW (ref 9.0–28.0)
Bilirubin, Total: 0.6 mg/dL (ref 0.2–1.2)
CO2: 26 mEq/L (ref 22–29)
Calcium: 9.3 mg/dL (ref 8.5–10.5)
Chloride: 101 mEq/L (ref 100–111)
Creatinine: 0.8 mg/dL (ref 0.7–1.3)
Globulin: 2.9 g/dL (ref 2.0–3.6)
Glucose: 99 mg/dL (ref 70–100)
Potassium: 4 mEq/L (ref 3.5–5.1)
Protein, Total: 7 g/dL (ref 6.0–8.3)
Sodium: 136 mEq/L (ref 136–145)

## 2019-05-30 LAB — CBC AND DIFFERENTIAL
Absolute NRBC: 0 10*3/uL (ref 0.00–0.00)
Hematocrit: 34.1 % — ABNORMAL LOW (ref 37.6–49.6)
Hgb: 11.5 g/dL — ABNORMAL LOW (ref 12.5–17.1)
MCH: 30.6 pg (ref 25.1–33.5)
MCHC: 33.7 g/dL (ref 31.5–35.8)
MCV: 90.7 fL (ref 78.0–96.0)
MPV: 10.5 fL (ref 8.9–12.5)
Nucleated RBC: 0 /100 WBC (ref 0.0–0.0)
Platelets: 187 10*3/uL (ref 142–346)
RBC: 3.76 10*6/uL — ABNORMAL LOW (ref 4.20–5.90)
RDW: 13 % (ref 11–15)
WBC: 6.91 10*3/uL (ref 3.10–9.50)

## 2019-05-30 LAB — CELL MORPHOLOGY: Cell Morphology: NORMAL

## 2019-05-30 LAB — B-TYPE NATRIURETIC PEPTIDE: B-Natriuretic Peptide: <10 pg/mL (ref 0–100)

## 2019-05-30 LAB — COVID-19 (SARS-COV-2): SARS CoV 2 Overall Result: NEGATIVE

## 2019-05-30 LAB — HEMOLYSIS INDEX: Hemolysis Index: 4 (ref 0–18)

## 2019-05-30 LAB — GFR: EGFR: >60

## 2019-05-30 NOTE — ED Notes (Signed)
Pt refused chest x-ray

## 2019-05-30 NOTE — ED Triage Notes (Signed)
Pt is here for intermittent chest pain. Pt said he used heroin this week but wouldn't specify.

## 2019-05-30 NOTE — ED Notes (Signed)
Patient did not allow writer/Delicia Berens rn to perform the covid test properly. He only allowed the swab to go in the tip of his nostril. Told Dr. Maisie Fus

## 2019-05-30 NOTE — Discharge Instructions (Signed)
Chest Pain of Unclear Etiology     You have been seen for chest pain. The cause of your pain is not yet known.     Your doctor has learned about your medical history, examined you, and checked any tests that were done. Still, it is not clear why you are having pain. The doctor thinks there is only a small chance that your pain is caused by a health problem that could lead to serious harm or death. Later, your primary care doctor might do more tests or check you again.     Sometimes chest pain is caused by a health problem that can lead to death, like a:  · Heart attack.  · Injury to the large blood vessel in your body (aorta).  · Blood clot in the lung.  · Collapsed lung.      It is not likely that your pain is caused by a health problem that could lead to death if:   · Your chest pain lasts only a few seconds at a time  · You are not short of breath, nauseated (sick to your stomach), sweaty, or lightheaded  · Your pain gets worse when you twist or bend  · Your pain improves with exercise or hard work.     Chest pain is serious. It is very important that you follow up with your regular doctor.     Return here or go to the nearest Emergency Department immediately if:  · Your pain makes you short of breath, nauseated (sick to your stomach), or sweaty.  · Your pain gets worse when you walk, go up stairs, or exert yourself.  · You feel weak, lightheaded, or faint.  · It hurts to breathe.  · Your leg swells.  · Your pain or symptoms get worse   · You have new symptoms or concerns.     If you can't follow up with your doctor, or if at any time you feel you need to be rechecked or seen again, come back here or go to the nearest emergency department.

## 2019-05-30 NOTE — ED Provider Notes (Signed)
EMERGENCY DEPARTMENT HISTORY AND PHYSICAL EXAM     None        Date: 05/30/2019  Patient Name: [name] [name]    History of Presenting Illness and Plan     Chief Complaint   Patient presents with    Chest Pain       History Provided By: Patient    HPI:   54 year old patient presenting with chest pain that started just prior to arrival.  Patient states that he was at rest when he felt a cramping sensation in the left chest.  Pain has since resolved.  He stated that he got slightly nauseated.  No diaphoresis.  No vomiting.  Patient has had similar pains in the past.  Review of the medical history history of cocaine and heroin abuse the patient denies today.  No abdominal pain.  Patient is concerned that this could be Covid.  He states that he had a recent contact with a friend who took a trip and they are now in quarantine.  No cough or congestion.  No runny nose no sore throat.  No fevers or chills.   Associated symptoms and pertinent negative listed in ROS.    PCP: Pcp, None, MD  SPECIALISTS:    No current facility-administered medications for this encounter.     No current outpatient medications on file.       Past History     Past Medical History:  Past Medical History:   Diagnosis Date    Depression     Gallstones     Gastritis     Heart murmur     Heroin abuse     Renal calculus        Past Surgical History:  History reviewed. No pertinent surgical history.    Family History:  Family History   Problem Relation Age of Onset    Diabetes Mother     Hypertension Mother        Social History:  Social History     Tobacco Use    Smoking status: Current Every Day Smoker     Packs/day: 0.25     Years: 0.50     Pack years: 0.12     Types: Cigarettes    Smokeless tobacco: Never Used   Substance Use Topics    Alcohol use: Yes     Comment: rarely    Drug use: Yes     Types: Marijuana, Cocaine     Comment: heroin        Allergies:  Allergies   Allergen Reactions    Gluten      Pt states "white bread"    Percocet  [Oxycodone-Acetaminophen]     Tylenol [Acetaminophen] Hives     Tyelnol#3, hives and throw up       Review of Systems     Review of Systems   Constitutional: Negative for chills and fever.   HENT: Negative for congestion.    Eyes: Negative for visual disturbance.   Respiratory: Negative for cough.    Cardiovascular: Positive for chest pain.   Gastrointestinal: Positive for nausea. Negative for abdominal pain and vomiting.   Genitourinary: Negative for dysuria.   Musculoskeletal: Negative for myalgias.   Neurological: Negative for dizziness, numbness and headaches.   Psychiatric/Behavioral: Negative for confusion.       Physical Exam   BP 161/81 Comment: Simultaneous filing. User may be unaware of other data.   Pulse 81 Comment: Simultaneous filing. User may be unaware of other  data.   Temp 98.7 F (37.1 C) (Oral)    Resp 19 Comment: Simultaneous filing. User may be unaware of other data.   Ht 5\' 8"  (1.727 m)    Wt 79.8 kg    SpO2 99% Comment: Simultaneous filing. User may be unaware of other data.   BMI 26.76 kg/m     Physical Exam    Diagnostic Study Results     Labs -     Results     Procedure Component Value Units Date/Time    COVID-19 (SARS-CoV-2) ([place] Rapid test)- Required by Extended care facility discharge within 24 hours [[id number]] Collected: 05/30/19 1658    Specimen: Nasopharyngeal Swab from Nasopharynx Updated: 05/30/19 1748     Purpose of COVID testing Screening     SARS-CoV-2 Specimen Source Nasopharyngeal     SARS CoV 2 Overall Result Negative    Narrative:      o Collect and clearly label specimen type:  o Upper respiratory specimen: One Nasopharyngeal Dry Swab NO  Transport Media.  o Hand deliver to laboratory ASAP  Indication for testing->Emergency department patients being  discharged with potential for monoclonal antibody therapy  Testing as required by extended care facility?->Yes    Cell MorpHology [[id number]] Collected: 05/30/19 1634     Updated: 05/30/19 1722     Cell Morphology Normal     Manual Differential [[id number]] Collected: 05/30/19 1634     Updated: 05/30/19 1722     Segmented Neutrophils 63 %      Band Neutrophils 0 %      Lymphocytes Manual 26 %      Monocytes Manual 8 %      Eosinophils Manual 3 %      Basophils Manual 0 %      Neutrophils Absolute Manual 4.35 x10 3/uL      Band Neutrophils Absolute 0.00 x10 3/uL      Lymphocytes Absolute Manual 1.80 x10 3/uL      Monocytes Absolute 0.55 x10 3/uL      Eosinophils Absolute Manual 0.21 x10 3/uL      Basophils Absolute Manual 0.00 x10 3/uL     CBC and differential [[id number]]  (Abnormal) Collected: 05/30/19 1634    Specimen: Blood Updated: 05/30/19 1722     WBC 6.91 x10 3/uL      Hgb 11.5 g/dL      Hematocrit 62.9 %      Platelets 187 x10 3/uL      RBC 3.76 x10 6/uL      MCV 90.7 fL      MCH 30.6 pg      MCHC 33.7 g/dL      RDW 13 %      MPV 10.5 fL      Nucleated RBC 0.0 /100 WBC      Absolute NRBC 0.00 x10 3/uL     Troponin I [[id number]] Collected: 05/30/19 1634    Specimen: Blood Updated: 05/30/19 1706     Troponin I 0.01 ng/mL     B-type Natriuretic Peptide [[id number]] Collected: 05/30/19 1634    Specimen: Blood Updated: 05/30/19 1706     B-Natriuretic Peptide <10 pg/mL     GFR [[id number]] Collected: 05/30/19 1634     Updated: 05/30/19 1658     EGFR >60.0    Comprehensive metabolic panel [[id number]]  (Abnormal) Collected: 05/30/19 1634    Specimen: Blood Updated: 05/30/19 1658     Glucose 99 mg/dL      BUN 8.0 mg/dL  Creatinine 0.8 mg/dL      Sodium 161 mEq/L      Potassium 4.0 mEq/L      Chloride 101 mEq/L      CO2 26 mEq/L      Calcium 9.3 mg/dL      Protein, Total 7.0 g/dL      Albumin 4.1 g/dL      AST (SGOT) 20 U/L      ALT 16 U/L      Alkaline Phosphatase 74 U/L      Bilirubin, Total 0.6 mg/dL      Globulin 2.9 g/dL      Albumin/Globulin Ratio 1.4     Anion Gap 9.0    Hemolysis index [[id number]] Collected: 05/30/19 1634     Updated: 05/30/19 1658     Hemolysis Index 4    COVID-19 (SARS-COV-2) and Influenza [facility] Rapid)  [[id number]] Collected: 05/30/19 1658    Specimen: Nasopharyngeal Updated: 05/30/19 1658    Narrative:      o Collect and clearly label specimen type:  o PREFERRED-Upper respiratory specimen: One Nasopharyngeal  Swab in Transport Media.  o Hand deliver to laboratory ASAP          Radiologic Studies -   Radiology Results (24 Hour)     ** No results found for the last 24 hours. **      .    Medical Decision Making   I am the first provider for this patient.    I reviewed the vital signs, available nursing notes, past medical history, past surgical history, family history and social history.    Vital Signs-Reviewed the patient's vital signs.     Patient Vitals for the past 12 hrs:   BP Pulse Resp   05/30/19 1827 161/81 81 19   05/30/19 1700 129/75 72 20       Pulse Oximetry Analysis - Normal         ED Course/ Provider Note/ MDM: :       ED Course as of May 30 1710   Sat May 30, 2019   1640 EKG: 15:55 sinus rhythm, rate 84, normal PR, normal QRS, no ectopy, no STEMI.  Previous EKG 28 May 2019.  Previously tachycardic no significant morphology change    [ST]   1637 54 year old patient presenting with chest pain that started just prior to arrival.  Patient states that he was at rest when he felt a cramping sensation in the left chest.  Pain has since resolved.  He stated that he got slightly nauseated.  No diaphoresis.  No vomiting.  Patient has had similar pains in the past.  Review of the medical history history of cocaine and heroin abuse the patient denies today.  No abdominal pain.  Patient is concerned that this could be Covid.  He states that he had a recent contact with a friend who took a trip and they are now in quarantine.  No cough or congestion.  No runny nose no sore throat.  No fevers or chills.    [ST]   1642 Nontoxic-appearing.  No acute distress.  Low suspicion for ACS, PE, aortic dissection or other acute cardiopulmonary emergency.  Will check troponin.  Patient requesting a Covid test.  He was seen on  1 April for suspicion of Covid.    [ST]      ED Course User Index  [ST] Cherlyn Roberts, MD       54 year old patient presenting with chest pain.  Pain  is atypical in nature.  Low suspicion for ACS.  Low suspicion for PE, dissection or other acute cardiopulmonary emergency.  EKG nondiagnostic for acute ischemic changes.  Troponin negative.  No acute electrolyte abnormalities.  Covid test negative.  Patient safe for discharge.  Patient told to follow-up with cardiology.  No further testing indicated at this time.        Dr. Audley Hose is the primary emergency doctor of record.    Diagnosis     Clinical Impression:   1. Atypical chest pain        Treatment Plan:   ED Disposition     ED Disposition Condition Date/Time Comment    Discharge  Sat May 30, 2019  6:21 PM [name] discharge to home/self care.    Condition at disposition: Stable            _______________________________      This note was generated by the Epic EMR system/ Dragon speech recognition and may contain inherent errors or omissions not intended by the user. Grammatical errors, random word insertions, deletions and pronoun errors  are occasional consequences of this technology due to software limitations. Not all errors are caught or corrected. If there are questions or concerns about the content of this note or information contained within the body of this dictation they should be addressed directly with the author for clarification.  _______________________________       Cherlyn Roberts, MD  05/31/19 [phone]

## 2019-05-30 NOTE — EDIE (Signed)
COLLECTIVE?NOTIFICATION?05/30/2019 15:50?[name] [name], [name] [name]?MRN: [id number]    Criteria Met      5 ED Visits in 12 Months    Security and Safety  No recent Security Events currently on file    ED Care Guidelines  There are currently no ED Care Guidelines for this patient. Please check your facility's medical records system.        Prescription Monitoring Program  000??- Narcotic Use Score  000??- Sedative Use Score  000??- Stimulant Use Score  000??- Overdose Risk Score  - All Scores range from 000-999 with 75% of the population scoring < 200 and on 1% scoring above 650  - The last digit of the narcotic, sedative, and stimulant score indicates the number of active prescriptions of that type  - Higher Use scores correlate with increased prescribers, pharmacies, mg equiv, and overlapping prescriptions  - Higher Overdose Risk Scores correlate with increased risk of unintentional overdose death   Concerning or unexpectedly high scores should prompt a review of the PMP record; this does not constitute checking PMP for prescribing purposes.      E.D. Visit Count (12 mo.)  Facility Visits   [place] - [facility] 2   [facility] 6   Total 8   Note: Visits indicate total known visits.     Recent Emergency Department Visit Summary  Date Facility [facility] Type Diagnoses or Chief Complaint   May 30, 2019 [place] [facility] [place] [name] [place] [facility]      trouble breathing, chest pain      May 28, 2019 [place] - [name] [place] [facility]      chest pain      Chest pain, unspecified      Contact with and (suspected) exposure to covid-19      May 12, 2019 [place] [name] [place] [facility]      chest pain      Chest pain, unspecified      Mar 27, 2019 [place] [facility] [place] [name] [place] [facility]      CP      Chest Pain      Chest pain, unspecified      Feb 11, 2019 [place] [facility] [place] [name] [place] [facility]      Chet tightness      Chest Pain      Other chronic pain      Other chest pain      Malingerer  [conscious simulation]      Feb 03, 2019 [place] [facility] [place] [name] [place] [facility]      Chest pain      Heart palpitation      Shortness of Breath      Other chest pain      Nicotine dependence, unspecified, uncomplicated      Other psychoactive substance abuse, uncomplicated      Jan 04, 2019 [place] [facility] [place] [name] [place] [facility]      syncopal episode      Syncope      Altered mental status, unspecified      Jan 02, 2019  - [place] [name] [place] [facility]      abd pain      Abdominal Pain      Chest pain, unspecified      Other psychoactive substance abuse, uncomplicated      Generalized abdominal pain      Brief psychotic disorder  Recent Inpatient Visit Summary  No recorded inpatient visits.     Care Team  Provider Specialty Phone Fax Service Dates   SPARKS, Darrol Jump, NP-C Nurse Practitioner: Family   Current      Collective Portal  This patient has registered at the [facility] Emergency Department   For more information visit: [email or web address]     PLEASE NOTE:     1.   Any care recommendations and other clinical information are provided as guidelines or for historical purposes only, and providers should exercise their own clinical judgment when providing care.    2.   You may only use this information for purposes of treatment, payment or health care operations activities, and subject to the limitations of applicable Collective Policies.    3.   You should consult directly with the organization that provided a care guideline or other clinical history with any questions about additional information or accuracy or completeness of information provided.    ? 2021 [place], [employer] - [email or web address]

## 2019-05-31 LAB — ECG 12-LEAD
Atrial Rate: 84 {beats}/min
P Axis: 71 degrees
P-R Interval: 148 ms
Q-T Interval: 366 ms
QRS Duration: 82 ms
QTC Calculation (Bezet): 432 ms
R Axis: -30 degrees
T Axis: 6 degrees
Ventricular Rate: 84 {beats}/min

## 2019-06-01 LAB — ECG 12-LEAD
Atrial Rate: 121 {beats}/min
P Axis: 55 degrees
P-R Interval: 152 ms
Q-T Interval: 318 ms
QRS Duration: 86 ms
QTC Calculation (Bezet): 451 ms
R Axis: -42 degrees
T Axis: 59 degrees
Ventricular Rate: 121 {beats}/min

## 2019-06-03 LAB — CBC
Absolute NRBC: 0 10*3/uL (ref 0.00–0.00)
Hematocrit: 44.6 % (ref 37.6–49.6)
Hgb: 14.8 g/dL (ref 12.5–17.1)
MCH: 30.4 pg (ref 25.1–33.5)
MCHC: 33.2 g/dL (ref 31.5–35.8)
MCV: 91.6 fL (ref 78.0–96.0)
MPV: 12.5 fL (ref 8.9–12.5)
Nucleated RBC: 0 /100 WBC (ref 0.0–0.0)
Platelets: 227 10*3/uL (ref 142–346)
RBC: 4.87 10*6/uL (ref 4.20–5.90)
RDW: 13 % (ref 11–15)
WBC: 10.29 10*3/uL — ABNORMAL HIGH (ref 3.10–9.50)

## 2019-06-03 LAB — BASIC METABOLIC PANEL
Anion Gap: 13 (ref 5.0–15.0)
BUN: 21 mg/dL (ref 9.0–28.0)
CO2: 25 mEq/L (ref 21–29)
Calcium: 9.7 mg/dL (ref 8.5–10.5)
Chloride: 95 mEq/L — ABNORMAL LOW (ref 100–111)
Creatinine: 1 mg/dL (ref 0.5–1.5)
Glucose: 97 mg/dL (ref 70–100)
Potassium: 4.3 mEq/L (ref 3.5–5.1)
Sodium: 133 mEq/L — ABNORMAL LOW (ref 136–145)

## 2019-06-03 LAB — URINALYSIS
Blood, UA: NEGATIVE
Glucose, UA: NEGATIVE
Ketones UA: 40 — AB
Nitrite, UA: POSITIVE — AB
Protein, UR: 30 — AB
Specific Gravity UA: 1.028 (ref 1.001–1.035)
Urine pH: 6 (ref 5.0–8.0)
Urobilinogen, UA: 1 (ref 0.2–2.0)

## 2019-06-03 LAB — URINE MICROSCOPIC: Urine Bacteria: NONE SEEN /hpf

## 2019-06-03 LAB — HEPATIC FUNCTION PANEL
ALT: 18 U/L (ref 0–55)
AST (SGOT): 22 U/L (ref 5–34)
Albumin/Globulin Ratio: 1.2 (ref 0.9–2.2)
Albumin: 4.4 g/dL (ref 3.5–5.0)
Alkaline Phosphatase: 84 U/L (ref 38–106)
Bilirubin Direct: 0.7 mg/dL — ABNORMAL HIGH (ref 0.0–0.5)
Bilirubin Indirect: 1.2 mg/dL — ABNORMAL HIGH (ref 0.2–1.0)
Bilirubin, Total: 1.9 mg/dL — ABNORMAL HIGH (ref 0.2–1.2)
Globulin: 3.6 g/dL (ref 2.0–3.7)
Protein, Total: 8 g/dL (ref 6.0–8.3)

## 2019-06-03 LAB — SYPHILIS SCREEN IGG AND IGM: Syphilis Screen IgG and IgM: REACTIVE — AB

## 2019-06-03 LAB — HEMOLYSIS INDEX: Hemolysis Index: 6 (ref 0–18)

## 2019-06-03 LAB — GFR: EGFR: >60

## 2019-06-03 LAB — HEPATITIS B CORE ANTIBODY, IGM: Hepatitis B Core IgM: NONREACTIVE

## 2019-06-03 LAB — HIV-1/2 AG/AB 4TH GEN. W/ REFLEX: HIV Ag/Ab, 4th Generation: NONREACTIVE

## 2019-06-03 LAB — HEPATITIS C ANTIBODY: Hepatitis C, AB: NONREACTIVE

## 2019-06-03 LAB — HEPATITIS B SURFACE ANTIGEN W/ REFLEX TO CONFIRMATION: Hepatitis B Surface Antigen: NONREACTIVE

## 2019-06-04 LAB — RPR (REFLEX TO TITER AND CONFIRMATION): RPR: REACTIVE — AB

## 2019-06-04 LAB — RPR QUANTITATIVE: RPR Quantitative: 1:2 {titer} — AB

## 2019-06-11 ENCOUNTER — Ambulatory Visit (INDEPENDENT_AMBULATORY_CARE_PROVIDER_SITE_OTHER): Payer: Self-pay | Admitting: Cardiovascular Disease

## 2019-06-15 ENCOUNTER — Emergency Department: Payer: Medicaid HMO

## 2019-06-15 ENCOUNTER — Emergency Department
Admission: EM | Admit: 2019-06-15 | Discharge: 2019-06-15 | Disposition: A | Discharge status: Home / Self Care | Payer: Medicaid HMO | Attending: Student in an Organized Health Care Education/Training Program | Admitting: Student in an Organized Health Care Education/Training Program

## 2019-06-15 DIAGNOSIS — Z8249 Family history of ischemic heart disease and other diseases of the circulatory system: Secondary | ICD-10-CM | POA: Insufficient documentation

## 2019-06-15 DIAGNOSIS — R079 Chest pain, unspecified: Secondary | ICD-10-CM

## 2019-06-15 DIAGNOSIS — F17210 Nicotine dependence, cigarettes, uncomplicated: Secondary | ICD-10-CM | POA: Insufficient documentation

## 2019-06-15 DIAGNOSIS — M79605 Pain in left leg: Secondary | ICD-10-CM | POA: Insufficient documentation

## 2019-06-15 LAB — COMPREHENSIVE METABOLIC PANEL
ALT: 16 U/L (ref 0–55)
AST (SGOT): 13 U/L (ref 5–34)
Albumin/Globulin Ratio: 1.4 (ref 0.9–2.2)
Albumin: 3.7 g/dL (ref 3.5–5.0)
Alkaline Phosphatase: 64 U/L (ref 38–106)
Anion Gap: 9 (ref 5.0–15.0)
BUN: 11 mg/dL (ref 9–28)
Bilirubin, Total: 1.1 mg/dL (ref 0.2–1.2)
CO2: 26 mEq/L (ref 22–29)
Calcium: 8.9 mg/dL (ref 8.5–10.5)
Chloride: 102 mEq/L (ref 100–111)
Creatinine: 0.9 mg/dL (ref 0.7–1.3)
Globulin: 2.7 g/dL (ref 2.0–3.6)
Glucose: 99 mg/dL (ref 70–100)
Potassium: 4.3 mEq/L (ref 3.5–5.1)
Protein, Total: 6.4 g/dL (ref 6.0–8.3)
Sodium: 137 mEq/L (ref 136–145)

## 2019-06-15 LAB — CBC AND DIFFERENTIAL
Absolute NRBC: 0 10*3/uL (ref 0.00–0.00)
Basophils Absolute Automated: 0.07 10*3/uL (ref 0.00–0.08)
Basophils Automated: 0.7 %
Eosinophils Absolute Automated: 0.35 10*3/uL (ref 0.00–0.44)
Eosinophils Automated: 3.4 %
Hematocrit: 35.9 % — ABNORMAL LOW (ref 37.6–49.6)
Hgb: 12.5 g/dL (ref 12.5–17.1)
Immature Granulocytes Absolute: 0.04 10*3/uL (ref 0.00–0.07)
Immature Granulocytes: 0.4 %
Lymphocytes Absolute Automated: 3.33 10*3/uL — ABNORMAL HIGH (ref 0.42–3.22)
Lymphocytes Automated: 32.3 %
MCH: 31 pg (ref 25.1–33.5)
MCHC: 34.8 g/dL (ref 31.5–35.8)
MCV: 89.1 fL (ref 78.0–96.0)
MPV: 10.4 fL (ref 8.9–12.5)
Monocytes Absolute Automated: 0.88 10*3/uL — ABNORMAL HIGH (ref 0.21–0.85)
Monocytes: 8.5 %
Neutrophils Absolute: 5.65 10*3/uL (ref 1.10–6.33)
Neutrophils: 54.7 %
Nucleated RBC: 0 /100 WBC (ref 0.0–0.0)
Platelets: 231 10*3/uL (ref 142–346)
RBC: 4.03 10*6/uL — ABNORMAL LOW (ref 4.20–5.90)
RDW: 13 % (ref 11–15)
WBC: 10.32 10*3/uL — ABNORMAL HIGH (ref 3.10–9.50)

## 2019-06-15 LAB — URINALYSIS REFLEX TO MICROSCOPIC EXAM - REFLEX TO CULTURE
Bilirubin, UA: NEGATIVE
Blood, UA: NEGATIVE
Glucose, UA: NEGATIVE
Ketones UA: NEGATIVE
Leukocyte Esterase, UA: NEGATIVE
Nitrite, UA: NEGATIVE
Protein, UR: NEGATIVE
Specific Gravity UA: 1.013 (ref 1.001–1.035)
Urine pH: 6 (ref 5.0–8.0)
Urobilinogen, UA: NORMAL mg/dL (ref 0.2–2.0)

## 2019-06-15 LAB — ECG 12-LEAD
Atrial Rate: 68 {beats}/min
P Axis: 69 degrees
P-R Interval: 148 ms
Q-T Interval: 392 ms
QRS Duration: 82 ms
QTC Calculation (Bezet): 416 ms
R Axis: 10 degrees
T Axis: 27 degrees
Ventricular Rate: 68 {beats}/min

## 2019-06-15 LAB — GFR: EGFR: >60

## 2019-06-15 LAB — TROPONIN I: Troponin I: 0.01 ng/mL (ref 0.00–0.05)

## 2019-06-15 LAB — LIPASE: Lipase: 75 U/L (ref 8–78)

## 2019-06-15 MED ORDER — ALUM & MAG HYDROXIDE-SIMETH 200-200-20 MG/5ML PO SUSP
30.00 mL | Freq: Once | ORAL | Status: AC
Start: 2019-06-15 — End: 2019-06-15
  Administered 2019-06-15: 04:00:00 30 mL via ORAL
  Filled 2019-06-15: qty 30

## 2019-06-15 MED ORDER — LIDOCAINE VISCOUS HCL 2 % MT SOLN
10.00 mL | Freq: Once | OROMUCOSAL | Status: AC
Start: 2019-06-15 — End: 2019-06-15
  Administered 2019-06-15: 04:00:00 10 mL via OROMUCOSAL
  Filled 2019-06-15: qty 15

## 2019-06-15 MED ORDER — SODIUM CHLORIDE 0.9 % IV BOLUS
1000.00 mL | Freq: Once | INTRAVENOUS | Status: AC
Start: 2019-06-15 — End: 2019-06-15
  Administered 2019-06-15: 04:00:00 1000 mL via INTRAVENOUS

## 2019-06-15 NOTE — EDIE (Signed)
COLLECTIVE?NOTIFICATION?06/15/2019 03:06?MANU, [name] R?MRN: [id number]    Criteria Met      5 ED Visits in 12 Months    3 Different Facilities in 90 Days    Security and Safety  No recent Security Events currently on file    ED Care Guidelines  There are currently no ED Care Guidelines for this patient. Please check your facility's medical records system.    Flags      Negative COVID-19 Lab Result - [facility] - A specimen collected from this patient was negative for COVID-19 / Attributed By: [place] Department [facility] / Attributed On: 06/01/2019       Prescription Monitoring Program  000??- Narcotic Use Score  000??- Sedative Use Score  000??- Stimulant Use Score  000??- Overdose Risk Score  - All Scores range from 000-999 with 75% of the population scoring < 200 and on 1% scoring above 650  - The last digit of the narcotic, sedative, and stimulant score indicates the number of active prescriptions of that type  - Higher Use scores correlate with increased prescribers, pharmacies, mg equiv, and overlapping prescriptions  - Higher Overdose Risk Scores correlate with increased risk of unintentional overdose death   Concerning or unexpectedly high scores should prompt a review of the PMP record; this does not constitute checking PMP for prescribing purposes.      E.D. Visit Count (12 mo.)  Facility Visits   [place] - [facility] 2   [facility] 6   [place] [facility] 1   Total 9   Note: Visits indicate total known visits.     Recent Emergency Department Visit Summary  Date Facility [facility] Type Diagnoses or Chief Complaint   Jun 15, 2019 [facility] [place] [facility] [place] [facility]      Leg Pain      May 30, 2019 [place] [facility] [place] [name] [place] [facility]      trouble breathing, chest pain      Chest Pain      Other chest pain      May 28, 2019 [place] - [name] [place] [facility]      chest pain      Chest pain, unspecified      Contact with and (suspected) exposure to covid-19      May 12, 2019 [place] - [name] [place] [facility]      chest pain      Chest pain, unspecified      Mar 27, 2019 [place] [facility] [place] [name] [place] [facility]      CP      Chest Pain      Chest pain, unspecified      Feb 11, 2019 [place] [facility] [place] [name] [place] [facility]      Chet tightness      Chest Pain      Other chronic pain      Other chest pain      Malingerer [conscious simulation]      Feb 03, 2019 [place] [facility] [place] [name] [place] [facility]      Chest pain      Heart palpitation      Shortness of Breath      Other chest pain      Nicotine dependence, unspecified, uncomplicated      Other psychoactive substance abuse, uncomplicated      Jan 04, 2019 [place] [facility] [place] [name]  [facility]gency  syncopal episode      Syncope      Altered mental status, unspecified      Jan 02, 2019 [place] [facility] [place] [name] [place] [facility]      abd pain      Abdominal Pain      Chest pain, unspecified      Other psychoactive substance abuse, uncomplicated      Generalized abdominal pain      Brief psychotic disorder          Recent Inpatient Visit Summary  No recorded inpatient visits.     Care Team  Provider Specialty Phone Fax Service Dates   SPARKS, Darrol Jump, NP-C Nurse Practitioner: Family   Current      Collective Portal  This patient has registered at the [facility] Emergency Department   For more information visit: [email or web address]     PLEASE NOTE:     1.   Any care recommendations and other clinical information are provided as guidelines or for historical purposes only, and providers should exercise their own clinical judgment when providing care.    2.   You may only use this information for purposes of treatment, payment or health care operations activities, and subject to the limitations of applicable Collective Policies.    3.   You should consult directly with the organization that provided a care guideline or other clinical history with any  questions about additional information or accuracy or completeness of information provided.    ? 2021 [place], [employer] - [email or web address]

## 2019-06-15 NOTE — ED Provider Notes (Signed)
History     Chief Complaint   Patient presents with    Leg Pain    Chest Pain     54 year old male sent in from detox center for reported chest pain, left leg pain, headache.  Patient reports that he has been in detox for about 2 weeks, is detoxing from heroin.  Reports that he stopped taking Suboxone about 8 days ago.  Reports that he has epigastric abdominal pain and chest pain.  Does not have shortness of breath.  Also reports that he has pain in the left leg.  Reports he has varicose veins on that side and that up until recently he had not been wearing his compression hose.  No nausea vomiting.  Headache is diffuse, no thunderclap onset, mild in nature, no fever, no pain with movement of the neck.    The history is provided by the patient.   Leg Pain  Location:  Leg  Pain details:     Quality:  Aching    Radiates to:  Does not radiate    Severity:  Mild    Onset quality:  Gradual    Timing:  Constant    Progression:  Unchanged  Chronicity:  New  Relieved by:  Nothing  Worsened by:  Nothing  Ineffective treatments: Patient has a compression stocking on the left lower leg.  Associated symptoms: no back pain and no fever    Chest Pain  Associated symptoms: no abdominal pain, no back pain, no fever, no numbness and no shortness of breath         Past Medical History:   Diagnosis Date    Depression     Gallstones     Gastritis     Heart murmur     Heroin abuse     Renal calculus        History reviewed. No pertinent surgical history.    Family History   Problem Relation Age of Onset    Diabetes Mother     Hypertension Mother        Social  Social History     Tobacco Use    Smoking status: Current Every Day Smoker     Packs/day: 0.25     Years: 0.50     Pack years: 0.12     Types: Cigarettes    Smokeless tobacco: Never Used   Substance Use Topics    Alcohol use: Yes     Comment: rarely    Drug use: Yes     Types: Marijuana, Cocaine     Comment: heroin        .     Allergies   Allergen Reactions     Gluten      Pt states "white bread"    Ibuprofen Nausea And Vomiting    Percocet [Oxycodone-Acetaminophen]     Tylenol [Acetaminophen] Hives     Tyelnol#3, hives and throw up       Home Medications     Med List Status: In Progress Set By: Darliss Ridgel, RN at 06/15/2019  3:15 AM        No Medications           Review of Systems   Constitutional: Negative for chills and fever.   HENT: Negative.    Eyes: Negative.    Respiratory: Negative for shortness of breath.    Cardiovascular: Positive for chest pain.   Gastrointestinal: Negative for abdominal pain.   Endocrine: Negative for polyuria.   Genitourinary:  Negative for flank pain.   Musculoskeletal: Negative for back pain, gait problem and joint swelling.   Allergic/Immunologic: Negative.    Neurological: Negative for numbness.   Hematological: Negative for adenopathy. Does not bruise/bleed easily.   Psychiatric/Behavioral: Negative for suicidal ideas.   All other systems reviewed and are negative.      Physical Exam    BP: 132/82, Heart Rate: 96, Temp: 98.8 F (37.1 C), Resp Rate: 14, SpO2: 99 %, Weight: 76.4 kg    Physical Exam  Vitals and nursing note reviewed.   Constitutional:       Appearance: He is well-developed.   HENT:      Head: Normocephalic and atraumatic.   Eyes:      Pupils: Pupils are equal, round, and reactive to light.   Cardiovascular:      Rate and Rhythm: Normal rate and regular rhythm.      Heart sounds: Normal heart sounds.   Pulmonary:      Breath sounds: Normal breath sounds. No wheezing or rales.   Abdominal:      Palpations: Abdomen is soft.      Tenderness: There is no abdominal tenderness.   Musculoskeletal:         General: Normal range of motion.      Cervical back: Normal range of motion and neck supple.   Skin:     General: Skin is warm and dry.      Findings: No rash.   Neurological:      Mental Status: He is alert and oriented to person, place, and time.   Psychiatric:         Behavior: Behavior normal.         Thought  Content: Thought content normal.     EKG: Normal sinus rhythm, rate 68, normal intervals and axis, no ST or T wave changes    The results of the diagnostic studies below were reviewed by the ED provider:    Labs  Results     Procedure Component Value Units Date/Time    UA Reflex to Micro - Reflex to Culture [[id number]] Collected: 06/15/19 0527    Specimen: Urine, Clean Catch Updated: 06/15/19 0535     Urine Type Urine, Clean Ca     Color, UA Yellow     Clarity, UA Clear     Specific Gravity UA 1.013     Urine pH 6.0     Leukocyte Esterase, UA Negative     Nitrite, UA Negative     Protein, UR Negative     Glucose, UA Negative     Ketones UA Negative     Urobilinogen, UA Normal mg/dL      Bilirubin, UA Negative     Blood, UA Negative    Narrative:      Replace urinary catheter prior to obtaining the urine culture  if it has been in place for greater than or equal to 14  days:->N/A No Foley  Indications for U/A Reflex to Micro - Reflex to  Culture:->Suprapubic Pain/Tenderness or Dysuria    Troponin I [[id number]] Collected: 06/15/19 0343    Specimen: Blood Updated: 06/15/19 0412     Troponin I 0.01 ng/mL     Narrative:      Replace urinary catheter prior to obtaining the urine culture  if it has been in place for greater than or equal to 14  days:->N/A No Foley  Indications for U/A Reflex to Micro - Reflex to  Culture:->Suprapubic Pain/Tenderness or  Dysuria    Lipase [[id number]] Collected: 06/15/19 0343    Specimen: Blood Updated: 06/15/19 0408     Lipase 75 U/L     Narrative:      Replace urinary catheter prior to obtaining the urine culture  if it has been in place for greater than or equal to 14  days:->N/A No Foley  Indications for U/A Reflex to Micro - Reflex to  Culture:->Suprapubic Pain/Tenderness or Dysuria    GFR [[id number]] Collected: 06/15/19 0343     Updated: 06/15/19 0408     EGFR >60.0    Narrative:      Replace urinary catheter prior to obtaining the urine culture  if it has been in place for greater than  or equal to 14  days:->N/A No Foley  Indications for U/A Reflex to Micro - Reflex to  Culture:->Suprapubic Pain/Tenderness or Dysuria    Comprehensive metabolic panel [[id number]] Collected: 06/15/19 0343    Specimen: Blood Updated: 06/15/19 0408     Glucose 99 mg/dL      BUN 11 mg/dL      Creatinine 0.9 mg/dL      Sodium 086 mEq/L      Potassium 4.3 mEq/L      Chloride 102 mEq/L      CO2 26 mEq/L      Calcium 8.9 mg/dL      Protein, Total 6.4 g/dL      Albumin 3.7 g/dL      AST (SGOT) 13 U/L      ALT 16 U/L      Alkaline Phosphatase 64 U/L      Bilirubin, Total 1.1 mg/dL      Globulin 2.7 g/dL      Albumin/Globulin Ratio 1.4     Anion Gap 9.0    Narrative:      Replace urinary catheter prior to obtaining the urine culture  if it has been in place for greater than or equal to 14  days:->N/A No Foley  Indications for U/A Reflex to Micro - Reflex to  Culture:->Suprapubic Pain/Tenderness or Dysuria    CBC and differential [[id number]]  (Abnormal) Collected: 06/15/19 0343    Specimen: Blood Updated: 06/15/19 0351     WBC 10.32 x10 3/uL      Hgb 12.5 g/dL      Hematocrit 52.8 %      Platelets 231 x10 3/uL      RBC 4.03 x10 6/uL      MCV 89.1 fL      MCH 31.0 pg      MCHC 34.8 g/dL      RDW 13 %      MPV 10.4 fL      Neutrophils 54.7 %      Lymphocytes Automated 32.3 %      Monocytes 8.5 %      Eosinophils Automated 3.4 %      Basophils Automated 0.7 %      Immature Granulocytes 0.4 %      Nucleated RBC 0.0 /100 WBC      Neutrophils Absolute 5.65 x10 3/uL      Lymphocytes Absolute Automated 3.33 x10 3/uL      Monocytes Absolute Automated 0.88 x10 3/uL      Eosinophils Absolute Automated 0.35 x10 3/uL      Basophils Absolute Automated 0.07 x10 3/uL      Immature Granulocytes Absolute 0.04 x10 3/uL      Absolute NRBC 0.00 x10 3/uL     Narrative:  Replace urinary catheter prior to obtaining the urine culture  if it has been in place for greater than or equal to 14  days:->N/A No Foley  Indications for U/A Reflex to Micro -  Reflex to  Culture:->Suprapubic Pain/Tenderness or Dysuria          Radiologic Studies  Radiology Results (24 Hour)     Procedure Component Value Units Date/Time    XR Chest  AP Portable [[id number]] Collected: 06/15/19 0520    Order Status: Completed Updated: 06/15/19 0523    Narrative:      HISTORY: chest pain    COMPARISON: Chest radiograph 05/28/2019    FINDINGS:   LINES/TUBES: None.    LUNGS and PLEURA: No consolidation. No pleural effusion or pneumothorax.    HEART and MEDIASTINUM:  Within normal limits.           Impression:        No acute abnormality.        Debera Lat Merchant   06/15/2019 5:21 AM    US Venous Duplex Doppler Leg Left [[id number]] Collected: 06/15/19 0436    Order Status: Completed Updated: 06/15/19 0440    Narrative:      HISTORY: Leg DVT suspected. 54 year old male. Evaluate for venous  thrombosis.    COMPARISON: Venous duplex study of the left leg 04/13/2016.    TECHNIQUE: US VENOUS LOW EXTREM DUPLX DOPP UNI LEFT: Venous duplex study  was performed using compression scanning with B-mode imaging, color  Doppler, and spectral analysis. Compression augmentation maneuvers were  performed.     FINDINGS: Evaluation is somewhat limited by body habitus. There is no  evidence of deep venous thrombosis. The common femoral, femoral and  popliteal veins appear normally compressible. The Doppler waveforms are  phasic and spontaneous. The visualized portions of the profunda femoris  and greater saphenous vein appear normal. The visualized portions of the  peroneal and posterior tibial veins appear unremarkable. Imaging of the  calf veins is limited. The right common femoral vein is patent. The  proximal veins bilaterally show normal respiratory phasicity.      Impression:        Venous duplex scan of the left leg shows no apparent  abnormality. Within the visualized portions of the venous system, there  is no evidence of venous thrombosis, either superficial or deep. Imaging  of the calf veins is  limited.    Miguel Dibble, MD   06/15/2019 4:38 AM              MDM and ED Course     ED Medication Orders (From admission, onward)    Start Ordered     Status Ordering Provider    06/15/19 0340 06/15/19 0339  sodium chloride 0.9 % bolus 1,000 mL  Once     Route: Intravenous  Ordered Dose: 1,000 mL     Last MAR action: New Bag Taysha Majewski M    06/15/19 0340 06/15/19 0339  lidocaine viscous (XYLOCAINE) 2 % solution 10 mL  Once     Route: Mouth/Throat  Ordered Dose: 10 mL     Last MAR action: Given Gretchen Weinfeld M    06/15/19 0340 06/15/19 0339  alum & mag hydroxide-simethicone (MAALOX PLUS) 200-200-20 mg/5 mL suspension 30 mL  Once     Route: Oral  Ordered Dose: 30 mL     Last MAR action: Given Esiah Bazinet M             MDM  Number of Diagnoses  or Management Options  Chest pain, unspecified type  Pain of left lower extremity  Diagnosis management comments: 54 year old male with chest pain headache and leg pain, labs and imaging are unremarkable, will plan for discharge       Amount and/or Complexity of Data Reviewed  Clinical lab tests: ordered and reviewed  Tests in the radiology section of CPT: ordered and reviewed    Patient Progress  Patient progress: stable                   Procedures    Clinical Impression & Disposition     Clinical Impression  Final diagnoses:   Chest pain, unspecified type   Pain of left lower extremity        ED Disposition     ED Disposition Condition Date/Time Comment    Discharge  Mon Jun 15, 2019  6:24 AM [name] discharge to home/self care.    Condition at disposition: Stable           There are no discharge medications for this patient.         This note was generated by the Epic EMR system/ Dragon speech recognition and may contain inherent errors or omissions not intended by the user. Grammatical errors, random word insertions, deletions, pronoun errors and incomplete sentences are occasional consequences of this technology due to software limitations. Not all errors are caught  or corrected. If there are questions or concerns about the content of this note or information contained within the body of this dictation they should be addressed directly with the author for clarification         Hewitt Blade, [place]  06/15/19 1610       Rosamaria Lints, MD  06/15/19 1003

## 2019-06-15 NOTE — ED Triage Notes (Signed)
c/o L leg pain and chest discomfort.  Patient has been at detox since 4/3 and has been withdrawling from heroin.  Stopped taking Suboxone 8 days ago.  Detox staff applied compression sleeve to L leg.  Also states HA

## 2019-06-15 NOTE — Discharge Instructions (Signed)
Leg Pain    You have been diagnosed with leg pain.    Many things can cause leg pain. Most of the causes are not dangerous and will get better on their own. These can include muscle cramps, bruises, strains, pinched nerves, and minor skin infections.    You had an exam by your doctor today. He or she decided that the cause of your leg pain does not seem to be serious or dangerous.    If your pain continues, you might need another exam or more tests. This is to find out why you have leg pain. The cause of your symptoms doesn't seem dangerous now. You don't need to stay in the hospital.    Though we don't believe your condition is dangerous right now, it is important to be careful. Sometimes a problem that seems mild can become serious later. This is why it is very important that you return here or go to the nearest Emergency Department if you are not improving or your symptoms are getting worse.    Some things you may try at home are:    Over-the-counter pain medications like that have ibuprofen (Advil/Motrin) or acetaminophen (Tylenol) in them. Follow the directions on the package.   Rest the leg as needed. As soon as you are able, start moving your leg again to keep it from getting stiff.    Return here or go to the nearest Emergency Department or follow-up with your doctor if you are not getting better as expected.    Follow the instructions for any medication you are prescribed.     YOU SHOULD SEEK MEDICAL ATTENTION IMMEDIATELY, EITHER HERE OR AT THE NEAREST EMERGENCY DEPARTMENT, IF ANY OF THE FOLLOWING HAPPENS:   You have a fever (temperature higher than 100.4F or 38C).   Your pain does not go away or gets worse.   Your leg or joints (hip, knee, ankle, etc.) are red or swollen.   Your chest hurts or you get short of breath.   You have any other symptoms or concerns, or don't get better as expected.    If you can't follow up with your doctor, or if at any time you feel you need to be  rechecked or seen again, come back here or go to the nearest emergency department.               Chest Pain of Unclear Etiology    You have been seen for chest pain. The cause of your pain is not yet known.    Your doctor has learned about your medical history, examined you, and checked any tests that were done. Still, it is not clear why you are having pain. The doctor thinks there is only a small chance that your pain is caused by a health problem that could lead to serious harm or death. Later, your primary care doctor might do more tests or check you again.    Sometimes chest pain is caused by a health problem that can lead to death, like a:   Heart attack.   Injury to the large blood vessel in your body (aorta).   Blood clot in the lung.   Collapsed lung.     It is not likely that your pain is caused by a health problem that could lead to death if:    Your chest pain lasts only a few seconds at a time   You are not short of breath, nauseated (sick to your stomach), sweaty,   or lightheaded   Your pain gets worse when you twist or bend   Your pain improves with exercise or hard work.    Chest pain is serious. It is very important that you follow up with your regular doctor.    Return here or go to the nearest Emergency Department immediately if:   Your pain makes you short of breath, nauseated (sick to your stomach), or sweaty.   Your pain gets worse when you walk, go up stairs, or exert yourself.   You feel weak, lightheaded, or faint.   It hurts to breathe.   Your leg swells.   Your pain or symptoms get worse    You have new symptoms or concerns.    If you can't follow up with your doctor, or if at any time you feel you need to be rechecked or seen again, come back here or go to the nearest emergency department.

## 2019-06-22 ENCOUNTER — Ambulatory Visit (INDEPENDENT_AMBULATORY_CARE_PROVIDER_SITE_OTHER): Payer: Self-pay | Admitting: Nurse Practitioner

## 2019-06-25 ENCOUNTER — Encounter (INDEPENDENT_AMBULATORY_CARE_PROVIDER_SITE_OTHER): Payer: Self-pay | Admitting: Internal Medicine

## 2019-07-02 ENCOUNTER — Ambulatory Visit (INDEPENDENT_AMBULATORY_CARE_PROVIDER_SITE_OTHER): Payer: Medicaid HMO | Admitting: Internal Medicine

## 2019-07-02 ENCOUNTER — Encounter (INDEPENDENT_AMBULATORY_CARE_PROVIDER_SITE_OTHER): Payer: Self-pay | Admitting: Internal Medicine

## 2019-07-02 VITALS — BP 128/81 | HR 97 | Temp 97.8°F | Ht 67.5 in | Wt 172.6 lb

## 2019-07-02 DIAGNOSIS — I83812 Varicose veins of left lower extremities with pain: Secondary | ICD-10-CM

## 2019-07-02 DIAGNOSIS — Z1211 Encounter for screening for malignant neoplasm of colon: Secondary | ICD-10-CM

## 2019-07-02 DIAGNOSIS — Z0000 Encounter for general adult medical examination without abnormal findings: Secondary | ICD-10-CM

## 2019-07-02 DIAGNOSIS — Z833 Family history of diabetes mellitus: Secondary | ICD-10-CM

## 2019-07-02 DIAGNOSIS — Z125 Encounter for screening for malignant neoplasm of prostate: Secondary | ICD-10-CM

## 2019-07-02 NOTE — Patient Instructions (Signed)
Wt Readings from Last 3 Encounters:   07/02/19 78.3 kg (172 lb 9.6 oz)   06/15/19 76.4 kg (168 lb 6.9 oz)   05/30/19 79.8 kg (176 lb)     Body mass index is 26.63 kg/m.           BMI readings as below   18.9 to 24.9 -Normal   25-29.9-overweight  30-34.9-stage-1  35-39.9-stage 2  40-44.9-stage 3  >45-morbid         Try to limit the amount of saturated fat and trans fat in your diet.  These are in foods like red meat, vegetable oil, butter, dairy products that are not low-fat (such as most cheese, 2% or whole milk, creamer, etc), fried foods, and rich sweets like cakes/donuts/etc.  You should aim to keep saturated fats under 15-20g per day.  In addition, read ingredient labels and avoid any hydrogenated or partially-hydrogenated oils.      There are some fats that are good for you - these are unsaturated fats (especially monounsaturated).  These are found in olive oil, canola oil, nuts, seeds, and avocados.  Try to replace saturated and trans fats with these kinds of foods.    Try to limit the amount of sodium in your diet to less than 2000mg  per day.  Most people do not get the majority of their sodium from table salt.  The sodium comes from processed foods.  Anything canned, frozen, pre-packaged, etc usually has a large amount of sodium, so read the labels.  Deli meat is another common source.      Eating out is the most common source of dietary sodium.  Almost all restaurant meals have extremely high sodium (1000-3000mg  plus!), even in most cases the salads.  Minimize eating out, and ask if they have any low sodium options.  But generally, fresh cooking at home is the healthiest.      Try to increase your physical activity, especially cardio.  Even starting small with walking 10 minutes a day helps.  Gradually build up to a goal of 30 minutes of moderate activity (brisk walking) 5 days per week.  You should also add in some core strength training (abdomen and back) and light stretching to help prevent injury and  chronic back pain.    See me once yearly for annual preventative visit.  This is different and separate from any problem-related visit.    Be compliant with medications  Increase vegetables, fruits, and fiber in your diet.  Exercise at least 30 minutes 5 days per week.   Always wear your seatbelt in the car.  Wear sunscreen that is broad-spectrum (UVA/UVB) and at least SPF 30.  Avoid alcohol  drinking  And driving  Goal Blood Pressure <140/90  Schedule dental exam and cleaning every 6 months  Have your vision checked every 1-2 years.      Recommended preventive health screenings and vaccines for all adults   Blood pressure Every 2 years -- 70 years of age and older  Body mass index (BMI) Periodically -- 84 years of age and older  Cholesterol Every 5 years -- men 63 years of age and older;  Colorectal cancer Between 79 and 28 years of age -- colonoscopy every 10 years.   Depression Routinely   Alcohol misuse  Routinely  Tobacco use   Routinely    Vaccinations  Tetanus-diphtheria-pertussis (Td/Tdap)1 dose Tdap, then Td every 10 years  Influenza Every flu season  Pneumococcal 1 dose -- 68 years of age  and older  Zoster 1 dose -- 37 years of age and older    Females  Mammogram Every 1-2 years -- women 6 years of age and older  Cervical cancer Every 3 years -- Pap smear for women 21-65 years   Chlamydia Routinely -- women 35 years of age and younger if sexually active  Osteoporosis (DEXA) Routinely -- women 75 years of age and older    Age 72-64 (middle years):   Diet and exercise d/w patient.   Self breast and testicular exam( in Males)  encouraged monthly   Clinical breast examination (yearly).   Skin exam with dermatology --for excessive skin exposure to sun or precancerous   Fasting lipid profile or nonfasting total cholesterol and HDL  cholesterol level (every 5 years).   Pelvic exam annually and Pap test every 3 years.   Digital rectal examination (annually).   After age 21, screen for colorectal cancer  with colonoscopy every 10 years or as advised.   Mammogram every 2 years    Oral cavity examination (every 1 to 3 years).   Dental exam (every 6 to 12 months).   Glaucoma (every 1 to 2 years after age 83).  Goal calcium intake 1092m daily (diet +/- supplement)  Goal vitamin D intake 600units daily (diet +/- supplement)    I appreciate the opportunity to help take care of you and your family. Here is some basic housekeeping:    1) Please be aware that I often send results/next steps via MyChart. Please check your account regularly.  2) There are some results that take up to 10 business days to be received and/or processed. If you have not heard from me regarding results in 7-10 business days please feel free to call [phone] send a message via [facility]

## 2019-07-02 NOTE — Progress Notes (Signed)
Have you seen any specialists/other providers since your last visit with us?    Yes    Arm preference verified?   Yes    The patient is due for colonoscopy

## 2019-07-02 NOTE — Progress Notes (Signed)
Chief Complaint   Patient presents with    Annual Exam     Not fasting     Annual physical examination    Date Time: 9:37 AM  Patient Name: [name] [name]  Attending Physician: Dr Larry Sierras      CC; annual physical examination                     History of Present Illness:   [name] [name] is a 54 y.o. male who presentsto my office with above.   he  Is new  Hx heroin abuse, coccaine  in the past , reports sober     Hx left varicose vein       The following portions of the patient's history were reviewed and updated as appropriate: allergies, current medications, past family history, past medical history, past social history and problem list.  Past Medical History:     Past Medical History:   Diagnosis Date    Gallstones     Gastritis     Heart murmur     Heroin abuse      Patient Active Problem List    Diagnosis Date Noted    Costochondral pain 08/07/2015    Chest pain, unspecified type 08/06/2015    Prediabetes 08/06/2015    Chest pain, unspecified 09/13/2011    Abdominal pain, other specified site 09/13/2011    Nausea & vomiting 09/13/2011    Substance abuse 09/13/2011    Chest pain syndrome 09/10/2011     Class: Acute    Cocaine abuse 09/10/2011     Class: Acute    Alcohol addiction 09/10/2011     Class: Chronic     Past Surgical History:   History reviewed. No pertinent surgical history.    Medications:     Current Outpatient Medications   Medication Sig Dispense Refill    Omeprazole Magnesium (PRILOSEC OTC PO) Take by mouth       No current facility-administered medications for this visit.       Allergies:     Allergies   Allergen Reactions    Gluten      Pt states "white bread"    Ibuprofen Nausea And Vomiting    Percocet [Oxycodone-Acetaminophen]     Tylenol [Acetaminophen] Hives     Tyelnol#3, hives and throw up       Social History:     Social History     Tobacco Use    Smoking status: Current Every Day Smoker     Packs/day: 0.25     Years: 10.00     Pack years: 2.50     Types:  Cigarettes    Smokeless tobacco: Never Used   Substance Use Topics    Alcohol use: Yes     Comment: rarely      Family History:     Family History   Problem Relation Age of Onset    Diabetes Mother     Hypertension Mother        Review of Systems:         Review of Systems:   Review of Systems - History obtained from the patient  General ROS: negative for - chills, fatigue, fever, malaise, ,  Psychological ROS: negative for - anxiety, and , depression,  Ophthalmic ROS: negative for - change in vision   ENT ROS: negative for - hearing change,   Respiratory ROS: no cough, shortness of breath, or wheezing  negative for - hemoptysis  Cardiovascular ROS:  no chest pain or dyspnea on exertion  negative for - palpitations or paroxysmal nocturnal dyspnea  Gastrointestinal ROS: no abdominal pain, change in bowel habits, or black or bloody stools  Genito-Urinary ROS: no dysuria, trouble voiding, or hematuria  negative for - hematuria, urinary frequency/urgency   Musculoskeletal ROS: negative for - gait disturbance, joint pain, joint stiffness, joint swelling, muscle pain or muscular weakness  Neurological ROS: negative for - dizziness, gait disturbance, headaches, numbness/tingling or visual changes    Physical Examination:   Reviewed vitals as below  Vitals:    07/02/19 0923   BP: 128/81   Pulse: 97   Temp: 97.8 F (36.6 C)   SpO2: 99%     General appearance - alert, well appearing, and in no distress and oriented to person, place, and time  Mental status - alert, oriented to person, place, and time, normal mood, behavior, speech, dress, motor activity, and thought processes  Eyes - pupils equal and reactive, extraocular eye movements intact  Ears - bilateral TM's and external ear canals normal  Neck - supple, no significant adenopathy, carotids upstroke normal bilaterally, no bruits and thyroid exam: thyroid is normal in size without nodules or tenderness  Chest - clear to auscultation, no wheezes, rales or rhonchi,  symmetric air entry  Heart - normal rate, regular rhythm, normal S1, S2, no murmurs, rubs, clicks or gallops  Abdomen - soft, nontender, nondistended, no masses or organomegaly  No hepatosplenomegaly or organomegaly  Neurological - alert, oriented, normal speech, no focal findings  motor and sensory grossly normal bilaterally  Musculoskeletal - no joint tenderness, deformity or swelling, no muscular tenderness noted  Extremities - dorsalis pedis  pulses normal, no pedal edema, no clubbing or cyanosis  Physical Examination: Extremities - varicose veins noted left calf           GU Male -    patient declined prostate exam        Assessment and plan:  Annual physical  See me once yearly for annual preventative visit.  This is different and separate from any problem-related visit.    Be compliant with medications  Increase vegetables, fruits, and fiber in your diet.  Exercise at least 30 minutes 5 days per week.   Always wear your seatbelt in the car.  Wear sunscreen that is broad-spectrum (UVA/UVB) and at least SPF 30.  Avoid alcohol  drinking  And driving  Goal Blood Pressure <140/90  Schedule dental exam and cleaning every 6 months  Have your vision checked every 1-2 years.      Vaccinations  Tetanus-diphtheria-pertussis (Td/Tdap)1 dose Tdap, then Td every 10 years; reports < 10 years  Influenza Every flu season; fall 2020  Pneumococcal 1 dose -- 84 years of age and older  Shingrix-2 doses -- 59 years of age and older; reports UTD with [facility]   covid-19- Declined for now undecided     Cancer screening;  Colonoscopy   Discussed colonoscopy Vs cologuard Vs FIT  Will try cologuard  Aware  That will need to do colonoscopy if cologuard positive and if negative repeat in 3 years  Also aware, cologuard can be false positive      Labs ordered today      - Comprehensive metabolic panel; Future  - Hemoglobin A1C; Future  - Lipid panel; Future  - TSH; Future  - CBC and differential; Future  - T4, free; Future  - Urinalysis Reflex  to Microscopic Exam- Reflex to Culture; Future  - PSA;  Future    2. Screening for prostate cancer    - PSA; Future    3. FHx: diabetes mellitus    - Hemoglobin A1C; Future    4. Screening for colon cancer    - Cologuard    5. Varicose veins of leg with pain, left    - Vascular and Vein Referral: Romelle Starcher, MD ([place])    Body mass index is 26.63 kg/m.        Dr [name] MD  Internist  [facility] [place]  [address], [place],  [place] [id number]  [phone]  Fax-[phone]

## 2019-07-03 ENCOUNTER — Other Ambulatory Visit (FREE_STANDING_LABORATORY_FACILITY): Payer: Medicaid HMO

## 2019-07-03 DIAGNOSIS — Z125 Encounter for screening for malignant neoplasm of prostate: Secondary | ICD-10-CM

## 2019-07-03 DIAGNOSIS — Z0000 Encounter for general adult medical examination without abnormal findings: Secondary | ICD-10-CM

## 2019-07-03 DIAGNOSIS — Z833 Family history of diabetes mellitus: Secondary | ICD-10-CM

## 2019-07-03 LAB — URINALYSIS REFLEX TO MICROSCOPIC EXAM - REFLEX TO CULTURE
Bilirubin, UA: NEGATIVE
Blood, UA: NEGATIVE
Glucose, UA: NEGATIVE
Ketones UA: NEGATIVE
Leukocyte Esterase, UA: NEGATIVE
Nitrite, UA: NEGATIVE
Protein, UR: NEGATIVE
Specific Gravity UA: 1.013 (ref 1.001–1.035)
Urine Bacteria: NONE SEEN /hpf
Urine pH: 7.5 (ref 5.0–8.0)
Urobilinogen, UA: 0.2 (ref 0.2–2.0)

## 2019-07-03 LAB — COMPREHENSIVE METABOLIC PANEL
ALT: 11 U/L (ref 0–55)
AST (SGOT): 16 U/L (ref 5–34)
Albumin/Globulin Ratio: 1.3 (ref 0.9–2.2)
Albumin: 4.1 g/dL (ref 3.5–5.0)
Alkaline Phosphatase: 79 U/L (ref 38–106)
Anion Gap: 6 (ref 5.0–15.0)
BUN: 7 mg/dL — ABNORMAL LOW (ref 9.0–28.0)
Bilirubin, Total: 0.9 mg/dL (ref 0.2–1.2)
CO2: 30 mEq/L — ABNORMAL HIGH (ref 21–29)
Calcium: 9.9 mg/dL (ref 8.5–10.5)
Chloride: 101 mEq/L (ref 100–111)
Creatinine: 0.8 mg/dL (ref 0.5–1.5)
Globulin: 3.2 g/dL (ref 2.0–3.7)
Glucose: 95 mg/dL (ref 70–100)
Potassium: 4.5 mEq/L (ref 3.5–5.1)
Protein, Total: 7.3 g/dL (ref 6.0–8.3)
Sodium: 137 mEq/L (ref 136–145)

## 2019-07-03 LAB — TSH: TSH: 1.15 u[IU]/mL (ref 0.35–4.94)

## 2019-07-03 LAB — CBC AND DIFFERENTIAL
Absolute NRBC: 0 10*3/uL (ref 0.00–0.00)
Basophils Absolute Automated: 0.06 10*3/uL (ref 0.00–0.08)
Basophils Automated: 0.8 %
Eosinophils Absolute Automated: 0.22 10*3/uL (ref 0.00–0.44)
Eosinophils Automated: 2.8 %
Hematocrit: 41.9 % (ref 37.6–49.6)
Hgb: 13.1 g/dL (ref 12.5–17.1)
Immature Granulocytes Absolute: 0.02 10*3/uL (ref 0.00–0.07)
Immature Granulocytes: 0.3 %
Lymphocytes Absolute Automated: 2.19 10*3/uL (ref 0.42–3.22)
Lymphocytes Automated: 28.1 %
MCH: 30.1 pg (ref 25.1–33.5)
MCHC: 31.3 g/dL — ABNORMAL LOW (ref 31.5–35.8)
MCV: 96.3 fL — ABNORMAL HIGH (ref 78.0–96.0)
MPV: 12.8 fL — ABNORMAL HIGH (ref 8.9–12.5)
Monocytes Absolute Automated: 0.74 10*3/uL (ref 0.21–0.85)
Monocytes: 9.5 %
Neutrophils Absolute: 4.56 10*3/uL (ref 1.10–6.33)
Neutrophils: 58.5 %
Nucleated RBC: 0 /100 WBC (ref 0.0–0.0)
Platelets: 220 10*3/uL (ref 142–346)
RBC: 4.35 10*6/uL (ref 4.20–5.90)
RDW: 14 % (ref 11–15)
WBC: 7.79 10*3/uL (ref 3.10–9.50)

## 2019-07-03 LAB — PSA: Prostate Specific Antigen, Total: 0.357 ng/mL (ref 0.000–4.000)

## 2019-07-03 LAB — GFR: EGFR: >60

## 2019-07-03 LAB — LIPID PANEL
Cholesterol / HDL Ratio: 3.2
Cholesterol: 183 mg/dL (ref 0–199)
HDL: 57 mg/dL (ref 40–9999)
LDL Calculated: 111 mg/dL — ABNORMAL HIGH (ref 0–99)
Triglycerides: 75 mg/dL (ref 34–149)
VLDL Calculated: 15 mg/dL (ref 10–40)

## 2019-07-03 LAB — HEMOGLOBIN A1C
Average Estimated Glucose: 108.3 mg/dL
Hemoglobin A1C: 5.4 % (ref 4.6–5.9)

## 2019-07-03 LAB — T4, FREE: T4 Free: 1.01 ng/dL (ref 0.70–1.48)

## 2019-07-03 LAB — HEMOLYSIS INDEX: Hemolysis Index: 15 (ref 0–18)

## 2019-07-07 ENCOUNTER — Encounter (INDEPENDENT_AMBULATORY_CARE_PROVIDER_SITE_OTHER): Payer: Self-pay | Admitting: Internal Medicine

## 2019-07-07 NOTE — Progress Notes (Signed)
PLEASE CALL AND NOTIFY PATIENT WITH DOCUMENTATION     All labs normal   Borderline LDL     [name] [name]    [employer] work results:  Cell count (WBC, Hemoglobin and Hematocrit  And Platelets) are normal  Lab Results       Component                Value               Date                       WBC                      7.79                07/03/2019                 HGB                      13.1                07/03/2019                 HCT                      41.9                07/03/2019                 MCV                      96.3             07/03/2019                 PLT                      220                 07/03/2019              Your electrolytes( sodium, Chloride, potassium, calcium) normal  Your Kidney test Normal  Your Fasting Blood glucose is  Normal  Lab Results       Component                Value               Date                       CREAT                    0.8                 07/03/2019                 BUN                      7.0 (L)             07/03/2019                 NA                       137  07/03/2019                 K                        4.5                 07/03/2019                 CL                       101                 07/03/2019                 CO2                      30 (H)              07/03/2019              Lab Results       Component                Value               Date                       HGBA1C                   5.4                 07/03/2019            The test we did to check your average sugar s for diabetes is normal      Your Liver test- normal  Lab Results       Component                Value               Date                       ALT                      11                  07/03/2019                 AST                      16                  07/03/2019                 ALKPHOS                  79                  07/03/2019                 BILITOTAL                0.9                 07/03/2019                Thyroid test  Normal  Lab Results       Component                Value               Date                       TSH                      1.15                07/03/2019            Lab Results       Component                Value               Date                       T4FREE                   1.01                07/03/2019                Urine normal       Your cholesterol panel is normal, except LDL Bad cholesterol is  borderline  high .  work on   diet and exercise  see recommendation below   Lab Results       Component                Value               Date                       CHOL                     183                 07/03/2019                 CHOL                     149                 08/06/2015            Lab Results       Component                Value               Date                       HDL                      57                  07/03/2019                 HDL                      55                  08/06/2015  Lab Results       Component                Value               Date                       LDL                      111 (H)             07/03/2019                 LDL                      82                  08/06/2015            Lab Results       Component                Value               Date                       TRIG                     75                  07/03/2019                 TRIG                     62                  08/06/2015                Your goal cholesterol numbers are:   LDL ("bad cholesterol") <100 in normal  <75 in Diabetic   HDL ("good cholesterol") >40  Triglycerides <150    Increase vegetables, fruits, and fiber in your diet.  Increase unsaturated fats - olive oil, canola oil, nuts, seeds, avocados, fish  Decrease saturated and trans fats - red meat, fried foods, butter, foods with hydrogenated oils.  Decreased processed carbohydrates and sugar - crackers, cookies, pretzels, white bread, white rice, regular pasta, etc.  Exercise at least 30 minutes 5 days per  week.    Fish oil or omega-3 supplement (2-4 gm daily) is okay to take and may lower your triglycerides.        Dr [name] MD  Internist  [facility] [place]  [address], [place],  [place] [id number]  [phone]  [phone]

## 2019-07-15 LAB — COLOGUARD: Cologuard Result: NEGATIVE

## 2019-07-16 ENCOUNTER — Inpatient Hospital Stay: Admission: RE | Admit: 2019-07-16 | Payer: Medicaid HMO | Source: Ambulatory Visit

## 2019-07-24 ENCOUNTER — Inpatient Hospital Stay
Admission: RE | Admit: 2019-07-24 | Discharge: 2019-07-24 | Disposition: A | Discharge status: Home / Self Care | Payer: Medicaid HMO | Source: Ambulatory Visit | Attending: Cardiovascular Disease | Admitting: Cardiovascular Disease

## 2019-07-30 ENCOUNTER — Ambulatory Visit: Admission: RE | Admit: 2019-07-30 | Payer: Medicaid HMO | Source: Ambulatory Visit

## 2019-08-05 ENCOUNTER — Ambulatory Visit (INDEPENDENT_AMBULATORY_CARE_PROVIDER_SITE_OTHER): Payer: Medicaid HMO | Admitting: Internal Medicine

## 2019-08-05 ENCOUNTER — Encounter (INDEPENDENT_AMBULATORY_CARE_PROVIDER_SITE_OTHER): Payer: Self-pay | Admitting: Internal Medicine

## 2019-08-05 VITALS — BP 110/76 | HR 92 | Temp 98.2°F | Ht 67.0 in | Wt 170.8 lb

## 2019-08-05 DIAGNOSIS — M25512 Pain in left shoulder: Secondary | ICD-10-CM

## 2019-08-05 DIAGNOSIS — G8929 Other chronic pain: Secondary | ICD-10-CM

## 2019-08-05 DIAGNOSIS — M25511 Pain in right shoulder: Secondary | ICD-10-CM

## 2019-08-05 MED ORDER — NAPROXEN 500 MG PO TABS
500.00 mg | ORAL_TABLET | Freq: Two times a day (BID) | ORAL | 1 refills | Status: AC | PRN
Start: 2019-08-05 — End: 2019-09-04

## 2019-08-05 NOTE — Progress Notes (Signed)
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 No pertinent surgical history.   (Not in a hospital admission)    Current/Home Medications    OMEPRAZOLE MAGNESIUM (PRILOSEC OTC PO)    Take by mouth     Allergies   Allergen Reactions    Gluten      Pt states "white bread"    Ibuprofen Nausea And Vomiting    Percocet [Oxycodone-Acetaminophen]     Tylenol [Acetaminophen] Hives     Tyelnol#3, hives and throw up      Social History     Tobacco Use    Smoking status: Current Every Day Smoker     Packs/day: 0.25     Years: 10.00     Pack years: 2.50     Types: Cigarettes    Smokeless tobacco: Never Used   Substance Use Topics    Alcohol use: Yes     Comment: rarely      Family History   Problem Relation Age of Onset    Diabetes Mother     Hypertension Mother         Review of Systems - History obtained from the patient  General ROS: negative for - chills or fever  Musculoskeletal ROS: positive for - pain in shoulder - bilateral  Neurological ROS: negative for - numbness/tingling or weakness      Vitals:    08/05/19 0938   BP: 110/76   BP Site: Left arm   Patient Position: Sitting   Cuff Size: Large   Pulse: 92   Temp: 98.2 F (36.8 C)   SpO2: 99%   Weight: 77.5 kg (170 lb 12.8 oz)   Height: 1.702 m (5\' 7" )       Mental status - alert, oriented to person, place, and time  HEENT: PERRLA; EOMI; Conjunctiva is clear.   Neck - normal lordosis, no C spine tenderness, no trapezius pain  or spasm neck muscle reported   Painful and limited ROM Both shoulder normal, with extension pain > 90, abduction >90  Normal  internal rotation and adduction  No joint tenderness B/L or bicepital tenderness  Neurological - alert, oriented, normal speech, no focal findings, no weakness of B/L UR     Assessment/Plan:      1. Chronic pain of both shoulders    - Sports Medicine Referral: Lanier Clam, MD  - Referral to Physical Therapy - EXTERNAL  - naproxen (NAPROSYN) 500 MG tablet; Take 1 tablet (500 mg total) by mouth 2 (two) times daily as needed (shoulder pain)  Dispense: 30 tablet; Refill: 1    Body mass index is 26.75 kg/m.          Risk & Benefits of the new medication(s) were explained on the AVS an dverbally       Dr [name] MD  Internist  [facility] - [place]  [address], [address], [place],  [place] [id number]  [phone]  [phone]

## 2019-08-05 NOTE — Patient Instructions (Signed)
Naproxen delayed-release tablets  Brand Name: EC-Naprosyn  What is this medicine?  NAPROXEN (na PROX en) is a non-steroidal anti-inflammatory drug (NSAID). It is used to reduce swelling and to treat pain. This medicine may be used for dental pain, headache, or painful monthly periods. It is also used for painful joint and muscular problems such as arthritis, tendinitis, bursitis, and gout.  How should I use this medicine?  Take this medicine by mouth with a glass of water. Follow the directions on the prescription label. Do not cut, crush or chew this medicine. Take it with food if your stomach gets upset. Try to not lie down for at least 10 minutes after you take it. Take your medicine at regular intervals. Do not take your medicine more often than directed. Long-term, continuous use may increase the risk of heart attack or stroke.  A special MedGuide will be given to you by the pharmacist with each prescription and refill. Be sure to read this information carefully each time.  Talk to your pediatrician regarding the use of this medicine in children. Special care may be needed.  What side effects may I notice from receiving this medicine?  Side effects that you should report to your doctor or health care professional as soon as possible:   black or bloody stools, blood in the urine or vomit   blurred vision   chest pain   difficulty breathing or wheezing   nausea or vomiting   redness, blistering, peeling, or loosening of the skin, including inside the mouth   skin rash, hives, or itching   slurred speech or weakness on one side of the body   swelling of eyelids, throat, lips   unexplained weight gain or swelling   unusually weak or tired   yellowing of eyes or skin  Side effects that usually do not require medical attention (report to your doctor or health care professional if they continue or are bothersome):   constipation   headache   heartburn  What may interact with this medicine?   alcohol    antacids   aspirin   cidofovir   diuretics   lithium   medicines for stomach, or intestine problems, like acid reflux or GERD   methotrexate   other drugs for inflammation like ketorolac or prednisone   pemetrexed   probenecid   sucralfate   warfarin    What if I miss a dose?  If you miss a dose, take it as soon as you can. If it is almost time for your next dose, take only that dose. Do not take double or extra doses.  Where should I keep my medicine?  Keep out of the reach of children.  Store at room temperature between 15 and 30 degrees C (59 and 86 degrees F). Keep container tightly closed. Throw away any unused medicine after the expiration date.  What should I tell my health care provider before I take this medicine?  They need to know if you have any of these conditions:   cigarette smoker   coronary artery bypass graft (CABG) surgery within the past 2 weeks   drink more than 3 alcohol-containing drinks a day   heart disease   high blood pressure   history of stomach bleeding   kidney disease   liver disease   lung or breathing disease, like asthma   an unusual or allergic reaction to naproxen, aspirin, other NSAIDs, other medicines, foods, dyes, or preservatives   pregnant or trying  to get pregnant   breast-feeding  What should I watch for while using this medicine?  Tell your doctor or healthcare provider if your pain does not get better. Talk to your doctor before taking another medicine for pain. Do not treat yourself.  This medicine does not prevent heart attack or stroke. In fact, this medicine may increase the chance of a heart attack or stroke. The chance may increase with longer use of this medicine and in people who have heart disease. If you take aspirin to prevent heart attack or stroke, talk with your doctor or healthcare provider.  This medicine may cause serious skin reactions. They can happen weeks to months after starting the medicine. Contact your healthcare provider  right away if you notice fevers or flu-like symptoms with a rash. The rash may be red or purple and then turn into blisters or peeling of the skin. Or, you might notice a red rash with swelling of the face, lips or lymph nodes in your neck or under your arms.  Do not take other medicines that contain aspirin, ibuprofen, or naproxen with this medicine. Side effects such as stomach upset, nausea, or ulcers may be more likely to occur. Many medicines available without a prescription should not be taken with this medicine.  This medicine can cause ulcers and bleeding in the stomach and intestines at any time during treatment. Do not smoke cigarettes or drink alcohol. These increase irritation to your stomach and can make it more susceptible to damage from this medicine. Ulcers and bleeding can happen without warning symptoms and can cause death.  You may get drowsy or dizzy. Do not drive, use machinery, or do anything that needs mental alertness until you know how this medicine affects you. Do not stand or sit up quickly, especially if you are an older patient. This reduces the risk of dizzy or fainting spells.  This medicine can cause you to bleed more easily. Try to avoid damage to your teeth and gums when you brush or floss your teeth.  NOTE:This sheet is a summary. It may not cover all possible information. If you have questions about this medicine, talk to your doctor, pharmacist, or health care provider. Copyright 2020 [employer]

## 2019-08-07 ENCOUNTER — Ambulatory Visit: Admission: RE | Admit: 2019-08-07 | Payer: Medicaid HMO | Source: Ambulatory Visit

## 2019-08-13 ENCOUNTER — Ambulatory Visit
Admission: RE | Admit: 2019-08-13 | Discharge: 2019-08-13 | Disposition: A | Discharge status: Home / Self Care | Payer: Medicaid HMO | Source: Ambulatory Visit | Attending: Cardiovascular Disease | Admitting: Cardiovascular Disease

## 2019-08-13 DIAGNOSIS — I081 Rheumatic disorders of both mitral and tricuspid valves: Secondary | ICD-10-CM | POA: Insufficient documentation

## 2019-08-13 DIAGNOSIS — R072 Precordial pain: Secondary | ICD-10-CM | POA: Insufficient documentation

## 2019-08-13 DIAGNOSIS — I272 Pulmonary hypertension, unspecified: Secondary | ICD-10-CM | POA: Insufficient documentation

## 2019-08-13 DIAGNOSIS — I517 Cardiomegaly: Secondary | ICD-10-CM | POA: Insufficient documentation

## 2019-08-13 DIAGNOSIS — I878 Other specified disorders of veins: Secondary | ICD-10-CM | POA: Insufficient documentation

## 2019-08-13 DIAGNOSIS — R9431 Abnormal electrocardiogram [ECG] [EKG]: Secondary | ICD-10-CM | POA: Insufficient documentation

## 2019-08-13 DIAGNOSIS — M79605 Pain in left leg: Secondary | ICD-10-CM

## 2019-08-14 ENCOUNTER — Telehealth (INDEPENDENT_AMBULATORY_CARE_PROVIDER_SITE_OTHER): Payer: Self-pay | Admitting: Cardiovascular Disease

## 2019-08-14 ENCOUNTER — Inpatient Hospital Stay: Admission: RE | Admit: 2019-08-14 | Payer: Medicaid Other | Source: Ambulatory Visit

## 2019-08-14 NOTE — Telephone Encounter (Signed)
Echocardiogram results reviewed with patient over phone  1. Left ventricle is not dilated.                 2. Systolic function is normal.                 3. The left atrium is dilated.                 4. MIld mitral valve regurgitation.                 5. Mild to moderate tricuspid valve regurgitation.                 6. There is mild pulmonary hypertension. RVSP 43 mm hg                 7. Ivc is dilated      Follow-up with [name] [name] as scheduled

## 2019-08-17 ENCOUNTER — Telehealth (INDEPENDENT_AMBULATORY_CARE_PROVIDER_SITE_OTHER): Payer: Self-pay

## 2019-08-17 NOTE — Telephone Encounter (Signed)
The patient left a message asking for assistance regarding his CPAP mask.  Called and left voicemail.

## 2019-08-18 ENCOUNTER — Ambulatory Visit (INDEPENDENT_AMBULATORY_CARE_PROVIDER_SITE_OTHER): Payer: Medicaid HMO | Admitting: Internal Medicine

## 2019-08-18 ENCOUNTER — Telehealth (INDEPENDENT_AMBULATORY_CARE_PROVIDER_SITE_OTHER): Payer: Self-pay

## 2019-08-18 ENCOUNTER — Telehealth (INDEPENDENT_AMBULATORY_CARE_PROVIDER_SITE_OTHER): Payer: Self-pay | Admitting: Internal Medicine

## 2019-08-18 NOTE — Telephone Encounter (Signed)
Documentation Only    Patient came in for an appt requesting a letter stating he is exempt from wearing a mask.   Patient is not COVID-19 vaccinated, however he has a heart condition and cannot breath very well.   I called Dr. Sunday Corn office, they will call the patient back to let him know if they will be able to write the letter for him.   Visit with Dr. Konrad Dolores was cancelled

## 2019-08-18 NOTE — Telephone Encounter (Signed)
Received phone call from [place], [place] with Dr Driscilla Moats. Patient requested she call to discuss a letter he is requesting for exemption of wearing a mask due to health reasons. 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     Mina Marble I will submit request, however please let patient know Dr Georgeanna Lea is out of the office for the week.     Message to [facility] NP and JJ.

## 2019-08-18 NOTE — Telephone Encounter (Signed)
I am uncomfortable providing this request. Per last office note 06/22/19 it does not look he has a medical condition that would prevent him form wearing a mask.

## 2019-08-19 NOTE — Telephone Encounter (Signed)
Left message for Mr. [name] [name] to call back regarding follow up to call from PCP office and response to his request.

## 2019-08-21 NOTE — Telephone Encounter (Signed)
Second attempt to outreach [name] [name] regarding his request for mask wearing exemption.   Left message to contact the office for recommendation.  Voice mail greeting did not state patient name.

## 2019-09-09 ENCOUNTER — Encounter (INDEPENDENT_AMBULATORY_CARE_PROVIDER_SITE_OTHER): Payer: Self-pay

## 2019-09-09 DIAGNOSIS — R072 Precordial pain: Secondary | ICD-10-CM | POA: Insufficient documentation

## 2019-09-09 DIAGNOSIS — Z1152 Encounter for screening for COVID-19: Secondary | ICD-10-CM | POA: Insufficient documentation

## 2019-09-09 DIAGNOSIS — M79605 Pain in left leg: Secondary | ICD-10-CM

## 2019-09-09 DIAGNOSIS — R9431 Abnormal electrocardiogram [ECG] [EKG]: Secondary | ICD-10-CM | POA: Insufficient documentation

## 2019-09-18 ENCOUNTER — Other Ambulatory Visit (INDEPENDENT_AMBULATORY_CARE_PROVIDER_SITE_OTHER): Payer: Self-pay | Admitting: Cardiovascular Disease

## 2019-09-18 ENCOUNTER — Encounter (INDEPENDENT_AMBULATORY_CARE_PROVIDER_SITE_OTHER): Payer: Self-pay | Admitting: Cardiovascular Disease

## 2019-09-18 ENCOUNTER — Telehealth (INDEPENDENT_AMBULATORY_CARE_PROVIDER_SITE_OTHER): Payer: 59 | Admitting: Cardiovascular Disease

## 2019-09-18 VITALS — Ht 67.0 in | Wt 173.0 lb

## 2019-09-18 DIAGNOSIS — M79605 Pain in left leg: Secondary | ICD-10-CM

## 2019-09-18 DIAGNOSIS — R931 Abnormal findings on diagnostic imaging of heart and coronary circulation: Secondary | ICD-10-CM

## 2019-09-18 DIAGNOSIS — R079 Chest pain, unspecified: Secondary | ICD-10-CM

## 2019-09-18 NOTE — Progress Notes (Signed)
[facility] C[facility]IOLOG[facility] TELEMEDICINE PROGRESS NOTE    [facility] OFFICE -CARDIOLOGY  [address]  [place] [address]  Dept: [phone]  Dept Fax: [phone]       Patient Name: [name] [name]    Date of Visit:  September 18, 2019  Date of Birth: [birth year]  AGE: 54 y.o.  Medical Record #: [id number]  Requesting Physician: PCP None, MD      CHIEF COMPLAINT:  Follow-up      HISTORY OF PRESENT ILLNESS:  The visit today was conducted via telemedicine due to COVID-19 precautions.  The patient was in their home and was informed and gave verbal consent to proceed.    He is a pleasant 54 y.o. male who is seen today by telemedicine for follow-up.  Patient with a history of chest pain syndrome as well as polysubstance abuse including tobacco use prior heroin and cocaine use per his report he has been abstinent but continues to smoke cigarettes.  He also has a history of reported DVT although it is not documented and a varicosity when he was hospitalized in January his D-dimer was negative.  He had an echocardiogram 08/13/2019 that shows normal left ventricular function left atrial dilatation mild mitral regurgitation mild to moderate tricuspid regurgitation and mild pulmonary hypertension RVSP is 43 mmHg.  When I saw him last I recommended a stress test which there is been some issues with insurance as well as a lower extremity Doppler which he is not moved forward with.  He denies any recent chest pain or shortness of breath.  He had a normal nuclear stress test with Dr. Philip Aspen in 2017      PAST MEDICAL HISTORY: He has a past medical history of Echocardiogram (07/2015, 08/2011), Gallstones, Gastritis, Heart murmur, Heroin abuse, and Syphilis. He has no past surgical history on file.    ALLERGIES:   Allergies   Allergen Reactions    Gluten      Pt states "white bread"    Percocet [Oxycodone-Acetaminophen]     Tylenol [Acetaminophen] Hives     Tyelnol#3, hives and throw  up       MEDICATIONS:   No current outpatient medications on file.        FAMILY HISTORY: family history includes Diabetes in his mother; Hypertension in his mother; Other in his father.    SOCIAL HISTORY: He reports that he has been smoking cigarettes. He has a 2.50 pack-year smoking history. He has never used smokeless tobacco. He reports current alcohol use. He reports current drug use. Drugs: Marijuana and Cocaine.    PHYSICAL EXAMINATION    Vital Signs:   Visit Vitals  Ht 1.702 m (5\' 7" )   Wt 78.5 kg (173 lb)   BMI 27.10 kg/m       Constitutional: Cooperative, alert and oriented, well developed, well nourished, in no acute distress.   Head: normocephalic      Eyes: conjunctivae and lids unremarkable  ENT: No pallor or cyanosis   Psychiatric:  normal memory  Neurological: No gross motor deficits noted, affect appropriate, oriented to time, person and place.      LABS:   Lab Results   Component Value Date    WBC 7.79 07/03/2019    HGB 13.1 07/03/2019    HCT 41.9 07/03/2019    PLT 220 07/03/2019     Lab Results   Component Value Date    GLU 95 07/03/2019    BUN 7.0 (L)  07/03/2019    CREAT 0.8 07/03/2019    NA 137 07/03/2019    K 4.5 07/03/2019    CL 101 07/03/2019    CO2 30 (H) 07/03/2019    AST 16 07/03/2019    ALT 11 07/03/2019     Lab Results   Component Value Date    MG 1.9 08/07/2015    TSH 1.15 07/03/2019    HGBA1C 5.4 07/03/2019    BNP <10 05/30/2019     Lab Results   Component Value Date    CHOL 183 07/03/2019    TRIG 75 07/03/2019    HDL 57 07/03/2019    LDL 111 (H) 07/03/2019             IMPRESSION:   Mr. Gunther is a 54 y.o. male with the following problems:    1. Chest pain syndrome stress test pending  2. Echocardiogram normal LV function mild pulmonary hypertension mild to moderate valvular disease in the tricuspid and mitral valves  3. Polysubstance abuse abstinent from illicit drugs for 4 months per his report  4. Reported history of DVT leg pain negative D-dimer when hospitalized in January lower  extremity ultrasound pending      RECOMMENDATIONS:    Attempt exercise stress test hopefully his insurance will pay for that if that is abnormal we will proceed with stress testing with imaging  Lower extremity Doppler ultrasound particularly in light of his mild pulmonary hypertension however I suspect his pulmonary pretension is more likely due to his tobacco use  Complete tobacco cessation is strongly recommended  Continue current antihypertensive regimen  If above-mentioned studies are unremarkable see him again in 3 months alternatively I will see him sooner as needed                                                 Orders Placed This Encounter   Procedures    Ambulatory referral to Pulmonology    Exercise Stress Test (Office Treadmill)    Office Visit ([facility])       No orders of the defined types were placed in this encounter.        SIGNED:    Lysle Rubens, MD          This note was generated by the Dragon speech recognition and may contain errors or omissions not intended by the user. Grammatical errors, random word insertions, deletions, pronoun errors, and incomplete sentences are occasional consequences of this technology due to software limitations. Not all errors are caught or corrected. If there are questions or concerns about the content of this note or information contained within the body of this dictation, they should be addressed directly with the author for clarification.

## 2019-09-21 ENCOUNTER — Encounter (INDEPENDENT_AMBULATORY_CARE_PROVIDER_SITE_OTHER): Payer: Self-pay

## 2019-09-21 NOTE — Progress Notes (Signed)
Room:         Appt Date/Time:7/27 3pm    54 y.o. male   PCP: Pcp, None, MD  Referring Physician:   [name],     New Patient Follow-Up Post-op   [place] [facility] [place] [facility] Other [facility]     Chief Complaint/HPI: Consult- Varicose veins   Date of Last Visit:     Allergies:   Allergies   Allergen Reactions    Gluten      Pt states "white bread"    Percocet [Oxycodone-Acetaminophen]     Tylenol [Acetaminophen] Hives     Tyelnol#3, hives and throw up          Selected Medications:    Coumadin Xarelto Eliquis Pradaxa Lovenox Other thinner:    Plavix Aspirin Brillinta Effient  Pletal Trental Fish Oil   Insulin Metformin Other Diabetes Atorvastatin Rosuvastatin Other Chol:    MHx:  Stroke / TIA / Seizures HTN / HLD  Diabetes   MI / CAD A-fib / Arrhythmia  COPD / Asthma / (other lung)   Kidney Disease Liver Disease DVT / Coagulopathy   Wounds Spine / other MSK  Cancer        x    Smoker           Former Smoker          Never Smoker    Vital Signs this Visit Pulses  HT  BP R BP L Temp   Rad Ulnar Brach Fem Pop DP PT        R          WT  Pulse R Pulse L SpO2       L          Imaging results:        Plan of Care:

## 2019-09-22 ENCOUNTER — Encounter (INDEPENDENT_AMBULATORY_CARE_PROVIDER_SITE_OTHER): Payer: Self-pay | Admitting: Specialist

## 2019-09-22 ENCOUNTER — Ambulatory Visit (INDEPENDENT_AMBULATORY_CARE_PROVIDER_SITE_OTHER): Payer: 59 | Admitting: Specialist

## 2019-09-22 DIAGNOSIS — I872 Venous insufficiency (chronic) (peripheral): Secondary | ICD-10-CM

## 2019-09-22 NOTE — Progress Notes (Signed)
[facility]    Chief Complaint   Patient presents with    Varicose Veins     LLE with pain          History of Present Illness     [name] [name] is a 53 y.o. male who presents with complaints of pain swelling heaviness tiredness involving the left lower extremity.  This has been going on for many years however recently it has worsened.  Patient has been treated with compression stockings in past.  Denies having any history of deep vein thrombosis ischemic rest pain or claudication.    Past Medical History     Past Medical History:   Diagnosis Date    Echocardiogram 07/2015, 08/2011    Gallstones     Gastritis     Heart murmur     Heroin abuse     Syphilis        Past Surgical History     History reviewed. No pertinent surgical history.    Family History     Family History   Problem Relation Age of Onset    Diabetes Mother     Hypertension Mother     Other Father         Unknown disease       Social History     Social History     Socioeconomic History    Marital status: Single     Spouse name: Not on file    Number of children: Not on file    Years of education: Not on file    Highest education level: Not on file   Occupational History    Not on file   Tobacco Use    Smoking status: Current Every Day Smoker     Packs/day: 0.25     Years: 10.00     Pack years: 2.50     Types: Cigarettes    Smokeless tobacco: Never Used   Haematologist Use: Never assessed   Substance and Sexual Activity    Alcohol use: Yes     Comment: rarely, drinks regularly    Drug use: Yes     Types: Marijuana, Cocaine     Comment: heroin     Sexual activity: Not on file   Other Topics Concern    Not on file   Social History Narrative    Not on file     Social Determinants of Health     Financial Resource Strain:     Difficulty of Paying Living Expenses:    Food Insecurity:     Worried About Programme researcher, broadcasting/film/video in the Last Year:     Barista in the Last Year:    Transportation Needs:     Automotive engineer (Medical):     Lack of Transportation (Non-Medical):    Physical Activity:     Days of Exercise per Week:     Minutes of Exercise per Session:    Stress:     Feeling of Stress :    Social Connections:     Frequency of Communication with Friends and Family:     Frequency of Social Gatherings with Friends and Family:     Attends Religious Services:     Active Member of Clubs or Organizations:     Attends Banker Meetings:     Marital Status:    Intimate Partner Violence:     Fear of Current or  Ex-Partner:     Emotionally Abused:     Physically Abused:     Sexually Abused:        Allergies     Allergies   Allergen Reactions    Gluten      Pt states "white bread"    Percocet [Oxycodone-Acetaminophen]     Tylenol [Acetaminophen] Hives     Tyelnol#3, hives and throw up       Medications     No current outpatient medications on file prior to visit.     No current facility-administered medications on file prior to visit.       Review of Systems     Constitutional: Negative for fevers and chills  Skin: No rash or lesions  Respiratory: Negative for cough, wheezing, or hemoptysis  Cardiovascular: as per HPI  Gastrointestinal: Negative for abdominal pain, nausea, vomiting and diarrhea  Musculoskeletal:  No arthritic symptoms  Genitourinary: Negative for dysuria  All other systems were reviewed and are negative except what is stated in the HPI      Physical Exam     Vitals:    09/22/19 1526   BP: 119/76   Pulse: 82       Body mass index is 25.92 kg/m.    General:  Patient appears their stated age, well-nourished.  Alert and in no apparent distress.  Lungs: Respiratory effort unlabored, chest expansion symmetric.  Cardiac: RRR,  no JVD.   Extremities warm, Right Femoral pulses 2+, Left Femoral 2+, Right popliteal 2+, Left popliteal 2+, Right DP 2+, Left DP 2+, Right PT 2+, Left PT 2+,   Abd: Soft, nondistended, nontender.   NWG:NFAO ROM in all 4 extremities, symmetric varicosities of  left calf with mild swelling  Skin: Color appropriate for race, Skin warm, dry, no gangrene, no non healing ulcers, , no hyperpigmentation, no lipo-dermatosclerosis  Neuro: Good insight and judgment, oriented to person, place, and time CN II-XII intact, gross motor and sensory intact     Labs     CBC:   WBC   Date/Time Value Ref Range Status   07/03/2019 09:23 AM 7.79 3.1 - 9.5 x10 3/uL Final   11/25/2008 12:20 PM 8.64 3.50 - 10.80 /CUMM Final     RBC   Date/Time Value Ref Range Status   07/03/2019 09:23 AM 4.35 4.20 - 5.90 x10 6/uL Final     Hgb   Date/Time Value Ref Range Status   07/03/2019 09:23 AM 13.1 12.5 - 17.1 g/dL Final     Hematocrit   Date/Time Value Ref Range Status   07/03/2019 09:23 AM 41.9 37.6 - 49.6 % Final     MCV   Date/Time Value Ref Range Status   07/03/2019 09:23 AM 96.3 (H) 78.0 - 96.0 fL Final     MCHC   Date/Time Value Ref Range Status   07/03/2019 09:23 AM 31.3 (L) 31 - 35 g/dL Final     RDW   Date/Time Value Ref Range Status   07/03/2019 09:23 AM 14 11.0 - 15.0 % Final     Platelets   Date/Time Value Ref Range Status   07/03/2019 09:23 AM 220 142 - 346 x10 3/uL Final       CMP:   Sodium   Date/Time Value Ref Range Status   07/03/2019 09:23 AM 137 136 - 145 mEq/L Final     Potassium   Date/Time Value Ref Range Status   07/03/2019 09:23 AM 4.5 3.5 - 5.1 mEq/L Final  Chloride   Date/Time Value Ref Range Status   07/03/2019 09:23 AM 101 100 - 111 mEq/L Final     CO2   Date/Time Value Ref Range Status   07/03/2019 09:23 AM 30 (H) 21 - 29 mEq/L Final     Glucose   Date/Time Value Ref Range Status   07/03/2019 09:23 AM 95 70 - 100 mg/dL Final     Comment:     ADA guidelines for diabetes mellitus:  Fasting:  Equal to or greater than 126 mg/dL  Random:   Equal to or greater than 200 mg/dL       BUN   Date/Time Value Ref Range Status   07/03/2019 09:23 AM 7.0 (L) 9 - 28 mg/dL Final     Protein, Total   Date/Time Value Ref Range Status   07/03/2019 09:23 AM 7.3 6.0 - 8.3 g/dL Final     Alkaline  Phosphatase   Date/Time Value Ref Range Status   07/03/2019 09:23 AM 79 38 - 106 U/L Final     AST (SGOT)   Date/Time Value Ref Range Status   07/03/2019 09:23 AM 16 5 - 34 U/L Final     ALT   Date/Time Value Ref Range Status   07/03/2019 09:23 AM 11 0 - 55 U/L Final     Anion Gap   Date/Time Value Ref Range Status   07/03/2019 09:23 AM 6.0 5 - 15 Final     Comment:     Calculated AGAP = Na - (CL + CO2)  Interpret with caution; calculated AGAP may not reflect patient's  true clinical status.         Lipid Panel   Cholesterol   Date/Time Value Ref Range Status   07/03/2019 09:23 AM 183 0 - 199 mg/dL Final     Triglycerides   Date/Time Value Ref Range Status   07/03/2019 09:23 AM 75 34 - 149 mg/dL Final     HDL   Date/Time Value Ref Range Status   07/03/2019 09:23 AM 57 40 - 9,999 mg/dL Final     Comment:     An HDL cholesterol <40 mg/dL is low and constitutes a  coronary heart disease risk factor, and HDL-C>59 mg/dL is  a negative risk factor for CHD.  Ref: American Heart Association; Circulation 2004         Coags:   PT   Date/Time Value Ref Range Status   04/23/2016 08:03 PM 14.0 12.6 - 15.0 sec Final     PT INR   Date/Time Value Ref Range Status   04/23/2016 08:03 PM 1.1 0.9 - 1.1 Final     Comment:     Recommended Ranges for Protime INR:    2.0-3.0 for most medical and surgical thromboembolic states    2.5-3.5 for artificial heart valves  INR result may not represent exact Warfarin dosing level during  the transition period from Heparin to Warfarin therapy.  Recommend close clinical monitoring.       PTT   Date/Time Value Ref Range Status   04/23/2016 08:03 PM 30 23 - 37 sec Final     Comment:     In vivo therapeutic range of heparin (0.3 - 0.7 IU/mL)  correlate with the following APTT times: 64 - 102 seconds.           Diagnostic Imaging     I have reviewed and interpreted the vascular diagnostic imaging with the patient    Assessment and Plan  1. Venous insufficiency of left lower extremity  US Venous Low  Extrem Duplx Dopp Uni Left       [name] [name] is a 54 y.o. male who presents with complaints of pain swelling heaviness tiredness involving the left lower extremity.  This has been going on for many years however recently it has worsened.  Patient has been treated with compression stockings in past.  Denies having any history of deep vein thrombosis ischemic rest pain or claudication.  My impression is that he has symptomatic left lower extremity venous insufficiency.  Therefore I have recommended to the patient to undergo venous duplex ultrasound examination and depending on the findings and we will make the appropriate recommendation.  This was discussed with the patient and has agreed    Dr. Larry Sierras I would like to thank you for allowing me to participate in the care of this patient.    Best regards    Romelle Starcher, MD, FACS, RPVI  [facility]  Chief, Section of Vascular Surgery  [place]  [facility]      This note was generated by the Epic EMR system/ Dragon speech recognition and may contain inherent errors or omissions not intended by the user. Grammatical errors, random word insertions, deletions, pronoun errors and incomplete sentences are occasional consequences of this technology due to software limitations. Not all errors are caught or corrected. If there are questions or concerns about the content of this note or information contained within the body of this dictation they should be addressed directly with the author for clarification

## 2019-09-24 ENCOUNTER — Encounter (INDEPENDENT_AMBULATORY_CARE_PROVIDER_SITE_OTHER): Payer: Self-pay | Admitting: Cardiovascular Disease

## 2019-09-24 ENCOUNTER — Ambulatory Visit (INDEPENDENT_AMBULATORY_CARE_PROVIDER_SITE_OTHER): Payer: 59 | Admitting: Cardiovascular Disease

## 2019-09-24 DIAGNOSIS — R079 Chest pain, unspecified: Secondary | ICD-10-CM

## 2019-09-24 NOTE — Procedures (Signed)
EXERCISE STRESS TEST    [facility] OFFICE -CARDIOLOGY  [address]  [place] [address]  Dept: [phone]  Dept Fax: [phone]     Patient: [name] [name]  Sex: Male   DOB: Apr 26, 1965 (54 y.o.)  MRN:  [id number]     Test Date:  09/24/2019       Interpretation Date: 09/24/2019    Referring Physician: Oneita Hurt None, MD     CLINICIAN: Sharlette Dense, CT  SUPERVISING PROVIDER: Sherlie Ban, MD, [facility]    MEDICATIONS:  Patient currently has no medications in their medication list.    INDICATION: Chest pain    GRADED ECG EXERCISE TEST:    ----- Protocol and Exercise Time -----   Protocol Bruce   Exercise Time (min) 10:22   ----- Heart Rate -----   Resting HR 67 bpm   Maximum HR 174 bpm   METS 11.2 METS   Percent Maximum Predicted HR 105 %    -----Blood Pressure -----   Resting BP 110/26mmHg   Maximum BP 190/90 mmHg     Patient Symptoms: No Symptoms    Reason for end: Fatigue    Resting ECG: Sinus Rhythm   ST Changes: None  Stress ECG: No ischemic changes    Arrhythmias: No Arrhythmias    INTERPRETATION:  1. Normal blood pressure response to exercise.  2. Exercise capacity average for age and gender.  3. No chest pain or ischemic ECG changes with exercise.    CONCLUSIONS:  Normal maximal exercise treadmill test with no clinical or ECG evidence of ischemia.      Results and recommendations discussed with the patient.      INTERPRETED BY: Bertram Millard Paschal Blanton, MD

## 2019-09-30 ENCOUNTER — Encounter (INDEPENDENT_AMBULATORY_CARE_PROVIDER_SITE_OTHER): Payer: Self-pay

## 2019-09-30 NOTE — Progress Notes (Signed)
54 y.o. male [id number]    Appt Date/Time:  10-01-19 1440  PCP: Pcp, None, MD  Referring Physician:    New_Patient Follow_Up Post_op   [facility] LLE VENOUS [facility] Other_Imaging     Chief Complaint/HPI:  LLE VENOUS INSUFF  Date of Last Visit:  09-22-19  Last Visit Recommendations: My impression is that he has symptomatic left lower extremity venous insufficiency.  Therefore I have recommended to the patient to undergo venous duplex ultrasound examination and depending on the findings and we will make the appropriate recommendation.     Allergies   Allergen Reactions    Gluten      Pt states "white bread"    Percocet [Oxycodone-Acetaminophen]     Tylenol [Acetaminophen] Hives     Tyelnol#3, hives and throw up        Selected Medications:    Coumadin Xarelto Eliquis Pradaxa Lovenox Other thinner:    Plavix Aspirin Brillinta Effient  Pletal Trental Fish_Oil   Insulin Metformin Other_Diabetes Atorvastatin Rosuvastatin Other Chol:    MHx:  Stroke / TIA / Seizures HTN / HLD  Diabetes   MI / CAD A_fib / Arrhythmia  COPD / Asthma / (other lung)   Kidney_Disease Liver_Disease DVT / Coagulopathy   Wounds Spine / other MSK  Cancer       X     Smoker           Former Smoker          Never Smoker    Vital Signs this Visit Pulses  HT BP R BP L TEMP   Rad Ulnar Brach Fem Pop DP PT          R          Wt HR R HR L SpO2     L          Imaging results:        Plan of Care:

## 2019-10-01 ENCOUNTER — Ambulatory Visit (INDEPENDENT_AMBULATORY_CARE_PROVIDER_SITE_OTHER): Payer: 59

## 2019-10-01 ENCOUNTER — Ambulatory Visit (INDEPENDENT_AMBULATORY_CARE_PROVIDER_SITE_OTHER): Payer: 59 | Admitting: Specialist

## 2019-10-01 VITALS — BP 131/75 | HR 86 | Ht 68.5 in | Wt 173.0 lb

## 2019-10-01 DIAGNOSIS — I872 Venous insufficiency (chronic) (peripheral): Secondary | ICD-10-CM

## 2019-10-01 DIAGNOSIS — M79605 Pain in left leg: Secondary | ICD-10-CM

## 2019-10-01 NOTE — Progress Notes (Signed)
[facility]    Chief Complaint   Patient presents with    Follow-up         History of Present Illness     [name] [name] is a 54 y.o. male who presents with complaints of pain swelling heaviness tiredness involving the left lower extremity.  This has been going on for many years however recently it has worsened.  Patient has been treated with compression stockings in past.  Denies having any history of deep vein thrombosis ischemic rest pain or claudication.  Patient underwent venous duplex ultrasound examination.    Past Medical History     Past Medical History:   Diagnosis Date    Echocardiogram 07/2015, 08/2011    Gallstones     Gastritis     Heart murmur     Heroin abuse     Syphilis        Past Surgical History     No past surgical history on file.    Family History     Family History   Problem Relation Age of Onset    Diabetes Mother     Hypertension Mother     Other Father         Unknown disease       Social History     Social History     Socioeconomic History    Marital status: Single     Spouse name: Not on file    Number of children: Not on file    Years of education: Not on file    Highest education level: Not on file   Occupational History    Not on file   Tobacco Use    Smoking status: Current Every Day Smoker     Packs/day: 0.25     Years: 10.00     Pack years: 2.50     Types: Cigarettes    Smokeless tobacco: Never Used   Haematologist Use: Never assessed   Substance and Sexual Activity    Alcohol use: Yes     Comment: rarely, drinks regularly    Drug use: Yes     Types: Marijuana, Cocaine     Comment: heroin     Sexual activity: Not on file   Other Topics Concern    Not on file   Social History Narrative    Not on file     Social Determinants of Health     Financial Resource Strain:     Difficulty of Paying Living Expenses:    Food Insecurity:     Worried About Programme researcher, broadcasting/film/video in the Last Year:     Barista in the Last Year:    Transportation  Needs:     Freight forwarder (Medical):     Lack of Transportation (Non-Medical):    Physical Activity:     Days of Exercise per Week:     Minutes of Exercise per Session:    Stress:     Feeling of Stress :    Social Connections:     Frequency of Communication with Friends and Family:     Frequency of Social Gatherings with Friends and Family:     Attends Religious Services:     Active Member of Clubs or Organizations:     Attends Banker Meetings:     Marital Status:    Intimate Partner Violence:     Fear of Current or Ex-Partner:  Emotionally Abused:     Physically Abused:     Sexually Abused:        Allergies     Allergies   Allergen Reactions    Gluten      Pt states "white bread"    Percocet [Oxycodone-Acetaminophen]     Tylenol [Acetaminophen] Hives     Tyelnol#3, hives and throw up       Medications     No current outpatient medications on file prior to visit.     No current facility-administered medications on file prior to visit.       Review of Systems     Constitutional: Negative for fevers and chills  Skin: No rash or lesions  Respiratory: Negative for cough, wheezing, or hemoptysis  Cardiovascular: as per HPI  Gastrointestinal: Negative for abdominal pain, nausea, vomiting and diarrhea  Musculoskeletal:  No arthritic symptoms  Genitourinary: Negative for dysuria  All other systems were reviewed and are negative except what is stated in the HPI      Physical Exam     Vitals:    10/01/19 1425   BP: 131/75   Pulse: 86   SpO2:        Body mass index is 25.92 kg/m.    General:  Patient appears their stated age, well-nourished.  Alert and in no apparent distress.  Lungs: Respiratory effort unlabored, chest expansion symmetric.  Cardiac: RRR,  no JVD.   Extremities warm, Right Femoral pulses 2+, Left Femoral 2+, Right popliteal 2+, Left popliteal 2+, Right DP 2+, Left DP 2+, Right PT 2+, Left PT 2+,   Abd: Soft, nondistended, nontender.   ZOX:WRUE ROM in all 4  extremities, symmetric varicosities of left calf with mild swelling  Skin: Color appropriate for race, Skin warm, dry, no gangrene, no non healing ulcers, , no hyperpigmentation, no lipo-dermatosclerosis  Neuro: Good insight and judgment, oriented to person, place, and time CN II-XII intact, gross motor and sensory intact     Labs     CBC:   WBC   Date/Time Value Ref Range Status   07/03/2019 09:23 AM 7.79 3.1 - 9.5 x10 3/uL Final   11/25/2008 12:20 PM 8.64 3.50 - 10.80 /CUMM Final     RBC   Date/Time Value Ref Range Status   07/03/2019 09:23 AM 4.35 4.20 - 5.90 x10 6/uL Final     Hgb   Date/Time Value Ref Range Status   07/03/2019 09:23 AM 13.1 12.5 - 17.1 g/dL Final     Hematocrit   Date/Time Value Ref Range Status   07/03/2019 09:23 AM 41.9 37.6 - 49.6 % Final     MCV   Date/Time Value Ref Range Status   07/03/2019 09:23 AM 96.3 (H) 78.0 - 96.0 fL Final     MCHC   Date/Time Value Ref Range Status   07/03/2019 09:23 AM 31.3 (L) 31 - 35 g/dL Final     RDW   Date/Time Value Ref Range Status   07/03/2019 09:23 AM 14 11.0 - 15.0 % Final     Platelets   Date/Time Value Ref Range Status   07/03/2019 09:23 AM 220 142 - 346 x10 3/uL Final       CMP:   Sodium   Date/Time Value Ref Range Status   07/03/2019 09:23 AM 137 136 - 145 mEq/L Final     Potassium   Date/Time Value Ref Range Status   07/03/2019 09:23 AM 4.5 3.5 - 5.1 mEq/L Final  Chloride   Date/Time Value Ref Range Status   07/03/2019 09:23 AM 101 100 - 111 mEq/L Final     CO2   Date/Time Value Ref Range Status   07/03/2019 09:23 AM 30 (H) 21 - 29 mEq/L Final     Glucose   Date/Time Value Ref Range Status   07/03/2019 09:23 AM 95 70 - 100 mg/dL Final     Comment:     ADA guidelines for diabetes mellitus:  Fasting:  Equal to or greater than 126 mg/dL  Random:   Equal to or greater than 200 mg/dL       BUN   Date/Time Value Ref Range Status   07/03/2019 09:23 AM 7.0 (L) 9 - 28 mg/dL Final     Protein, Total   Date/Time Value Ref Range Status   07/03/2019 09:23 AM  7.3 6.0 - 8.3 g/dL Final     Alkaline Phosphatase   Date/Time Value Ref Range Status   07/03/2019 09:23 AM 79 38 - 106 U/L Final     AST (SGOT)   Date/Time Value Ref Range Status   07/03/2019 09:23 AM 16 5 - 34 U/L Final     ALT   Date/Time Value Ref Range Status   07/03/2019 09:23 AM 11 0 - 55 U/L Final     Anion Gap   Date/Time Value Ref Range Status   07/03/2019 09:23 AM 6.0 5 - 15 Final     Comment:     Calculated AGAP = Na - (CL + CO2)  Interpret with caution; calculated AGAP may not reflect patient's  true clinical status.         Lipid Panel   Cholesterol   Date/Time Value Ref Range Status   07/03/2019 09:23 AM 183 0 - 199 mg/dL Final     Triglycerides   Date/Time Value Ref Range Status   07/03/2019 09:23 AM 75 34 - 149 mg/dL Final     HDL   Date/Time Value Ref Range Status   07/03/2019 09:23 AM 57 40 - 9,999 mg/dL Final     Comment:     An HDL cholesterol <40 mg/dL is low and constitutes a  coronary heart disease risk factor, and HDL-C>59 mg/dL is  a negative risk factor for CHD.  Ref: American Heart Association; Circulation 2004         Coags:   PT   Date/Time Value Ref Range Status   04/23/2016 08:03 PM 14.0 12.6 - 15.0 sec Final     PT INR   Date/Time Value Ref Range Status   04/23/2016 08:03 PM 1.1 0.9 - 1.1 Final     Comment:     Recommended Ranges for Protime INR:    2.0-3.0 for most medical and surgical thromboembolic states    2.5-3.5 for artificial heart valves  INR result may not represent exact Warfarin dosing level during  the transition period from Heparin to Warfarin therapy.  Recommend close clinical monitoring.       PTT   Date/Time Value Ref Range Status   04/23/2016 08:03 PM 30 23 - 37 sec Final     Comment:     In vivo therapeutic range of heparin (0.3 - 0.7 IU/mL)  correlate with the following APTT times: 64 - 102 seconds.           Diagnostic Imaging     I have reviewed and interpreted the vascular diagnostic imaging with the patient    Assessment and Plan  1. Venous insufficiency  of left lower extremity         [name] [name] is a 54 y.o. male who presents with complaints of pain swelling heaviness tiredness involving the left lower extremity.  This has been going on for many years however recently it has worsened.  Patient has been treated with compression stockings in past.  Denies having any history of deep vein thrombosis ischemic rest pain or claudication.  My impression is that he has symptomatic left lower extremity venous insufficiency.  Patient underwent venous duplex ultrasound examination which revealed evidence of severe reflux in the left greater saphenous and short saphenous vein therefore because of failure of conservative therapy and significant symptoms I have recommended to him to undergo ablation therapy of both veins with subsequent possible microphlebectomy.  This was explained to the patient and has agreed  The risks and benefits of the procedures were explained to the patient and agreed. The risk of infection, bleeding, deep vein thrombosis, pulmonary embolism, nerve injury and other complications were explained in detail and agreed.     Patient understands that symptoms may not completely resolve with this procedure.        Dr. Larry Sierras I would like to thank you for allowing me to participate in the care of this patient.    Best regards    [name] Starcher, MD, FACS, RPVI  [facility]  Chief, Section of Vascular Surgery    [facility]      This note was generated by the Epic EMR system/ Dragon speech recognition and may contain inherent errors or omissions not intended by the user. Grammatical errors, random word insertions, deletions, pronoun errors and incomplete sentences are occasional consequences of this technology due to software limitations. Not all errors are caught or corrected. If there are questions or concerns about the content of this note or information contained within the body of this dictation they should be addressed directly  with the author for clarification

## 2019-10-05 ENCOUNTER — Ambulatory Visit (INDEPENDENT_AMBULATORY_CARE_PROVIDER_SITE_OTHER): Payer: Medicaid HMO | Admitting: Cardiovascular Disease

## 2019-10-14 ENCOUNTER — Telehealth (INDEPENDENT_AMBULATORY_CARE_PROVIDER_SITE_OTHER): Payer: Self-pay

## 2019-10-14 DIAGNOSIS — I872 Venous insufficiency (chronic) (peripheral): Secondary | ICD-10-CM

## 2019-10-14 NOTE — Telephone Encounter (Signed)
-

## 2019-10-14 NOTE — Telephone Encounter (Signed)
-----   Message from Fransico Setters, MD sent at 10/01/2019  3:50 PM EDT -----  L GSV RF  2- L SSV RF  [phone]  C [phone]

## 2019-10-14 NOTE — Telephone Encounter (Signed)
Patient would like come in sooner if insurance authorizes the procedures fast.    Surgeon:  Dr.  Lucianne Muss  Procedure:  1-L GSV  RFA  CPT code:  98119  DX: Venous insufficiency of both legs  - i87.2   Date and Time:    9/30 @  2:45pm- OUTPATIENT  Location:  [facility]  Valium offered:  no  [place] order:  Need  Compression Stockings order: need  RFA packet will be emailed to patient.    Procedure:  2- L SSV RFA  CPT code:  14782  DX: Venous insufficiency of both legs  - i87.2  Date and Time:    10/14  @  9:30am- OUTPATIENT   Location:  [facility]  Valium offered:  no  [place] order:  Need        FAX PACKET TO:  [phone] fax  [facility]  [phone] [place] [address]  [phone]

## 2019-10-16 ENCOUNTER — Encounter (INDEPENDENT_AMBULATORY_CARE_PROVIDER_SITE_OTHER): Payer: Self-pay

## 2019-10-19 NOTE — Telephone Encounter (Signed)
RFA packet faxed to [phone]

## 2019-10-20 ENCOUNTER — Ambulatory Visit (INDEPENDENT_AMBULATORY_CARE_PROVIDER_SITE_OTHER): Payer: 59 | Admitting: Internal Medicine

## 2019-10-20 ENCOUNTER — Telehealth (INDEPENDENT_AMBULATORY_CARE_PROVIDER_SITE_OTHER): Payer: Self-pay | Admitting: Internal Medicine

## 2019-10-20 ENCOUNTER — Encounter (INDEPENDENT_AMBULATORY_CARE_PROVIDER_SITE_OTHER): Payer: Self-pay | Admitting: Internal Medicine

## 2019-10-20 VITALS — BP 111/73 | HR 71 | Temp 97.8°F | Resp 14 | Ht 66.25 in | Wt 171.2 lb

## 2019-10-20 DIAGNOSIS — L819 Disorder of pigmentation, unspecified: Secondary | ICD-10-CM

## 2019-10-20 NOTE — Telephone Encounter (Signed)
Patient came in thinking his appointment was in the morning however its really in the afternoon at 1:45pm, spoke to Dr. Larry Sierras and she cannot see this morning. Patient currently is at [facility] in [place], since he cannot drive per the treatment center they told him he is going to have to stay till he is done with his appointment. So patient will wait here till then.

## 2019-10-20 NOTE — Progress Notes (Signed)
Chief Complaint   Patient presents with    Rash     on the back of shoulder    Acne     break out on head     He  Is currently in [employer]  He reports he gets posterior scalp lesions ?acne when he does not wear his cap and around people and he needs a letter saying that  No active lesions noted today  Also reports to a pigmented spot Rt upper back for few days now, not itchy ? Increased in size    Past Medical History:   Diagnosis Date    Echocardiogram 07/2015, 08/2011    Gallstones     Gastritis     Heart murmur     Heroin abuse     Syphilis       History reviewed. No pertinent surgical history.   (Not in a hospital admission)    Current/Home Medications    No medications on file     Allergies   Allergen Reactions    Gluten      Pt states "white bread"    Percocet [Oxycodone-Acetaminophen]     Tylenol [Acetaminophen] Hives     Tyelnol#3, hives and throw up      Social History     Tobacco Use    Smoking status: Current Every Day Smoker     Packs/day: 0.25     Years: 10.00     Pack years: 2.50     Types: Cigarettes    Smokeless tobacco: Never Used   Substance Use Topics    Alcohol use: Not Currently     Comment: rarely, drinks regularly      Family History   Problem Relation Age of Onset    Diabetes Mother     Hypertension Mother     Other Father         Unknown disease        Review of Systems   Constitutional: Negative for chills and fever.   Skin:        As HPI    Neurological: Negative.      Vitals:    10/20/19 1136   BP: 111/73   Pulse: 71   Resp: 14   Temp: 97.8 F (36.6 C)   SpO2: 100%     Physical Examination: General appearance - alert, well appearing, and in no distress  Mental status - alert, oriented to person, place, and time  Neurological - alert, oriented, normal speech, no focal findings or movement disorder noted  Skin - LESIONS NOTED: pigmented about 2cm coin like solitary lesion Rt upper back flat no scaling   Noted  No lesion else where with similar morphology        Assessment/Plan:      1. Atypical pigmented skin lesion    - Ambulatory referral to Dermatology    Body mass index is 27.42 kg/m.          Dr [name] MD  Internist  [facility] [place]  [address], [place],  [place] [id number]  [phone]  Fax-[phone]

## 2019-10-20 NOTE — Progress Notes (Signed)
Have you seen any specialists/other providers since your last visit with [place]?    Yes    Arm preference verified?   Yes    The patient is due for colonoscopy, influenza vaccine, shingles vaccine, pneumonia vaccine and Covid vaccine, Advance directive.

## 2019-11-17 ENCOUNTER — Telehealth (INDEPENDENT_AMBULATORY_CARE_PROVIDER_SITE_OTHER): Payer: Self-pay

## 2019-11-17 NOTE — Telephone Encounter (Signed)
Patient returned call to [facility] office scheduler about sending over scheduled appt info. Patient transferred to [facility].

## 2019-11-24 ENCOUNTER — Other Ambulatory Visit (INDEPENDENT_AMBULATORY_CARE_PROVIDER_SITE_OTHER): Payer: Self-pay

## 2019-11-24 DIAGNOSIS — I872 Venous insufficiency (chronic) (peripheral): Secondary | ICD-10-CM

## 2019-11-25 ENCOUNTER — Telehealth (INDEPENDENT_AMBULATORY_CARE_PROVIDER_SITE_OTHER): Payer: Self-pay

## 2019-11-25 NOTE — Telephone Encounter (Signed)
[facility]  Pre-Op Instructions        Procedure Information      Date:  11-26-19 1015  Number of Procedures: 1 OF 2  Vein :  LEFT GREAT SAPHENOUS  Diameter:  0.45 cm     Past Medical History     Past Medical History:   Diagnosis Date    Echocardiogram 07/2015, 08/2011    Gallstones     Gastritis     Heart murmur     Heroin abuse     Syphilis          Past Surgical History   No past surgical history on file.      Allergies   Gluten, Percocet [oxycodone-acetaminophen], and Tylenol [acetaminophen]      Medications     No current outpatient medications on file prior to visit.     No current facility-administered medications on file prior to visit.         Instuctions      Location of Procedure: [facility]     Arrive 30 Prior to Procedure: Patient informed     Compression Stockings: Patient informed to bring day of procedure     Sedative Valium: N/A     NPO: N/A     Medications: Reviewed     Responsible Adult for Driving Patient After Procedure: Pt plans to drive self.

## 2019-11-26 ENCOUNTER — Ambulatory Visit (INDEPENDENT_AMBULATORY_CARE_PROVIDER_SITE_OTHER): Payer: 59 | Admitting: Specialist

## 2019-11-26 ENCOUNTER — Encounter (HOSPITAL_BASED_OUTPATIENT_CLINIC_OR_DEPARTMENT_OTHER): Payer: Self-pay | Admitting: Specialist

## 2019-11-26 VITALS — BP 125/87 | HR 75 | Temp 97.9°F | Wt 171.0 lb

## 2019-11-26 DIAGNOSIS — I872 Venous insufficiency (chronic) (peripheral): Secondary | ICD-10-CM

## 2019-11-26 DIAGNOSIS — I8312 Varicose veins of left lower extremity with inflammation: Secondary | ICD-10-CM

## 2019-11-26 DIAGNOSIS — I83892 Varicose veins of left lower extremities with other complications: Secondary | ICD-10-CM

## 2019-11-26 DIAGNOSIS — I83812 Varicose veins of left lower extremities with pain: Secondary | ICD-10-CM

## 2019-11-26 NOTE — Progress Notes (Signed)
[facility]  RFA Procedure      PREOP:     Allergies:  Allergies   Allergen Reactions    Gluten      Pt states "white bread"    Percocet [Oxycodone-Acetaminophen]     Tylenol [Acetaminophen] Hives     Tyelnol#3, hives and throw up       Medications and allergies reviewed: YES    Vitals:   Visit Vitals  BP 125/87 (BP Site: Right arm, Patient Position: Sitting, Cuff Size: Medium)   Pulse 75   Temp 97.9 F (36.6 C) (Temporal)   Wt 77.6 kg (171 lb)   SpO2 97%   BMI 27.39 kg/m       Does patient have compression stockings 20-30 mmHg: NO    Consent signed:YES    Witness:SHIRLEY ARANA, LPN    Valium Dosage: NA Time Given after consent:NA    Driver: SELF    TIME OUT: 10:37 AM        PROCEDURE:     Procedure: Venous Insufficiency of the LEFT GREATER Saphenous Vein with maximum diameter of 0.45 cm.    Surgeon: Dr. Lucianne Muss    Nurse: Marguarite Arbour, RN   Medical Assistant: Alvino Blood, MA    Ultrasound tech: N/A    Position:Supine    Chlora-prep site: L LEG    Procedure start time: 10:38 AM      Tumescent Solution:450   ml     1% Lidocaine: 4 ml    Treatment Length: 52 cm    Radiofrequency Usage: Time: 5 mins, RF Cycles: 15, Watts: 11    Notes: 7 x 60  cm Ablation RFA Catheter    Patient cleaned up, bandages placed, compression hose applied and assisted to post op room    Procedure End Time: 10:54 AM        The contents of Tumescent Solution:  Sodium Chloride .09%   Lidocaine  1%  50ml 500mg   Sodium Bicarbonate 8.4% 15ml  For Subcutaneous Infusion Only by Surgeon  Prepared by Marguarite Arbour, RN  Checked by Alvino Blood, MA  Todays date November 26, 2019  Expiration time 11 AM  *medication expires one hour after mixing  POST PROCEDURE:     BP: 142/88 HR: 78 Pulse ox: 98  Pain score 0    Notes:     Patient tolerated procedure well, no adverse effects,pulses present, no hematomas, bandages and compression hose in place.    Post op teaching done per protocol, patient verbalized understanding, patient escorted to waiting  area.    Time Discharge:11:06 am  Nurse: Marguarite Arbour, RN

## 2019-11-26 NOTE — Procedures (Signed)
[facility]  Radiofrequency Ablation Procedure        PREOPERATIVE DIAGNOSIS: Symptomatic Venous Insufficiency     POSTOPERATIVE DIAGNOSIS: Venous Insufficiency of the left greater saphenous system.    SURGEON: Fransico Setters, MD    HPI: The patient is a 54 y.o. year male who presented with a history of lower extremity symptomatic venous insufficiency, and progressively worsening varicose veins associated with pain, swelling and inflammation.     Conservative treatment, including at least 3 months of compression stocking did not improve their symptoms.     Real-time color flow duplex ultrasound examination, demonstrated significant incompetence of the left greater saphenous system.     The left greater saphenous vein is enlarged in its entire course with a maximum diameter of 0.65 cm.     Surgical pause performed. No concerns were voiced by team members.      Procedure: Endovascular Radiofrequency Ablation of the left greater saphenous Vein    The patient was placed in the Supine position, and the area of treatment was prepped and draped in the usual sterile fashion. Local anesthesia (1% Lidocaine) was used. A skin incision was made overlying the identified and mapped left greater saphenous vein entry site. The vein was accessed using ultrasound guidance and the Seldinger Technique, a guide wire was introduced through the needle, which was then exchanged over the wire for a 31F sheath, which was secured in place. The guide wire was removed and the sheath was flushed. The RF catheter was placed into the vein through the sheath and preferentially, imaging was used to place the catheter tip just inferior to the superficial epigastric vein to preserve normal physiological flow in the vein. Additionally, it was confirmed by ultrasound guidance that the catheter tip was also placed a minimum of 2.0 cm distal to the sapheno femoral junction. After the RF catheter position was verified by ultrasound, tumescent  anesthesia was infiltrated, under ultrasound guidance, precisely into the perivenous compartment along the entire length of vein from the entry site to the sapheno femoral junction until a "halo" of fluid was noted around the vein. After the RF catheter position was again confirmed with ultrasound imaging, and under external compression along the length of the heating element, RF energy was applied. The vein was segmentally ablated by heating a 6 cm segment and then indexing the catheter forward by 6 cm until the treatment length is completed. Device temperature was maintained at 1205 C Celsius with an initial power level of 40W dropping to below 20W for each treatment. Total vein length 52 cm and total of 15 RF Cycles.      The catheter and sheath were withdrawn and hemostasis established with direct pressure. After assuring hemostasis, the entry site was closed with skin adhesive and  a compression wrap, and/or graduated compression stocking was applied from the level of the foot to the most proximal level of the thigh.       Electronically Signed: Fransico Setters, MD

## 2019-11-27 ENCOUNTER — Telehealth (INDEPENDENT_AMBULATORY_CARE_PROVIDER_SITE_OTHER): Payer: Self-pay

## 2019-11-27 ENCOUNTER — Ambulatory Visit (INDEPENDENT_AMBULATORY_CARE_PROVIDER_SITE_OTHER): Payer: 59

## 2019-11-27 DIAGNOSIS — I872 Venous insufficiency (chronic) (peripheral): Secondary | ICD-10-CM

## 2019-11-27 NOTE — Telephone Encounter (Signed)
-  Return call received from Pt requesting compression stocking order be faxed to [facility]/[phone] as this pharmacy works with the program he is a part of   -Faxed order per Pt request, he had no further questions or concerns at this time.

## 2019-11-27 NOTE — Telephone Encounter (Signed)
-  Call received from Pt asking if compression stocking order can be faxed to [facility] pharmacy in [place] as this is closest to him  -Doctor, general practice pharmacy/[phone] for fax number and pharmacist advised that Pt can obtain this OTC at [facility], he advised once Pt comes to pharmacy they will direct him of where to go

## 2019-11-30 ENCOUNTER — Encounter (INDEPENDENT_AMBULATORY_CARE_PROVIDER_SITE_OTHER): Payer: Self-pay | Admitting: Specialist

## 2019-11-30 ENCOUNTER — Telehealth (INDEPENDENT_AMBULATORY_CARE_PROVIDER_SITE_OTHER): Payer: Self-pay

## 2019-11-30 NOTE — Telephone Encounter (Addendum)
-   [facility] and spoke with RN Aram Beecham. The contact info for [facility]) is T: [phone], F: [phone].   - Compression stocking order faxed to number provided, but pharmacist says it doesn't have a physician's signature.   - Verbal order given to pharmacist Katrina.

## 2019-11-30 NOTE — Telephone Encounter (Signed)
-   Pt left VM stating that [facility] does not have compression stocking order faxed by our office on 10/01.   - Faxed order again to [phone]. No answer.   - Called pt for contact info to [facility]. Pt states he is in an adult program in [place], [place] and provided the RN's name Aram Beecham) who was told the fax was not received.   Jeanene Erb [phone] [facility] to speak with RN Aram Beecham. No answer received. Unable to leave VM.

## 2019-12-03 ENCOUNTER — Encounter (INDEPENDENT_AMBULATORY_CARE_PROVIDER_SITE_OTHER): Payer: Self-pay | Admitting: Cardiovascular Disease

## 2019-12-03 ENCOUNTER — Ambulatory Visit (INDEPENDENT_AMBULATORY_CARE_PROVIDER_SITE_OTHER): Payer: 59 | Admitting: Cardiovascular Disease

## 2019-12-03 VITALS — BP 140/80 | HR 78 | Ht 68.5 in | Wt 171.0 lb

## 2019-12-03 DIAGNOSIS — R079 Chest pain, unspecified: Secondary | ICD-10-CM

## 2019-12-03 DIAGNOSIS — R931 Abnormal findings on diagnostic imaging of heart and coronary circulation: Secondary | ICD-10-CM

## 2019-12-03 NOTE — Progress Notes (Signed)
[facility] TELEMEDICINE PROGRESS NOTE    [facility] OFFICE -CARDIOLOGY  [address]  [place] [address]  Dept: [phone]  Dept Fax: [phone]       Patient Name: [name] [name]    Date of Visit:  December 03, 2019  Date of Birth: 08/08/1965  AGE: 54 y.o.  Medical Record #: [id number]  Requesting Physician: PCP None, MD    [name] [name] presents for scheduled follow up.  He is a 54 y.o. M recently seen by Dr. Georgeanna Lea.  History of chest pain syndrome as well as polysubstance abuse including tobacco use prior heroin and cocaine use.  Per his report he has been abstinent but continues to smoke cigarettes.  He also has a history of reported DVT.  Not PE.  He had an echocardiogram 08/13/2019 that showed normal left ventricular function left atrial dilatation mild mitral regurgitation mild to moderate tricuspid regurgitation and mild pulmonary hypertension RVSP is 43 mmHg.  At his last visit with Dr. Georgeanna Lea he was arranged for repeat stress testing (ETT).  He had a normal nuclear stress test with Dr. Philip Aspen in 2017    Since his last visit with Dr. Georgeanna Lea he had the ETT.  10:22.  No EKG changes.  Normal study.  He is feeling pretty well.  BP okay today.      PAST MEDICAL HISTORY: He has a past medical history of Echocardiogram (07/2015, 08/2011), Gallstones, Gastritis, Heart murmur, Heroin abuse, and Syphilis. He has no past surgical history on file.    ALLERGIES:   Allergies   Allergen Reactions    Gluten      Pt states "white bread"    Percocet [Oxycodone-Acetaminophen]     Tylenol [Acetaminophen] Hives     Tyelnol#3, hives and throw up       MEDICATIONS:   No current outpatient medications on file.        FAMILY HISTORY: family history includes Diabetes in his mother; Hypertension in his mother; Other in his father.    SOCIAL HISTORY: He reports that he has been smoking cigarettes. He has a 2.50 pack-year smoking history. He has never used  smokeless tobacco. He reports previous alcohol use. He reports previous drug use. Drugs: Marijuana and Cocaine.    PHYSICAL EXAMINATION    Vital Signs:   Visit Vitals  BP 140/80 (BP Site: Right arm, Patient Position: Sitting, Cuff Size: Medium)   Pulse 78   Ht 1.74 m (5' 8.5")   Wt 77.6 kg (171 lb)   BMI 25.62 kg/m       Constitutional: Cooperative, alert and oriented, well developed, well nourished, in no acute distress.   Head: normocephalic      Eyes: conjunctivae and lids unremarkable  ENT: No pallor or cyanosis   Psychiatric:  normal memory  Neurological: No gross motor deficits noted, affect appropriate, oriented to time, person and place.      LABS:   Lab Results   Component Value Date    WBC 7.79 07/03/2019    HGB 13.1 07/03/2019    HCT 41.9 07/03/2019    PLT 220 07/03/2019     Lab Results   Component Value Date    GLU 95 07/03/2019    BUN 7.0 (L) 07/03/2019    CREAT 0.8 07/03/2019    NA 137 07/03/2019    K 4.5 07/03/2019    CL 101 07/03/2019    CO2 30 (H) 07/03/2019    AST  16 07/03/2019    ALT 11 07/03/2019     Lab Results   Component Value Date    MG 1.9 08/07/2015    TSH 1.15 07/03/2019    HGBA1C 5.4 07/03/2019    BNP <10 05/30/2019     Lab Results   Component Value Date    CHOL 183 07/03/2019    TRIG 75 07/03/2019    HDL 57 07/03/2019    LDL 111 (H) 07/03/2019             IMPRESSION:    Chest pain syndrome.  2021 ETT to > 10 min without chest pain or ischemic EKG changes.     Echocardiogram 2021 normal LV function, TR, mild pulmonary hypertension.   Polysubstance abuse, abstinent from illicit drugs for months per his report.   Reported history of DVT. leg pain. negative D-dimer.   Smoking.     RECOMMENDATIONS:  Regarding his chest pains I am reassured by the recent exercise treadmill test on the prior nuclear stress test.  Regarding mild pulmonary artery hypertension I do suspect related to smoking although he does have the prior DVT and potentially could have had prior pulmonary embolus.  He does  not describe symptoms of sleep apnea.  I will make no changes today.  He will continue to work on smoking cessation.  I made sure he knew that if he needs help we would be happy to support him through our smoking cessation clinic.  I will have him return to see Dr. Georgeanna Lea in 6 months.  Sooner if chest pains recur or worsen.                                               Orders Placed This Encounter   Procedures    ECG 12 lead (Normal)       No orders of the defined types were placed in this encounter.        SIGNED:    Bertram Millard Saeed Toren, MD          This note was generated by the Dragon speech recognition and may contain errors or omissions not intended by the user. Grammatical errors, random word insertions, deletions, pronoun errors, and incomplete sentences are occasional consequences of this technology due to software limitations. Not all errors are caught or corrected. If there are questions or concerns about the content of this note or information contained within the body of this dictation, they should be addressed directly with the author for clarification.

## 2019-12-09 ENCOUNTER — Telehealth (INDEPENDENT_AMBULATORY_CARE_PROVIDER_SITE_OTHER): Payer: Self-pay

## 2019-12-09 NOTE — Telephone Encounter (Signed)
[name] [name]  Pre-Op Instructions        Procedure Information      Date:  12/10/19  Number of Procedures: 2 of 2  Vein :  Left Short Saphenous vein  Diameter:  1.42 cm     Past Medical History     Past Medical History:   Diagnosis Date    Echocardiogram 07/2015, 08/2011    Gallstones     Gastritis     Heart murmur     Heroin abuse     Syphilis          Past Surgical History   No past surgical history on file.      Allergies   Gluten, Percocet [oxycodone-acetaminophen], and Tylenol [acetaminophen]      Medications     No current outpatient medications on file prior to visit.     No current facility-administered medications on file prior to visit.         Instuctions      Location of Procedure: [facility]     Arrive 30 Prior to Procedure: Patient informed     Compression Stockings: Patient informed to bring day of procedure     Sedative Valium: N/A     NPO: N/A     Medications: Reviewed     Responsible Adult for Driving Patient After Procedure: Pt plans to drive self.

## 2019-12-10 ENCOUNTER — Encounter (HOSPITAL_BASED_OUTPATIENT_CLINIC_OR_DEPARTMENT_OTHER): Payer: Self-pay | Admitting: Specialist

## 2019-12-10 ENCOUNTER — Ambulatory Visit (INDEPENDENT_AMBULATORY_CARE_PROVIDER_SITE_OTHER): Payer: 59 | Admitting: Specialist

## 2019-12-10 VITALS — BP 138/84 | HR 74 | Ht 68.0 in | Wt 173.0 lb

## 2019-12-10 DIAGNOSIS — I872 Venous insufficiency (chronic) (peripheral): Secondary | ICD-10-CM

## 2019-12-10 DIAGNOSIS — I83892 Varicose veins of left lower extremities with other complications: Secondary | ICD-10-CM

## 2019-12-10 NOTE — Progress Notes (Signed)
[facility]  RFA Procedure      PREOP:     Allergies:  Allergies   Allergen Reactions    Gluten      Pt states "white bread"    Percocet [Oxycodone-Acetaminophen]     Tylenol [Acetaminophen] Hives     Tyelnol#3, hives and throw up       Medications and allergies reviewed:YES    Vitals:   Visit Vitals  BP 121/78 (BP Site: Left arm, Patient Position: Sitting, Cuff Size: Medium)   Pulse 69   Ht 1.727 m (5\' 8" )   Wt 78.5 kg (173 lb)   BMI 26.30 kg/m       Does patient have compression stockings 20-30 mmHg: YES    Consent signed:YES    Witness:YES    Valium Dosage: NA Time Given after consent:NA    Driver: SELF    TIME OUT: 9:51 AM    PROCEDURE:     Procedure: Venous Insufficiency of the LEFT SHORT Saphenous Vein with maximum diameter of 1.42 cm.    Surgeon: Dr. Lucianne Muss    Nurse: Marguarite Arbour, RN, Lockie Pares, LPN   Medical Assistant: NA    Ultrasound tech: N/A    Position:Prone    Chlora-prep site: L LEG    Procedure start time: 9:51 AM    Tumescent Solution:100 ml     1% Lidocaine: 2 ml    Treatment Length: 22 cm    Radiofrequency Usage: Time: 2:40 mins, RF Cycles: 8, Watts: 7    Notes: 7 x 60  cm Ablation RFA Catheter    Patient cleaned up, bandages placed, compression hose applied and assisted to post op room    Procedure End Time: 10:06 AM        The contents of Tumescent Solution:  Sodium Chloride .09%   Lidocaine  1%  50ml 500mg   Sodium Bicarbonate 8.4% 15ml  For Subcutaneous Infusion Only by Surgeon  Prepared by Marguarite Arbour, RN  Checked by Lockie Pares, LPN  Todays date December 10, 2019  Expiration time 10:15 AM  *medication expires one hour after mixing  POST PROCEDURE:     BP: 128/81 HR: 70 Pulse ox: 98  Pain score 0    Notes:     Patient tolerated procedure well, no adverse effects,pulses present, no hematomas, bandages and compression hose in place.    Post op teaching done per protocol, patient verbalized understanding, patient escorted to waiting area.    Time Discharge:10:18 am  Nurse:  Marguarite Arbour, RN

## 2019-12-10 NOTE — Procedures (Signed)
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    SURGEON: Fransico Setters, MD    HPI: The patient is a 54 y.o. year male who presented with a history of lower extremity symptomatic venous insufficiency, and progressively worsening varicose veins associated with pain, swelling and inflammation.     Conservative treatment, including at least 3 months of compression stocking did not improve their symptoms.     Real-time color flow duplex ultrasound examination, demonstrated significant incompetence of the left short saphenous system.     The left short saphenous vein is enlarged in its entire course with a maximum diameter of 1.4 cm.     Surgical pause performed. No concerns were voiced by team members.      Procedure: Endovascular Radiofrequency Ablation of the left short saphenous Vein    The patient was placed in the Prone position, and the area of treatment was prepped and draped in the usual sterile fashion. Local anesthesia (1% Lidocaine) was used. A skin incision was made overlying the identified and mapped left short saphenous vein entry site. The vein was accessed using ultrasound guidance and the Seldinger Technique, a guide wire was introduced through the needle, which was then exchanged over the wire for a 24F sheath, which was secured in place. The guide wire was removed and the sheath was flushed. The RF catheter was placed into the vein through the sheath and preferentially, imaging was used to place the catheter tip just inferior to the superficial epigastric vein to preserve normal physiological flow in the vein. Additionally, it was confirmed by ultrasound guidance that the catheter tip was also placed a minimum of 2.0 cm distal to the sapheno popliteal junction. After the RF catheter position was verified by ultrasound, tumescent anesthesia was  infiltrated, under ultrasound guidance, precisely into the perivenous compartment along the entire length of vein from the entry site to the sapheno popliteal junction until a "halo" of fluid was noted around the vein. After the RF catheter position was again confirmed with ultrasound imaging, and under external compression along the length of the heating element, RF energy was applied. The vein was segmentally ablated by heating a 6 cm segment and then indexing the catheter forward by 6 cm until the treatment length is completed. Device temperature was maintained at 1205 C Celsius with an initial power level of 40W dropping to below 20W for each treatment. Total vein length 22 cm and total of 8 RF Cycles.      The catheter and sheath were withdrawn and hemostasis established with direct pressure. After assuring hemostasis, the entry site was closed with skin adhesive and  a compression wrap, and/or graduated compression stocking was applied from the level of the foot to the most proximal level of the thigh.       Electronically Signed: Fransico Setters, MD

## 2019-12-11 ENCOUNTER — Encounter (INDEPENDENT_AMBULATORY_CARE_PROVIDER_SITE_OTHER): Payer: Self-pay | Admitting: Specialist

## 2019-12-11 ENCOUNTER — Encounter (HOSPITAL_BASED_OUTPATIENT_CLINIC_OR_DEPARTMENT_OTHER): Payer: Self-pay | Admitting: Specialist

## 2019-12-11 ENCOUNTER — Ambulatory Visit (HOSPITAL_BASED_OUTPATIENT_CLINIC_OR_DEPARTMENT_OTHER): Payer: Medicaid Other

## 2019-12-11 ENCOUNTER — Other Ambulatory Visit (INDEPENDENT_AMBULATORY_CARE_PROVIDER_SITE_OTHER): Payer: Self-pay

## 2019-12-11 DIAGNOSIS — I872 Venous insufficiency (chronic) (peripheral): Secondary | ICD-10-CM

## 2019-12-18 ENCOUNTER — Encounter (INDEPENDENT_AMBULATORY_CARE_PROVIDER_SITE_OTHER): Payer: Self-pay | Admitting: Specialist

## 2019-12-18 ENCOUNTER — Telehealth (INDEPENDENT_AMBULATORY_CARE_PROVIDER_SITE_OTHER): Payer: Self-pay

## 2019-12-18 NOTE — Telephone Encounter (Signed)
Pt had LSSV RFA on 12/10/19 w/ Dr Lucianne Muss and having soreness in his leg calling requesting note for work to avoid strenuous work for 1/5 weeks and faxed to [phone]

## 2019-12-21 ENCOUNTER — Telehealth (INDEPENDENT_AMBULATORY_CARE_PROVIDER_SITE_OTHER): Payer: Self-pay

## 2019-12-21 DIAGNOSIS — I872 Venous insufficiency (chronic) (peripheral): Secondary | ICD-10-CM

## 2019-12-21 DIAGNOSIS — M79605 Pain in left leg: Secondary | ICD-10-CM

## 2019-12-21 NOTE — Telephone Encounter (Signed)
Pt had LGSV RFA on 11/26/19 w Dr Lucianne Muss and LSSV on 12/10/19. Pt is calling to report he is having continued pain in L leg with slight swelling and is taking Ibuprophen but admitted not taking regularly and is wearing compression stocking and requesting to be seen.    Venous duplex order entered into Epic to r/o DVT and message to schedulers that if DVT '+' pt is to be seen by NP Kara Mead.

## 2019-12-22 ENCOUNTER — Ambulatory Visit (INDEPENDENT_AMBULATORY_CARE_PROVIDER_SITE_OTHER): Payer: 59 | Admitting: Family

## 2019-12-22 ENCOUNTER — Encounter (INDEPENDENT_AMBULATORY_CARE_PROVIDER_SITE_OTHER): Payer: Self-pay | Admitting: Family

## 2019-12-22 ENCOUNTER — Ambulatory Visit (INDEPENDENT_AMBULATORY_CARE_PROVIDER_SITE_OTHER): Payer: 59

## 2019-12-22 VITALS — BP 126/85 | HR 77 | Ht 68.0 in | Wt 173.0 lb

## 2019-12-22 DIAGNOSIS — I872 Venous insufficiency (chronic) (peripheral): Secondary | ICD-10-CM

## 2019-12-22 DIAGNOSIS — M79662 Pain in left lower leg: Secondary | ICD-10-CM

## 2019-12-22 DIAGNOSIS — M79605 Pain in left leg: Secondary | ICD-10-CM

## 2019-12-24 ENCOUNTER — Encounter (INDEPENDENT_AMBULATORY_CARE_PROVIDER_SITE_OTHER): Payer: Self-pay | Admitting: Family

## 2019-12-24 NOTE — Progress Notes (Signed)
[facility]    Chief Complaint   Patient presents with    Follow-up     Left lower extremity pain.       History of Present Illness     [name] [name] is a 54 y.o. male who presents to the office for urgent evaluation.  The called the office yesterday complaining of pain, swelling and discomfort involving the left lower extremity 2 weeks after undergoing RFA on the left.. The patient was brought in for venous duplex ultrasound to rule out acute DVT.  The patient is S/P endovascular radiofrequency ablation of left short saphenous vein on 12/10/19 after undergoing  left great saphenous vein 11/26/19. Both procedures were performed by Dr. Lucianne Muss for progressively worsening of varicose veins associated with pain and discomfort. Ultrasound after those 2 procedures demonstrated that those vessels were successfully ablated and no evidence of  DVT found.    Patient has been using compression stockings, walking and has decreased phsiycal activities since the procedure. He endorsed has not taken any NSAID as recommended for post op inflammation and discomfort.   Today, he continues to complain of pulling sensation at the left calf, soreness and numbness of the left lower leg. He denies claudication and rest pain. Also denies redness and warmth.  Venous duplex performed today in our vascular lab was reviewed with patient.    Past Medical History     Past Medical History:   Diagnosis Date    Echocardiogram 07/2015, 08/2011    Gallstones     Gastritis     Heart murmur     Heroin abuse     Syphilis        Past Surgical History     History reviewed. No pertinent surgical history.    Family History     Family History   Problem Relation Age of Onset    Diabetes Mother     Hypertension Mother     Other Father         Unknown disease       Social History     Social History     Socioeconomic History    Marital status: Single     Spouse name: Not on file    Number of children: Not on file    Years of education:  Not on file    Highest education level: Not on file   Occupational History    Not on file   Tobacco Use    Smoking status: Current Some Day Smoker     Packs/day: 0.25     Years: 10.00     Pack years: 2.50     Types: Cigarettes    Smokeless tobacco: Never Used   Haematologist Use: Never used   Substance and Sexual Activity    Alcohol use: Not Currently     Comment: rarely, drinks regularly    Drug use: Not Currently     Types: Marijuana, Cocaine     Comment: heroin     Sexual activity: Not on file   Other Topics Concern    Not on file   Social History Narrative    Not on file     Social Determinants of Health     Financial Resource Strain:     Difficulty of Paying Living Expenses: Not on file   Food Insecurity:     Worried About Running Out of Food in the Last Year: Not on file    Ran  Out of Food in the Last Year: Not on file   Transportation Needs:     Lack of Transportation (Medical): Not on file    Lack of Transportation (Non-Medical): Not on file   Physical Activity:     Days of Exercise per Week: Not on file    Minutes of Exercise per Session: Not on file   Stress:     Feeling of Stress : Not on file   Social Connections:     Frequency of Communication with Friends and Family: Not on file    Frequency of Social Gatherings with Friends and Family: Not on file    Attends Religious Services: Not on file    Active Member of Clubs or Organizations: Not on file    Attends Banker Meetings: Not on file    Marital Status: Not on file   Intimate Partner Violence:     Fear of Current or Ex-Partner: Not on file    Emotionally Abused: Not on file    Physically Abused: Not on file    Sexually Abused: Not on file   Housing Stability:     Unable to Pay for Housing in the Last Year: Not on file    Number of Places Lived in the Last Year: Not on file    Unstable Housing in the Last Year: Not on file       Allergies     Allergies   Allergen Reactions    Gluten      Pt states  "white bread"    Percocet [Oxycodone-Acetaminophen]     Tylenol [Acetaminophen] Hives     Tyelnol#3, hives and throw up       Medications     No current outpatient medications on file prior to visit.     No current facility-administered medications on file prior to visit.       Review of Systems     Constitutional: Negative for fevers and chills  Skin: No rash or lesions  Respiratory: Negative for cough, wheezing, or hemoptysis  Cardiovascular: as per HPI  Gastrointestinal: Negative for abdominal pain, nausea, vomiting and diarrhea  Musculoskeletal:  No arthritic symptoms  Genitourinary: Negative for dysuria  All other systems were reviewed and are negative except what is stated in the HPI      Physical Exam     Vitals:    12/22/19 1607   BP: 126/85   Pulse: 77   SpO2: 97%       Body mass index is 26.3 kg/m.    General:  Patient appears their stated age, well-nourished.  Alert and in no apparent distress.  Lungs: Respiratory effort unlabored, chest expansion symmetric.  Cardiac: RRR,  no JVD.   Extremities warm, Right Femoral pulses 2+, Left Femoral 2+, Right popliteal 2+, Left popliteal 2+, Right DP 2+, Left DP 2+, Right PT 2+, Left PT 2+,   Abd: Soft, nondistended, nontender.   VWU:JWJX ROM in all 4 extremities, left calf pain with swelling. large varicosities noted at the left calf.  Skin: Color appropriate for race, Skin warm, dry, no gangrene, no non healing ulcers, , no hyperpigmentation, no lipo-dermatosclerosis  Neuro: Good insight and judgment, oriented to person, place, and time CN II-XII intact, gross motor and sensory intact     Labs     CBC:   WBC   Date/Time Value Ref Range Status   07/03/2019 09:23 AM 7.79 3.10 - 9.50 x10 3/uL Final   11/25/2008 12:20 PM 8.64  3.50 - 10.80 /CUMM Final     RBC   Date/Time Value Ref Range Status   07/03/2019 09:23 AM 4.35 4.20 - 5.90 x10 6/uL Final     Hgb   Date/Time Value Ref Range Status   07/03/2019 09:23 AM 13.1 12.5 - 17.1 g/dL Final     Hematocrit   Date/Time  Value Ref Range Status   07/03/2019 09:23 AM 41.9 37.6 - 49.6 % Final     MCV   Date/Time Value Ref Range Status   07/03/2019 09:23 AM 96.3 (H) 78.0 - 96.0 fL Final     MCHC   Date/Time Value Ref Range Status   07/03/2019 09:23 AM 31.3 (L) 31.5 - 35.8 g/dL Final     RDW   Date/Time Value Ref Range Status   07/03/2019 09:23 AM 14 11 - 15 % Final     Platelets   Date/Time Value Ref Range Status   07/03/2019 09:23 AM 220 142 - 346 x10 3/uL Final       CMP:   Sodium   Date/Time Value Ref Range Status   07/03/2019 09:23 AM 137 136 - 145 mEq/L Final     Potassium   Date/Time Value Ref Range Status   07/03/2019 09:23 AM 4.5 3.5 - 5.1 mEq/L Final     Chloride   Date/Time Value Ref Range Status   07/03/2019 09:23 AM 101 100 - 111 mEq/L Final     CO2   Date/Time Value Ref Range Status   07/03/2019 09:23 AM 30 (H) 21 - 29 mEq/L Final     Glucose   Date/Time Value Ref Range Status   07/03/2019 09:23 AM 95 70 - 100 mg/dL Final     Comment:     ADA guidelines for diabetes mellitus:  Fasting:  Equal to or greater than 126 mg/dL  Random:   Equal to or greater than 200 mg/dL       BUN   Date/Time Value Ref Range Status   07/03/2019 09:23 AM 7.0 (L) 9.0 - 28.0 mg/dL Final     Protein, Total   Date/Time Value Ref Range Status   07/03/2019 09:23 AM 7.3 6.0 - 8.3 g/dL Final     Alkaline Phosphatase   Date/Time Value Ref Range Status   07/03/2019 09:23 AM 79 38 - 106 U/L Final     AST (SGOT)   Date/Time Value Ref Range Status   07/03/2019 09:23 AM 16 5 - 34 U/L Final     ALT   Date/Time Value Ref Range Status   07/03/2019 09:23 AM 11 0 - 55 U/L Final     Anion Gap   Date/Time Value Ref Range Status   07/03/2019 09:23 AM 6.0 5.0 - 15.0 Final     Comment:     Calculated AGAP = Na - (CL + CO2)  Interpret with caution; calculated AGAP may not reflect patient's  true clinical status.         Lipid Panel   Cholesterol   Date/Time Value Ref Range Status   07/03/2019 09:23 AM 183 0 - 199 mg/dL Final     Triglycerides   Date/Time Value Ref Range  Status   07/03/2019 09:23 AM 75 34 - 149 mg/dL Final     HDL   Date/Time Value Ref Range Status   07/03/2019 09:23 AM 57 40 - 9,999 mg/dL Final     Comment:     An HDL cholesterol <40 mg/dL is low and constitutes a  coronary heart disease risk factor, and HDL-C>59 mg/dL is  a negative risk factor for CHD.  Ref: American Heart Association; Circulation 2004         Coags:   PT   Date/Time Value Ref Range Status   04/23/2016 08:03 PM 14.0 12.6 - 15.0 sec Final     PT INR   Date/Time Value Ref Range Status   04/23/2016 08:03 PM 1.1 0.9 - 1.1 Final     Comment:     Recommended Ranges for Protime INR:    2.0-3.0 for most medical and surgical thromboembolic states    2.5-3.5 for artificial heart valves  INR result may not represent exact Warfarin dosing level during  the transition period from Heparin to Warfarin therapy.  Recommend close clinical monitoring.       PTT   Date/Time Value Ref Range Status   04/23/2016 08:03 PM 30 23 - 37 sec Final     Comment:     In vivo therapeutic range of heparin (0.3 - 0.7 IU/mL)  correlate with the following APTT times: 64 - 102 seconds.           Diagnostic Imaging     Venous duplex performed in our vascular lab today reveals no evidence of necrosis in the lower extremity.  No evidence of deep vein thrombosis in the contralateral right common femoral vein.  All the vessels duplex appear patent and within normal limits.    Assessment and Plan     1. Venous insufficiency     2. Pain in left lower leg       [name] [name] is a 54 y.o. male who presents with complaints of pain, swelling and inflamation of the left leg after undergoing left great saphenous and then left short  saphenous lvein ablation  On physical examination, the patient does still have large varicosities noted at the left calf which is tender to touch.    Venous ultrasound today reveals no evidence of deep vein thrombosis in the left lower.   He continues to utilize compression stockings as recommended but not has not  taken any NSAIDs for pain and inflammation. We have advised patient to take NSAID as needed, walk, elevate left lower extremity when resting or sleeping and continue to utilize compression stockings.    He is made aware that the discomfort can take up to 4 months to completely resolve. Additionally, we advised that after 3 months, if the symptoms persists and or if the prominent calf varicose veins are still present and causing discomfort, Dr Lucianne Muss will recommend undergoing  microphlebectomy to remove the diseased veins.     I hope this plan of care meets with your approval. Thank you for the opportunity to care for your patient.     Sincerely,     Romelle Starcher, MD     Blima Ledger, FNP-C  [facility]  [address]  [place], [place] [address]  T [phone] ext [phone]  Carmon Ginsberg [phone]    This note was generated by the Epic EMR system/ Dragon speech recognition and may contain inherent errors or omissions not intended by the user. Grammatical errors, random word insertions, deletions, pronoun errors and incomplete sentences are occasional consequences of this technology due to software limitations. Not all errors are caught or corrected. If there are questions or concerns about the content of this note or information contained within the body of this dictation they should be addressed directly with the  author for clarification

## 2020-02-18 ENCOUNTER — Telehealth (INDEPENDENT_AMBULATORY_CARE_PROVIDER_SITE_OTHER): Payer: Self-pay

## 2020-02-18 DIAGNOSIS — I872 Venous insufficiency (chronic) (peripheral): Secondary | ICD-10-CM

## 2020-02-18 NOTE — Telephone Encounter (Signed)
-   Pt called concerning symptoms following recent RFAs.   - Pt underwent L GSV RFA on 09/30 and L SSV RFA on 10/14 with Dr. Lucianne Muss.   - Post-op duplexes revealed no evidence of DVT.    - Last seen 10/26 with NP Kara Mead and was recommended to continue use of compression, elevating leg at night, walk, and take NSAIDs PRN for pain and inflammation.   - Pt reports worsening swelling of LLE localized from L knee down to L ankle. States he has not been wearing compression as recommended.  - Complains of pain in BLE in "different areas". States pain is just above both ankles, around both sides of his knees, and the middle of both thighs. States he was taking ibuprofen as recommended, but it provided no relief.   - Pt also reports a spasm in both thighs.   - Pt states he elevated his LLE at night for "one month" following last OV with no improvement.    - Denies redness/warmth  - Pt specifically requested an appointment to be seen in office, stating "for my own comfort, I would like to be seen".   - Offered [place] on 12/29 and f/u with Dr. Lucianne Muss on 01/04.   - BLE venous insufficiency duplex ordered.   - In the meantime, reviewed previous recommendations with pt to continue compression, walking, elevating LLE, and taking NSAIDs for pain. He verbalized understanding.

## 2020-02-23 ENCOUNTER — Encounter (INDEPENDENT_AMBULATORY_CARE_PROVIDER_SITE_OTHER): Payer: Self-pay

## 2020-02-23 NOTE — Progress Notes (Signed)
Room:         Appt Date/Time: 03/01/2020 @ 2:50pm     54 y.o. male   PCP: Pcp, None, MD  Referring Physician:    New Patient Follow-Up X  Post-op   [place] [facility] [place] [facility] X 12/29 Venous  Other Imaging     Chief Complaint/HPI: C/O BLE pain/LLE swelling. H/O L GSV RFA on 09/30 and L SSV RFA on 10/14.   Date of Last Visit: 12/22/2019 w/ Kara Mead     Allergies:   Allergies   Allergen Reactions    Gluten      Pt states "white bread"    Percocet [Oxycodone-Acetaminophen]     Tylenol [Acetaminophen] Hives     Tyelnol#3, hives and throw up        Selected Medications:    Coumadin Xarelto Eliquis Pradaxa Lovenox Other thinner:    Plavix Aspirin Brillinta Effient  Pletal Trental Fish Oil   Insulin Metformin Other Diabetes Atorvastatin Rosuvastatin Other Chol:    MHx:  Stroke / TIA / Seizures HTN / HLD  Diabetes X Prediabetes    MI / CAD A-fib / Arrhythmia X Heart Murmur  COPD / Asthma / (other lung)   Kidney Disease Liver Disease DVT / Coagulopathy   Wounds Spine / other MSK  Cancer        X    Smoker           Former Smoker          Never Smoker    Vital Signs this Visit Pulses  HT  BP R BP L Temp   Rad Ulnar Brach Fem Pop DP PT        R          WT  Pulse R Pulse L SpO2       L          Imaging results:        Plan of Care:

## 2020-02-24 ENCOUNTER — Ambulatory Visit (INDEPENDENT_AMBULATORY_CARE_PROVIDER_SITE_OTHER): Payer: 59

## 2020-02-24 DIAGNOSIS — I872 Venous insufficiency (chronic) (peripheral): Secondary | ICD-10-CM

## 2020-03-01 ENCOUNTER — Ambulatory Visit (INDEPENDENT_AMBULATORY_CARE_PROVIDER_SITE_OTHER): Payer: Medicaid Other | Admitting: Specialist

## 2020-03-07 ENCOUNTER — Encounter (INDEPENDENT_AMBULATORY_CARE_PROVIDER_SITE_OTHER): Payer: Self-pay

## 2020-03-07 NOTE — Progress Notes (Signed)
Room:         Appt Date/Time: 03/08/2020 @ 2:50PM    55 y.o. male   PCP: Pcp, None, MD  Referring Physician:    New Patient Follow-Up Post-op   [place] [facility] [place] [facility] Other Imaging     Chief Complaint/HPI: 3 month F/u LLE swelling and BLE pain  At last visit pt was advised symptoms could take up to 4 months to completely resolve. If symptoms persoists advised pt can recommended microphlebectomy to remove the diseased veins.   Hx of using compression stockings but not taking any NSAIDS for pain and inflammation.  Date of Last Visit: 12/22/2019      Allergies:   Allergies   Allergen Reactions    Gluten      Pt states "white bread"    Percocet [Oxycodone-Acetaminophen]     Tylenol [Acetaminophen] Hives     Tyelnol#3, hives and throw up       Selected Medications:    Coumadin Xarelto Eliquis Pradaxa Lovenox Other thinner:    Plavix Aspirin Brillinta Effient  Pletal Trental Fish Oil   Insulin Metformin Other Diabetes Atorvastatin Rosuvastatin Other Chol:    MHx:  Stroke / TIA / Seizures HTN / HLD  Diabetes   MI / CAD A-fib / Arrhythmia  COPD / Asthma / (other lung)   Kidney Disease Liver Disease DVT / Coagulopathy   Wounds Spine / other MSK  Cancer        X    Smoker           Former Smoker          Never Smoker    Vital Signs this Visit Pulses  HT  BP R BP L Temp   Rad Ulnar Brach Fem Pop DP PT        R          WT  Pulse R Pulse L SpO2       L          Imaging results:        Plan of Care:

## 2020-03-08 ENCOUNTER — Encounter (INDEPENDENT_AMBULATORY_CARE_PROVIDER_SITE_OTHER): Payer: Self-pay | Admitting: Specialist

## 2020-03-08 ENCOUNTER — Ambulatory Visit (INDEPENDENT_AMBULATORY_CARE_PROVIDER_SITE_OTHER): Payer: 59 | Admitting: Specialist

## 2020-03-08 VITALS — BP 113/74 | HR 91 | Temp 97.3°F | Ht 68.5 in | Wt 173.0 lb

## 2020-03-08 DIAGNOSIS — I872 Venous insufficiency (chronic) (peripheral): Secondary | ICD-10-CM

## 2020-03-08 NOTE — Progress Notes (Signed)
[facility]    Chief Complaint   Patient presents with    Follow-up     Left lower extremity pain.       History of Present Illness     [name] [name] is a 55 y.o. male who presents to the office for follow up evaluation.  The called the office  complaining  swelling and discomfort involving the left lower extremity.   The patient is S/P endovascular radiofrequency ablation of left short saphenous vein on 12/10/19 after undergoing  left great saphenous vein 11/26/19. Both procedures were performed by Dr. Lucianne Muss for progressively worsening of varicose veins and associated pain and discomfort. Ultrasound after those 2 procedures demonstrated that those vessels were successfully ablated and no evidence of  DVT found.          Patient has been compliant with compression stockings and extremity elevation.   Today, he continues to complain of bulging varicose vein at the left calf,  pulling sensation and  numbness of the left lower leg. He denies redness and warmth. He Also denies claudication and rest pain.   Venous duplex performed today in our vascular lab  02/24/20 was reviewed with patient.    Past Medical History     Past Medical History:   Diagnosis Date    Echocardiogram 07/2015, 08/2011    Gallstones     Gastritis     Heart murmur     Heroin abuse     Syphilis        Past Surgical History     History reviewed. No pertinent surgical history.    Family History     Family History   Problem Relation Age of Onset    Diabetes Mother     Hypertension Mother     Other Father         Unknown disease       Social History     Social History     Socioeconomic History    Marital status: Single   Tobacco Use    Smoking status: Current Some Day Smoker     Packs/day: 0.25     Years: 10.00     Pack years: 2.50     Types: Cigarettes    Smokeless tobacco: Never Used   Haematologist Use: Never used   Substance and Sexual Activity    Alcohol use: Not Currently     Comment: rarely, drinks regularly     Drug use: Not Currently     Types: Marijuana, Cocaine     Comment: heroin        Allergies     Allergies   Allergen Reactions    Gluten      Pt states "white bread"    Percocet [Oxycodone-Acetaminophen]     Tylenol [Acetaminophen] Hives     Tyelnol#3, hives and throw up       Medications     No current outpatient medications on file prior to visit.     No current facility-administered medications on file prior to visit.       Review of Systems     Constitutional: Negative for fevers and chills  Skin: large painful varicose vein.  Respiratory: Negative for cough, wheezing, or hemoptysis  Cardiovascular: as per HPI  Gastrointestinal: Negative for abdominal pain, nausea, vomiting and diarrhea  Musculoskeletal:  No arthritic symptoms  Genitourinary: Negative for dysuria  All other systems were reviewed and are negative except what is stated  in the HPI      Physical Exam     Vitals:    03/08/20 1535   BP: 113/74   Pulse: 91   Temp:    SpO2:        Body mass index is 25.92 kg/m.    General:  Patient appears their stated age, well-nourished.  Alert and in no apparent distress.  Lungs: Respiratory effort unlabored, chest expansion symmetric.  Cardiac: RRR,  no JVD.   Extremities warm, Right Femoral pulses 2+, Left Femoral 2+, Right popliteal 2+, Left popliteal 2+, Right DP 2+, Left DP 2+, Right PT 2+, Left PT 2+,   Abd: Soft, nondistended, nontender.   ZOX:WRUE ROM in all 4 extremities, left calf. large varicosities noted at the left calf.  Skin: Color appropriate for race, Skin warm, dry, no gangrene, no non healing ulcers, , no hyperpigmentation, no lipo-dermatosclerosis  Neuro: Good insight and judgment, oriented to person, place, and time CN II-XII intact, gross motor and sensory intact     Labs     CBC:   WBC   Date/Time Value Ref Range Status   07/03/2019 09:23 AM 7.79 3.10 - 9.50 x10 3/uL Final   11/25/2008 12:20 PM 8.64 3.50 - 10.80 /CUMM Final     RBC   Date/Time Value Ref Range Status   07/03/2019 09:23 AM  4.35 4.20 - 5.90 x10 6/uL Final     Hgb   Date/Time Value Ref Range Status   07/03/2019 09:23 AM 13.1 12.5 - 17.1 g/dL Final     Hematocrit   Date/Time Value Ref Range Status   07/03/2019 09:23 AM 41.9 37.6 - 49.6 % Final     MCV   Date/Time Value Ref Range Status   07/03/2019 09:23 AM 96.3 (H) 78.0 - 96.0 fL Final     MCHC   Date/Time Value Ref Range Status   07/03/2019 09:23 AM 31.3 (L) 31.5 - 35.8 g/dL Final     RDW   Date/Time Value Ref Range Status   07/03/2019 09:23 AM 14 11 - 15 % Final     Platelets   Date/Time Value Ref Range Status   07/03/2019 09:23 AM 220 142 - 346 x10 3/uL Final       CMP:   Sodium   Date/Time Value Ref Range Status   07/03/2019 09:23 AM 137 136 - 145 mEq/L Final     Potassium   Date/Time Value Ref Range Status   07/03/2019 09:23 AM 4.5 3.5 - 5.1 mEq/L Final     Chloride   Date/Time Value Ref Range Status   07/03/2019 09:23 AM 101 100 - 111 mEq/L Final     CO2   Date/Time Value Ref Range Status   07/03/2019 09:23 AM 30 (H) 21 - 29 mEq/L Final     Glucose   Date/Time Value Ref Range Status   07/03/2019 09:23 AM 95 70 - 100 mg/dL Final     Comment:     ADA guidelines for diabetes mellitus:  Fasting:  Equal to or greater than 126 mg/dL  Random:   Equal to or greater than 200 mg/dL       BUN   Date/Time Value Ref Range Status   07/03/2019 09:23 AM 7.0 (L) 9.0 - 28.0 mg/dL Final     Protein, Total   Date/Time Value Ref Range Status   07/03/2019 09:23 AM 7.3 6.0 - 8.3 g/dL Final     Alkaline Phosphatase   Date/Time Value Ref Range Status  07/03/2019 09:23 AM 79 38 - 106 U/L Final     AST (SGOT)   Date/Time Value Ref Range Status   07/03/2019 09:23 AM 16 5 - 34 U/L Final     ALT   Date/Time Value Ref Range Status   07/03/2019 09:23 AM 11 0 - 55 U/L Final     Anion Gap   Date/Time Value Ref Range Status   07/03/2019 09:23 AM 6.0 5.0 - 15.0 Final     Comment:     Calculated AGAP = Na - (CL + CO2)  Interpret with caution; calculated AGAP may not reflect patient's  true clinical status.          Lipid Panel   Cholesterol   Date/Time Value Ref Range Status   07/03/2019 09:23 AM 183 0 - 199 mg/dL Final     Triglycerides   Date/Time Value Ref Range Status   07/03/2019 09:23 AM 75 34 - 149 mg/dL Final     HDL   Date/Time Value Ref Range Status   07/03/2019 09:23 AM 57 40 - 9,999 mg/dL Final     Comment:     An HDL cholesterol <40 mg/dL is low and constitutes a  coronary heart disease risk factor, and HDL-C>59 mg/dL is  a negative risk factor for CHD.  Ref: American Heart Association; Circulation 2004         Coags:   PT   Date/Time Value Ref Range Status   04/23/2016 08:03 PM 14.0 12.6 - 15.0 sec Final     PT INR   Date/Time Value Ref Range Status   04/23/2016 08:03 PM 1.1 0.9 - 1.1 Final     Comment:     Recommended Ranges for Protime INR:    2.0-3.0 for most medical and surgical thromboembolic states    2.5-3.5 for artificial heart valves  INR result may not represent exact Warfarin dosing level during  the transition period from Heparin to Warfarin therapy.  Recommend close clinical monitoring.       PTT   Date/Time Value Ref Range Status   04/23/2016 08:03 PM 30 23 - 37 sec Final     Comment:     In vivo therapeutic range of heparin (0.3 - 0.7 IU/mL)  correlate with the following APTT times: 64 - 102 seconds.           Diagnostic Imaging     Venous duplex performed in our vascular lab 02/24/20   today reveals     Conclusions: No evidence of deep vein thrombosis  in the bilateral lower extremity veins.   Competent right great and small saphenous veins.  Left great saphenous vein appears remain closed post ablation.  Greater than 0.5 seconds of reflux seen in the left short saphenous vein from the sapheno-popliteal junction  to the mid calf.   The  left mid small saphenous vein remains closed post ablation.  Multiple varicose veins noted in the left mid medial and posterior calf.    Assessment and Plan     No diagnosis found.  [name] [name] is a 55 y.o. male with history of venous insufficency. The  patient has large varicosities at the left calf. He's S/P left great and small saphenous veins RFA in October. He returns today with complains of new bulging veins with pain, numbness and discomfort in the Left medial calf region..  Venous duplex performed a week ago in our vascular lab showed that the left great saphenous and the left mid  small saphenous veins remain closed post ablation. However, reflux was seen in the left short saphenous vein from the sapheno-popliteal junction to the mid calf and multiple varicose veins are noted in the left  medial and posterior calf. Those varicose veins are measuring 12.56mm in the posteriorcalf and 8.54mm in the medial calf.   It appears that those varicose veins were too large to be treated alone with radiofrequency ablation. The patient will benefit more if  undergoes micro phlebectomy to remove those large varicose veins.     We reviewed the indications, benefits, and risks of microphlebectomy patient had questions which Dr. Lucianne Muss anwsered to patient's satisfaction.    Risks of the procedure including but not limited to bleeding, infection,  Nerve damage, deep thrombosis. The patient demonstrated understanding. He  wants to think about it and research the procedure more. Informational pamphlet provided to him as well. The patient will call the office if he decides to undergo the reccomended procedure.     In the interim, he should continues to utilize compression stockings and  elevate left lower extremity when resting or sleeping and continue to utilize compression stockings.     I hope this plan of care meets with your approval. Thank you for the opportunity to care for your patient.     Sincerely,     Romelle Starcher, MD     Blima Ledger, FNP-C  [facility]  [address]  [place], [place] [address]  T [phone] ext [phone]  Carmon Ginsberg [phone]    This note was generated by the Epic EMR system/ Dragon speech recognition and may contain  inherent errors or omissions not intended by the user. Grammatical errors, random word insertions, deletions, pronoun errors and incomplete sentences are occasional consequences of this technology due to software limitations. Not all errors are caught or corrected. If there are questions or concerns about the content of this note or information contained within the body of this dictation they should be addressed directly with the author for clarification

## 2020-04-14 ENCOUNTER — Emergency Department: Payer: 59

## 2020-04-14 ENCOUNTER — Emergency Department
Admission: EM | Admit: 2020-04-14 | Discharge: 2020-04-14 | Disposition: A | Discharge status: Home / Self Care | Payer: 59 | Attending: Emergency Medical Services | Admitting: Emergency Medical Services

## 2020-04-14 DIAGNOSIS — F17210 Nicotine dependence, cigarettes, uncomplicated: Secondary | ICD-10-CM | POA: Insufficient documentation

## 2020-04-14 DIAGNOSIS — M79605 Pain in left leg: Secondary | ICD-10-CM | POA: Insufficient documentation

## 2020-04-14 DIAGNOSIS — R079 Chest pain, unspecified: Secondary | ICD-10-CM | POA: Insufficient documentation

## 2020-04-14 DIAGNOSIS — H6690 Otitis media, unspecified, unspecified ear: Secondary | ICD-10-CM | POA: Insufficient documentation

## 2020-04-14 LAB — CBC AND DIFFERENTIAL
Absolute NRBC: 0 10*3/uL (ref 0.00–0.00)
Basophils Absolute Automated: 0.05 10*3/uL (ref 0.00–0.08)
Basophils Automated: 0.8 %
Eosinophils Absolute Automated: 0.27 10*3/uL (ref 0.00–0.44)
Eosinophils Automated: 4.1 %
Hematocrit: 38.6 % (ref 37.6–49.6)
Hgb: 12.9 g/dL (ref 12.5–17.1)
Immature Granulocytes Absolute: 0.01 10*3/uL (ref 0.00–0.07)
Immature Granulocytes: 0.2 %
Lymphocytes Absolute Automated: 3.01 10*3/uL (ref 0.42–3.22)
Lymphocytes Automated: 45.5 %
MCH: 31.2 pg (ref 25.1–33.5)
MCHC: 33.4 g/dL (ref 31.5–35.8)
MCV: 93.2 fL (ref 78.0–96.0)
MPV: 10.3 fL (ref 8.9–12.5)
Monocytes Absolute Automated: 0.7 10*3/uL (ref 0.21–0.85)
Monocytes: 10.6 %
Neutrophils Absolute: 2.57 10*3/uL (ref 1.10–6.33)
Neutrophils: 38.8 %
Nucleated RBC: 0 /100 WBC (ref 0.0–0.0)
Platelets: 176 10*3/uL (ref 142–346)
RBC: 4.14 10*6/uL — ABNORMAL LOW (ref 4.20–5.90)
RDW: 13 % (ref 11–15)
WBC: 6.61 10*3/uL (ref 3.10–9.50)

## 2020-04-14 LAB — GFR: EGFR: >60

## 2020-04-14 LAB — COMPREHENSIVE METABOLIC PANEL
ALT: 17 U/L (ref 0–55)
AST (SGOT): 20 U/L (ref 5–34)
Albumin/Globulin Ratio: 1.3 (ref 0.9–2.2)
Albumin: 4 g/dL (ref 3.5–5.0)
Alkaline Phosphatase: 68 U/L (ref 38–106)
Anion Gap: 9 (ref 5.0–15.0)
BUN: 10 mg/dL (ref 9.0–28.0)
Bilirubin, Total: 0.4 mg/dL (ref 0.2–1.2)
CO2: 26 mEq/L (ref 22–29)
Calcium: 9.2 mg/dL (ref 8.5–10.5)
Chloride: 104 mEq/L (ref 100–111)
Creatinine: 0.8 mg/dL (ref 0.7–1.3)
Globulin: 3.1 g/dL (ref 2.0–3.6)
Glucose: 86 mg/dL (ref 70–100)
Potassium: 4.8 mEq/L (ref 3.5–5.1)
Protein, Total: 7.1 g/dL (ref 6.0–8.3)
Sodium: 139 mEq/L (ref 136–145)

## 2020-04-14 LAB — IHS D-DIMER: D-Dimer: 0.29 ug/mL FEU (ref 0.00–0.60)

## 2020-04-14 LAB — TROPONIN I: Troponin I: <0.01 ng/mL (ref 0.00–0.05)

## 2020-04-14 MED ORDER — AZITHROMYCIN 250 MG PO TABS
ORAL_TABLET | ORAL | 0 refills | Status: DC
Start: 2020-04-14 — End: 2020-04-14

## 2020-04-14 MED ORDER — AZITHROMYCIN 250 MG PO TABS
ORAL_TABLET | ORAL | 0 refills | Status: AC
Start: 2020-04-14 — End: 2020-04-19

## 2020-04-14 NOTE — ED Provider Notes (Signed)
EMERGENCY DEPARTMENT HISTORY AND PHYSICAL EXAM     None        ED Course       Patient feels better. I have discussed all testing results and plan of care with patient. Patient agrees with going home and following up and return if worsening symptoms. Side effects of medications such as dizziness associated with opioids and muscle relaxants, nausea/vomiting and constipation associated with opioids and potential complications of antibiotics i.e. c. diff, yeast infection, rash, allergic reaction discussed.         Provider Note:       R/o nstemi, r/o pe, r/o dvt, r/o pna, r/o electrolyte     Personal Protective Equipment (PPE)    Face Shield, Gloves, N95 and Surgical / Bouffant Cap    HISTORY OF PRESENT ILLNESS   Historian:Patient  Translator Used: No    55 y.o. male here for intermittent chest pain, shoulder discomfort, left leg discomfort and b/l ear discomfort for 5 days.   Mattheus Rauls also has ear discomfort.     1. Location of symptoms: chest  2. Onset of symptoms: recent   3. What was patient doing when symptoms started (Context): nothing  4. Severity: moderate  5. Timing: intermittent  6. Activities that worsen symptoms: none  7. Activities that improve symptoms: none  8. Quality:   9. Radiation of symptoms:  10. Associated signs and Symptoms: see above  11. Are symptoms worsening? Yes    MEDICAL HISTORY     Past Medical History:  Past Medical History:   Diagnosis Date    Echocardiogram 07/2015, 08/2011    Gallstones     Gastritis     Heart murmur     Heroin abuse     Syphilis        Past Surgical History:  History reviewed. No pertinent surgical history.    Social History:  Social History     Socioeconomic History    Marital status: Single   Tobacco Use    Smoking status: Current Some Day Smoker     Packs/day: 0.25     Years: 10.00     Pack years: 2.50     Types: Cigarettes    Smokeless tobacco: Never Used   Haematologist Use: Never used   Substance and Sexual Activity    Alcohol use: Not Currently      Comment: rarely, drinks regularly    Drug use: Not Currently     Types: Marijuana, Cocaine     Comment: heroin        Family History:  Family History   Problem Relation Age of Onset    Diabetes Mother     Hypertension Mother     Other Father         Unknown disease       Allergies:  Allergies   Allergen Reactions    Nsaids Rash    Gluten      Pt states "white bread"    Percocet [Oxycodone-Acetaminophen]     Tylenol [Acetaminophen] Hives     Tyelnol#3, hives and throw up       Outpatient Medication:  Discharge Medication List as of 04/14/2020  7:04 PM          REVIEW OF SYSTEMS   ADD ROS  Chest pain  Shoulder pain  Ear fullness  All other systems reviewed and are negative.    PHYSICAL EXAM     Vitals:    04/14/20 1632  BP: 111/77   Pulse: 71   Resp: 17   Temp: 98.8 F (37.1 C)   SpO2: 100%     Nursing note and vitals reviewed.  Constitutional:  Well developed, well nourished.   Head:  Atraumatic. Normocephalic.    Eyes:  PERRL. EOMI. Conjunctivae are not pale.  ENT:  Mucous membranes are moist and intact. Patent airway.  Neck:  Supple. Full ROM.    Cardiovascular:  Regular rate. Regular rhythm.   Respiratory:  No evidence of respiratory distress.   GI:  Soft and non-distended. There is no tenderness. No rebound, guarding, or rigidity.  Back:  Full ROM. Nontender.  MSK:  No edema. No cyanosis. No clubbing. Full range of motion in all extremities.  Skin:  Skin is warm and dry.  No diaphoresis. No rash.   Neurological:  Alert, awake, and appropriate. Normal speech. Motor normal.  Psychiatric:  Good eye contact. Normal interaction, affect, and behavior.    MEDICAL DECISION MAKING     Vital Signs: Reviewed the patients vital signs.   Nursing Notes: Reviewed and utilized available nursing notes.  Medical Records Reviewed: Reviewed available past medical records.      PROCEDURES        CARDIAC STUDIES        Monitor Strip  Interpreted by ED Physician  Rate: 67  Rhythm: SR   ST Changes: none     EKG  Interpretation:    15:14 NSR at 65 bpm, LAD, no LVH, qrs 84, qtc 420 (-) ST-T change similar to ekg on 06/15/2019 as read by me.     IMAGING STUDIES      Chest AP Portable   Final Result    No acute pulmonary or pleural disease.      Sandie Ano, MD    04/14/2020 4:33 PM      US Venous Dopp Left Low Extrem   Final Result    No evidence for deep venous thrombosis in the left lower   extremity.      Mitali Bapna, MD    04/14/2020 4:21 PM             PULSE OXIMETRY    Oxygen Saturation by Pulse Oximetry: 100%  Interventions: none  Interpretation: normal     EMERGENCY DEPT. MEDICATIONS      ED Medication Orders (From admission, onward)    None          LABORATORY RESULTS    Ordered and independently interpreted AVAILABLE laboratory tests. Please see results section in chart for full details.  Results for orders placed or performed during the hospital encounter of 04/14/20   CBC and differential   Result Value Ref Range    WBC 6.61 3.10 - 9.50 x10 3/uL    Hgb 12.9 12.5 - 17.1 g/dL    Hematocrit 16.1 09.6 - 49.6 %    Platelets 176 142 - 346 x10 3/uL    RBC 4.14 (L) 4.20 - 5.90 x10 6/uL    MCV 93.2 78.0 - 96.0 fL    MCH 31.2 25.1 - 33.5 pg    MCHC 33.4 31.5 - 35.8 g/dL    RDW 13 11 - 15 %    MPV 10.3 8.9 - 12.5 fL    Neutrophils 38.8 None %    Lymphocytes Automated 45.5 None %    Monocytes 10.6 None %    Eosinophils Automated 4.1 None %    Basophils Automated 0.8 None %    Immature  Granulocytes 0.2 None %    Nucleated RBC 0.0 0.0 - 0.0 /100 WBC    Neutrophils Absolute 2.57 1.10 - 6.33 x10 3/uL    Lymphocytes Absolute Automated 3.01 0.42 - 3.22 x10 3/uL    Monocytes Absolute Automated 0.70 0.21 - 0.85 x10 3/uL    Eosinophils Absolute Automated 0.27 0.00 - 0.44 x10 3/uL    Basophils Absolute Automated 0.05 0.00 - 0.08 x10 3/uL    Immature Granulocytes Absolute 0.01 0.00 - 0.07 x10 3/uL    Absolute NRBC 0.00 0.00 - 0.00 x10 3/uL   Comprehensive metabolic panel   Result Value Ref Range    Glucose 86 70 - 100 mg/dL    BUN 16.1  9.0 - 09.6 mg/dL    Creatinine 0.8 0.7 - 1.3 mg/dL    Sodium 045 409 - 811 mEq/L    Potassium 4.8 3.5 - 5.1 mEq/L    Chloride 104 100 - 111 mEq/L    CO2 26 22 - 29 mEq/L    Calcium 9.2 8.5 - 10.5 mg/dL    Protein, Total 7.1 6.0 - 8.3 g/dL    Albumin 4.0 3.5 - 5.0 g/dL    AST (SGOT) 20 5 - 34 U/L    ALT 17 0 - 55 U/L    Alkaline Phosphatase 68 38 - 106 U/L    Bilirubin, Total 0.4 0.2 - 1.2 mg/dL    Globulin 3.1 2.0 - 3.6 g/dL    Albumin/Globulin Ratio 1.3 0.9 - 2.2    Anion Gap 9.0 5.0 - 15.0   Troponin I   Result Value Ref Range    Troponin I <0.01 0.00 - 0.05 ng/mL   GFR   Result Value Ref Range    EGFR >60.0    D-Dimer   Result Value Ref Range    D-Dimer 0.29 0.00 - 0.60 ug/mL FEU       CLINICAL DECISION SUPPORT   Wells Score      Value   HR greater than 100 0            ATTESTATIONS      Physician Attestation: Cecilie Kicks Devany Aja, MD PhD , have been the primary provider for [name] during this Emergency Dept visit and have reviewed the chart documented for accuracy and agree with its content.       DIAGNOSIS      Diagnosis:  Final diagnoses:   Acute otitis media, unspecified otitis media type   Chest pain, unspecified type   Pain of left lower extremity       Disposition:  ED Disposition     ED Disposition Condition Date/Time Comment    Discharge  Thu Apr 14, 2020  7:04 PM [name] discharge to home/self care.    Condition at disposition: Stable          Prescriptions:  Discharge Medication List as of 04/14/2020  7:04 PM      START taking these medications    Details   azithromycin (ZITHROMAX) 250 MG tablet Multiple Dosages:Starting Thu 04/14/2020, Until Thu 04/14/2020 at 2359, THEN Starting Fri 04/15/2020, Until Mon 04/18/2020 at 2359Take 2 tablets (500 mg total) by mouth daily for 1 day, THEN 1 tablet (250 mg total) daily for 4 days., E-Rx                    Aribella Vavra, Zadie Rhine, MD PhD  04/16/20 [phone]

## 2020-04-14 NOTE — ED Triage Notes (Signed)
c/o left leg pain in thigh/calf and intermittent left-sided "sharp" chest pain x5 days.. pt also feels a "pulsating feeling" on right ankle.. no injuries.Marland Kitchen denies SOB

## 2020-04-14 NOTE — Discharge Instructions (Signed)
Chest Pain of Unclear Etiology    You have been seen for chest pain. The cause of your pain is not yet known.    Your doctor has learned about your medical history, examined you, and checked any tests that were done. Still, it is not clear why you are having pain. The doctor thinks there is only a small chance that your pain is caused by a health problem that could lead to serious harm or death. Later, your primary care doctor might do more tests or check you again.    Sometimes chest pain is caused by a health problem that can lead to death, like a:   Heart attack.   Injury to the large blood vessel in your body (aorta).   Blood clot in the lung.   Collapsed lung.     It is not likely that your pain is caused by a health problem that could lead to death if:    Your chest pain lasts only a few seconds at a time   You are not short of breath, nauseated (sick to your stomach), sweaty, or lightheaded   Your pain gets worse when you twist or bend   Your pain improves with exercise or hard work.    Chest pain is serious. It is very important that you follow up with your regular doctor.    Return here or go to the nearest Emergency Department immediately if:   Your pain makes you short of breath, nauseated (sick to your stomach), or sweaty.   Your pain gets worse when you walk, go up stairs, or exert yourself.   You feel weak, lightheaded, or faint.   It hurts to breathe.   Your leg swells.   Your pain or symptoms get worse    You have new symptoms or concerns.    If you can't follow up with your doctor, or if at any time you feel you need to be rechecked or seen again, come back here or go to the nearest emergency department.               Leg Pain    You have been diagnosed with leg pain.    Many things can cause leg pain. Most of the causes are not dangerous and will get better on their own. These can include muscle cramps, bruises, strains, pinched nerves, and minor skin infections.    You had  an exam by your doctor today. Marnita Poirier or she decided that the cause of your leg pain does not seem to be serious or dangerous.    During your visit, you had an ultrasound (duplex doppler) to make sure that you didn't have a blood clot in your leg.    If your pain continues, you might need another exam or more tests. This is to find out why you have leg pain. The cause of your symptoms doesn't seem dangerous now. You don't need to stay in the hospital.    Though we don't believe your condition is dangerous right now, it is important to be careful. Sometimes a problem that seems mild can become serious later. This is why it is very important that you return here or go to the nearest Emergency Department if you are not improving or your symptoms are getting worse.    Some things you may try at home are:    Over-the-counter pain medications like that have ibuprofen (Advil/Motrin) or acetaminophen (Tylenol) in them. Follow the directions on the package.     Rest the leg as needed. As soon as you are able, start moving your leg again to keep it from getting stiff.    Return here or go to the nearest Emergency Department or follow-up with your doctor if you are not getting better as expected.    Follow the instructions for any medication you are prescribed.     YOU SHOULD SEEK MEDICAL ATTENTION IMMEDIATELY, EITHER HERE OR AT THE NEAREST EMERGENCY DEPARTMENT, IF ANY OF THE FOLLOWING HAPPENS:   You have a fever (temperature higher than 100.4F or 38C).   Your pain does not go away or gets worse.   Your leg or joints (hip, knee, ankle, etc.) are red or swollen.   Your chest hurts or you get short of breath.   You have any other symptoms or concerns, or don't get better as expected.    If you can't follow up with your doctor, or if at any time you feel you need to be rechecked or seen again, come back here or go to the nearest emergency department.

## 2020-04-14 NOTE — ED Notes (Signed)
Bed: M08  Expected date:   Expected time:   Means of arrival:   Comments:

## 2020-04-14 NOTE — EDIE (Signed)
COLLECTIVE?NOTIFICATION?04/14/2020 15:09?KHYRIN, TREVATHAN R?MRN: [id number]    Criteria Met      5 + ED Visits in 12 Months    Security and Safety  No recent Security Events currently on file    ED Care Guidelines  There are currently no ED Care Guidelines for this patient. Please check your facility's medical records system.        Prescription Monitoring Program  000??- Narcotic Use Score  000??- Sedative Use Score  000??- Stimulant Use Score  000??- Overdose Risk Score  - All Scores range from 000-999 with 75% of the population scoring < 200 and on 1% scoring above 650  - The last digit of the narcotic, sedative, and stimulant score indicates the number of active prescriptions of that type  - Higher Use scores correlate with increased prescribers, pharmacies, mg equiv, and overlapping prescriptions  - Higher Overdose Risk Scores correlate with increased risk of unintentional overdose death   Concerning or unexpectedly high scores should prompt a review of the PMP record; this does not constitute checking PMP for prescribing purposes.      E.D. Visit Count (12 mo.)  Facility Visits   [place] [facility]: [facility] [place] 1   [facility] 2   [facility] 1   [place] [facility] 1   Total 5   Note: Visits indicate total known visits.     Recent Emergency Department Visit Summary  Date Facility [facility] Type Diagnoses or Chief Complaint   Apr 14, 2020 [place] [facility] [place] [facility]      Chest Pain and Leg Pain      Jun 15, 2019 [facility] [place] [facility] [place] [facility]      Leg Pain      Chest Pain      Pain in left leg      Chest pain, unspecified      May 30, 2019 [place] [facility] [place] [name] [place] [facility]      trouble breathing, chest pain      Chest Pain      Other chest pain      May 28, 2019 [place] [name] [place] [facility]      chest pain      Chest pain, unspecified      Contact with and (suspected)  exposure to covid-19      May 12, 2019  - [name] [place] [facility]      chest pain      Chest pain, unspecified          Recent Inpatient Visit Summary  No recorded inpatient visits.     Care Team  Provider Specialty Phone Fax Service Dates   [name] , MD Internal Medicine   Current      Collective Portal  This patient has registered at the [facility] Emergency Room: [facility] Emergency Department   For more information visit: [email or web address]     PLEASE NOTE:     1.   Any care recommendations and other clinical information are provided as guidelines or for historical purposes only, and providers should exercise their own clinical judgment when providing care.    2.   You may only use this information for purposes of treatment, payment or health care operations activities, and subject to the limitations of applicable [employer] Policies.    3.   You should consult directly with the organization that  provided a care guideline or other clinical history with any questions about additional information or accuracy or completeness of information provided.    ? 2022 [employer] - www.collectivemedical.com

## 2020-04-16 LAB — ECG 12-LEAD
Atrial Rate: 65 {beats}/min
P Axis: 60 degrees
P-R Interval: 156 ms
Q-T Interval: 404 ms
QRS Duration: 84 ms
QTC Calculation (Bezet): 420 ms
R Axis: -40 degrees
T Axis: 5 degrees
Ventricular Rate: 65 {beats}/min

## 2020-04-28 ENCOUNTER — Telehealth (INDEPENDENT_AMBULATORY_CARE_PROVIDER_SITE_OTHER): Payer: Self-pay | Admitting: Internal Medicine

## 2020-04-28 ENCOUNTER — Encounter (INDEPENDENT_AMBULATORY_CARE_PROVIDER_SITE_OTHER): Payer: Self-pay | Admitting: Internal Medicine

## 2020-04-28 ENCOUNTER — Ambulatory Visit (INDEPENDENT_AMBULATORY_CARE_PROVIDER_SITE_OTHER): Payer: 59 | Admitting: Internal Medicine

## 2020-04-28 ENCOUNTER — Telehealth (INDEPENDENT_AMBULATORY_CARE_PROVIDER_SITE_OTHER): Payer: Self-pay

## 2020-04-28 VITALS — BP 122/76 | HR 75 | Temp 98.0°F | Resp 18 | Ht 66.0 in | Wt 174.8 lb

## 2020-04-28 DIAGNOSIS — L819 Disorder of pigmentation, unspecified: Secondary | ICD-10-CM

## 2020-04-28 DIAGNOSIS — Z9889 Other specified postprocedural states: Secondary | ICD-10-CM

## 2020-04-28 DIAGNOSIS — H9202 Otalgia, left ear: Secondary | ICD-10-CM

## 2020-04-28 DIAGNOSIS — I83812 Varicose veins of left lower extremities with pain: Secondary | ICD-10-CM

## 2020-04-28 MED ORDER — AMOXICILLIN-POT CLAVULANATE 875-125 MG PO TABS
1.0000 | ORAL_TABLET | Freq: Two times a day (BID) | ORAL | 0 refills | Status: AC
Start: 2020-04-28 — End: 2020-05-08

## 2020-04-28 NOTE — Telephone Encounter (Signed)
Patient when checking out wrote a note for Dr. Konrad Dolores regarding Vegi Diet like beans, Vegi, Soup, fruits to be faxed to: [phone], he said he does not have MyChart access yet so he wanted this information faxed at above number.

## 2020-04-28 NOTE — Progress Notes (Signed)
Have you seen any specialists/other providers since your last visit with us?    No    Arm preference verified?   Yes    The patient is due for  Advance Directive, High Risk Pneumonia.

## 2020-04-28 NOTE — Telephone Encounter (Signed)
Letter done. Please fax.

## 2020-04-28 NOTE — Telephone Encounter (Signed)
Dr. Konrad Dolores,    I am not sure what patient is asking for.    Please advise

## 2020-04-28 NOTE — Telephone Encounter (Signed)
Pt needs a copy of the prescribtion with the PC signature for   Amoxicillin-Pot Clavulanate 875-125 MG also needs to submit a dietary plan.    Fax [phone] Stefanie Libel nurse       Pt can be reached at :  [phone]

## 2020-04-28 NOTE — Telephone Encounter (Signed)
spoke w/pt, per nurse request, he was not able to  understand her questions would like to speak w/ dr about his leg suregery and and needs some blood tests/ he was advise to come in 15 min early to fill out paper work, bring his ins card and photo id. he wasnto establish care/ needs a PCP provider./adel   NP., Looking to est care  No To All Covid Screening Questions

## 2020-04-28 NOTE — Progress Notes (Signed)
Chief Complaint   Patient presents with    Ear Problem     Needs medication    Leg Pain     Surgery left 11/2019     [name] [name] is a 55 y.o. male who Reports left ear pain went to UC advised Zpak which is did not help  Him, he still feels discomfort Left ear   I advised amoxacillin   He saw Dr Lucianne Muss vascular for left LE varicose vein and  S/P endovascular radiofrequency ablation of left short saphenous vein on 12/10/19 after undergoing  left great saphenous vein 11/26/19. Both procedures were performed by Dr. Lucianne Muss for progressively worsening of varicose veins and associated pain and discomfort. Ultrasound after those 2 procedures demonstrated that those vessels were successfully ablated and no evidence of  DVT found.        He saw him again 03/08/2018 as he has been   complaining  swelling and discomfort involving the left lower extremity post surgery   He admitted to  compliant with compression stockings and extremity elevation.   But continued to have  bulging varicose vein at the left calf,  pulling sensation and  numbness of the left lower leg. H  Venous duplex performed today in our vascular lab  02/24/20 was reviewed with  With him By Dr Lucianne Muss  He reported same bulging vein and at times sharp unbearable pain and sometimes numbness on his lef   He wants my opinion  I advised I have not much experience with this and its better to go back to Dr Lucianne Muss    He has a black spot rt upper back I advised him to see dermatology [place] he still has not seen them  He has 2 hypopigmented spots ? Age spots on his upper extremity  Advised to show it to dermatology  [facility] and he wants me to  Formate a letter saying he is allowed to eat beans vegetables fruits and he does not eat redmeat      Past Medical History:   Diagnosis Date    Echocardiogram 07/2015, 08/2011    Gallstones     Gastritis     Heart murmur     Heroin abuse     Syphilis       History reviewed. No pertinent surgical history.    (Not in a hospital admission)    Current/Home Medications    No medications on file     Allergies   Allergen Reactions    Nsaids Rash    Gluten      Pt states "white bread"    Percocet [Oxycodone-Acetaminophen]     Tylenol [Acetaminophen] Hives     Tyelnol#3, hives and throw up      Social History     Tobacco Use    Smoking status: Light Tobacco Smoker     Packs/day: 0.25     Years: 10.00     Pack years: 2.50     Types: Cigarettes    Smokeless tobacco: Never Used   Substance Use Topics    Alcohol use: Not Currently     Comment: rarely, drinks regularly      Family History   Problem Relation Age of Onset    Diabetes Mother     Hypertension Mother     Other Father         Unknown disease        Review of Systems   Constitutional: Negative for  chills and fever.   HENT: Positive for congestion and ear pain. Negative for ear discharge, sinus pain and sore throat.    Respiratory: Negative for cough.    Cardiovascular: Negative for chest pain and palpitations.   Musculoskeletal:        Left leg pain as hPI    Skin:        Chronic rt upper back hyperpigmented spot   Hypopigmented spots upper extremity   Neurological: Positive for sensory change. Negative for dizziness, tremors, focal weakness and seizures.     Vitals:    04/28/20 1443   BP: 122/76   Pulse: 75   Resp: 18   Temp: 98 F (36.7 C)   SpO2: 98%     Physical Examination: General appearance - alert, well appearing, and in no distress  Mental status - alert, oriented to person, place, and time  Ears - right ear normal, left TM red, dull, bulging  Nose - normal and patent, no erythema, discharge or polyps and normal nontender sinuses  Mouth - mucous membranes moist, pharynx normal without lesions  Neck - supple, no significant adenopathy  Chest - clear to auscultation, no wheezes, rales or rhonchi, symmetric air entry  Heart - normal rate, regular rhythm, normal S1, S2, no murmurs, rubs, clicks or gallops  Neurological - alert, oriented, normal speech, no  focal findings or movement disorder noted  Extremities - peripheral pulses normal, no pedal edema, ,  Left leg bulging vein noted left calf , no redness erythema swelling Skin - as HPI   He has a black spot rt upper back I advised him to see dermatology [place] he still has not seen them  He has 2 hypopigmented spots ? Age spots on his upper extremity      Assessment/Plan:      1. Left ear pain    - amoxicillin-clavulanate (AUGMENTIN) 875-125 MG per tablet; Take 1 tablet by mouth 2 (two) times daily for 10 days  Dispense: 20 tablet; Refill: 0    2. Atypical pigmented skin lesion  See dermatology as advised in august    3. Varicose veins of leg with pain, left    4. History of prior ablation treatment  Per vascular follow up       Body mass index is 28.21 kg/m.    Risk & Benefits of the new medication(s) were explained to the patient who verbalized understanding & agreed to the treatment plan        Dr [name] MD  Internist  [facility] [place]  [address], [place],  [id number]  [phone]  [phone]

## 2020-04-29 ENCOUNTER — Encounter (INDEPENDENT_AMBULATORY_CARE_PROVIDER_SITE_OTHER): Payer: Self-pay | Admitting: Internal Medicine

## 2020-04-29 NOTE — Telephone Encounter (Signed)
done

## 2020-04-29 NOTE — Addendum Note (Signed)
Addended by: Terrall Laity on: 04/29/2020 08:06 AM     Modules accepted: Orders

## 2020-04-29 NOTE — Telephone Encounter (Signed)
Called and spoke with Enrique Sack the nurse who stated that they only need a copy with providers physical signature to allow pt to be medicated at the facility. Copy printed and placed on PCP desk for signature.

## 2020-04-29 NOTE — Telephone Encounter (Signed)
Called and spoke with [name] [name] who confirmed receiving diet letter and medication prescription signed copy.

## 2020-04-29 NOTE — Telephone Encounter (Signed)
Letter faxed as requested

## 2020-05-03 ENCOUNTER — Encounter (INDEPENDENT_AMBULATORY_CARE_PROVIDER_SITE_OTHER): Payer: Self-pay | Admitting: Cardiovascular Disease

## 2020-05-03 ENCOUNTER — Ambulatory Visit (INDEPENDENT_AMBULATORY_CARE_PROVIDER_SITE_OTHER): Payer: 59 | Admitting: Cardiovascular Disease

## 2020-05-03 DIAGNOSIS — I272 Pulmonary hypertension, unspecified: Secondary | ICD-10-CM

## 2020-05-03 DIAGNOSIS — R079 Chest pain, unspecified: Secondary | ICD-10-CM

## 2020-05-03 NOTE — Progress Notes (Signed)
[facility] OFF[facility] PROGRESS NOTE    [facility] OFFICE -CARDIOLOGY  [address]  [place] [address]  Dept: [phone]  Dept Fax: [phone]       Patient Name: [name] [name]    Date of Visit:  May 03, 2020  Date of Birth: 02-Sep-1965  AGE: 55 y.o.  Medical Record #: [id number]  Requesting Physician: [name], MD      CHIEF COMPLAINT: 6 Month Follow Up (DVT and History of Smoker) and Chest Pain      HISTORY OF PRESENT ILLNESS:    He is a pleasant 55 y.o. male who presents today for follow-up.  Patient with a history of chest pain syndrome normal exercise stress test October 08, 2019 at a high workload.  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 Lucianne Muss follow-up ultrasound without evidence of DVT tobacco use and a history of polysubstance abuse.  He is exercising regularly without any chest pain or shortness of breath he has ear/sinus infection is being treated by his primary care physician lipids were unremarkable    PAST MEDICAL HISTORY: He has a past medical history of Echocardiogram (07/2015, 08/2011), Gallstones, Gastritis, Heart murmur, Heroin abuse, and Syphilis. He has no past surgical history on file.    ALLERGIES:   Allergies   Allergen Reactions    Nsaids Rash    Gluten      Pt states "white bread"    Percocet [Oxycodone-Acetaminophen]     Tylenol [Acetaminophen] Hives     Tyelnol#3, hives and throw up       MEDICATIONS:   Current Outpatient Medications   Medication Sig    amoxicillin-clavulanate (AUGMENTIN) 875-125 MG per tablet Take 1 tablet by mouth 2 (two) times daily for 10 days        FAMILY HISTORY: family history includes Diabetes in his mother; Hypertension in his mother; Other in his father.    SOCIAL HISTORY: He reports that he has been smoking cigarettes. He has a 2.50 pack-year smoking history. He has never used smokeless tobacco. He reports  previous alcohol use. He reports previous drug use. Drugs: Marijuana and Cocaine.    PHYSICAL EXAMINATION    There were no vitals taken for this visit.    General Appearance:  A well-appearing male in no acute distress.    Skin: Warm and dry to touch, no apparent skin lesions, or masses noted.  Head: Normocephalic, normal hair pattern, no masses or tenderness   Eyes: EOMS Intact, PERRL, conjunctivae and lids unremarkable.  ENT: Ears, Nose and throat reveal no gross abnormalities.  No pallor or cyanosis.  Dentition good.   Neck: JVP normal, no carotid bruit, thyroid not enlarged   Chest: Clear to auscultation bilaterally with good air movement and respiratory effort and no wheezes, rales, or rhonchi   Cardiovascular: Regular rhythm, S1 normal, S2 normal, No S3 or S4. Apical impulse not displaced. No murmur. No gallops or rubs detected   Abdomen: Soft, nontender, nondistended, with normoactive bowel sounds. No organomegaly.  No pulsatile masses, or bruits.   Extremities: Warm without edema. No clubbing, or cyanosis. All peripheral pulses are full and equal.   Neuro: Alert and oriented x3. No gross motor or sensory deficits noted, affect appropriate.        ECG: Sinus rhythm no ST T wave changes normal EKG      LABS:   Lab Results  Component Value Date    WBC 6.61 04/14/2020    HGB 12.9 04/14/2020    HCT 38.6 04/14/2020    PLT 176 04/14/2020     Lab Results   Component Value Date    GLU 86 04/14/2020    BUN 10.0 04/14/2020    CREAT 0.8 04/14/2020    NA 139 04/14/2020    K 4.8 04/14/2020    CL 104 04/14/2020    CO2 26 04/14/2020    AST 20 04/14/2020    ALT 17 04/14/2020     Lab Results   Component Value Date    MG 1.9 08/07/2015    TSH 1.15 07/03/2019    HGBA1C 5.4 07/03/2019    BNP <10 05/30/2019     Lab Results   Component Value Date    CHOL 183 07/03/2019    TRIG 75 07/03/2019    HDL 57 07/03/2019    LDL 111 (H) 07/03/2019             Most recent echo and nuclear study reviewed.      IMPRESSION:   [name] [name] is a 55  y.o. male with the following problems:    1. History of chest pain syndrome normal ETT August 2021 vigorous exercise without symptoms  2. History of varicosities status post ablation postprocedural ultrasound without evidence of DVT  3. Tobacco use  4. History of polysubstance abuse  5. History of mild pulmonary hypertension on echocardiogram 08/16/2019      RECOMMENDATIONS:    [name] [name] appears stable from a cardiovascular standpoint we discussed tobacco cessation.  Plan office visit and repeat echocardiogram reevaluate pulmonary hypertension in 1 year he will call me in the interim should he have any additional symptoms                                                     Orders Placed This Encounter   Procedures    ECG 12 lead (Normal)    Echocardiogram Adult Complete W Clr/ Dopp Waveform    Office Visit ([facility] [place])       No orders of the defined types were placed in this encounter.      SIGNED:    Lysle Rubens, MD          This note was generated by the Dragon speech recognition and may contain errors or omissions not intended by the user. Grammatical errors, random word insertions, deletions, pronoun errors, and incomplete sentences are occasional consequences of this technology due to software limitations. Not all errors are caught or corrected. If there are questions or concerns about the content of this note or information contained within the body of this dictation, they should be addressed directly with the author for clarification.

## 2020-05-30 NOTE — Progress Notes (Signed)
[facility]   [address] [place], [place] [address]           [name], [place]    Date of Visit:  06/22/2019  Date of Birth: 08/03/1965  Age: 55 yrs.   Medical Record Number: [id number]  __  CURRENT DIAGNOSES     1. Pain In Left Leg, M79.605   2. Chest pain, precordial, R07.2  3. Abnormal electrocardiogram, R94.31  4. Encounter For Screening For Covid-19, Z11.52  __  ALLERGIES     Advil, Intolerance-unknown  Percocet, Intolerance-unknown  __  MEDICATIONS     1. No Meds   __  CHIEF COMPLAINT/REASON FOR VISIT  Hospital Follow up  __  HISTORY OF PRESENT ILLNESS   Patient is a 55 year old gentleman with a history of GERD, heroin and cocaine abuse who has had 4 emergency room visits for evaluation of chest pain since March 2021 through June 15, 2019. He was seen by Dr. Georgeanna Lea February 2021 and recommended to have  an echocardiogram as well as a treadmill nuclear study. Unfortunately the studies have not been completed.    Last emergency room visit was June 15, 2019. He was admitted from a detox center with chest pain. His work-up was unremarkable. He returns  today for follow-up. He has been on Prilosec since discharge. He still complains of intermittent left-sided chest and substernal chest discomfort. It is not necessarily with activity. He continues to smoke.    ECG: Normal sinus rhythm, left axis-anterior  fascicular block        __  PAST HISTORY     Past Medical Illnesses : "GI reflux", DVT;  Past Cardiac Illnesses: No previous history of cardiac disease.;  Infectious Diseases: Syphillis; Surgical Procedures: No previous surgical procedures.;  Trauma History: No previous history of significant trauma.; Cardiology Procedures-Invasive: No previous  interventional or invasive cardiology procedures.; Cardiology Procedures-Noninvasive: Echocardiogram June 2017, Echocardiogram July 2013;  Left Ventricular Ejection Fraction: LVEF of 55% documented via echocardiogram on 08/06/2015  ___   FAMILY  HISTORY  Father -- Unknown disease    __  CARDIAC RISK FACTORS     Tobacco  Abuse: currently smoking; Family History of Heart Disease: no family history of cardiovascular disease;  Hyperlipidemia: negative; Hypertension: negative;   Diabetes Mellitus: negative; Prior History of Heart Disease: positive;  Obesity: negative; Sedentary Life Style:negative;  MVH:QIONGEXB; Menopausal:not applicable  __   SOCIAL HISTORY    Alcohol Use: drinks regularly;  Smoking: smokes; Current every day smoker ([id number]); Diet: Regular diet without modifications and  Caffeine use-1-2 per day; Exercise: Exercises occasionally;   __  PHYSICAL EXAMINATION     Vital Signs:  Blood Pressure:  124/90 Sitting, Right arm, regular cuff    Weight:  168.00 lbs.  Height: 68.00"  BMI: 25.54    Pulse: 91/min.       Constitutional: Cooperative, alert and oriented,well developed, well  nourished, in no acute distress. Skin: Warm and dry to touch, no apparent skin lesions, or masses noted.  Head: Normocephalic, normal hair pattern, no masses or tenderness Eyes: EOMS Intact, PERRL, conjunctivae  and lids normal. Funduscopic exam and visual Boom not performed. ENT: Ears, Nose and throat reveal no gross abnormalities. No pallor or cyanosis. Dentition  good. Neck: No palpable masses or adenopathy, no thyromegaly, no JVD, carotid pulses are full and equal bilaterally without bruits.  Chest: Normal symmetry, no tenderness to palpation, normal respiratory excursion, no intercostal retraction, no use of accessory muscles, normal diaphragmatic excursion, clear  to auscultation and percussion.  Cardiac: Regular rhythm, S1 normal, S2 normal, No S3 or S4, Apical impulse not displaced, no murmurs, gallops or rubs detected. Abdomen : Abdomen soft, bowel sounds normoactive, no masses, no hepatosplenomegaly, non-tender, no bruits Peripheral Pulses: The femoral, popliteal, dorsalis  pedis, and posterior tibial pulses are full and equal bilaterally with no bruits  auscultated. Extremities/Back: lle varicosity  Neurological: No gross motor or sensory deficits noted, affect appropriate, oriented to time, person and place.   __    Medications added today by the physician:      (Enter Doctor  Dictated Impressions Here)     IMPRESSIONS:  1. Chest pain, fairly atypical.   2. Abnormal ECG.   3. Polysubstance abuse.   4. Tobacco use.   5. Left lower extremity varicosity. Negative D-dimer January 28th. Per the patient's  report, previous deep venous thrombosis.     RECOMMENDATIONS:    Given his chest pain and abnormal ECG, recommend he undergo exercise perfusion study. Is scheduled to have his echocardiogram eighth 11th. Continued detox off of polysubstance.  Recommend tobacco cessation. Follow-up in Dr. Georgeanna Lea in 1 month after studies are completed.  Tana Felts, NP      cc:   ____________________________  Christianne Dolin  COVID-19-SARS-CoV-2 Antigen/RT-PCR Test First Available   Treadmill Nuclear Study First Available  12 Lead ECG Today

## 2020-05-30 NOTE — Progress Notes (Signed)
[facility]   [address] [place], [place] [address]           [name] [name], [place]    Date of Visit:  04/10/2019  Date of Birth: 07-21-1965  Age: 55 yrs.   Medical Record Number: [id number]  __  CURRENT DIAGNOSES     1. Pain In Left Leg, M79.605   2. Chest pain, precordial, R07.2  3. Abnormal electrocardiogram, R94.31  4. Encounter For Screening For Covid-19, Z11.52  __  ALLERGIES     Advil, Intolerance-unknown  Percocet, Intolerance-unknown  __  MEDICATIONS     1. No Meds   __  CHIEF COMPLAINT/REASON FOR VISIT  Consult  __  HISTORY OF PRESENT ILLNESS   [name] [name] was referred by the [facility] Emergency Room for the evaluation of chest pain. He is a 55 year old gentleman with a history of a chest pain syndrome, last stress test with Dr. Philip Aspen in 2017 was a normal nuclear perfusion study.  He has a history of a varicosity in his left lower extremity and he describes a history of a DVT. He was seen in the Emergency Room two weeks ago. Chest x-ray, D-dimer were negative. He was therefore discharged home for followup.     He has a history  of tobacco use as well as polysubstance abuse including cocaine and heroin. He has been using cocaine occasionally of late.   __  PAST HISTORY      Past Medical Illnesses: "GI reflux", DVT;  Past Cardiac Illnesses : No previous history of cardiac disease.; Infectious Diseases: Syphillis; Surgical Procedures : No previous surgical procedures.; Trauma History: No previous history of significant trauma.; Cardiology  Procedures-Invasive: No previous interventional or invasive cardiology procedures.; Cardiology Procedures-Noninvasive : Echocardiogram June 2017, Echocardiogram July 2013; Left Ventricular Ejection Fraction: LVEF of 55% documented via echocardiogram on 08/06/2015   ___  FAMILY HISTORY  Father -- Unknown disease     __  CARDIAC RISK FACTORS     Tobacco Abuse: currently smoking;  Family History of Heart Disease: no family history of  cardiovascular disease; Hyperlipidemia: negative;  Hypertension: negative;  Diabetes Mellitus: negative;  Prior History of Heart Disease: positive; Obesity: negative;  Sedentary Life Style:negative; QQV:ZDGLOVFI; Menopausal :not applicable  __  SOCIAL HISTORY    Alcohol Use : drinks regularly; Smoking: smokes; Current every day smoker ([id number]); Diet : Regular diet without modifications and Caffeine use-1-2 per day; Exercise: Exercises occasionally;   __   REVIEW OF SYSTEMS    General: fatigue;  Integumentary: Denies any change in hair or nails, rashes, or skin lesions.; Eyes : wears eye glasses/contact lenses; Ears, Nose, Throat, Mouth: Denies any hearing loss, epistaxis, hoarseness  or difficulty speaking.;Respiratory: sleep apnea;  Cardiovascular: Please review HPI, chest discomfort; Abdominal  : Denies ulcer disease, hematochezia or melena.;Musculoskeletal: Denies any venous insufficiency, arthritic symptoms or back problems.; Neurological : headaches;  Psychiatric: anxiety; Endocrine:  Denies any weight change, heat/cold intolerance, polydipsia, or polyuria; Hematologic/Immunologic: Denies  any food allergies, seasonal allergies, bleeding disorders.  __  PHYSICAL EXAMINATION    Vital  Signs:  Blood Pressure:  134/78 Sitting, Left arm, regular cuff  132/78 Sitting, Right arm, regular  cuff    Weight: 183.80 lbs.  Height: 68"   BMI: 27.94   Pulse: 61/min.   Respirations:  14/min.       Constitutional: Cooperative, alert and oriented,well developed, well nourished,  in no acute distress. Skin: Warm and dry to touch, no apparent skin lesions, or  masses noted. Head : Normocephalic, normal hair pattern, no masses or tenderness Eyes: EOMS Intact, PERRL, conjunctivae and  lids normal. Funduscopic exam and visual Regan not performed. ENT: Ears, Nose and throat reveal no gross  abnormalities. No pallor or cyanosis. Dentition good. Neck: No palpable masses or adenopathy, no thyromegaly,  no JVD, carotid pulses are full  and equal bilaterally without bruits. Chest: Normal symmetry, no tenderness to palpation, normal respiratory excursion,  no intercostal retraction, no use of accessory muscles, normal diaphragmatic excursion, clear to auscultation and percussion. Cardiac: Regular rhythm,  S1 normal, S2 normal, No S3 or S4, Apical impulse not displaced, no murmurs, gallops or rubs detected. Abdomen: Abdomen soft, bowel sounds normoactive,  no masses, no hepatosplenomegaly, non-tender, no bruits Peripheral Pulses: The femoral, popliteal, dorsalis  pedis, and posterior tibial pulses are full and equal bilaterally with no bruits auscultated. Extremities/Back: lle varicosity  Neurological: No gross motor or sensory deficits noted, affect appropriate, oriented to time, person and place.   __    Medications added today  by the physician:    ECG:   Sinus rhythm with a left anterior fascicular block.     IMPRESSIONS:   1. Chest pain, fairly atypical.   2. Abnormal ECG.   3. Polysubstance abuse.   4. Tobacco use.   5. Left lower extremity varicosity. Negative D-dimer January 28th. Per the patient's report, previous deep venous thrombosis.      PLANS:   1. With regard to his chest pain and abnormal ECG, plan exercise perfusion study and echocardiogram.   2. With regard to his lower extremity pain and varicosity, plan lower extremity ultrasound.    3. Counseled to seek professional help regarding polysubstance abuse.   4. Complete tobacco cessation.   5. The patient has inquired what he can do about the varicosity. I have suggested he contact the interventional radiology team at [facility].   6. I will see him again in a month to review unless above-mentioned studies suggest further evaluation is needed.     Thank you for allowing me to participate in his care. Feel free to contact me with any questions.     Rogelio Seen, MD, [facility]    JJ/tutlc   ____________________________  TODAYS ORDERS  2D, color flow, doppler  echocardiogram First Available   Diet mgmt edu, guidance and counseling TODAY  Smoking cessation education TODAY  COVID-19-SARS-CoV-2 Antigen/RT-PCR Test First Available  Treadmill Nuclear Study First Available  12 Lead ECG Today  Return Visit 15 MIN 1 month   LE Venous Doppler-Patency First Available

## 2020-05-31 ENCOUNTER — Encounter (INDEPENDENT_AMBULATORY_CARE_PROVIDER_SITE_OTHER): Payer: 59 | Admitting: Nurse Practitioner

## 2020-06-01 ENCOUNTER — Emergency Department: Payer: 59

## 2020-06-01 ENCOUNTER — Emergency Department
Admission: EM | Admit: 2020-06-01 | Discharge: 2020-06-01 | Disposition: A | Discharge status: Home / Self Care | Payer: 59 | Attending: Emergency Medicine | Admitting: Emergency Medicine

## 2020-06-01 DIAGNOSIS — R079 Chest pain, unspecified: Secondary | ICD-10-CM

## 2020-06-01 DIAGNOSIS — H7392 Unspecified disorder of tympanic membrane, left ear: Secondary | ICD-10-CM

## 2020-06-01 DIAGNOSIS — R0789 Other chest pain: Secondary | ICD-10-CM

## 2020-06-01 DIAGNOSIS — H73892 Other specified disorders of tympanic membrane, left ear: Secondary | ICD-10-CM | POA: Insufficient documentation

## 2020-06-01 DIAGNOSIS — F17210 Nicotine dependence, cigarettes, uncomplicated: Secondary | ICD-10-CM | POA: Insufficient documentation

## 2020-06-01 LAB — CBC AND DIFFERENTIAL
Absolute NRBC: 0 10*3/uL (ref 0.00–0.00)
Basophils Absolute Automated: 0.06 10*3/uL (ref 0.00–0.08)
Basophils Automated: 0.8 %
Eosinophils Absolute Automated: 0.16 10*3/uL (ref 0.00–0.44)
Eosinophils Automated: 2.2 %
Hematocrit: 36.6 % — ABNORMAL LOW (ref 37.6–49.6)
Hgb: 12.3 g/dL — ABNORMAL LOW (ref 12.5–17.1)
Immature Granulocytes Absolute: 0.02 10*3/uL (ref 0.00–0.07)
Immature Granulocytes: 0.3 %
Lymphocytes Absolute Automated: 0.97 10*3/uL (ref 0.42–3.22)
Lymphocytes Automated: 13.3 %
MCH: 31.5 pg (ref 25.1–33.5)
MCHC: 33.6 g/dL (ref 31.5–35.8)
MCV: 93.8 fL (ref 78.0–96.0)
MPV: 11.7 fL (ref 8.9–12.5)
Monocytes Absolute Automated: 0.53 10*3/uL (ref 0.21–0.85)
Monocytes: 7.3 %
Neutrophils Absolute: 5.53 10*3/uL (ref 1.10–6.33)
Neutrophils: 76.1 %
Nucleated RBC: 0 /100 WBC (ref 0.0–0.0)
Platelets: 144 10*3/uL (ref 142–346)
RBC: 3.9 10*6/uL — ABNORMAL LOW (ref 4.20–5.90)
RDW: 13 % (ref 11–15)
WBC: 7.27 10*3/uL (ref 3.10–9.50)

## 2020-06-01 LAB — COMPREHENSIVE METABOLIC PANEL
ALT: 14 U/L (ref 0–55)
AST (SGOT): 18 U/L (ref 5–34)
Albumin/Globulin Ratio: 1.3 (ref 0.9–2.2)
Albumin: 3.9 g/dL (ref 3.5–5.0)
Alkaline Phosphatase: 69 U/L (ref 37–117)
Anion Gap: 5 (ref 5.0–15.0)
BUN: 9 mg/dL (ref 9.0–28.0)
Bilirubin, Total: 0.6 mg/dL (ref 0.2–1.2)
CO2: 26 mEq/L (ref 22–29)
Calcium: 9.1 mg/dL (ref 8.5–10.5)
Chloride: 107 mEq/L (ref 100–111)
Creatinine: 0.8 mg/dL (ref 0.7–1.3)
Globulin: 2.9 g/dL (ref 2.0–3.6)
Glucose: 108 mg/dL — ABNORMAL HIGH (ref 70–100)
Potassium: 4 mEq/L (ref 3.5–5.1)
Protein, Total: 6.8 g/dL (ref 6.0–8.3)
Sodium: 138 mEq/L (ref 136–145)

## 2020-06-01 LAB — HEMOLYSIS INDEX: Hemolysis Index: 22 (ref 0–24)

## 2020-06-01 LAB — ECG 12-LEAD
Atrial Rate: 80 {beats}/min
P Axis: 58 degrees
P-R Interval: 156 ms
Q-T Interval: 380 ms
QRS Duration: 92 ms
QTC Calculation (Bezet): 438 ms
R Axis: -39 degrees
T Axis: 34 degrees
Ventricular Rate: 80 {beats}/min

## 2020-06-01 LAB — GFR: EGFR: >60

## 2020-06-01 LAB — TROPONIN I: Troponin I: <0.01 ng/mL (ref 0.00–0.05)

## 2020-06-01 MED ORDER — AZITHROMYCIN 250 MG PO TABS
ORAL_TABLET | ORAL | 0 refills | Status: AC
Start: 2020-06-01 — End: 2020-06-06

## 2020-06-01 NOTE — ED Provider Notes (Signed)
[facility] EMERGENCY DEPARTMENT H&P           CLINICAL INFORMATION        HPI:      Chief Complaint: Chest Pain  .    [name] [name] is a 55 y.o. male who presents with chest pain.  Sharp, fleeting, started approximately 45 minutes prior to arrival.  No radiation, no associated with exertion, was resting in bed when it started.  Seems to be resolving now.    Patient also notes secondary concern of left ear pain, intermittent.  Has had a course of antibiotics without resolution.  No change in hearing but notes a fullness feeling.  No neck pain, mastoid tenderness.  Improved with course of amoxicillin    History obtained from: Patient          ROS:      Pertinent positive and negative review of systems elements noted in the HPI.  All other systems reviewed and negative.        Physical Exam:      Vitals:    06/01/20 0331   BP: 141/87   Pulse: 79   Resp: (!) 24   Temp: 98.7 F (37.1 C)   SpO2: 97%   Weight: 80.3 kg   Height: 5\' 8"  (1.727 m)       Nursing notes and vitals reviewed.     Constitutional: alert, no acute distress  Eyes: EOM intact, conjugate gaze, vision grossly intact  HEENT: Neck supple with normal ROM, normal appearing TM with mild erythema around anvil insertion.  No purulence or bulging.  Cardiac: Warm and well perfused, normal S1 and S2, no murmurs noted  Respiratory: Normal rate, No increased work of breathing, clear to auscultation bilaterally  Abdomen/GI: soft and nondistended, nontender no guarding or rebound, no CVA tenderness   Neurologic: alert, oriented, conversant, moves all extremities with 5/5 strength bilaterally, sensation grossly intact, stable gait  MSK: No deformities or crepitus, full ROM of joints without pain or limitation  Skin: warm and dry, no obvious rashes  Psychiatric: pleasant and cooperative, linear organized thought process.                 PAST HISTORY        Primary Care Provider: [name], MD        PMH/PSH:    .     Past  Medical History:   Diagnosis Date    Echocardiogram 07/2015, 08/2011    Gallstones     Gastritis     Heart murmur     Heroin abuse     Syphilis        He has no past surgical history on file.      Social/Family History:      He reports that he has been smoking cigarettes. He has a 2.50 pack-year smoking history. He has never used smokeless tobacco. He reports previous alcohol use. He reports previous drug use. Drugs: Marijuana and Cocaine.    Family History   Problem Relation Age of Onset    Diabetes Mother     Hypertension Mother     Other Father         Unknown disease         Listed Medications on Arrival:    .     Home Medications     None on File         Allergies: He is allergic to nsaids, gluten, percocet [oxycodone-acetaminophen], and tylenol [acetaminophen].  VISIT INFORMATION        Medications Given in the ED:    .     ED Medication Orders (From admission, onward)    None            Procedures:      Procedures      Interpretations:      O2 sat-           saturation: 97 %; Oxygen use: room air; Interpretation: Normal                    RESULTS        Lab Results:      Results     Procedure Component Value Units Date/Time    Troponin I [[id number]] Collected: 06/01/20 0349    Specimen: Blood Updated: 06/01/20 0436     Troponin I <0.01 ng/mL     Comprehensive metabolic panel [[id number]]  (Abnormal) Collected: 06/01/20 0349    Specimen: Blood Updated: 06/01/20 0431     Glucose 108 mg/dL      BUN 9.0 mg/dL      Creatinine 0.8 mg/dL      Sodium 782 mEq/L      Potassium 4.0 mEq/L      Chloride 107 mEq/L      CO2 26 mEq/L      Calcium 9.1 mg/dL      Protein, Total 6.8 g/dL      Albumin 3.9 g/dL      AST (SGOT) 18 U/L      ALT 14 U/L      Alkaline Phosphatase 69 U/L      Bilirubin, Total 0.6 mg/dL      Globulin 2.9 g/dL      Albumin/Globulin Ratio 1.3     Anion Gap 5.0    GFR [[id number]] Collected: 06/01/20 0349     Updated: 06/01/20 0431     EGFR >60.0    Hemolysis index [[id number]] Collected:  06/01/20 0349     Updated: 06/01/20 0431     Hemolysis Index 22    CBC and differential [[id number]]  (Abnormal) Collected: 06/01/20 0349    Specimen: Blood Updated: 06/01/20 0412     WBC 7.27 x10 3/uL      Hgb 12.3 g/dL      Hematocrit 01.0 %      Platelets 144 x10 3/uL      RBC 3.90 x10 6/uL      MCV 93.8 fL      MCH 31.5 pg      MCHC 33.6 g/dL      RDW 13 %      MPV 11.7 fL      Neutrophils 76.1 %      Lymphocytes Automated 13.3 %      Monocytes 7.3 %      Eosinophils Automated 2.2 %      Basophils Automated 0.8 %      Immature Granulocytes 0.3 %      Nucleated RBC 0.0 /100 WBC      Neutrophils Absolute 5.53 x10 3/uL      Lymphocytes Absolute Automated 0.97 x10 3/uL      Monocytes Absolute Automated 0.53 x10 3/uL      Eosinophils Absolute Automated 0.16 x10 3/uL      Basophils Absolute Automated 0.06 x10 3/uL      Immature Granulocytes Absolute 0.02 x10 3/uL      Absolute NRBC 0.00 x10 3/uL  Radiology Results:      XR Chest 2 Views    (Results Pending)              Visit date: 06/01/2020      CLINICAL SUMMARY           Diagnosis:    .     Final diagnoses:   Chest pain, atypical   Tympanic membrane irritation, left         MDM/ED Course:      55 year old male with sharp chest pain.  Recent cardiac work-up.  Exam unrevealing.  Regularly exercises without chest pain.  Secondary complaint of left ear pain.  Had previous improved with antibiotics.  Redness on the tympanic membrane without bulging or purulence    DDX    Ddx includes ACS, PE, pneumonia, pneumothorax, pericarditis, GERD/esophageal spasm, chest wall pain, pleurisy    Aortic dissection - unlikely based on stable presentation, lack of radiation, "tearing" quality, neuro symptoms.   Myocarditis - unlikely with no signs of heart failure, EKG changes, fever.    ACS unlikely given recent negative work-up, description of pain, EKG and troponin negative.    Think PE unlikely.    Normal pulmonary exam.    Follow-up with ENT.  Will trial another course of  antibiotics.    \Discussed results, return instructions, follow-up with cardiology and ENT.    Patient expressed understanding, discharged in stable condition.            Disposition:      Discharge to home/self care to follow up with cardiology and ENT.               Scribe Attestation:      I was acting as a Neurosurgeon for Wandra Feinstein, MD on [facility]    I am the first provider for this patient and I personally performed the services documented. Rolly Pancake is scribing for me on [facility]. This note and the patient instructions accurately reflect work and decisions made by me.  Wandra Feinstein, MD       Parts of this note were generated by the Epic EMR system/ Dragon speech recognition and may contain inherent errors or omissions not intended by the user. Grammatical errors, random word insertions, deletions, pronoun errors and incomplete sentences are occasional consequences of this technology due to software limitations. Not all errors are caught or corrected. If there are questions or concerns about the content of this note or information contained within the body of this dictation they should be addressed directly with the author for clarification.

## 2020-06-01 NOTE — Discharge Instructions (Addendum)
Dear [name] [name]:    Thank you for choosing the [facility] Emergency Department, the premier emergency department in the [place] area.  I hope your visit today was EXCELLENT. You will receive a survey via text message that will give you the opportunity to provide feedback to your team about your visit. Please do not hesitate to reach out with any questions!    Specific instructions for your visit today:    Chest Pain of Unclear Etiology     You have been seen for chest pain. The cause of your pain is not yet known.     Your doctor has learned about your medical history, examined you, and checked any tests that were done. Still, it is not clear why you are having pain. The doctor thinks there is only a small chance that your pain is caused by a health problem that could lead to serious harm or death. Later, your primary care doctor might do more tests or check you again.     Sometimes chest pain is caused by a health problem that can lead to death, like a:  Heart attack.  Injury to the large blood vessel in your body (aorta).  Blood clot in the lung.  Collapsed lung.      It is not likely that your pain is caused by a health problem that could lead to death if:   Your chest pain lasts only a few seconds at a time  You are not short of breath, nauseated (sick to your stomach), sweaty, or lightheaded  Your pain gets worse when you twist or bend  Your pain improves with exercise or hard work.     Chest pain is serious. It is very important that you follow up with your regular doctor.     Return here or go to the nearest Emergency Department immediately if:  Your pain makes you short of breath, nauseated (sick to your stomach), or sweaty.  Your pain gets worse when you walk, go up stairs, or exert yourself.  You feel weak, lightheaded, or faint.  It hurts to breathe.  Your leg swells.  Your pain or symptoms get worse   You have new symptoms or concerns.     If you can't follow up with your doctor, or if at any time  you feel you need to be rechecked or seen again, come back here or go to the nearest emergency department.      IF YOU DO NOT CONTINUE TO IMPROVE OR YOUR CONDITION WORSENS, PLEASE CONTACT YOUR DOCTOR OR RETURN IMMEDIATELY TO THE EMERGENCY DEPARTMENT.    Sincerely,  Solberg, Knute Neu, MD  Attending Emergency Physician  [facility] Emergency Department      OBTAINING A PRIMARY CARE APPOINTMENT    Primary care physicians (PCPs, also known as primary care doctors) are either internists or family medicine doctors. Both types of PCPs focus on health promotion, disease prevention, patient education and counseling, and treatment of acute and chronic medical conditions.    If you need a primary care doctor, please call the below number and ask who is receiving new patients.     [facility]  Telephone:  [phone]  [email or web address]    DOCTOR REFERRALS  Call [phone] (available 24 hours a day, 7 days a week) if you need any further referrals and we can help you find a primary care doctor or specialist.  Also, available online at:  [email or web address]    YOUR CONTACT  INFORMATION  Before leaving please check with registration to make sure we have an up-to-date contact number.  You can call registration at [phone] to update your information.  For questions about your hospital bill, please call [phone].  For questions about your Emergency Dept Physician bill please call [phone].      FREE HEALTH SERVICES  If you need help with health or social services, please call 2-1-1 for a free referral to resources in your area.  2-1-1 is a free service connecting people with information on health insurance, free clinics, pregnancy, mental health, dental care, food assistance, housing, and substance abuse counseling.  Also, available online at:  http://www.211[place].org    ORTHOPEDIC INJURY   Please know that significant injuries can exist even when an initial x-ray is read  as normal or negative.  This can occur because some fractures (broken bones) are not initially visible on x-rays.  For this reason, close outpatient follow-up with your primary care doctor or bone specialist (orthopedist) is required.    MEDICATIONS AND FOLLOWUP  Please be aware that some prescription medications can cause drowsiness.  Use caution when driving or operating machinery.    The examination and treatment you have received in our Emergency Department is provided on an emergency basis, and is not intended to be a substitute for your primary care physician.  It is important that your doctor checks you again and that you report any new or remaining problems at that time.      ASSISTANCE WITH INSURANCE    Affordable Care Act  [facility])  Call to start or finish an application, compare plans, enroll or ask a question.  [phone]  TTY: [phone]  Web:  Healthcare.gov    Help Enrolling in [facility]  [employer] [place]  ([phone] (TOLL-FREE)  [phone] (TTY)  Web:  Http://www.coverva.org    Local Help Enrolling in the [facility]  [place] [facility]  [phone] (MAIN)  Email:  health-help@nvfs .org  Web:  BlackjackMyths.is  Address:  [address], [address] [place], [place] [address]    SEDATING MEDICATIONS  Sedating medications include strong pain medications (e.g. narcotics), muscle relaxers, benzodiazepines (used for anxiety and as muscle relaxers), Benadryl/diphenhydramine and other antihistamines for allergic reactions/itching, and other medications.  If you are unsure if you have received a sedating medication, please ask your physician or nurse.  If you received a sedating medication: DO NOT drive a car. DO NOT operate machinery. DO NOT perform jobs where you need to be alert.  DO NOT drink alcoholic beverages while taking this medicine.     If you get dizzy, sit or lie down at the first signs. Be careful going up and down stairs.  Be extra careful to prevent falls.     Never give  this medicine to others.     Keep this medicine out of reach of children.     Do not take or save old medicines. Throw them away when outdated.     Keep all medicines in a cool, dry place. DO NOT keep them in your bathroom medicine cabinet or in a cabinet above the stove.    MEDICATION REFILLS  Please be aware that we cannot refill any prescriptions through the ER. If you need further treatment from what is provided at your ER visit, please follow up with your primary care doctor or your pain management specialist.    FREESTANDING EMERGENCY DEPARTMENTS OF [facility]  Did you know [facility] has two freestanding  ERs located just a few miles away?  [facility] [place] have short wait times, easy free parking directly in front of the building and top patient satisfaction scores - and the same Board Certified Emergency Medicine doctors as [facility].

## 2020-06-01 NOTE — ED Notes (Signed)
MD at bedside. 

## 2020-06-01 NOTE — ED Notes (Signed)
Bed: S 11  Expected date:   Expected time:   Means of arrival: [id number] - Teola Bradley  Comments:  Medic 413 to RM 11

## 2020-06-01 NOTE — ED Triage Notes (Signed)
Pt presents to the ED for sharp chest pain that started this morning while he was resting. Also c/o bilateral ear pain. Pt denies N/V. States stress test that was done 6 months ago was normal. Pt alert and oriented.

## 2020-06-06 DIAGNOSIS — J329 Chronic sinusitis, unspecified: Secondary | ICD-10-CM | POA: Insufficient documentation

## 2020-06-07 ENCOUNTER — Emergency Department
Admission: EM | Admit: 2020-06-07 | Discharge: 2020-06-07 | Disposition: A | Discharge status: Home / Self Care | Payer: 59 | Attending: Emergency Medicine | Admitting: Emergency Medicine

## 2020-06-07 ENCOUNTER — Emergency Department: Payer: 59

## 2020-06-07 DIAGNOSIS — R0981 Nasal congestion: Secondary | ICD-10-CM

## 2020-06-07 DIAGNOSIS — J328 Other chronic sinusitis: Secondary | ICD-10-CM

## 2020-06-07 LAB — I-STAT EG7 VENOUS CARTRIDGE
Hematocrit I-Stat: 38 % — ABNORMAL LOW (ref 42.0–52.0)
Hemoglobin I-Stat: 12.9 g/dL — ABNORMAL LOW (ref 13.0–17.0)
Potassium I-Stat: 3.8 mEq/L (ref 3.5–4.9)
Sodium I-Stat: 142 mEq/L (ref 138–146)
i-STAT Base Excess Venous: 6 mEq/L
i-STAT Calcium Ionized: 2.5 mEq/L (ref 2.24–2.64)
i-STAT Doctor Notified: 1
i-STAT FIO2: 100
i-STAT HCO3 Bicarbonate Venous: 32.6 mEq/L
i-STAT O2 Saturation Venous: 39 %
i-STAT Patient Temperature: 98.8
i-STAT Total CO2 Venous: 34 mEq/L — ABNORMAL HIGH (ref 24.0–29.0)
i-STAT pCO2 Venous: 54.5
i-STAT pH Venous: 7.385
i-STAT pO2 Venous: 24

## 2020-06-07 MED ORDER — GUAIFENESIN ER 600 MG PO TB12
600.0000 mg | ORAL_TABLET | Freq: Once | ORAL | Status: DC
Start: 2020-06-07 — End: 2020-06-07
  Filled 2020-06-07: qty 1

## 2020-06-07 MED ORDER — AMOXICILLIN-POT CLAVULANATE 875-125 MG PO TABS
1.0000 | ORAL_TABLET | Freq: Once | ORAL | Status: AC
Start: 2020-06-07 — End: 2020-06-07
  Administered 2020-06-07: 03:00:00 1 via ORAL
  Filled 2020-06-07: qty 1

## 2020-06-07 MED ORDER — AMOXICILLIN-POT CLAVULANATE 875-125 MG PO TABS
1.0000 | ORAL_TABLET | Freq: Two times a day (BID) | ORAL | 0 refills | Status: AC
Start: 2020-06-07 — End: 2020-06-14

## 2020-06-07 NOTE — Progress Notes (Signed)
Patient d/c at [facility]. Patient presented to ED triage at 6:45AM requesting transport. Patient given 1 bus token.    Lorenza Cambridge MSW  Emergency Department  Social Work Case Manager I   [employer]  [phone]

## 2020-06-07 NOTE — ED Provider Notes (Signed)
[facility] EMERGENCY DEPARTMENT  ATTENDING PHYSICIAN HISTORY AND PHYSICAL EXAM     Patient Name: [name] [name], [name] [name]  Encounter Date:  06/06/2020  Attending Physician: Theda Sers, MD  Room:  N T2/N T2  Patient DOB:  [birth year]  Age: 55 y.o. male  MRN:  [id number]  PCP: [name], MD         Diagnosis/Disposition:     Final Impression  Final diagnoses:   Head congestion   Other chronic sinusitis     Disposition  ED Disposition     ED Disposition   Discharge    Condition   --    Date/Time   Tue Jun 07, 2020  2:46 AM    Comment   Sheran Fava Mavity discharge to home/self care.    Condition at disposition: Stable             Follow up  Alleen Borne, MD  [address]  [place] [address]  [phone]          Prescriptions  Discharge Medication List as of 06/07/2020  2:46 AM      START taking these medications    Details   amoxicillin-clavulanate (AUGMENTIN) 875-125 MG per tablet Take 1 tablet by mouth 2 (two) times daily for 7 days, Starting Tue 06/07/2020, Until Tue 06/14/2020, E-Rx                 MDM:      Initial Differential Diagnosis:   Initial differential diagnosis to include but not limited to: viral illness (upper respiratory infection), sinusitis, otitis media (viral vs bacterial)    Plan:   Labs, CT Head, mucinex    Final Impression:   Patient is a 55 year old male who presents for the second time for feeling of head congestion and fullness on the left side.  Patient states that the amoxicillin he was prescribed on April 6 did not help his symptoms.  He reports subjective fevers chills.  Exam is unremarkable, there is minimal bulging of both TMs without erythema or evidence of purulence.  There are no abnormalities to the exam of the head, face or neck.  Patient states he is quite concerned and needs something to be done to prove he is infection.  I told him we will check basic labs and get a CAT scan but it would likely not reveal anything and he might be just having allergic or viral  symptoms.   The patient was deemed stable for discharge. They were given strict return precautions as it relates to their presumed diagnosis, verbalized understanding of these precautions and agreed to follow up as instructed. All questions were answered prior to discharge.         The patient's past medical records, including those in Care Everywhere when necessary, were reviewed by me.    This patient was seen and evaluated during the SARS-CoV-2 pandemic.The following personal protective equipment was used by me in the care of this patient: disposable N95 mask, protective eyewear, and gloves.         History of Presenting Illness:     Nursing Triage note: Pt ambulatory in triage c/o bilateral ear pain for "months" estimated 4 months, pt endorses multiple abx treatments last abx course taken yesterday. pt states subjective fevers denies any other adverse symptoms at this time. no hx stated    Chief complaint: Otalgia    HPI  [name] [name] is a 55 y.o. male w/PMH of polysubstance abuse (  heroin, cocaine, ETOH), heart murmur, syphilis, cholelithiasis who presents with persistent left-sided head congestion and pressure.  Patient was seen in this emergency department on April 6 and started on a short course of amoxicillin, which she states has not helped his symptoms.  He reports subjective fevers and chills.  He denies history of recurrent ear infections.  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  He had a negative stress test in August 2021 and has fairly routine cardiac follow-up.  Patient denies visual changes, dizziness, lightheadedness or neurologic deficits.  No nausea no vomiting no diarrhea no dysuria.        Review of Systems:  Physical Exam:     Review of Systems   Constitutional: Positive for chills and fever.   Eyes: Negative for visual disturbance.   Respiratory: Negative for shortness of breath.    Cardiovascular: Negative for chest pain.    Gastrointestinal: Negative for abdominal pain, diarrhea, nausea and vomiting.   Genitourinary: Negative for dysuria.   Neurological: Positive for headaches. Negative for dizziness and light-headedness.   All other systems reviewed and are negative.      All other systems reviewed and negative except what is specifically documented above.     Pulse 96   BP 146/82   Resp 18   SpO2 100 %   Temp 98.8 F (37.1 C)     Physical Exam  Vitals and nursing note reviewed.   Constitutional:       General: He is not in acute distress.     Appearance: Normal appearance.   HENT:      Head: Normocephalic and atraumatic.      Nose: Nose normal.      Mouth/Throat:      Mouth: Mucous membranes are moist.   Eyes:      Pupils: Pupils are equal, round, and reactive to light.   Cardiovascular:      Rate and Rhythm: Normal rate.      Pulses: Normal pulses.      Heart sounds: No murmur heard.  Pulmonary:      Effort: Pulmonary effort is normal. No respiratory distress.   Abdominal:      General: Abdomen is flat. Bowel sounds are normal.      Tenderness: There is no abdominal tenderness.   Musculoskeletal:         General: Normal range of motion.      Cervical back: Normal range of motion.   Skin:     General: Skin is warm and dry.      Capillary Refill: Capillary refill takes less than 2 seconds.   Neurological:      General: No focal deficit present.      Mental Status: He is alert.      Sensory: No sensory deficit.      Motor: No weakness.   Psychiatric:         Behavior: Behavior normal.              Diagnostic Results:     Laboratory Studies:    All lab values have been personally reviewed by me    Results     Procedure Component Value Units Date/Time    i-Stat EG7 Venous CartrIDge [[id number]]  (Abnormal) Collected: 06/07/20 0228     Updated: 06/07/20 0232     i-STAT pH Venous 7.385     i-STAT pCO2 Venous 54.5     i-STAT pO2 Venous 24.0  i-STAT HCO3 Bicarbonate Venous 32.6 mEq/L      i-STAT Total CO2 Venous 34.0 mEq/L      i-STAT  Base Excess Venous 6.0 mEq/L      i-STAT O2 Saturation Venous 39.0 %      i-STAT Calcium Ionized 2.50 mEq/L      Sodium I-Stat 142 mEq/L      Potassium I-Stat 3.8 mEq/L      Hemoglobin I-Stat 12.9 g/dL      Hematocrit I-Stat 38.0 %      i-STAT Patient Temperature 98.8 F     i-STAT FIO2 100     i-STAT Allen's Test NA     i-STAT Doctor Notified 1     i-STAT Draw Site Venous          Radiology Studies:    All images have been personally viewed by me    CT Head without Contrast   Final Result    Examination shows no hydrocephalus, herniation, or   intracranial hemorrhage. There is no visible acute intracranial   abnormality. When compared with the prior exam, there is no significant   change in the appearance of the brain. Sinus disease compatible with   chronic sinusitis.      Miguel Dibble, MD    06/07/2020 2:30 AM              Interpretations, Clinical Decision Tools and Critical Care:     O2 sat-           saturation: 100 %; Oxygen use: room air; Interpretation: Normal                  Procedures:   Procedures        Orders Placed During This Visit:     Encounter Orders:  Orders Placed This Encounter   Procedures    CT Head without Contrast    i-Stat EG7 Venous CartrIDge       Encounter Medications:  Medications   amoxicillin-clavulanate (AUGMENTIN) 875-125 MG per tablet 1 tablet (1 tablet Oral Given 06/07/20 0256)           Allergies & Medications:     Allergies:  Heis allergic to nsaids, gluten, percocet [oxycodone-acetaminophen], and tylenol [acetaminophen].    Home Medications     Med List Status: In Progress Set By: Levan Hurst, RN at 06/07/2020  2:43 AM                azithromycin (ZITHROMAX) 250 MG tablet (Expired)     Take 2 tablets (500 mg total) by mouth daily for 1 day, THEN 1 tablet (250 mg total) daily for 4 days.               Past History:     Medical:   Past Medical History:   Diagnosis Date    Echocardiogram 07/2015, 08/2011    Gallstones     Gastritis     Heart murmur     Heroin abuse      Syphilis        Surgical: He has no past surgical history on file.    Family:   Family History   Problem Relation Age of Onset    Diabetes Mother     Hypertension Mother     Other Father         Unknown disease       Social: He reports that he has been smoking cigarettes. He has a 2.50 pack-year smoking history. He has  never used smokeless tobacco. He reports previous alcohol use. He reports previous drug use. Drugs: Marijuana and Cocaine.        ATTESTATIONS     Theda Sers, MD    Scribe Attestation:    I was acting as a scribe for Randol Kern, MD on [facility] R   Treatment Team: Scribe: Rockie Neighbours. Janee Morn     I am the first provider for this patient and I personally performed the services documented. Treatment Team: Scribe: Rockie Neighbours. Janee Morn is scribing for me on [facility] R .This note and the patient instructions accurately reflect work and decisions made by me.  Randol Kern, MD    Documentation Notes:    Parts of this note were generated by the Epic EMR system/ Dragon speech recognition and may contain inherent errors or omissions not intended by the user. Grammatical errors, random word insertions, deletions, pronoun errors and incomplete sentences are occasional consequences of this technology due to software limitations. Not all errors are caught or corrected.    My documentation is often completed after the patient is no longer under my clinical care. In some cases, the Epic EMR may pull updated results into the above documentation which may not reflect all results or information that was available to me at the time of my medical decision making.     If there are questions or concerns about the content of this note or information contained within the body of this dictation they should be addressed directly with the author for clarification.                    Randol Kern, MD  06/07/20 [phone]

## 2020-06-07 NOTE — Discharge Instructions (Signed)
Dear Mr. Zahm:    Thank you for choosing the [facility] Emergency Department, the premier emergency department in the [place] area.  I hope your visit today was EXCELLENT. You will receive a survey via text message that will give you the opportunity to provide feedback to your team about your visit. Please do not hesitate to reach out with any questions!    Specific instructions for your visit today:    Please follow up with ENT    IF YOU DO NOT CONTINUE TO IMPROVE OR YOUR CONDITION WORSENS, PLEASE CONTACT YOUR DOCTOR OR RETURN IMMEDIATELY TO THE EMERGENCY DEPARTMENT.    Sincerely,  Randol Kern, MD  Attending Emergency Physician  [facility] Emergency Department      OBTAINING A PRIMARY CARE APPOINTMENT    Primary care physicians (PCPs, also known as primary care doctors) are either internists or family medicine doctors. Both types of PCPs focus on health promotion, disease prevention, patient education and counseling, and treatment of acute and chronic medical conditions.    If you need a primary care doctor, please call the below number and ask who is receiving new patients.     [facility]  Telephone:  [phone]  [email or web address]    DOCTOR REFERRALS  Call [phone] (available 24 hours a day, 7 days a week) if you need any further referrals and we can help you find a primary care doctor or specialist.  Also, available online at:  [email or web address]    YOUR CONTACT INFORMATION  Before leaving please check with registration to make sure we have an up-to-date contact number.  You can call registration at [phone] to update your information.  For questions about your hospital bill, please call [phone].  For questions about your Emergency Dept Physician bill please call [phone].      FREE HEALTH SERVICES  If you need help with health or social services, please call 2-1-1 for a free referral to resources in your area.  2-1-1 is a free service  connecting people with information on health insurance, free clinics, pregnancy, mental health, dental care, food assistance, housing, and substance abuse counseling.  Also, available online at:  http://www.[place].org    ORTHOPEDIC INJURY   Please know that significant injuries can exist even when an initial x-ray is read as normal or negative.  This can occur because some fractures (broken bones) are not initially visible on x-rays.  For this reason, close outpatient follow-up with your primary care doctor or bone specialist (orthopedist) is required.    MEDICATIONS AND FOLLOWUP  Please be aware that some prescription medications can cause drowsiness.  Use caution when driving or operating machinery.    The examination and treatment you have received in our Emergency Department is provided on an emergency basis, and is not intended to be a substitute for your primary care physician.  It is important that your doctor checks you again and that you report any new or remaining problems at that time.      ASSISTANCE WITH INSURANCE    Affordable Care Act  [facility])  Call to start or finish an application, compare plans, enroll or ask a question.  [phone]  TTY: [phone]  Web:  Healthcare.gov    Help Enrolling in [facility]  [employer] [place]  816-138-1600 (TOLL-FREE)  678-076-2070 (TTY)  Web:  Http://www.coverva.org    Local Help Enrolling in the [facility]  [place] [facility]  9510478841 (MAIN)  Email:  health-help@nvfs .org  Web:  http://lewis-perez.info/  Address:  [address] [place], [place] [address]    SEDATING MEDICATIONS  Sedating medications include strong pain medications (e.g. narcotics), muscle relaxers, benzodiazepines (used for anxiety and as muscle relaxers), Benadryl/diphenhydramine and other antihistamines for allergic reactions/itching, and other medications.  If you are unsure if you have received a sedating medication, please ask your physician or nurse.  If you  received a sedating medication: DO NOT drive a car. DO NOT operate machinery. DO NOT perform jobs where you need to be alert.  DO NOT drink alcoholic beverages while taking this medicine.     If you get dizzy, sit or lie down at the first signs. Be careful going up and down stairs.  Be extra careful to prevent falls.     Never give this medicine to others.     Keep this medicine out of reach of children.     Do not take or save old medicines. Throw them away when outdated.     Keep all medicines in a cool, dry place. DO NOT keep them in your bathroom medicine cabinet or in a cabinet above the stove.    MEDICATION REFILLS  Please be aware that we cannot refill any prescriptions through the ER. If you need further treatment from what is provided at your ER visit, please follow up with your primary care doctor or your pain management specialist.    [place]  Did you know [facility] has two freestanding ERs located just a few miles away?  [facility] [place] have short wait times, easy free parking directly in front of the building and top patient satisfaction scores - and the same Board Certified Emergency Medicine doctors as [facility].

## 2020-06-16 ENCOUNTER — Ambulatory Visit (INDEPENDENT_AMBULATORY_CARE_PROVIDER_SITE_OTHER): Payer: 59 | Admitting: Cardiovascular Disease

## 2020-06-18 ENCOUNTER — Emergency Department: Payer: 59

## 2020-06-18 ENCOUNTER — Emergency Department
Admission: EM | Admit: 2020-06-18 | Discharge: 2020-06-18 | Disposition: A | Discharge status: Home / Self Care | Payer: 59 | Attending: Emergency Medicine | Admitting: Emergency Medicine

## 2020-06-18 DIAGNOSIS — R0789 Other chest pain: Secondary | ICD-10-CM | POA: Insufficient documentation

## 2020-06-18 DIAGNOSIS — F17210 Nicotine dependence, cigarettes, uncomplicated: Secondary | ICD-10-CM | POA: Insufficient documentation

## 2020-06-18 DIAGNOSIS — R079 Chest pain, unspecified: Secondary | ICD-10-CM

## 2020-06-18 LAB — ECG 12-LEAD
Atrial Rate: 80 {beats}/min
Atrial Rate: 60 {beats}/min
P Axis: 49 degrees
P Axis: 53 degrees
P-R Interval: 176 ms
P-R Interval: 150 ms
Q-T Interval: 372 ms
Q-T Interval: 410 ms
QRS Duration: 94 ms
QRS Duration: 88 ms
QTC Calculation (Bezet): 429 ms
QTC Calculation (Bezet): 410 ms
R Axis: -7 degrees
R Axis: -14 degrees
T Axis: 7 degrees
T Axis: -20 degrees
Ventricular Rate: 80 {beats}/min
Ventricular Rate: 60 {beats}/min

## 2020-06-18 LAB — CBC AND DIFFERENTIAL
Absolute NRBC: 0 10*3/uL (ref 0.00–0.00)
Basophils Absolute Automated: 0.08 10*3/uL (ref 0.00–0.08)
Basophils Automated: 0.9 %
Eosinophils Absolute Automated: 0.38 10*3/uL (ref 0.00–0.44)
Eosinophils Automated: 4.5 %
Hematocrit: 37.5 % — ABNORMAL LOW (ref 37.6–49.6)
Hgb: 12.6 g/dL (ref 12.5–17.1)
Immature Granulocytes Absolute: 0.02 10*3/uL (ref 0.00–0.07)
Immature Granulocytes: 0.2 %
Lymphocytes Absolute Automated: 2.69 10*3/uL (ref 0.42–3.22)
Lymphocytes Automated: 31.6 %
MCH: 31.7 pg (ref 25.1–33.5)
MCHC: 33.6 g/dL (ref 31.5–35.8)
MCV: 94.2 fL (ref 78.0–96.0)
MPV: 10.8 fL (ref 8.9–12.5)
Monocytes Absolute Automated: 0.8 10*3/uL (ref 0.21–0.85)
Monocytes: 9.4 %
Neutrophils Absolute: 4.53 10*3/uL (ref 1.10–6.33)
Neutrophils: 53.4 %
Nucleated RBC: 0 /100 WBC (ref 0.0–0.0)
Platelets: 161 10*3/uL (ref 142–346)
RBC: 3.98 10*6/uL — ABNORMAL LOW (ref 4.20–5.90)
RDW: 13 % (ref 11–15)
WBC: 8.5 10*3/uL (ref 3.10–9.50)

## 2020-06-18 LAB — COMPREHENSIVE METABOLIC PANEL
ALT: 13 U/L (ref 0–55)
AST (SGOT): 20 U/L (ref 5–34)
Albumin/Globulin Ratio: 1.3 (ref 0.9–2.2)
Albumin: 3.9 g/dL (ref 3.5–5.0)
Alkaline Phosphatase: 67 U/L (ref 37–117)
Anion Gap: 6 (ref 5.0–15.0)
BUN: 14 mg/dL (ref 9.0–28.0)
Bilirubin, Total: 0.6 mg/dL (ref 0.2–1.2)
CO2: 28 mEq/L (ref 22–29)
Calcium: 9.1 mg/dL (ref 8.5–10.5)
Chloride: 108 mEq/L (ref 100–111)
Creatinine: 0.9 mg/dL (ref 0.7–1.3)
Globulin: 3 g/dL (ref 2.0–3.6)
Glucose: 101 mg/dL — ABNORMAL HIGH (ref 70–100)
Potassium: 4.2 mEq/L (ref 3.5–5.1)
Protein, Total: 6.9 g/dL (ref 6.0–8.3)
Sodium: 142 mEq/L (ref 136–145)

## 2020-06-18 LAB — TROPONIN I: Troponin I: <0.01 ng/mL (ref 0.00–0.05)

## 2020-06-18 LAB — GFR: EGFR: >60

## 2020-06-18 MED ORDER — ASPIRIN 81 MG PO CHEW
162.0000 mg | CHEWABLE_TABLET | Freq: Once | ORAL | Status: DC
Start: 2020-06-18 — End: 2020-06-18
  Filled 2020-06-18: qty 2

## 2020-06-18 NOTE — ED Provider Notes (Signed)
[name]MENT H&P      Visit date: 06/18/2020      CLINICAL SUMMARY          Diagnosis:    .     Final diagnoses:   Chest pain, unspecified type         MDM Notes:      [name] [name] is a 55 y.o. old male presenting with chest discomfort. Vitals stable. Nursing notes reviewed. Exam as listed. Based on unremarkable labs or tests, unremarkable EKG, unremarkable x-ray, and low heart score criteria, it is unlikely the patient is suffering from or needs admission for an acute coronary event at this time.  Unlikely PE, dissection, pneumothorax, pneumonia. Advised patient at length the necessity of close primary care and cardiology follow-up for continued evaluation and management symptoms. Instructed the patient to follow up with her primary care provider and cardiology within 2-3 days. Also discussed warning signs and instructed patient to return to the ED if she develops any new or worsening symptoms. Patient voiced understanding of instructions, questions were answered and the patient was discharged home. Patient remained stable during the rest of her ED course.           Disposition:         Discharge        Discharge Prescriptions     None                         CLINICAL INFORMATION        HPI:      Chief Complaint: Chest Pain, Nausea, and Emesis  .    [name] [name] is a 55 y.o. male with hisotry of gallstones, gastritis, heroin abuse (sober for 1 year)    who presents with chest pain. Reports 1pm starting to have chest discomfort described as pressure with intermittent sharp pain.  No associated exertional dyspnea, orthopnea, pleuritic pain, nausea, vomiting, diaphoresis, lower extremity edema, new numbness weakness, back pain.  Minimal tingling radiation to the left upper arm.  Provided drinking water thinking that this symptom was related to gastric reflux with no improvement and therefore he presents to the emergency department.  No history of cardiac disease in  the past.  Has not seen a cardiologist or had a stress test performed in the last year.  No immediate family history of cardiac disease.  No diagnosis of hypertension hyperlipidemia or diabetes.  Intermittent nicotine smoker but otherwise denies alcohol or drug abuse.    History obtained from: Patient      ROS:      Positive and negative ROS elements as per HPI.  All other systems reviewed and negative.      Physical Exam:      Pulse 94   BP 136/79   Resp 18   SpO2 99 %   Temp 98 F (36.7 C)    General appearance - alert, well appearing, and in no distress  Mental status - alert, oriented to person, place, and time  Eyes - pupils equal and reactive, extraocular eye movements intact  Chest - clear to auscultation, no wheezes, rales or rhonchi, symmetric air entry  Heart - normal rate, regular rhythm, normal S1, S2, no murmurs, rubs, clicks or gallops  Abdomen - soft, nontender, nondistended, no masses or organomegaly  Back exam - full range of motion, no tenderness, palpable spasm or pain on motion  Neurological - alert, oriented, normal speech, no focal findings  or movement disorder noted  Extremities - peripheral pulses normal, no pedal edema, no clubbing or cyanosis  Skin - no suspicious skin lesions noted            PAST HISTORY        Primary Care Provider: [name], MD        PMH/PSH:    .     Past Medical History:   Diagnosis Date    Echocardiogram 07/2015, 08/2011    Gallstones     Gastritis     Heart murmur     Heroin abuse     Syphilis        He has no past surgical history on file.      Social/Family History:      He reports that he has been smoking cigarettes. He has a 2.50 pack-year smoking history. He has never used smokeless tobacco. He reports previous alcohol use. He reports previous drug use. Drugs: Marijuana and Cocaine.    Family History   Problem Relation Age of Onset    Diabetes Mother     Hypertension Mother     Other Father         Unknown disease         Listed  Medications on Arrival:    .     Home Medications     Med List Status: In Progress Set By: Lianne Bushy, RN at 06/18/2020  1:45 AM        No Medications         Allergies: He is allergic to nsaids, gluten, percocet [oxycodone-acetaminophen], and tylenol [acetaminophen].            VISIT INFORMATION        Clinical Course in the ED:      Throughout the stay in the Emergency Department, questions and concerns surrounding pain management, care plan, diagnostic studies, effects of medications administered or prescribed, and future care plan were assessed and addressed.           Medications Given in the ED:    .     ED Medication Orders (From admission, onward)    Start Ordered     Status Ordering Provider    06/18/20 [phone] 06/18/20 [phone]    Once        Route: Oral  Ordered Dose: 162 mg     Discontinued Johari Pinney MOHAMMAD            Procedures:      Procedures      Interpretations:      EKG -             interpreted by me: normal sinus at 80.  No ST abnormalities, normal intervals and Compared to prior EKG no significant change     *This note was generated by the Epic / Dragon speech recognition system and may contain errors or omissions not intended by the user. Grammatical errors, random word insertions, deletions, pronoun errors and incomplete sentences are occasional consequences of this technology due to software limitations. Not all errors are caught or corrected. If there are questions or concerns about the content of this note or information contained within the body of this dictation they should be addressed directly with the author for clarification.*     *This note may contain test results that returned after care was transitioned to another provider or after discharge from the ED. These results are automatically entered in the chart by the electronic  medical record, and the expectation is that a subsequent provider will act on any notable results.*               RESULTS        Lab Results:      Results      Procedure Component Value Units Date/Time    Troponin I [[id number]] Collected: 06/18/20 0324    Specimen: Blood Updated: 06/18/20 0412     Troponin I <0.01 ng/mL     Comprehensive metabolic panel [[id number]]  (Abnormal) Collected: 06/18/20 0324    Specimen: Blood Updated: 06/18/20 0410     Glucose 101 mg/dL      BUN 78.2 mg/dL      Creatinine 0.9 mg/dL      Sodium 956 mEq/L      Potassium 4.2 mEq/L      Chloride 108 mEq/L      CO2 28 mEq/L      Calcium 9.1 mg/dL      Protein, Total 6.9 g/dL      Albumin 3.9 g/dL      AST (SGOT) 20 U/L      ALT 13 U/L      Alkaline Phosphatase 67 U/L      Bilirubin, Total 0.6 mg/dL      Globulin 3.0 g/dL      Albumin/Globulin Ratio 1.3     Anion Gap 6.0    GFR [[id number]] Collected: 06/18/20 0324     Updated: 06/18/20 0410     EGFR >60.0    CBC and differential [[id number]]  (Abnormal) Collected: 06/18/20 0324    Specimen: Blood Updated: 06/18/20 0346     WBC 8.50 x10 3/uL      Hgb 12.6 g/dL      Hematocrit 41.3 %      Platelets 161 x10 3/uL      RBC 3.98 x10 6/uL      MCV 94.2 fL      MCH 31.7 pg      MCHC 33.6 g/dL      RDW 13 %      MPV 10.8 fL      Neutrophils 53.4 %      Lymphocytes Automated 31.6 %      Monocytes 9.4 %      Eosinophils Automated 4.5 %      Basophils Automated 0.9 %      Immature Granulocytes 0.2 %      Nucleated RBC 0.0 /100 WBC      Neutrophils Absolute 4.53 x10 3/uL      Lymphocytes Absolute Automated 2.69 x10 3/uL      Monocytes Absolute Automated 0.80 x10 3/uL      Eosinophils Absolute Automated 0.38 x10 3/uL      Basophils Absolute Automated 0.08 x10 3/uL      Immature Granulocytes Absolute 0.02 x10 3/uL      Absolute NRBC 0.00 x10 3/uL               Radiology Results:      Chest AP Portable   Final Result      No acute abnormality.      Johnsie Kindred, MD    06/18/2020 2:44 AM                        Tarren Sabree, Mohammed Kindle, MD  06/18/20 [phone]

## 2020-06-18 NOTE — ED Triage Notes (Signed)
Patient presents to ED C/O sternal chest pain since 1pm yesterday, states it radiates down left arm. Pain comes and goes. Patient states hes been having heart problems for awhile, didn't specify.  AxOx4, speech clear.

## 2020-06-18 NOTE — Discharge Instructions (Signed)
Dear [name] [name]:    Thank you for choosing the [facility] Emergency Department, the premier emergency department in the [place] area.  I hope your visit today was EXCELLENT. You will receive a survey via text message that will give you the opportunity to provide feedback to your team about your visit. Please do not hesitate to reach out with any questions!    Specific instructions for your visit today:    You are being discharged from the Emergency Department with the recommendation to obtain early cardiology evaluation or testing. We have made arrangement for you to be called within 36 hours (or Monday AM if this occurs Friday through Sunday) by one of our cardiology offices.    If you do not receive a call within 36 hours, please call the following number and inform them that you have been referred by the [facility] Emergency Department:          [phone]     IF YOU DO NOT CONTINUE TO IMPROVE OR YOUR CONDITION WORSENS, PLEASE CONTACT YOUR DOCTOR OR RETURN IMMEDIATELY TO THE EMERGENCY DEPARTMENT.    Sincerely,  Ezequiel Kayser Mohammad,*MD  Attending Emergency Physician  [facility] Emergency Department      OBTAINING A PRIMARY CARE APPOINTMENT    Primary care physicians (PCPs, also known as primary care doctors) are either internists or family medicine doctors. Both types of PCPs focus on health promotion, disease prevention, patient education and counseling, and treatment of acute and chronic medical conditions.    If you need a primary care doctor, please call the below number and ask who is receiving new patients.     [facility]  Telephone:  [phone]  [email or web address]    DOCTOR REFERRALS  Call [phone] (available 24 hours a day, 7 days a week) if you need any further referrals and we can help you find a primary care doctor or specialist.  Also, available online at:  [email or web address]    YOUR CONTACT INFORMATION  Before leaving please check  with registration to make sure we have an up-to-date contact number.  You can call registration at [phone] to update your information.  For questions about your hospital bill, please call [phone].  For questions about your Emergency Dept Physician bill please call [phone].      FREE HEALTH SERVICES  If you need help with health or social services, please call 2-1-1 for a free referral to resources in your area.  2-1-1 is a free service connecting people with information on health insurance, free clinics, pregnancy, mental health, dental care, food assistance, housing, and substance abuse counseling.  Also, available online at:  http://www.[place].org    ORTHOPEDIC INJURY   Please know that significant injuries can exist even when an initial x-ray is read as normal or negative.  This can occur because some fractures (broken bones) are not initially visible on x-rays.  For this reason, close outpatient follow-up with your primary care doctor or bone specialist (orthopedist) is required.    MEDICATIONS AND FOLLOWUP  Please be aware that some prescription medications can cause drowsiness.  Use caution when driving or operating machinery.    The examination and treatment you have received in our Emergency Department is provided on an emergency basis, and is not intended to be a substitute for your primary care physician.  It is important that your doctor checks you again and that you report any new or remaining problems at that time.  ASSISTANCE WITH INSURANCE    Affordable Care Act  [facility])  Call to start or finish an application, compare plans, enroll or ask a question.  [place]: [phone]  Web:  Healthcare.gov    Help Enrolling in [place]  ([phone] [phone] (TOLL-FREE)  519-110-9101 (TTY)  Web:  Http://www.coverva.org    Local Help Enrolling in the [place]  (425)[phone] (MAIN)  Email:  health-help'@nvfs'$ .org  Web:   http://lewis-perez.info/  Address:  [address], [id number] [place],  [address]    SEDATING MEDICATIONS  Sedating medications include strong pain medications (e.g. narcotics), muscle relaxers, benzodiazepines (used for anxiety and as muscle relaxers), Benadryl/diphenhydramine and other antihistamines for allergic reactions/itching, and other medications.  If you are unsure if you have received a sedating medication, please ask your physician or nurse.  If you received a sedating medication: DO NOT drive a car. DO NOT operate machinery. DO NOT perform jobs where you need to be alert.  DO NOT drink alcoholic beverages while taking this medicine.     If you get dizzy, sit or lie down at the first signs. Be careful going up and down stairs.  Be extra careful to prevent falls.     Never give this medicine to others.     Keep this medicine out of reach of children.     Do not take or save old medicines. Throw them away when outdated.     Keep all medicines in a cool, dry place. DO NOT keep them in your bathroom medicine cabinet or in a cabinet above the stove.    MEDICATION REFILLS  Please be aware that we cannot refill any prescriptions through the ER. If you need further treatment from what is provided at your ER visit, please follow up with your primary care doctor or your pain management specialist.    [place]  Did you know [facility] has two freestanding ERs located just a few miles away?  [facility] [place] have short wait times, easy free parking directly in front of the building and top patient satisfaction scores - and the same Board Certified Emergency Medicine doctors as [facility].

## 2020-06-18 NOTE — ED Triage Notes (Signed)
Sternal, non radiating chest pain and n/v onset intermittently since this afternoon.

## 2020-07-13 ENCOUNTER — Observation Stay
Admission: EM | Admit: 2020-07-13 | Discharge: 2020-07-15 | Disposition: A | Discharge status: Home / Self Care | Payer: 59 | Attending: Hospitalist | Admitting: Hospitalist

## 2020-07-13 DIAGNOSIS — K2970 Gastritis, unspecified, without bleeding: Secondary | ICD-10-CM | POA: Insufficient documentation

## 2020-07-13 DIAGNOSIS — N200 Calculus of kidney: Secondary | ICD-10-CM | POA: Insufficient documentation

## 2020-07-13 DIAGNOSIS — E785 Hyperlipidemia, unspecified: Secondary | ICD-10-CM | POA: Insufficient documentation

## 2020-07-13 DIAGNOSIS — R079 Chest pain, unspecified: Principal | ICD-10-CM | POA: Insufficient documentation

## 2020-07-13 DIAGNOSIS — I2510 Atherosclerotic heart disease of native coronary artery without angina pectoris: Secondary | ICD-10-CM | POA: Insufficient documentation

## 2020-07-13 DIAGNOSIS — F17210 Nicotine dependence, cigarettes, uncomplicated: Secondary | ICD-10-CM | POA: Insufficient documentation

## 2020-07-13 DIAGNOSIS — I10 Essential (primary) hypertension: Secondary | ICD-10-CM | POA: Insufficient documentation

## 2020-07-13 DIAGNOSIS — Z8719 Personal history of other diseases of the digestive system: Secondary | ICD-10-CM | POA: Insufficient documentation

## 2020-07-13 LAB — COMPREHENSIVE METABOLIC PANEL
ALT: 13 U/L (ref 0–55)
AST (SGOT): 25 U/L (ref 5–34)
Albumin/Globulin Ratio: 1.3 (ref 0.9–2.2)
Albumin: 4.1 g/dL (ref 3.5–5.0)
Alkaline Phosphatase: 64 U/L (ref 37–117)
Anion Gap: 10 (ref 5.0–15.0)
BUN: 10 mg/dL (ref 9.0–28.0)
Bilirubin, Total: 1.2 mg/dL (ref 0.2–1.2)
CO2: 23 mEq/L (ref 22–29)
Calcium: 9.3 mg/dL (ref 8.5–10.5)
Chloride: 105 mEq/L (ref 100–111)
Creatinine: 0.7 mg/dL (ref 0.7–1.3)
Globulin: 3.2 g/dL (ref 2.0–3.6)
Glucose: 84 mg/dL (ref 70–100)
Potassium: 3.5 mEq/L (ref 3.5–5.1)
Protein, Total: 7.3 g/dL (ref 6.0–8.3)
Sodium: 138 mEq/L (ref 136–145)

## 2020-07-13 LAB — CBC AND DIFFERENTIAL
Absolute NRBC: 0 10*3/uL (ref 0.00–0.00)
Basophils Absolute Automated: 0.07 10*3/uL (ref 0.00–0.08)
Basophils Automated: 0.9 %
Eosinophils Absolute Automated: 0.22 10*3/uL (ref 0.00–0.44)
Eosinophils Automated: 2.8 %
Hematocrit: 35.3 % — ABNORMAL LOW (ref 37.6–49.6)
Hgb: 12.3 g/dL — ABNORMAL LOW (ref 12.5–17.1)
Immature Granulocytes Absolute: 0.03 10*3/uL (ref 0.00–0.07)
Immature Granulocytes: 0.4 %
Lymphocytes Absolute Automated: 2.75 10*3/uL (ref 0.42–3.22)
Lymphocytes Automated: 35.5 %
MCH: 31.8 pg (ref 25.1–33.5)
MCHC: 34.8 g/dL (ref 31.5–35.8)
MCV: 91.2 fL (ref 78.0–96.0)
MPV: 10.7 fL (ref 8.9–12.5)
Monocytes Absolute Automated: 0.63 10*3/uL (ref 0.21–0.85)
Monocytes: 8.1 %
Neutrophils Absolute: 4.05 10*3/uL (ref 1.10–6.33)
Neutrophils: 52.3 %
Nucleated RBC: 0 /100 WBC (ref 0.0–0.0)
Platelets: 158 10*3/uL (ref 142–346)
RBC: 3.87 10*6/uL — ABNORMAL LOW (ref 4.20–5.90)
RDW: 13 % (ref 11–15)
WBC: 7.75 10*3/uL (ref 3.10–9.50)

## 2020-07-13 LAB — GFR: EGFR: >60

## 2020-07-13 NOTE — EDIE (Signed)
COLLECTIVE?NOTIFICATION?07/13/2020 22:18?[name], HINGLE [name]?MRN: [id number]    Criteria Met      5 ED Visits in 12 Months    Security and Safety  No recent Security Events currently on file    ED Care Guidelines  There are currently no ED Care Guidelines for this patient. Please check your facility's medical records system.        Prescription Monitoring Program  000??- Narcotic Use Score  000??- Sedative Use Score  000??- Stimulant Use Score  000??- Overdose Risk Score  - All Scores range from 000-999 with 75% of the population scoring < 200 and on 1% scoring above 650  - The last digit of the narcotic, sedative, and stimulant score indicates the number of active prescriptions of that type  - Higher Use scores correlate with increased prescribers, pharmacies, mg equiv, and overlapping prescriptions  - Higher Overdose Risk Scores correlate with increased risk of unintentional overdose death   Concerning or unexpectedly high scores should prompt a review of the PMP record; this does not constitute checking PMP for prescribing purposes.      E.D. Visit Count (12 mo.)  Facility Visits   [place] [facility]: [facility] 1   [facility] 4   Total 5   Note: Visits indicate total known visits.     Recent Emergency Department Visit Summary  Date Facility [facility] Type Diagnoses or Chief Complaint   Jul 13, 2020 [place] [facility] [place] [place] [facility]      Triage      Jun 18, 2020 [place] [facility] [place] [facility]      Triage      Triage - CP      Emesis      Chest Pain      Nausea      Chest pain, unspecified      Jun 07, 2020 [place] [facility] [place] [facility]      Triage      Otalgia      Nasal congestion      Other chronic sinusitis      Jun 01, 2020 [place] [facility] [place] [facility]      Medic: CP #2      Chest Pain      Other chest pain      Unspecified disorder of tympanic membrane, left ear      Apr 14, 2020 [place] [facility] [place] [name]  [place] [facility]      Chest Pain and Leg Pain      Leg Pain      Chest Pain      Otitis media, unspecified, unspecified ear      Pain in left leg      Chest pain, unspecified          Recent Inpatient Visit Summary  No recorded inpatient visits.     Care Team  Provider Specialty Phone Fax Service Dates   [name] , MD Internal Medicine   Current      Collective Portal  This patient has registered at the [facility] Emergency Department   For more information visit: [email or web address]     PLEASE NOTE:     1.   Any care recommendations and other clinical information are provided as guidelines or for historical purposes only, and providers should exercise their own clinical judgment when providing care.    2.   You may only use this information  for purposes of treatment, payment or health care operations activities, and subject to the limitations of applicable [employer] Policies.    3.   You should consult directly with the organization that provided a care guideline or other clinical history with any questions about additional information or accuracy or completeness of information provided.    ? 2022 [employer] - [email or web address]

## 2020-07-14 ENCOUNTER — Emergency Department: Payer: 59

## 2020-07-14 ENCOUNTER — Observation Stay: Payer: 59

## 2020-07-14 ENCOUNTER — Observation Stay (HOSPITAL_BASED_OUTPATIENT_CLINIC_OR_DEPARTMENT_OTHER): Payer: 59

## 2020-07-14 DIAGNOSIS — R079 Chest pain, unspecified: Secondary | ICD-10-CM | POA: Diagnosis present

## 2020-07-14 DIAGNOSIS — I208 Other forms of angina pectoris: Secondary | ICD-10-CM

## 2020-07-14 DIAGNOSIS — I2510 Atherosclerotic heart disease of native coronary artery without angina pectoris: Secondary | ICD-10-CM

## 2020-07-14 LAB — RAPID DRUG SCREEN, URINE
Barbiturate Screen, UR: NEGATIVE
Benzodiazepine Screen, UR: NEGATIVE
Cannabinoid Screen, UR: NEGATIVE
Cocaine, UR: NEGATIVE
Opiate Screen, UR: POSITIVE — AB
PCP Screen, UR: NEGATIVE
Urine Amphetamine Screen: NEGATIVE

## 2020-07-14 LAB — ECG 12-LEAD
Atrial Rate: 94 {beats}/min
IHS MUSE NARRATIVE AND IMPRESSION: NORMAL
P Axis: 45 degrees
P-R Interval: 150 ms
Q-T Interval: 364 ms
QRS Duration: 88 ms
QTC Calculation (Bezet): 455 ms
R Axis: -47 degrees
T Axis: 26 degrees
Ventricular Rate: 94 {beats}/min

## 2020-07-14 LAB — TROPONIN I
Troponin I: <0.01 ng/mL (ref 0.00–0.05)
Troponin I: <0.01 ng/mL (ref 0.00–0.05)
Troponin I: <0.01 ng/mL (ref 0.00–0.05)
Troponin I: <0.01 ng/mL (ref 0.00–0.05)

## 2020-07-14 LAB — I-STAT TROPONIN: i-STAT Troponin: 0.01 ng/mL (ref 0.00–0.09)

## 2020-07-14 MED ORDER — MAGNESIUM SULFATE IN D5W 1-5 GM/100ML-% IV SOLN
1.0000 g | INTRAVENOUS | Status: DC | PRN
Start: 2020-07-14 — End: 2020-07-15

## 2020-07-14 MED ORDER — LORAZEPAM 0.5 MG PO TABS
0.2500 mg | ORAL_TABLET | Freq: Three times a day (TID) | ORAL | Status: DC | PRN
Start: 2020-07-14 — End: 2020-07-15

## 2020-07-14 MED ORDER — BENZOCAINE-MENTHOL MT LOZG (WRAP)
1.0000 | LOZENGE | OROMUCOSAL | Status: DC | PRN
Start: 2020-07-14 — End: 2020-07-15

## 2020-07-14 MED ORDER — ALUM & MAG HYDROXIDE-SIMETH 200-200-20 MG/5ML PO SUSP
30.0000 mL | Freq: Two times a day (BID) | ORAL | Status: DC | PRN
Start: 2020-07-14 — End: 2020-07-15

## 2020-07-14 MED ORDER — KETOROLAC TROMETHAMINE 30 MG/ML IJ SOLN
15.0000 mg | Freq: Once | INTRAMUSCULAR | Status: AC
Start: 2020-07-14 — End: 2020-07-14
  Administered 2020-07-14: 15 mg via INTRAVENOUS
  Filled 2020-07-14: qty 1

## 2020-07-14 MED ORDER — LIDOCAINE VISCOUS HCL 2 % MT SOLN
10.0000 mL | Freq: Once | OROMUCOSAL | Status: DC
Start: 2020-07-14 — End: 2020-07-15
  Filled 2020-07-14: qty 15

## 2020-07-14 MED ORDER — LACTATED RINGERS IV SOLN
INTRAVENOUS | Status: AC
Start: 2020-07-14 — End: 2020-07-14

## 2020-07-14 MED ORDER — IOHEXOL 350 MG/ML IV SOLN
80.0000 mL | Freq: Once | INTRAVENOUS | Status: AC | PRN
Start: 2020-07-14 — End: 2020-07-14
  Administered 2020-07-14: 80 mL via INTRAVENOUS

## 2020-07-14 MED ORDER — GLUCAGON 1 MG IJ SOLR (WRAP)
1.0000 mg | INTRAMUSCULAR | Status: DC | PRN
Start: 2020-07-14 — End: 2020-07-15

## 2020-07-14 MED ORDER — ALUM & MAG HYDROXIDE-SIMETH 200-200-20 MG/5ML PO SUSP
30.0000 mL | Freq: Once | ORAL | Status: DC
Start: 2020-07-14 — End: 2020-07-15
  Filled 2020-07-14: qty 30

## 2020-07-14 MED ORDER — DEXTROSE 10 % IV BOLUS
25.0000 g | INTRAVENOUS | Status: DC | PRN
Start: 2020-07-14 — End: 2020-07-15

## 2020-07-14 MED ORDER — MORPHINE SULFATE 15 MG PO TABS
15.0000 mg | ORAL_TABLET | Freq: Three times a day (TID) | ORAL | Status: DC | PRN
Start: 2020-07-14 — End: 2020-07-15
  Administered 2020-07-14: 15 mg via ORAL
  Filled 2020-07-14: qty 1

## 2020-07-14 MED ORDER — NALOXONE HCL 0.4 MG/ML IJ SOLN (WRAP)
0.2000 mg | INTRAMUSCULAR | Status: DC | PRN
Start: 2020-07-14 — End: 2020-07-15

## 2020-07-14 MED ORDER — LIDOCAINE VISCOUS HCL 2 % MT SOLN
10.0000 mL | Freq: Once | OROMUCOSAL | Status: AC
Start: 2020-07-14 — End: 2020-07-14
  Administered 2020-07-14: 10 mL via OROMUCOSAL
  Filled 2020-07-14: qty 15

## 2020-07-14 MED ORDER — POTASSIUM CHLORIDE CRYS ER 20 MEQ PO TBCR
0.0000 meq | EXTENDED_RELEASE_TABLET | ORAL | Status: DC | PRN
Start: 2020-07-14 — End: 2020-07-15

## 2020-07-14 MED ORDER — ALUM & MAG HYDROXIDE-SIMETH 200-200-20 MG/5ML PO SUSP
30.0000 mL | Freq: Once | ORAL | Status: AC
Start: 2020-07-14 — End: 2020-07-14
  Administered 2020-07-14: 30 mL via ORAL
  Filled 2020-07-14: qty 30

## 2020-07-14 MED ORDER — MAGNESIUM HYDROXIDE 400 MG/5ML PO SUSP
30.0000 mL | Freq: Once | ORAL | Status: DC | PRN
Start: 2020-07-14 — End: 2020-07-15

## 2020-07-14 MED ORDER — POLYETHYLENE GLYCOL 3350 17 G PO PACK
17.0000 g | PACK | Freq: Every day | ORAL | Status: DC
Start: 2020-07-14 — End: 2020-07-15
  Administered 2020-07-15: 17 g via ORAL
  Filled 2020-07-14: qty 1

## 2020-07-14 MED ORDER — ATORVASTATIN CALCIUM 40 MG PO TABS
40.0000 mg | ORAL_TABLET | Freq: Every evening | ORAL | Status: DC
Start: 2020-07-14 — End: 2020-07-15
  Filled 2020-07-14: qty 1

## 2020-07-14 MED ORDER — MORPHINE SULFATE 2 MG/ML IJ/IV SOLN (WRAP)
1.0000 mg | Freq: Four times a day (QID) | Status: DC | PRN
Start: 2020-07-14 — End: 2020-07-14
  Administered 2020-07-14: 1 mg via INTRAVENOUS
  Filled 2020-07-14: qty 1

## 2020-07-14 MED ORDER — LACTATED RINGERS IV SOLN
INTRAVENOUS | Status: DC
Start: 2020-07-14 — End: 2020-07-14

## 2020-07-14 MED ORDER — ASPIRIN 81 MG PO TBEC
81.0000 mg | DELAYED_RELEASE_TABLET | Freq: Every evening | ORAL | Status: DC
Start: 2020-07-14 — End: 2020-07-15
  Filled 2020-07-14: qty 1

## 2020-07-14 MED ORDER — SIMETHICONE 80 MG PO CHEW
80.0000 mg | CHEWABLE_TABLET | Freq: Four times a day (QID) | ORAL | Status: DC | PRN
Start: 2020-07-14 — End: 2020-07-15

## 2020-07-14 MED ORDER — BENZONATATE 100 MG PO CAPS
100.0000 mg | ORAL_CAPSULE | Freq: Three times a day (TID) | ORAL | Status: DC | PRN
Start: 2020-07-14 — End: 2020-07-15

## 2020-07-14 MED ORDER — ONDANSETRON HCL 4 MG/2ML IJ SOLN
4.0000 mg | Freq: Three times a day (TID) | INTRAMUSCULAR | Status: DC | PRN
Start: 2020-07-14 — End: 2020-07-15

## 2020-07-14 MED ORDER — ASPIRIN 81 MG PO TBEC
81.0000 mg | DELAYED_RELEASE_TABLET | Freq: Every evening | ORAL | Status: DC
Start: 2020-07-14 — End: 2020-07-14

## 2020-07-14 MED ORDER — DEXTROSE 5% IV BOLUS
250.0000 mL | INTRAVENOUS | Status: DC | PRN
Start: 2020-07-14 — End: 2020-07-15

## 2020-07-14 MED ORDER — SALINE SPRAY 0.65 % NA SOLN
2.0000 | NASAL | Status: DC | PRN
Start: 2020-07-14 — End: 2020-07-15

## 2020-07-14 MED ORDER — IOVERSOL 74 % IJ SOLN
70.0000 mL | Freq: Once | INTRAMUSCULAR | Status: AC | PRN
Start: 2020-07-14 — End: 2020-07-14
  Administered 2020-07-14: 70 mL via INTRAVENOUS

## 2020-07-14 MED ORDER — ONDANSETRON HCL 8 MG PO TABS
4.0000 mg | ORAL_TABLET | Freq: Three times a day (TID) | ORAL | Status: DC | PRN
Start: 2020-07-14 — End: 2020-07-15

## 2020-07-14 MED ORDER — SENNOSIDES-DOCUSATE SODIUM 8.6-50 MG PO TABS
1.0000 | ORAL_TABLET | Freq: Every evening | ORAL | Status: DC | PRN
Start: 2020-07-14 — End: 2020-07-15

## 2020-07-14 MED ORDER — LIDOCAINE VISCOUS HCL 2 % MT SOLN
10.0000 mL | Freq: Two times a day (BID) | OROMUCOSAL | Status: DC | PRN
Start: 2020-07-14 — End: 2020-07-15

## 2020-07-14 MED ORDER — DEXTROSE 50 % IV SOLN
25.0000 g | INTRAVENOUS | Status: DC | PRN
Start: 2020-07-14 — End: 2020-07-15

## 2020-07-14 MED ORDER — POTASSIUM & SODIUM PHOSPHATES 280-160-250 MG PO PACK
2.0000 | PACK | ORAL | Status: DC | PRN
Start: 2020-07-14 — End: 2020-07-15

## 2020-07-14 MED ORDER — MORPHINE SULFATE 2 MG/ML IJ/IV SOLN (WRAP)
2.0000 mg | Freq: Once | Status: AC
Start: 2020-07-14 — End: 2020-07-14
  Administered 2020-07-14: 2 mg via INTRAVENOUS
  Filled 2020-07-14: qty 1

## 2020-07-14 MED ORDER — CARBOXYMETHYLCELLULOSE SODIUM 0.5 % OP SOLN
1.0000 [drp] | Freq: Three times a day (TID) | OPHTHALMIC | Status: DC | PRN
Start: 2020-07-14 — End: 2020-07-15

## 2020-07-14 MED ORDER — FAMOTIDINE 20 MG PO TABS
20.0000 mg | ORAL_TABLET | Freq: Two times a day (BID) | ORAL | Status: DC
Start: 2020-07-14 — End: 2020-07-15
  Administered 2020-07-14 – 2020-07-15 (×2): 20 mg via ORAL
  Filled 2020-07-14 (×3): qty 1

## 2020-07-14 MED ORDER — NITROGLYCERIN 0.4 MG SL SUBL
0.4000 mg | SUBLINGUAL_TABLET | Freq: Once | SUBLINGUAL | Status: AC | PRN
Start: 2020-07-14 — End: 2020-07-14
  Administered 2020-07-14: 0.4 mg via SUBLINGUAL

## 2020-07-14 MED ORDER — FAMOTIDINE 10 MG/ML IV SOLN (WRAP)
20.0000 mg | Freq: Two times a day (BID) | INTRAVENOUS | Status: DC
Start: 2020-07-14 — End: 2020-07-15

## 2020-07-14 MED ORDER — IBUPROFEN 400 MG PO TABS
600.0000 mg | ORAL_TABLET | Freq: Four times a day (QID) | ORAL | Status: DC | PRN
Start: 2020-07-14 — End: 2020-07-15
  Filled 2020-07-14: qty 1

## 2020-07-14 MED ORDER — ENOXAPARIN SODIUM 40 MG/0.4ML IJ SOSY
40.0000 mg | PREFILLED_SYRINGE | Freq: Every day | INTRAMUSCULAR | Status: DC
Start: 2020-07-14 — End: 2020-07-15
  Administered 2020-07-15: 40 mg via SUBCUTANEOUS
  Filled 2020-07-14 (×2): qty 0.4

## 2020-07-14 MED ORDER — NITROGLYCERIN 0.4 MG SL SUBL
0.4000 mg | SUBLINGUAL_TABLET | SUBLINGUAL | Status: DC | PRN
Start: 2020-07-14 — End: 2020-07-15
  Filled 2020-07-14 (×2): qty 25

## 2020-07-14 MED ORDER — PSYLLIUM 3.4 G PO PACK (WRAP)
1.0000 | PACK | Freq: Every day | ORAL | Status: DC
Start: 2020-07-14 — End: 2020-07-15
  Administered 2020-07-15: 1 via ORAL
  Filled 2020-07-14 (×3): qty 1

## 2020-07-14 MED ORDER — FAMOTIDINE 20 MG PO TABS
20.0000 mg | ORAL_TABLET | Freq: Once | ORAL | Status: AC
Start: 2020-07-14 — End: 2020-07-14
  Administered 2020-07-14: 20 mg via ORAL
  Filled 2020-07-14: qty 1

## 2020-07-14 MED ORDER — MELATONIN 3 MG PO TABS
3.0000 mg | ORAL_TABLET | Freq: Every evening | ORAL | Status: DC | PRN
Start: 2020-07-14 — End: 2020-07-15

## 2020-07-14 MED ORDER — NITROGLYCERIN 0.4 MG SL SUBL
0.4000 mg | SUBLINGUAL_TABLET | SUBLINGUAL | Status: DC | PRN
Start: 2020-07-14 — End: 2020-07-14
  Filled 2020-07-14: qty 25

## 2020-07-14 MED ORDER — POTASSIUM CHLORIDE 20 MEQ PO PACK
0.0000 meq | PACK | ORAL | Status: DC | PRN
Start: 2020-07-14 — End: 2020-07-15

## 2020-07-14 NOTE — ED Triage Notes (Signed)
Pt reports having chest pain in the center of his chest, a headache that he describes as throbbing, and abdominal pain. Pt states he had nausea earlier but not now. Pt states his chest is tender to the touch. Pt reports all this happened around 2100. Pt states it has happened before but not as bad.

## 2020-07-14 NOTE — ED Notes (Signed)
[facility] EMERGENCY DEPT  ED NURSING NOTE FOR THE RECEIVING INPATIENT NURSE   ED NURSE Buelah Manis [phone]   ED CHARGE RN Corrie Dandy   ADMISSION INFORMATION   [name] [name] is a 55 y.o. male admitted with an ED diagnosis of:    1. Chest pain         Isolation: None   Allergies: Nsaids, Gluten, Percocet [oxycodone-acetaminophen], and Tylenol [acetaminophen]   Holding Orders confirmed? No   Belongings Documented? Yes   Home medications sent to pharmacy confirmed? N/A   NURSING CARE   Patient Comes From:   Mental Status: Home Independent  alert and oriented   ADL: Independent with all ADLs   Ambulation: no difficulty   Pertinent Information  and Safety Concerns: Pt reports having chest pain in the center of his chest, a headache that he describes as throbbing, and abdominal pain. Pt states he had nausea earlier but not now. Pt states his chest is tender to the touch. Pt reports all this happened around 2100. Pt states it has happened before but not as bad.     CT / NIH   CT Head ordered on this patient?  No   NIH/Dysphagia assessment done prior to admission? N/A   VITAL SIGNS (at the time of this note)      Vitals:    07/14/20 0212   BP: 154/86   Pulse: 69   Resp: 18   Temp:    SpO2: 100%

## 2020-07-14 NOTE — Plan of Care (Signed)
Pt admitted from ED  C/o ABD pain 7/10, covered initially with morphine. Pt refusing NSAIDs and tylenol  Skin intact, scattered tattoos  Low fat diet, tolerating  Ambulates independently in room  Belongings at bedside  CT scan today of coronary, negative  Kidney stone noted though, unobstructed  Plan for d/c with ibuprofen    Problem: Safety  Goal: Patient will be free from injury during hospitalization  Outcome: Progressing  Flowsheets (Taken 07/14/2020 0753)  Patient will be free from injury during hospitalization:   Assess patient's risk for falls and implement fall prevention plan of care per policy   Provide and maintain safe environment   Use appropriate transfer methods   Ensure appropriate safety devices are available at the bedside   Include patient/ family/ care giver in decisions related to safety   [place] rounding   Assess for patients risk for elopement and implement Elopement Risk Plan per policy  Goal: Patient will be free from infection during hospitalization  Outcome: Progressing  Flowsheets (Taken 07/14/2020 0753)  Free from Infection during hospitalization:   Assess and monitor for signs and symptoms of infection   Monitor lab/diagnostic results     Problem: Pain  Goal: Pain at adequate level as identified by patient  Outcome: Progressing  Flowsheets (Taken 07/14/2020 0753)  Pain at adequate level as identified by patient:   Identify patient comfort function goal   Evaluate if patient comfort function goal is met   Offer non-pharmacological pain management interventions     Problem: Side Effects from Pain Analgesia  Goal: Patient will experience minimal side effects of analgesic therapy  Outcome: Progressing  Flowsheets (Taken 07/14/2020 0753)  Patient will experience minimal side effects of analgesic therapy: Monitor/assess patient's respiratory status (RR depth, effort, breath sounds)     Problem: Discharge Barriers  Goal: Patient will be discharged home or other facility with  appropriate resources  Outcome: Progressing  Flowsheets (Taken 07/14/2020 0753)  Discharge to home or other facility with appropriate resources:   Provide appropriate patient education   Provide information on available health resources     Problem: Psychosocial and Spiritual Needs  Goal: Demonstrates ability to cope with hospitalization/illness  Outcome: Progressing  Flowsheets (Taken 07/14/2020 0753)  Demonstrates ability to cope with hospitalizations/illness:   Assist patient to identify own strengths and abilities   Provide quiet environment   Encourage verbalization of feelings/concerns/expectations     Problem: Hemodynamic Status: Cardiac  Goal: Stable vital signs and fluid balance  Outcome: Progressing  Flowsheets (Taken 07/14/2020 0753)  Stable vital signs and fluid balance:   Monitor/assess vital signs and telemetry per unit protocol   Weigh on admission and record weight daily     Problem: Inadequate Tissue Perfusion  Goal: Adequate tissue perfusion will be maintained  Outcome: Progressing  Flowsheets (Taken 07/14/2020 0753)  Adequate tissue perfusion will be maintained:   Monitor/assess vital signs   Encourage/assist patient as needed to turn, cough, and perform deep breathing every 2 hours

## 2020-07-14 NOTE — ED Provider Notes (Shared)
[name] EMERGENCY DEPARTMENT H&P                                             ATTENDING SUPERVISORY NOTE      Visit date: 07/13/2020    CLINICAL SUMMARY        Diagnosis:    .     Final diagnoses:   None         Attending Note:      ***         Disposition:      {~~Select at end of visit~~:45609}            VISIT INFORMATION        Clinical Course in the ED:                   Medications Given in the ED:    .     ED Medication Orders (From admission, onward)    None            Procedures:      I was present during key portions of any procedures performed.      Interpretations:      O2 sat-           saturation: 98 %; Oxygen use: room air; Interpretation: Normal    Radiology -     interpreted by me with the following observations: CXR - ***  EKG -             interpreted by me: ***  Monitor -         interpreted by me: normal sinus in 90s.               PAST HISTORY        Primary Care Provider: [name], MD        PMH/PSH:    .     Past Medical History:   Diagnosis Date   . Echocardiogram 07/2015, 08/2011   . Gallstones    . Gastritis    . Heart murmur    . Heroin abuse    . Syphilis        He has no past surgical history on file.      Social/Family History:      He reports that he has been smoking cigarettes. He has a 2.50 pack-year smoking history. He has never used smokeless tobacco. He reports previous alcohol use. He reports previous drug use. Drugs: Marijuana and Cocaine.    Family History   Problem Relation Age of Onset   . Diabetes Mother    . Hypertension Mother    . Other Father         Unknown disease         Listed Medications on Arrival:    .     Home Medications     Med List Status: In Progress Set By: Alease Medina, RN at 07/14/2020 12:05 AM        None on File         Allergies: He is allergic to nsaids, gluten, percocet [oxycodone-acetaminophen], and tylenol [acetaminophen].            RESULTS        Lab Results:      Results     Procedure Component  Value Units Date/Time    GFR [[id number]] Collected:  07/13/20 2253     Updated: 07/13/20 2336     EGFR >60.0    Comprehensive metabolic panel [[id number]] Collected: 07/13/20 2253    Specimen: Blood Updated: 07/13/20 2336     Glucose 84 mg/dL      BUN 62.1 mg/dL      Creatinine 0.7 mg/dL      Sodium 308 mEq/L      Potassium 3.5 mEq/L      Chloride 105 mEq/L      CO2 23 mEq/L      Calcium 9.3 mg/dL      Protein, Total 7.3 g/dL      Albumin 4.1 g/dL      AST (SGOT) 25 U/L      ALT 13 U/L      Alkaline Phosphatase 64 U/L      Bilirubin, Total 1.2 mg/dL      Globulin 3.2 g/dL      Albumin/Globulin Ratio 1.3     Anion Gap 10.0    CBC and differential [[id number]]  (Abnormal) Collected: 07/13/20 2253    Specimen: Blood Updated: 07/13/20 2313     WBC 7.75 x10 3/uL      Hgb 12.3 g/dL      Hematocrit 95.2 %      Platelets 158 x10 3/uL      RBC 3.87 x10 6/uL      MCV 91.2 fL      MCH 31.8 pg      MCHC 34.8 g/dL      RDW 13 %      MPV 10.7 fL      Neutrophils 52.3 %      Lymphocytes Automated 35.5 %      Monocytes 8.1 %      Eosinophils Automated 2.8 %      Basophils Automated 0.9 %      Immature Granulocytes 0.4 %      Nucleated RBC 0.0 /100 WBC      Neutrophils Absolute 4.05 x10 3/uL      Lymphocytes Absolute Automated 2.75 x10 3/uL      Monocytes Absolute Automated 0.63 x10 3/uL      Eosinophils Absolute Automated 0.22 x10 3/uL      Basophils Absolute Automated 0.07 x10 3/uL      Immature Granulocytes Absolute 0.03 x10 3/uL      Absolute NRBC 0.00 x10 3/uL               Radiology Results:      Chest AP Portable    (Results Pending)               Supervisory Statements:        I have reviewed and agree with the history except as noted above. The pertinent physical exam has been documented.  I have reviewed and agree with the final ED diagnosis.      Scribe Attestation:      I was acting as a Neurosurgeon for [employer], MD on [facility]  Treatment Team: Scribe: Larwance Rote     I am the first provider for this patient  and I personally performed the services documented. Treatment Team: Scribe: Larwance Rote is scribing for me on [facility]. This note and the patient instructions accurately reflect work and decisions made by me.  Izetta Dakin, MD

## 2020-07-14 NOTE — Progress Notes (Signed)
Patient received from ED at 0615.   AOx4, calm and cooperative. Name and DOB verified. Pt ambulated to bed and settled to room. White board updated. Plan of care reviewed, pt verbalized understanding. C/o moderate to severe chest and abdominal pain at this time. VSS. Telemetry placed, pt currently sinus brady. Declined gown change at this time.  Pt oriented to room and call bell system, fall precautions in place.     Cardiology consult  Pain mgmt  Tele  NPO (except sips w/ meds)

## 2020-07-14 NOTE — Consults (Signed)
[facility] CARDIOLOGY CONSULTATION REPORT  [facility]    Date Time: 07/14/20 8:55 AM  Patient Name: [name] [name],[name] [name]  Requesting Physician: Kristie Cowman, MD       Reason for Consultation:   Chest pain     History:   [name] [name] is a 55 y.o. male admitted on 07/13/2020.  We have been asked by Kristie Cowman, MD,  to provide cardiac consultation, regarding chest pain.     Primary cardiologist - Dr Rogelio Seen     He has a history of tobacco abuse.    Patient reports that he was working yesterday evening when he had sudden onset of headache. He then had chest pain that he describes as sharp and associated abdominal pain and nausea. He reports that the pain has been coming and going in waves since last night. He also has had mild association of shortness of breath.     He also reports that he ate a lot of fried foods this week. He does not typically eat a lot of fried foods and wonders if this has contributed to his abdominal pain.     He continues to smoke and reports that it takes about 1 week to finish one pack of cigarettes.     Otherwise, he has had no recent fevers, chills, vomiting, orthopnea, PND, lower extremity swelling.      Of note, he has been evaluated for chest pain in the past. He had an exercise treadmill that was normal.     Past Medical History:     Past Medical History:   Diagnosis Date    Echocardiogram 07/2015, 08/2011    Gallstones     Gastritis     Heart murmur     Heroin abuse     Syphilis        Past Surgical History:   No past surgical history on file.    Family History:     Family History   Problem Relation Age of Onset    Diabetes Mother     Hypertension Mother     Other Father         Unknown disease       Social History:     Social History     Socioeconomic History    Marital status: Single     Spouse name: Not on file    Number of children: Not on file    Years of education: Not on file    Highest education level: Not on file   Occupational  History    Not on file   Tobacco Use    Smoking status: Light Tobacco Smoker     Packs/day: 0.25     Years: 10.00     Pack years: 2.50     Types: Cigarettes    Smokeless tobacco: Never Used   Haematologist Use: Never used   Substance and Sexual Activity    Alcohol use: Not Currently    Drug use: Not Currently     Types: Marijuana, Cocaine    Sexual activity: Not Currently     Partners: Female   Other Topics Concern    Not on file   Social History Narrative    Not on file     Social Determinants of Health     Financial Resource Strain: Not on file   Food Insecurity: Not on file   Transportation Needs: Not on file   Physical Activity: Not  on file   Stress: Not on file   Social Connections: Not on file   Intimate Partner Violence: Not on file   Housing Stability: Not on file       Allergies:     Allergies   Allergen Reactions    Nsaids Rash    Gluten      Pt states "white bread"    Percocet [Oxycodone-Acetaminophen]     Tylenol [Acetaminophen] Hives     Tyelnol#3, hives and throw up       Medications:     No medications prior to admission.        Current Facility-Administered Medications   Medication Dose Route Frequency    aspirin EC  81 mg Oral QHS    atorvastatin  40 mg Oral QHS    enoxaparin  40 mg Subcutaneous Daily    famotidine  20 mg Oral Q12H SCH    Or    famotidine  20 mg Intravenous Q12H SCH         Review of Systems:    Comprehensive review of systems including constitutional, eyes, ears, nose, mouth, throat, cardiovascular, GI, GU, musculoskeletal, integumentary, respiratory, neurologic, psychiatric, and endocrine is negative other than what is mentioned already in the history of present illness    Physical Exam:     VITAL SIGNS PHYSICAL EXAM   Vitals:    07/14/20 0603 07/14/20 0742 07/14/20 1047 07/14/20 1120   BP: 135/83 145/74  116/69   Pulse: (!) 56 (!) 47 (!) 55 62   Resp: 16 17     Temp: 97.5 F (36.4 C) 97.5 F (36.4 C)     TempSrc: Oral Oral     SpO2: 100% 99%  99%    Weight:       Height:           Intake and Output Summary (Last 24 hours) at Date Time  No intake or output data in the 24 hours ending 07/14/20 0855    Telemetry: no arrhythmias   Physical Exam  General: awake, alert, breathing comfortably, no acute distress  Head: normocephalic  Eyes: EOM's intact  Cardiovascular: regular rate and rhythm, normal S1, S2, no S3, no S4, no murmurs, rubs or gallops  Neck: no carotid bruits or JVD  Lungs: clear to auscultation bilateraly, without wheezing, rhonchi, or rales  Abdomen: soft, non-tender, non-distended; no palpable masses,  normoactive bowel sounds  Extremities: no edema  Pulse: equal pulses, 4/4 symmetric  Neurological: Alert and oriented X3, mood and affect normal  Musculoskeletal: normal strength and tone     Labs Reviewed:     Recent Labs   Lab 07/14/20  0552 07/14/20  0257 07/13/20  2254   Troponin I <0.01 <0.01 <0.01             Recent Labs   Lab 07/13/20  2253   Bilirubin, Total 1.2   Protein, Total 7.3   Albumin 4.1   ALT 13   AST (SGOT) 25             Recent Labs   Lab 07/13/20  2253   WBC 7.75   Hgb 12.3*   Hematocrit 35.3*   Platelets 158     Recent Labs   Lab 07/13/20  2253   Sodium 138   Potassium 3.5   Chloride 105   CO2 23   BUN 10.0   Creatinine 0.7   EGFR >60.0   Glucose 84   Calcium 9.3  DIAGNOSTIC    07/13/2020 EKG: normal sinus rhythm, LAFB, no baseline ST-T changes     07/14/2020 CXR:   IMPRESSION:   1. Low-volume film with dependent atelectasis    Assessment:    Chest pain - seems atypical in nature, as he describes the pain to come in waves with associated abdominal pain. He is low risk for ischemic heart disease. Labs and EKG do not suggest ACS    Prior normal treadmill stress test in 2020   Tobacco abuse - reports that he is trying to cut down     Recommendations:    Coronary CTA for direct anatomical assessment   Further recommendations pending results - pt may be discharged if normal or nonobstructive CAD   Thank you for including [place]  in the care of this patient, we will follow along             Signed by: Milas Gain, MD, Cardiology Fellow    [facility]  APP Spectralink [phone] (8am-5pm)  MD Spectralink [phone] or [phone] (8am-5pm)  Arrhythmia Spectralink [phone] (8am-4:30pm)  Arrhythmia urgent consults or to reach the on-call EP MD [phone]  After hours, non urgent consult line [phone]  After Hours, urgent consults or to reach the on-call MD [phone]    I saw and examined the patient.  My exam and findings duplicated as documented in the note above on [name].  I have personally edited and added to the text of this note to reflect my exam, impression, and recommendations.  I have personally reviewed the patient's history and 24 hour interval events, along with vitals, labs, radiology images, and additional findings found in detail within the note above.  I concur with the above documented care plan which was developed with and reviewed by me.  See above impressions/recommendations which I have edited to reflect my thoughts.  GEN: NAD, appears stated age  HEENT: No scleral icterus or conjunctival pallor, moist mucous membranes   NECK: No JVD, normal carotid upstrokes without bruits   CV: Normal rate, regular rhythm, normal S1 and S2, and no murmurs, rubs, or gallops   CHEST: Clear to auscultation bilaterally with normal respiratory effort, no wheezes or rales  ABD: Soft, non-tender, non-distended, normal bowel sounds  EXT: No edema, 2+ DP/radial pulses bilaterally  SKIN: No rash or jaundice   NEURO: Alert and oriented to time, place and person, normal mood and affect    MSK: Normal muscle strength and tone bilaterally/symmetrically    Arcola Jansky, MD, [facility]  [facility]

## 2020-07-14 NOTE — H&P (Signed)
ADMISSION HISTORY AND PHYSICAL EXAM    Date Time: 07/14/20 2:58 AM  Patient Name: ZOXWRU,EAVW R  Attending Physician: Carloyn Manner, MD PhD    Assessment:   CP r/o ACS  Hx of chest pain syndrome  HTN essential  HLD  Ex-smoker  DVT remote not on ac  Ex-heroin abuse  Varicose veins  Gastritis    Plan:   Admitting to obs for CP With risk factors.  Pain is atypical and so far no evidence of cardiac etiology.  However pain has been persistent.  Asa statin nitro morphine.  Cards consutl.  Sent chat message to [facility].  NPO  GI meds  Drug screen.  Lovenox for ppx.  Full code  35 mins spent with the pt.  Home medications reviewed.  Med rec complete; see navigator.    History of Present Illness:   55 y.o. male with history of chest pain syndrome, followed by [facility], normal stress test aug 2021, also hypertension, hyperlipidemia, and gastritis presents to the ED with a chief complaint of sudden onset left-sided chest pain radiating down to left upper quadrant of the abdomen, as well as pain radiating to left neck and numbness over the left arm.  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  Patient describes the pain as sharp and pressure, associated with numbness over the left arm, but no diaphoresis or radiation to the back.  Denies ever having any cardiac problems in the past.  No recent trauma or travel.  Denies any drug use currently.  On ED arrival, patient's vitals within normal limits, and initial EKG on triage demonstrates some left fascicular block but no significant ST elevation or T wave changes.  CTA neg for PE/dissection.    Past Medical History:     Past Medical History:   Diagnosis Date    Echocardiogram 07/2015, 08/2011    Gallstones     Gastritis     Heart murmur     Heroin abuse     Syphilis        Past Surgical History:   No past surgical history on file.    Family History:     Family History   Problem Relation Age of  Onset    Diabetes Mother     Hypertension Mother     Other Father         Unknown disease       Social History:     Social History     Socioeconomic History    Marital status: Single     Spouse name: Not on file    Number of children: Not on file    Years of education: Not on file    Highest education level: Not on file   Occupational History    Not on file   Tobacco Use    Smoking status: Light Tobacco Smoker     Packs/day: 0.25     Years: 10.00     Pack years: 2.50     Types: Cigarettes    Smokeless tobacco: Never Used   Vaping Use    Vaping Use: Never used   Substance and Sexual Activity    Alcohol use: Not Currently    Drug use: Not Currently     Types: Marijuana, Cocaine    Sexual activity: Not Currently     Partners: Female   Other Topics Concern  Not on file   Social History Narrative    Not on file     Social Determinants of Health     Financial Resource Strain: Not on file   Food Insecurity: Not on file   Transportation Needs: Not on file   Physical Activity: Not on file   Stress: Not on file   Social Connections: Not on file   Intimate Partner Violence: Not on file   Housing Stability: Not on file       Allergies:     Allergies   Allergen Reactions    Nsaids Rash    Gluten      Pt states "white bread"    Percocet [Oxycodone-Acetaminophen]     Tylenol [Acetaminophen] Hives     Tyelnol#3, hives and throw up       Medications:   (Not in a hospital admission)    Home Medications     Med List Status: In Progress Set By: Alease Medina, RN at 07/14/2020 12:05 AM        No Medications          Review of Systems:   A comprehensive review of systems was:   ROS:  CONSTITUTIONAL: Denies weight loss, fever and chills.  HEENT: Denies changes in vision and hearing.  RESPIRATORY: Denies SOB and cough.  CV: see HPI  GI: Denies abdominal pain, nausea, vomiting and diarrhea, or hematochezia.  GU: Denies dysuria and urinary frequency, or hematuria.  MSK: Denies myalgia and joint pain.  Denies rash and  pruritus, or skin breaks.  NEUROLOGICAL: Denies headache and syncope.  PSYCHIATRIC: Denies recent changes in mood. Denies anxiety and depression.    Physical Exam:     Vitals:    07/14/20 0212   BP: 154/86   Pulse: 69   Resp: 18   Temp:    SpO2: 100%       Intake and Output Summary (Last 24 hours) at Date Time  No intake or output data in the 24 hours ending 07/14/20 0258  Exam:  GENERAL: AAOx 3. No acute distress. Well-nourished.  EYES: EOMI. Anicteric.  HENT: Moist mucous membranes. No scleral icterus. No lymphadenopathy.  LUNGS: CTA B/L. No accessory muscle use.  CARDIOVASCULAR: Regular rate and rhythm. No murmur. No JVD.  ABDOMEN: Soft, non-tender and non-distended. No palpable masses.  EXTREMITIES: No edema. Non-tender.  SKIN: No rashes or lesions. Warm.  NEUROLOGIC: No focal neurological deficits.    PSYCHIATRIC: Cooperative. Appropriate mood and affect.     Labs:     Results     Procedure Component Value Units Date/Time    Troponin I [[id number]] Collected: 07/14/20 0257    Specimen: Blood Updated: 07/14/20 0257    Troponin I [[id number]] Collected: 07/13/20 2254    Specimen: Blood Updated: 07/14/20 0206     Troponin I <0.01 ng/mL     i-Stat Troponin [[id number]] Collected: 07/14/20 0044     Updated: 07/14/20 0058     i-STAT Troponin 0.01 ng/mL     GFR [[id number]] Collected: 07/13/20 2253     Updated: 07/13/20 2336     EGFR >60.0    Comprehensive metabolic panel [[id number]] Collected: 07/13/20 2253    Specimen: Blood Updated: 07/13/20 2336     Glucose 84 mg/dL      BUN 64.3 mg/dL      Creatinine 0.7 mg/dL      Sodium 329 mEq/L      Potassium 3.5 mEq/L      Chloride 105 mEq/L  CO2 23 mEq/L      Calcium 9.3 mg/dL      Protein, Total 7.3 g/dL      Albumin 4.1 g/dL      AST (SGOT) 25 U/L      ALT 13 U/L      Alkaline Phosphatase 64 U/L      Bilirubin, Total 1.2 mg/dL      Globulin 3.2 g/dL      Albumin/Globulin Ratio 1.3     Anion Gap 10.0    CBC and differential [[id number]]  (Abnormal) Collected: 07/13/20 2253     Specimen: Blood Updated: 07/13/20 2313     WBC 7.75 x10 3/uL      Hgb 12.3 g/dL      Hematocrit 47.8 %      Platelets 158 x10 3/uL      RBC 3.87 x10 6/uL      MCV 91.2 fL      MCH 31.8 pg      MCHC 34.8 g/dL      RDW 13 %      MPV 10.7 fL      Neutrophils 52.3 %      Lymphocytes Automated 35.5 %      Monocytes 8.1 %      Eosinophils Automated 2.8 %      Basophils Automated 0.9 %      Immature Granulocytes 0.4 %      Nucleated RBC 0.0 /100 WBC      Neutrophils Absolute 4.05 x10 3/uL      Lymphocytes Absolute Automated 2.75 x10 3/uL      Monocytes Absolute Automated 0.63 x10 3/uL      Eosinophils Absolute Automated 0.22 x10 3/uL      Basophils Absolute Automated 0.07 x10 3/uL      Immature Granulocytes Absolute 0.03 x10 3/uL      Absolute NRBC 0.00 x10 3/uL           Recent CBC   Recent Labs     07/13/20  2253   RBC 3.87*   Hgb 12.3*   Hematocrit 35.3*   MCV 91.2   MCH 31.8   MCHC 34.8   RDW 13   MPV 10.7       Rads:   Radiological Procedure reviewed.    Signed by: Morton Peters, DO

## 2020-07-14 NOTE — ED Notes (Signed)
Lights dimmed, warm blanket given. Pt positioned for comfort. Pt on cardiac, bp, and pulse oximetry monitoring. Call bell in reach

## 2020-07-14 NOTE — Discharge Instr - AVS First Page (Addendum)
Reason for your Hospital Admission:  Chest Pain  Coronary artery disease  Epigastric abdominal pain  Small, non-obstructing left kidney stone       >>>You were observed in the hospital because you experienced chest discomfort. We monitored your heart and checked blood tests to ensure you were not having an active heart attack. You did not have a heart attack.  You were evaluated by cardiology and cleared for discharge. Your imaging showed non-obstructive coronary artery disease, cardiology team recommended you continue taking daily aspirin and statin for cardiac risk reduction. You should follow up with your primary care provided this week to ensure ongoing improvement. Incidentally, CT scan showed a small kidney stone, see more information below.     Instructions for after your discharge:  -- Schedule a follow up visit with your primary care provider - within 1 week of discharge from the hospital.  -- Follow up with cardiologist within 2-3 weeks for reevaluation.   -- Follow up with urologist as needed for kidney stone management, referral listed below if needed. Strain urine to catch stone if able to and take with you to office visit.   -- Check fasting labs with primary care provider or cardiologist (cholesterol panel and diabetes screening).     -- NEW: Aspirin EC 81 mg daily and atorvastatin (cholesterol medication) as recommended by cardiologist.   -- For mild to moderate pain, you may use OTC Tylenol as directed.     -- Hydrate well, activity as tolerated.  -- See attached for dietary education.   -- We recommend smoking cessation to reduce risk of further heart disease, cancer, + more (see below for more information).     -- Return to the nearest emergency department for chest pain, shortness of breath, fever >100.5 degrees, passage of solid red or brown urine (can't see through it) or urine with lots of blood clots, nausea/vomiting, unable to pass urine for 8 hours and increasing bladder pressure,  dizziness, passing out, profuse vomiting or diarrhea, sudden one sided numbness and/or weakness, severe headache, new confusion, trouble speaking, difficulty walking, facial droop, or any other concerning symptoms.

## 2020-07-14 NOTE — Progress Notes (Signed)
patient ambulating around unit and this RN noticed patient walking off unit "to look for a bathroom".     Pt shown bathroom on unit and told to stay on unit while ambulating.     patient seen again by this RN walking off the unit for a second time.     patient reminded he needs to stay on CCWG while walking.    Elopement risk flagged in chart.   Pt refusing to put on hospital gown.

## 2020-07-14 NOTE — Progress Notes (Signed)
Pt transferred to NT6, report called to RN, all belongings sent with patient

## 2020-07-14 NOTE — UM Notes (Signed)
07/14/20 0227  Adult Admit to Observation  Once         07/14/20 0227     Initial OBS review  Unit: Medicine/ Short stay    55 y.o. male with hx of HTN, HLD, DVT, gastritis, , followed by [facility], normal stress test aug 2021, also hypertension, hyperlipidemia, and gastritis presents to the ED with a chief complaint of sudden onset left-sided chest pain radiating down to left upper quadrant of the abdomen, as well as pain radiating to left neck and numbness over the left arm. 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    ED Course:  VS: 99.5, 100, 25, 141/95, 99%  Labs: hgb 12.3, hct 35.3,   CT Angio AAA Chest/Abdomen: negative  CXR: Low-volume film with dependent atelectasis  EKG: NSR with left anterior fascicular block  Meds: lidocaine 2% 10 ml po X 1, Maalox plus 30 ml po X 1, Pepcid 20 mg po X 1, Toradol 15 mg IV X 1, Morphine 2 mg IV X 1    Plan:  OBS  Cards consult - coronary CTA for direct anatomical assessment. If normal, patient may be discharged .   ASA, statin  Nitro and morphine prn  Lovenox sc for dvt ppx  NPO  GI meds  Drug screen    Dispo: pending Coronary CTA results    Pollyann Kennedy, RN, BSN CNRN  UR Case Manager   Utilization Review  [facility]   [address]  [address]  [place], [place] [address]  Phone : [phone] VM only  [place] [phone]  [email or web address]

## 2020-07-14 NOTE — ED Notes (Cosign Needed)
[facility] EMERGENCY DEPARTMENT RESIDENT H&P       CLINICAL INFORMATION        HPI:      Chief Complaint: Headache, Nausea, and Chest Pain  .    CORRIGAN KRETSCHMER is a 55 y.o. male with history of hypertension, hyperlipidemia, and gastritis presents to the ED with a chief complaint of sudden onset left-sided chest pain radiating down to left upper quadrant of the abdomen, as well as pain radiating to left neck and numbness over the left arm.  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  Patient describes the pain as sharp and pressure, associated with numbness over the left arm, but no diaphoresis or radiation to the back.  Denies ever having any cardiac problems in the past.  No recent trauma or travel.  Denies any drug use currently.  On ED arrival, patient's vitals within normal limits, and initial EKG on triage demonstrates some left fascicular block but no significant ST elevation or T wave changes.  Patient appears uncomfortable but not in significant distress.  Patient is allergic to multiple medications including NSAIDs which caused him to develop "moist swelling over the neck but not throat ", and similar reactions to Tylenol 3 and opioids.      Nursing (triage) note reviewed for the following pertinent information:  pt c/o left sided CP radaiting to left neck + n/v, and HA x 45 mins PTA. 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 Pt hx DVTs, not currently on blood thinners, follow up with cardiologist. Pt + lightheadedness/dizziness. Pt    Triage vitals: BP: (!) 141/95, Heart Rate: 100, Temp: 99.5 F (37.5 C), Resp Rate: 18, SpO2: 98 %, Weight: 78.9 kg        Initial Differential:           ROS:      Review of Systems   Constitutional: Negative for activity change, appetite change, chills, diaphoresis, fatigue, fever and unexpected weight change.   HENT: Negative for congestion, dental  problem, ear pain, sneezing and sore throat.    Respiratory: Negative for cough, chest tightness, shortness of breath, wheezing and stridor.    Cardiovascular: Positive for chest pain. Negative for palpitations and leg swelling.   Gastrointestinal: Positive for abdominal pain. Negative for constipation, diarrhea, nausea and vomiting.   Genitourinary: Negative for dysuria, flank pain, genital sores and hematuria.   Neurological: Positive for numbness and headaches. Negative for dizziness, tremors, seizures, syncope, speech difficulty, weakness and light-headedness.         Physical Exam:      Pulse 100  BP (!) 141/95  Resp 18  SpO2 98 %  Temp 99.5 F (37.5 C)    Physical Exam  Constitutional:       General: He is not in acute distress.     Appearance: Normal appearance. He is not ill-appearing.   HENT:      Head: Normocephalic and atraumatic.   Eyes:      Extraocular Movements: Extraocular movements intact.      Conjunctiva/sclera: Conjunctivae normal.   Cardiovascular:      Rate and Rhythm: Normal rate.      Heart sounds: No murmur heard.   No systolic murmur is present.    No friction rub. No gallop.      Comments: Equal radial pulses bilatrerally    Pulmonary:  Effort: Pulmonary effort is normal. No respiratory distress.      Breath sounds: Normal breath sounds. No stridor. No decreased breath sounds, wheezing, rhonchi or rales.   Chest:      Chest wall: No mass or tenderness.   Abdominal:      General: There is no distension.      Palpations: There is no mass.      Tenderness: There is abdominal tenderness. There is no right CVA tenderness, left CVA tenderness, guarding or rebound.      Hernia: No hernia is present.      Comments: LUQ   Musculoskeletal:         General: No swelling, tenderness, deformity or signs of injury.      Cervical back: Normal range of motion and neck supple.      Right lower leg: No edema.      Left lower leg: No edema.   Skin:     Coloration: Skin is not jaundiced or pale.       Findings: No bruising, erythema, lesion or rash.   Neurological:      General: No focal deficit present.      Mental Status: He is alert and oriented to person, place, and time.                 PAST HISTORY        Primary Care Provider: [name], MD        PMH/PSH:    .     Past Medical History:   Diagnosis Date   . Echocardiogram 07/2015, 08/2011   . Gallstones    . Gastritis    . Heart murmur    . Heroin abuse    . Syphilis        He has no past surgical history on file.      Social/Family History:      He reports that he has been smoking cigarettes. He has a 2.50 pack-year smoking history. He has never used smokeless tobacco. He reports previous alcohol use. He reports previous drug use. Drugs: Marijuana and Cocaine.    Family History   Problem Relation Age of Onset   . Diabetes Mother    . Hypertension Mother    . Other Father         Unknown disease         Listed Medications on Arrival:    .     Home Medications     Med List Status: In Progress Set By: Alease Medina, RN at 07/14/2020 12:05 AM        None on File         Allergies: He is allergic to nsaids, gluten, percocet [oxycodone-acetaminophen], and tylenol [acetaminophen].            VISIT INFORMATION        Reassessments/Clinical Course:             Lab Results;    Results     Procedure Component Value Units Date/Time    GFR [[id number]] Collected: 07/13/20 2253     Updated: 07/13/20 2336     EGFR >60.0    Comprehensive metabolic panel [[id number]] Collected: 07/13/20 2253    Specimen: Blood Updated: 07/13/20 2336     Glucose 84 mg/dL      BUN 78.2 mg/dL      Creatinine 0.7 mg/dL      Sodium 956 mEq/L      Potassium 3.5 mEq/L  Chloride 105 mEq/L      CO2 23 mEq/L      Calcium 9.3 mg/dL      Protein, Total 7.3 g/dL      Albumin 4.1 g/dL      AST (SGOT) 25 U/L      ALT 13 U/L      Alkaline Phosphatase 64 U/L      Bilirubin, Total 1.2 mg/dL      Globulin 3.2 g/dL      Albumin/Globulin Ratio 1.3     Anion Gap 10.0    CBC and differential  [[id number]]  (Abnormal) Collected: 07/13/20 2253    Specimen: Blood Updated: 07/13/20 2313     WBC 7.75 x10 3/uL      Hgb 12.3 g/dL      Hematocrit 42.5 %      Platelets 158 x10 3/uL      RBC 3.87 x10 6/uL      MCV 91.2 fL      MCH 31.8 pg      MCHC 34.8 g/dL      RDW 13 %      MPV 10.7 fL      Neutrophils 52.3 %      Lymphocytes Automated 35.5 %      Monocytes 8.1 %      Eosinophils Automated 2.8 %      Basophils Automated 0.9 %      Immature Granulocytes 0.4 %      Nucleated RBC 0.0 /100 WBC      Neutrophils Absolute 4.05 x10 3/uL      Lymphocytes Absolute Automated 2.75 x10 3/uL      Monocytes Absolute Automated 0.63 x10 3/uL      Eosinophils Absolute Automated 0.22 x10 3/uL      Basophils Absolute Automated 0.07 x10 3/uL      Immature Granulocytes Absolute 0.03 x10 3/uL      Absolute NRBC 0.00 x10 3/uL           Imaging Results;  Chest AP Portable    (Results Pending)   CT Angio AAA Chest/ Abdomen    (Results Pending)       EKG Report;        Conversations with Other Providers:              Medications Given in the ED:    .     ED Medication Orders (From admission, onward)    None            Procedures:      Procedures      Assessment/Plan:    CATHY ROPP is a 55 y.o. male with history of hypertension, hyperlipidemia, and gastritis presents to the ED with a chief complaint of sudden onset left-sided chest pain radiating down to left upper quadrant of the abdomen, as well as pain radiating to left neck and numbness over the left arm.  Differential diagnoses include ACS, aortic dissection, PE, and gastritis. Cannot use PERC due to age but wells criteria of 0, less likley to be PE. Concern for ACS and dissection but SBP wnl and equal upper extremities pulses.  Based on his description of the pain however we will out of abundance of caution check CBC, BMP, troponin, chest x-ray, and CTA chest and abdomen to evaluate for aortic dissection.  Based on his symptoms, we will also give GI cocktail and 50 mg IV Toradol  (given that his prior history of allergy to NSAIDs does not  seem to be truly anaphylactic).               Dorena Dew, MD  Resident  07/14/20 [phone]

## 2020-07-14 NOTE — ED Notes (Signed)
RN offered pt nitro for his chest pain, pt refused and states he has had that medication before and it gives him a really bad headache. Pt states he already has a headache. Provider notified

## 2020-07-14 NOTE — Plan of Care (Signed)
Day Team Update:     [name] [name] continues to experience abdominal pain. Note CT showed small non-obstructing stone in left kidney; could be contributing to abdominal pain but doubt significant contribution given size and location. Providing NSAID therapy for pain and Tamsulosin to assist with passage; routine outpatient Urology follow-up requested.     From a cardiac standpoint [name] [name] underwent CCTA today and FFR is pending; based on FFR results may require further inpatient workup versus discharge home.     [name], MD  [facility]  Jul 14, 2020    [facility] SecureChat Preferred  Pager [phone]

## 2020-07-15 DIAGNOSIS — R1030 Lower abdominal pain, unspecified: Secondary | ICD-10-CM | POA: Insufficient documentation

## 2020-07-15 DIAGNOSIS — F17210 Nicotine dependence, cigarettes, uncomplicated: Secondary | ICD-10-CM | POA: Insufficient documentation

## 2020-07-15 DIAGNOSIS — I2510 Atherosclerotic heart disease of native coronary artery without angina pectoris: Secondary | ICD-10-CM

## 2020-07-15 LAB — TROPONIN I: Troponin I: <0.01 ng/mL (ref 0.00–0.05)

## 2020-07-15 LAB — ECG 12-LEAD
IHS MUSE NARRATIVE AND IMPRESSION: NORMAL
P-R Interval: 156 ms
Q-T Interval: 380 ms

## 2020-07-15 MED ORDER — HYDROMORPHONE HCL 1 MG/ML IJ SOLN
1.0000 mg | Freq: Once | INTRAMUSCULAR | Status: AC
Start: 2020-07-15 — End: 2020-07-15
  Administered 2020-07-15: 1 mg via INTRAVENOUS
  Filled 2020-07-15: qty 1

## 2020-07-15 MED ORDER — ASPIRIN EC 81 MG PO TBEC
81.0000 mg | DELAYED_RELEASE_TABLET | Freq: Every day | ORAL | Status: AC
Start: 2020-07-15 — End: ?

## 2020-07-15 MED ORDER — ALUM & MAG HYDROXIDE-SIMETH 200-200-20 MG/5ML PO SUSP
ORAL | Status: AC
Start: 2020-07-15 — End: ?

## 2020-07-15 MED ORDER — ATORVASTATIN CALCIUM 40 MG PO TABS
40.0000 mg | ORAL_TABLET | Freq: Every evening | ORAL | 0 refills | Status: AC
Start: 2020-07-15 — End: ?

## 2020-07-15 NOTE — Progress Notes (Addendum)
[facility] PROGRESS NOTE  [facility]      Date Time: 07/15/20 9:08 AM  Patient Name: [name] [name], [name] [name]  Medical Record #:  [id number]  Account#:  [id number]  Admission Date:  07/13/2020         Patient Active Problem List   Diagnosis   . Chest pain syndrome   . Cocaine abuse   . Alcohol addiction   . Chest pain, unspecified   . Abdominal pain, other specified site   . Nausea & vomiting   . Substance abuse   . Chest pain, unspecified type   . Prediabetes   . Costochondral pain   . Encounter for screening for COVID-19   . Abnormal electrocardiogram   . Chest pain, precordial   . Pain in left leg   . Venous insufficiency of left lower extremity   . Chest pain       Subjective:   Patient reports that he continued to have significant abdominal pain over night     Assessment:    Chest pain - seems atypical in nature. No concern for ACS at this juncture    Non-obstructive coronary artery disease    CT angiogram coronary demonstrated moderate disease in pLAD and OM1   Prior normal treadmill stress test in 2020   Tobacco abuse - reports that he is trying to cut down         Recommendations:    Continue ASA 81 mg daily    Continue atorvastatin 40 mg nightly    Patient is not interested in risk factor modification with the aforementioned medications at this time. He would like to pursue and research a more holistic and non-pharmacological approach. He understands the risks and benefits. We also discussed the importance of tobacco cessation    Thank you for including [place] in the care of this patient. We will signs off at this time and we will arrange follow up      Attestation:  The patient has been seen and examined. VS reviewed. My Exam as below:    Physical Exam  Constitutional: Cooperative, alert, well-developed, well-nourished, in no acute distress  Cardiovascular: regular rate and rhythm, normal S1, S2, no S3, no S4, no murmurs, rubs or gallops  Neck: no carotid bruits, JVP normal  Lungs: clear to  auscultation bilaterally, without wheezing, rhonchi, or rales  Abdomen: soft, non-tender  Extremities: no edema  Pulses: +2 and equal pulses in all extremities  Musculoskeletal: normal strength and tone  Neurological: Appropriate affect, oriented x 3, intact and without gross motor defect    My assessment and plan as detailed above. In addition, please note the following:  Moderate coronary disease to LAD as outlined on CT-FFR. Recommend GDMT with statin, ASA, and BBlocker as above. Please do not hesitate to contact us should you have any questions regarding the care and management of this patient.     George Hugh, MD          Medications:      Scheduled Meds:    alum & mag hydroxide-simethicone, 30 mL, Oral, Once  aspirin EC, 81 mg, Oral, QHS  atorvastatin, 40 mg, Oral, QHS  enoxaparin, 40 mg, Subcutaneous, Daily  famotidine, 20 mg, Oral, Q12H SCH   Or  famotidine, 20 mg, Intravenous, Q12H SCH  lidocaine viscous, 10 mL, Mouth/Throat, Once  polyethylene glycol, 17 g, Oral, Daily  psyllium, 1 packet, Oral, Daily        Continuous Infusions:  Physical Exam:       VITAL SIGNS PHYSICAL EXAM   Vitals:    07/15/20 0641   BP: 119/78   Pulse: (!) 50   Resp: 12   Temp: 97.7 F (36.5 C)   SpO2: 98%     Temp (24hrs), Avg:97.9 F (36.6 C), Min:97.7 F (36.5 C), Max:98.2 F (36.8 C)      Telemetry: Reviewed no changes      Intake/Output Summary (Last 24 hours) at 07/15/2020 0908  Last data filed at 07/14/2020 1218  Gross per 24 hour   Intake --   Output 75 ml   Net -75 ml    Physical Exam  General: awake, alert, breathing comfortably, no acute distress  Head: normocephalic  Eyes: EOM's intact  Cardiovascular: regular rate and rhythm, normal S1, S2, no S3, no S4, no murmurs, rubs or gallops  Neck: no carotid bruits or JVD  Lungs: clear to auscultation bilaterally, without wheezing, rhonchi, or rales  Abdomen: soft, non-tender, non-distended; no palpable masses,  normoactive bowel sounds  Extremities: no  edema  Pulse: equal pulses, 4/4 symmetric  Neurological: Alert and oriented X3, mood and affect normal  Musculoskeletal: normal strength and tone         Labs:     Recent Labs   Lab 07/14/20  0919 07/14/20  0552 07/14/20  0257   Troponin I <0.01 <0.01 <0.01             Recent Labs   Lab 07/13/20  2253   Bilirubin, Total 1.2   Protein, Total 7.3   Albumin 4.1   ALT 13   AST (SGOT) 25             Recent Labs   Lab 07/13/20  2253   WBC 7.75   Hgb 12.3*   Hematocrit 35.3*   Platelets 158     Recent Labs   Lab 07/13/20  2253   Sodium 138   Potassium 3.5   Chloride 105   CO2 23   BUN 10.0   Creatinine 0.7   EGFR >60.0   Glucose 84   Calcium 9.3           Invalid input(s): FREET4    .  Lab Results   Component Value Date    BNP <10 05/30/2019        Weight Monitoring 03/08/2020 04/14/2020 04/28/2020 06/01/2020 06/06/2020 06/18/2020 07/13/2020   Height 174 cm 172.7 cm 167.6 cm 172.7 cm 172.7 cm 172.7 cm 172.7 cm   Height Method - Stated - Stated Stated Stated Stated   Weight 78.472 kg 79.833 kg 79.289 kg 80.287 kg 79.379 kg 79.379 kg 78.926 kg   Weight Method - Actual - Stated Stated Stated Stated   BMI (calculated) 26 kg/m2 26.8 kg/m2 28.3 kg/m2 27 kg/m2 26.7 kg/m2 26.7 kg/m2 26.5 kg/m2       Imaging:     07/14/2020 CT Angiogram Coronary  IMPRESSION:  1. Moderate stenosis (50-69%) of the proximal LAD with CT-FFR = 0.79  suggesting borderline stenosis (cutoff is approxiately 0.8).    2. Moderate stenosis of first obtuse marginal, with with predominantly  noncalcified atherosclerotic plaque. CT-FFR is nonischemic at 0.84.    3. Coronary artery calcium score = 27.  4. ADDENDUM:  this study has been addended to include CTFFR values.      ____________________________________________    Signed by: Milas Gain, MD, Cardiology Fellow       [facility]  APP Spectralink [phone] (8am-5pm)  MD Spectralink [phone] or [phone] (8am-5pm)  Arrhythmia Spectralink [phone] (8am-4:30pm)  Arrhythmia urgent consults or to reach the on-call MD  [phone]  After hours, non urgent consult line [phone]  After Hours, urgent consults or to reach the on-call MD [phone]

## 2020-07-15 NOTE — Discharge Summary (Signed)
MEDICINE PROGRESS NOTE/DISCHARGE SUMMARY    Date Time: 07/15/20 2:43 PM  Patient Name: [id number],JYNW R  Attending Physician: No att. providers found  Primary Care Physician: [name], MD    Date of Admission: 07/13/2020  Date of Discharge: 07/15/20    Discharge Dx:   -Chest pain   -0.5cm non-obstructive L renal stone     Chronic/history:   -HTN   -HLD  -Current tobacco use   -Hx of heroin abuse  -Varicose veins   -Gastritis     Disposition:  home     Discharge MEDICATIONS        Medication List      START taking these medications    alum & mag hydroxide-simethicone 200-200-20 mg/5 mL suspension  Commonly known as: MAALOX PLUS  Use as directed, as needed for acid reflux/heartburn. Available over the counter.     aspirin 81 MG EC tablet  Take 1 tablet (81 mg total) by mouth daily     atorvastatin 40 MG tablet  Commonly known as: LIPITOR  Take 1 tablet (40 mg total) by mouth nightly           Where to Get Your Medications      You can get these medications from any pharmacy    Bring a paper prescription for each of these medications   atorvastatin 40 MG tablet     Information about where to get these medications is not yet available    Ask your nurse or doctor about these medications   alum & mag hydroxide-simethicone 200-200-20 mg/5 mL suspension   aspirin 81 MG EC tablet         Consultations:   Treatment Team: Resident: Dorena Dew, MD; Scribe: Larwance Rote; Consulting Physician: Jerral Ralph, MD; Registered Nurse: Glenda Chroman, RN; Case Manager: Dorothea Ogle A   Recent Labs:   Recent Labs   Lab 07/13/20  2253   WBC 7.75   Hgb 12.3*   Hematocrit 35.3*   Platelets 158     Recent Labs   Lab 07/13/20  2253   Sodium 138   Potassium 3.5   Chloride 105   CO2 23   BUN 10.0   Creatinine 0.7   EGFR >60.0   Glucose 84   Calcium 9.3             Invalid input(s): FREET4   No results found for: T4. Recent Labs   Lab 07/14/20  0919 07/14/20  0552 07/14/20  0257   Troponin I <0.01 <0.01 <0.01                    Microbiology Results (last 15 days)     ** No results found for the last 360 hours. **        Procedures/Radiology performed:   Radiology: all results from this admission  Chest AP Portable    Result Date: 07/14/2020  1. Low-volume film with dependent atelectasis Marty Heck, MD  07/14/2020 12:29 AM    Chest AP Portable    Result Date: 06/18/2020  No acute abnormality. Johnsie Kindred, MD  06/18/2020 2:44 AM    CT Angiogram Coronary    Addendum Date: 07/14/2020    HISTORY: Chest pain. Evaluate for obstructive coronary atherosclerosis.. COMPARISON: CT angiogram chest abdomen 07/14/2020 TECHNIQUE: Spiral CT and CT angiography of the chest was performed on a 256-slice multiple-detector row CT system ([employer] Revolution). Initially unenhanced images of the chest were performed. Subsequently, 60 cc of  Omnipaque 350 was administered. Either prospective or retrospectively ECG-gated thin section images of the thorax were then acquired. Multiple sets of 2- and 3-dimensional reformatted images of the aorta, pulmonary arteries, heart, coronary arteries, and mediastinum were generated and reviewed independently on a 3D workstation. FINDINGS: CALCIUM SCORING: Left main:                 24 Left anterior descending:  3 Left circumflex:           0 Right Coronary:            0 Posterior Descending:      0 TOTAL CAC SCORE:           27 AGE/SEX MATCHED SCORE PERCENTILE: The estimated probability of a nonzero calcium score for this age, gender and race/ethnicity is 56% CHEST CTA FINDINGS: Heart within normal limits in size without pericardial effusion. Except for some mild bibasilar dependent atelectasis, no significant extra-cardiac findings in the limited views of the lungs and mediastinum. CT CORONARY ANGIOGRAPHY: Evaluation of the coronary arteries reveals the following: LEFT MAIN: The left main coronary artery demonstrates a widely patent vessel with minimal punctate atherosclerotic calcification distally. LEFT ANTERIOR  DESCENDING CORONARY ARTERY: The proximal LAD demonstrates moderate stenosis (50-69%) with probably noncalcified plaque, with CT-FFR = 0.79.  The proximal-to-mid LAD has mild stenosis (25-49%) with noncalcific atherosclerotic plaque. The rest the vessel is patent as it wraps around the apex. The first diagonal has mild (25-49) stenosis proximally. LEFT CIRCUMFLEX CORONARY ARTERY: The left circumflex coronary artery demonstrates a vessel that trifurcates near its origin. The first obtuse marginal has a moderate (50-69%) stenosis with predominantly noncalcified plaque, which is a small caliber vessel. The origin of the second obtuse marginal has mild (25-49%) predominantly noncalcific stenosis, and CT-FFR = 0.84. The AV groove circumflex has minimal (<25%) predominantly noncalcific stenosis (CT-FFR = 0.86) RIGHT CORONARY ARTERY: The RCA demonstrates a large dominant vessel with minimal scattered noncalcific atherosclerotic punctate lesions. It gives rise to 2 dominant RPLB branches. POSTERIOR DESCENDING CORONARY ARTERY:  The PDA arises from the right coronary artery. The vessel is patent without atherosclerosis. IMPRESSION: 1. Moderate stenosis (50-69%) of the proximal LAD with CT-FFR = 0.79 suggesting borderline stenosis (cutoff is approxiately 0.8).  2. Moderate stenosis of first obtuse marginal, with with predominantly noncalcified atherosclerotic plaque. CT-FFR is nonischemic at 0.84.  3. Coronary artery calcium score = 27. 4. ADDENDUM:  this study has been addended to include CTFFR values.  SAEED IBRAHIM  07/14/2020 4:38 PM    Result Date: 07/14/2020  1. Moderate stenosis (50-69%) of the proximal LAD and first obtuse marginal, with predominantly noncalcified atherosclerotic plaque. CT FFR has been ordered and is pending at the time of this dictation. 2. Coronary artery calcium score = 27. SAEED IBRAHIM  07/14/2020 2:29 PM    CT Angio AAA Chest/ Abdomen    Result Date: 07/14/2020  No aortic dissection or aneurysm.  Demetrios Isaacs, MD  07/14/2020 1:27 AM    CT HEARTFLOW FFRCT-DATA PREP AND TRANSMISSION    Addendum Date: 07/15/2020    HISTORY: Chest pain. Evaluate for obstructive coronary atherosclerosis.. COMPARISON: CT angiogram chest abdomen 07/14/2020 TECHNIQUE: Spiral CT and CT angiography of the chest was performed on a 256-slice multiple-detector row CT system ([employer] Revolution). Initially unenhanced images of the chest were performed. Subsequently, 60 cc of Omnipaque 350 was administered. Either prospective or retrospectively ECG-gated thin section images of the thorax were then acquired. Multiple sets of 2- and 3-dimensional reformatted images  of the aorta, pulmonary arteries, heart, coronary arteries, and mediastinum were generated and reviewed independently on a 3D workstation. FINDINGS: CALCIUM SCORING: Left main:                 24 Left anterior descending:  3 Left circumflex:           0 Right Coronary:            0 Posterior Descending:      0 TOTAL CAC SCORE:           27 AGE/SEX MATCHED SCORE PERCENTILE: The estimated probability of a nonzero calcium score for this age, gender and race/ethnicity is 56% CHEST CTA FINDINGS: Heart within normal limits in size without pericardial effusion. Except for some mild bibasilar dependent atelectasis, no significant extra-cardiac findings in the limited views of the lungs and mediastinum. CT CORONARY ANGIOGRAPHY: Evaluation of the coronary arteries reveals the following: LEFT MAIN: The left main coronary artery demonstrates a widely patent vessel with minimal punctate atherosclerotic calcification distally. LEFT ANTERIOR DESCENDING CORONARY ARTERY: The proximal LAD demonstrates moderate stenosis (50-69%) with probably noncalcified plaque, with CT-FFR = 0.79.  The proximal-to-mid LAD has mild stenosis (25-49%) with noncalcific atherosclerotic plaque. The rest the vessel is patent as it wraps around the apex. The first diagonal has mild (25-49) stenosis proximally. LEFT CIRCUMFLEX  CORONARY ARTERY: The left circumflex coronary artery demonstrates a vessel that trifurcates near its origin. The first obtuse marginal has a moderate (50-69%) stenosis with predominantly noncalcified plaque, which is a small caliber vessel. The origin of the second obtuse marginal has mild (25-49%) predominantly noncalcific stenosis, and CT-FFR = 0.84. The AV groove circumflex has minimal (<25%) predominantly noncalcific stenosis (CT-FFR = 0.86) RIGHT CORONARY ARTERY: The RCA demonstrates a large dominant vessel with minimal scattered noncalcific atherosclerotic punctate lesions. It gives rise to 2 dominant RPLB branches. POSTERIOR DESCENDING CORONARY ARTERY:  The PDA arises from the right coronary artery. The vessel is patent without atherosclerosis. IMPRESSION: 1. Moderate stenosis (50-69%) of the proximal LAD with CT-FFR = 0.79 suggesting borderline stenosis (cutoff is approxiately 0.8).  2. Moderate stenosis of first obtuse marginal, with with predominantly noncalcified atherosclerotic plaque. CT-FFR is nonischemic at 0.84.  3. Coronary artery calcium score = 27. 4. ADDENDUM:  this study has been addended to include CTFFR values.  Youlanda Mighty  07/14/2020 4:38 PM    Result Date: 07/15/2020  1. Moderate stenosis (50-69%) of the proximal LAD and first obtuse marginal, with predominantly noncalcified atherosclerotic plaque. CT FFR has been ordered and is pending at the time of this dictation. 2. Coronary artery calcium score = 27. SAEED IBRAHIM  07/14/2020 2:29 PM    CT HEARTFLOW FFRCT-ANALYSIS OF FLUID DYNAMICS    Addendum Date: 07/15/2020    HISTORY: Chest pain. Evaluate for obstructive coronary atherosclerosis.. COMPARISON: CT angiogram chest abdomen 07/14/2020 TECHNIQUE: Spiral CT and CT angiography of the chest was performed on a 256-slice multiple-detector row CT system ([employer]). Initially unenhanced images of the chest were performed. Subsequently, 60 cc of Omnipaque 350 was administered. Either  prospective or retrospectively ECG-gated thin section images of the thorax were then acquired. Multiple sets of 2- and 3-dimensional reformatted images of the aorta, pulmonary arteries, heart, coronary arteries, and mediastinum were generated and reviewed independently on a 3D workstation. FINDINGS: CALCIUM SCORING: Left main:                 24 Left anterior descending:  3 Left circumflex:  0 Right Coronary:            0 Posterior Descending:      0 TOTAL CAC SCORE:           27 AGE/SEX MATCHED SCORE PERCENTILE: The estimated probability of a nonzero calcium score for this age, gender and race/ethnicity is 56% CHEST CTA FINDINGS: Heart within normal limits in size without pericardial effusion. Except for some mild bibasilar dependent atelectasis, no significant extra-cardiac findings in the limited views of the lungs and mediastinum. CT CORONARY ANGIOGRAPHY: Evaluation of the coronary arteries reveals the following: LEFT MAIN: The left main coronary artery demonstrates a widely patent vessel with minimal punctate atherosclerotic calcification distally. LEFT ANTERIOR DESCENDING CORONARY ARTERY: The proximal LAD demonstrates moderate stenosis (50-69%) with probably noncalcified plaque, with CT-FFR = 0.79.  The proximal-to-mid LAD has mild stenosis (25-49%) with noncalcific atherosclerotic plaque. The rest the vessel is patent as it wraps around the apex. The first diagonal has mild (25-49) stenosis proximally. LEFT CIRCUMFLEX CORONARY ARTERY: The left circumflex coronary artery demonstrates a vessel that trifurcates near its origin. The first obtuse marginal has a moderate (50-69%) stenosis with predominantly noncalcified plaque, which is a small caliber vessel. The origin of the second obtuse marginal has mild (25-49%) predominantly noncalcific stenosis, and CT-FFR = 0.84. The AV groove circumflex has minimal (<25%) predominantly noncalcific stenosis (CT-FFR = 0.86) RIGHT CORONARY ARTERY: The RCA  demonstrates a large dominant vessel with minimal scattered noncalcific atherosclerotic punctate lesions. It gives rise to 2 dominant RPLB branches. POSTERIOR DESCENDING CORONARY ARTERY:  The PDA arises from the right coronary artery. The vessel is patent without atherosclerosis. IMPRESSION: 1. Moderate stenosis (50-69%) of the proximal LAD with CT-FFR = 0.79 suggesting borderline stenosis (cutoff is approxiately 0.8).  2. Moderate stenosis of first obtuse marginal, with with predominantly noncalcified atherosclerotic plaque. CT-FFR is nonischemic at 0.84.  3. Coronary artery calcium score = 27. 4. ADDENDUM:  this study has been addended to include CTFFR values.  Youlanda Mighty  07/14/2020 4:38 PM    Result Date: 07/15/2020  1. Moderate stenosis (50-69%) of the proximal LAD and first obtuse marginal, with predominantly noncalcified atherosclerotic plaque. CT FFR has been ordered and is pending at the time of this dictation. 2. Coronary artery calcium score = 27. SAEED IBRAHIM  07/14/2020 2:29 PM    ECHOCARDIOGRAM   Echo Results     None        Hospital Course:   Reason for admission/ HPI: This is a 55 y.o. male admitted 07/13/2020  for chest pain and epigastric + LUQ abdominal pain.     Hospital Course: EKG on admission NSR, LAFB, no ischemic changes. Troponin was cycled and remained negative. CBC and CMP were grossly unremarkable. CXR w/o acute abnormalities. CTA chest/abdomomen showed no aortic dissection or aneurysm, noted incidental 0.5cm non-obstructing calculus in lower pole of left kidney. He denies any nausea/vomiting, lightheadedness or dizziness.     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 Cardiology team cleared patient for discharge, recommended GDMT with ASA and statin, patient was given paper RX for statin as he  notified cardiology team he would like to hold off on medicine and pursue non-pharmacological approach, but notified bedside RN that he would "think about it".     He was also encouraged to quit tobacco use. informational handouts and resources were provided. He  was instructed to strain urine and follow up with urology. Return to ER instructions provided with AVS.       Should f/u with PCP in 3-5 days. Currently the patient is hemodynamically stable for discharge with outpatient follow up as outlined below.    Discharge Day Exam:  DISCHARGE DAY EXAM:  BP 116/77   Pulse (!) 50   Temp 98.3 F (36.8 C) (Oral)   Resp 12   Ht 1.727 m (5\' 8" )   Wt 78.9 kg (174 lb)   SpO2 98%   BMI 26.46 kg/m   Body mass index is 26.46 kg/m.    Physical Exam  Constitutional:       General: He is not in acute distress.     Appearance: Normal appearance. He is not ill-appearing or toxic-appearing.   Cardiovascular:      Rate and Rhythm: Normal rate and regular rhythm.   Pulmonary:      Effort: Pulmonary effort is normal. No respiratory distress.   Abdominal:      General: Bowel sounds are normal. There is no distension.      Palpations: Abdomen is soft.      Tenderness: There is no abdominal tenderness.   Musculoskeletal:      Right lower leg: No edema.      Left lower leg: No edema.   Skin:     General: Skin is warm and dry.   Neurological:      General: No focal deficit present.      Mental Status: He is alert. Mental status is at baseline.   Psychiatric:         Mood and Affect: Mood normal.         Thought Content: Thought content normal.        Discharge Condition & Coordination:     Condition: Stable    Coordination  Updated patient with discharge plan, all questions answered    Emergency Contact  Extended Emergency Contact Information  Primary Emergency Contact: Allbritton,Charles  Address: [address]           [place], [place] [address] [place]  Home Phone: [phone]  Mobile Phone: [phone]  Relation:  Father  Preferred language: English  Interpreter needed? No    Pending Results, Recommendations & Instructions to providers after discharge:   1. Micro / Labs / Path pending:   Unresulted Labs     None        2. Date of completion for antibiotics or other medications: n/a    Discharge Instructions:   Diet:: Cardiac Diet, Low Saturated Fat / Low Cholesterol Diet  Activity: as tolerated      Instructions for after your discharge:  -- Schedule a follow up visit with your primary care provider - within 1 week of discharge from the hospital.  -- Follow up with cardiologist within 2-3 weeks for reevaluation.   -- Follow up with urologist as needed for kidney stone management, referral listed below if needed. Strain urine to catch stone if able to and take with you to office visit.   -- Check fasting labs with primary care provider or cardiologist (cholesterol panel and diabetes screening).     -- NEW: Aspirin EC 81 mg daily and atorvastatin (cholesterol medication) as recommended by cardiologist.   -- For mild to moderate pain, you may use OTC Tylenol as directed.     -- Hydrate well, activity as tolerated.  -- See attached for dietary education.   --  We recommend smoking cessation to reduce risk of further heart disease, cancer, + more (see below for more information).     -- Return to the nearest emergency department for chest pain, shortness of breath, fever >100.5 degrees, passage of solid red or brown urine (can't see through it) or urine with lots of blood clots, nausea/vomiting, unable to pass urine for 8 hours and increasing bladder pressure, dizziness, passing out, profuse vomiting or diarrhea, sudden one sided numbness and/or weakness, severe headache, new confusion, trouble speaking, difficulty walking, facial droop, or any other concerning symptoms.    Complete instructions and follow up are in the patient's After Visit Summary    Minutes spent coordinating discharge and reviewing discharge plan:35 minutes      Follow-up Information     [name], MD Follow up in 1 week(s).    Specialty: Internal Medicine  Contact information:  [address]  [place] [address]  [phone]             Lysle Rubens, MD. Schedule an appointment as soon as possible for a visit.    Specialty: Cardiology  Why: follow up in 1-2 weeks  Contact information:  [address]  [address]  [place] [address]  [phone]             [facility]. Schedule an appointment as soon as possible for a visit.    Specialty: Urology  Why: follow up in 2-3 weeks for kidney stone  Contact information:  [address]  [place] [address]  [phone]                          Signed by: Caryn Bee, FNP  Discussed in detail with my attending provider: No att. providers found    [facility]  ADULT OBSERVATION UNIT [phone] F: [phone]  [facility]  Department of Medicine  P: [phone]  F: [phone]    CC: [name], MD  Please see attending note that follows this mid-level encounter note.

## 2020-07-15 NOTE — Plan of Care (Signed)
Progress Note    Shift note: Patient received to shift at 1900. Bedside report received from outgoing RN.  Introduced self to patient and white board updated.      General: VSS. A&Ox4. Patient endorses mild throbbing headache w/ anterior abd pain/discomfort that extends to chest but denies pain medication throughout shift  Neuro: Strong strength to upper extremities and moderate to lower extremities  Resp: WDL, clear on RA, denies SOB/DOE, no respiratory distress observed  Cardio: SB (49-50's) on tele, S1 S2 auscultated, regular rate & rhythm, endorses mild chest pain but declines pain intervention throughout shift   GI/GU: Continent x2, ambulates to restroom, abd soft/flat/nontender, bowel sounds active, endorses anterior abd pain/discomfort that extends to chest-declines pain intervention throughout shift, CTA chest/abd shows non-obstructive kidney stone  LBM: 07/12/2020  Integumentary: WDL  IV: 18G LAC, CDI, saline locked  Musk: Patient ambulates independently w/o assistive devices, low fall precautions in place    Plan:  Pain mgmt - PO morphine q8h, ibuprofen (declines all nsaids)  Tamsulosin to help pass stone  Monitor labs  Telemetry  Low fat diet  Urology f/u outpatient    Consult:  Cards following    Discharge Plan:  TBD    Social/Family Visits:  N/A    POC update: Patient updated on plan of care, verbalized understanding.         Problem: Safety  Goal: Patient will be free from injury during hospitalization  Outcome: Progressing  Flowsheets (Taken 07/14/2020 2200)  Patient will be free from injury during hospitalization:  . Assess patient's risk for falls and implement fall prevention plan of care per policy  . Provide and maintain safe environment  . Use appropriate transfer methods  . Ensure appropriate safety devices are available at the bedside  . Include patient/ family/ care giver in decisions related to safety  . Hourly rounding  Goal: Patient will be free from infection during  hospitalization  Outcome: Progressing  Flowsheets (Taken 07/14/2020 2200)  Free from Infection during hospitalization:  . Assess and monitor for signs and symptoms of infection  . Monitor lab/diagnostic results  . Monitor all insertion sites (i.e. indwelling lines, tubes, urinary catheters, and drains)  . Encourage patient and family to use good hand hygiene technique     Problem: Pain  Goal: Pain at adequate level as identified by patient  Outcome: Progressing  Flowsheets (Taken 07/14/2020 2200)  Pain at adequate level as identified by patient:  . Identify patient comfort function goal  . Assess for risk of opioid induced respiratory depression, including snoring/sleep apnea. Alert healthcare team of risk factors identified.  . Assess pain on admission, during daily assessment and/or before any "as needed" intervention(s)  . Reassess pain within 30-60 minutes of any procedure/intervention, per Pain Assessment, Intervention, Reassessment (AIR) Cycle  . Evaluate if patient comfort function goal is met  . Evaluate patient's satisfaction with pain management progress  . Offer non-pharmacological pain management interventions  . Include patient/patient care companion in decisions related to pain management as needed     Problem: Side Effects from Pain Analgesia  Goal: Patient will experience minimal side effects of analgesic therapy  Outcome: Progressing  Flowsheets (Taken 07/14/2020 2200)  Patient will experience minimal side effects of analgesic therapy:  . Monitor/assess patient's respiratory status (RR depth, effort, breath sounds)  . Assess for changes in cognitive function  . Prevent/manage side effects per LIP orders (i.e. nausea, vomiting, pruritus, constipation, urinary retention, etc.)  . Evaluate for opioid-induced sedation with  appropriate assessment tool (i.e. POSS)     Problem: Discharge Barriers  Goal: Patient will be discharged home or other facility with appropriate resources  Outcome:  Progressing  Flowsheets (Taken 07/14/2020 2200)  Discharge to home or other facility with appropriate resources:  . Provide appropriate patient education  . Provide information on available health resources     Problem: Psychosocial and Spiritual Needs  Goal: Demonstrates ability to cope with hospitalization/illness  Outcome: Progressing  Flowsheets (Taken 07/14/2020 2200)  Demonstrates ability to cope with hospitalizations/illness:  . Provide quiet environment  . Encourage verbalization of feelings/concerns/expectations  . Assist patient to identify own strengths and abilities  . Include patient/ patient care companion in decisions  . Encourage patient to set small goals for self     Problem: Hemodynamic Status: Cardiac  Goal: Stable vital signs and fluid balance  Outcome: Progressing  Flowsheets (Taken 07/14/2020 2200)  Stable vital signs and fluid balance:  . Monitor/assess vital signs and telemetry per unit protocol  . Assess signs and symptoms associated with cardiac rhythm changes  . Monitor intake/output per unit protocol and/or LIP order  . Monitor for leg swelling/edema and report to LIP if abnormal  . Monitor lab values     Problem: Inadequate Tissue Perfusion  Goal: Adequate tissue perfusion will be maintained  Outcome: Progressing  Flowsheets (Taken 07/14/2020 2200)  Adequate tissue perfusion will be maintained:  . Monitor/assess vital signs  . Monitor/assess lab values and report abnormal values  . Monitor/assess neurovascular status (pulses, capillary refill, pain, paresthesia, paralysis, presence of edema)  . Monitor/assess for signs of VTE (edema of calf/thigh redness, pain)  . Monitor intake and output  . Monitor for signs and symptoms of a pulmonary embolism (dyspnea, tachypnea, tachycardia, confusion)  . Perform active/passive ROM  . Increase mobility as tolerated/progressive mobility  . Position patient for maximum circulation/cardiac output  . Assess and monitor skin integrity     Problem: Chest  Pain  Goal: Vital signs and cardiac rhythm stable  Outcome: Progressing  Flowsheets (Taken 07/14/2020 2200)  Vital signs and cardiac rhythm stable:  Marland Kitchen Monitor Earnest Bailey vital signs/cardiac rhythms  . Monitor labs  Goal: Cardiac pain management  Outcome: Progressing  Flowsheets (Taken 07/14/2020 2200)  Cardiac pain management:  . Assess/report chest pain/or related discomfort to LIP immediately  . Instruct patient to report any change in pain status  . Assess pain/or related discomfort on admission, during daily assessment, before and after any intervention  . Include patient and patient care companion in decisions related to pain management  Goal: Anxiety management/effective coping  Outcome: Progressing  Flowsheets (Taken 07/14/2020 2200)  Anxiety management/effective coping:  . Assess/report uncontrolled anxiety, depression or ineffective coping to LIP  . Offer reassurance to decrease anxiety  . Encourage patient to immediately report any increase in anxiety and/or depression  . Include patient in decision making of their care and give updates on their health status  Goal: Patient/Patient Care Companion demonstrates understanding of disease process, treatment plan, medications, and discharge plan  Outcome: Progressing  Flowsheets (Taken 07/14/2020 2200)  Patient/Patient Care Companion demonstrates understanding of disease process, treatment plan, medications and discharge plan:  . Educate patient to immediately report any chest pain/equivalent to RN  . Reinforce with patient their activity level and to avoid Valsalva  . Assist patient/patient care companion to identify measures for cardiac risk factor management  . Educate patient/patient care companion regarding identified learning needs  . Plan for discharge  . Provide appropriate resources  and referrals     Problem: Renal Instability  Goal: Fluid and electrolyte balance are achieved/maintained  Outcome: Progressing  Flowsheets (Taken 07/14/2020 2200)  Fluid and  electrolyte balance are achieved/maintained:  . Monitor intake and output every shift  . Provide adequate hydration  . Monitor/assess lab values and report abnormal values  . Assess and reassess fluid and electrolyte status  . Monitor for muscle weakness  Goal: Free from infection  Outcome: Progressing  Flowsheets (Taken 07/14/2020 2200)  Free from infection: Monitor/assess for signs and symptoms of infection     Problem: Renal Calculi  Goal: Patient's pain/discomfort is managed per patient identified parameters  Outcome: Progressing  Flowsheets (Taken 07/14/2020 2200)  Patient's pain/discomfort is managed per patient identified parameters:  Marland Kitchen Medicate for pain as needed  . Reposition for comfort  . Apply warm compresses as needed  . Strain all urine for calculi  . Monitor lab values  . Increase mobility as tolerated/progressive mobility  . Monitor vital signs

## 2020-07-15 NOTE — ED Provider Notes (Signed)
Triage MD Note:  This patient has been seen by me or placed for labs/radiology studies per request of the Triage RN staff. I am not the primary provider.  Judi Saa, M.D.   9:56 PM      Discharged earlier today from admission for same pain.  Had coronary CT and [facility] consulted, not recommending further inpatient eval.  C/o continued pain.      Judi Saa, MD  07/15/20 2157

## 2020-07-15 NOTE — EDIE (Signed)
COLLECTIVE?NOTIFICATION?07/15/2020 21:51?FIRMAN, PETROW R?MRN: [id number]    Criteria Met      5 ED Visits in 12 Months      ED Care Guidelines  There are currently no ED Care Guidelines for this patient. Please check your facility's medical records system.        Prescription Monitoring Program  000??- Narcotic Use Score  000??- Sedative Use Score  000??- Stimulant Use Score  000??- Overdose Risk Score  - All Scores range from 000-999 with 75% of the population scoring < 200 and on 1% scoring above 650  - The last digit of the narcotic, sedative, and stimulant score indicates the number of active prescriptions of that type  - Higher Use scores correlate with increased prescribers, pharmacies, mg equiv, and overlapping prescriptions  - Higher Overdose Risk Scores correlate with increased risk of unintentional overdose death   Concerning or unexpectedly high scores should prompt a review of the PMP record; this does not constitute checking PMP for prescribing purposes.      E.D. Visit Count (12 mo.)  Facility Visits   [facility] 1   [facility] 5   Total 6   Note: Visits indicate total known visits.     Recent Emergency Department Visit Summary  Date Facility [facility] Type Diagnoses or Chief Complaint   Jul 15, 2020 [place] [facility] [place] [place] [facility]      Triage      Jul 13, 2020 [place] [facility] [place] [place] [facility]      Triage      Nausea      Chest Pain      Headache      Chest pain, unspecified      Jun 18, 2020 [place] [facility] [place] [facility] [place] [place] [facility]      Triage      Triage - CP      Emesis      Chest Pain      Nausea      Chest pain, unspecified      Jun 07, 2020 [place] [facility] [place] [facility] [place] [place] [facility]      Triage      Otalgia      Nasal congestion      Other chronic sinusitis      Jun 01, 2020 [place] [facility] [place] [facility] [place] [place] [facility]      Medic: CP #2      Chest Pain      Other chest pain      Unspecified disorder of tympanic membrane, left ear      Apr 14, 2020 [facility] [name] [place] [facility]      Chest Pain and Leg Pain      Leg Pain      Chest Pain      Otitis media, unspecified, unspecified ear      Pain in left leg      Chest pain, unspecified          Recent Inpatient Visit Summary  No recorded inpatient visits.     Care Team  Provider Specialty Phone Fax Service Dates   [name] , MD Internal Medicine   Current      Collective Portal  This patient has registered at the [facility] Emergency Department   For more information visit: [email or web address]     PLEASE NOTE:     1.   Any care recommendations  and other clinical information are provided as guidelines or for historical purposes only, and providers should exercise their own clinical judgment when providing care.    2.   You may only use this information for purposes of treatment, payment or health care operations activities, and subject to the limitations of applicable [employer].    3.   You should consult directly with the organization that provided a care guideline or other clinical history with any questions about additional information or accuracy or completeness of information provided.    ? 2022 [employer] - www.[employer]medical.com

## 2020-07-15 NOTE — Plan of Care (Signed)
Cross cover    Paged by RN regarding severe abdominal pain. Had tried PO morphine yesterday with limited therapeutic benefit. Has been trying to "endure the pain" all night but now has gotten quite severe. Will give one time dose of dilaudid 1 mg IV

## 2020-07-15 NOTE — Progress Notes (Signed)
DISCHARGE NOTE    Date Time: 07/15/20 1:25 PM  Patient Name: NFAOZH,YQMV R  Attending Physician: Jerral Ralph, MD    Date of Admission:  07/13/2020    Date of Discharge:  07/15/20    Reason for Admission:  Chest pain [R07.9]    Problems:  Chest pain    AOx4, IV out, catheter intact.  Tele off.    Vitals:    07/15/20 0427 07/15/20 0641 07/15/20 0900 07/15/20 1100   BP: 114/74 119/78 116/77    Pulse: (!) 53 (!) 50     Resp: 17 12  12    Temp: 97.9 F (36.6 C) 97.7 F (36.5 C)  98.3 F (36.8 C)   TempSrc: Oral Oral  Oral   SpO2: 99% 98%     Weight:       Height:       .  Chest pain evaluated by cardiology, CTA coronary showed moderate stenosis, patient education provided. Patient verbalizes understanding of medication and follow-up.     Education on Statins and Aspirin and heart disease risk modification provided, patient verbalized understanding.    Patient has all belongings, and own transportation home.

## 2020-07-15 NOTE — Plan of Care (Signed)
Adult Observation Progress Note    General: Alert, cooperative  Neuro: AOx4, no deficits  Cardio: S1 S2 auscultated, Sinus brady on tele down to 43 bpm, patient endorses athletic activity and healthy diet. Endorses epigastic pain, chest pressure. Epigastric pain is somewhat relieved by position changes.   Resp: Clear on room air, no SOB  Integ: intact, warm, dry  MSK: full ROM and weight bearing all extremities, ambulates independently, patient is a physical trainer  GI: endorses epigastric pain, appetite unaffected at this time. Bowel sounds are active, epigastric area TTP, pain is non radiating  GU: continent  IV Access: C/D/I saline locked     BM during shift: miralax administered    Pending Orders: discharge order is in pending rounds    Discharge Plan: home    Vitals:    07/14/20 1923 07/15/20 0010 07/15/20 0427 07/15/20 0641   BP: 112/70 133/76 114/74 119/78   Pulse: 65 (!) 55 (!) 53 (!) 50   Resp: 17 16 17 12    Temp: 98.2 F (36.8 C) 98.1 F (36.7 C) 97.9 F (36.6 C) 97.7 F (36.5 C)   TempSrc: Oral Oral Oral Oral   SpO2: 100% 100% 99% 98%   Weight:       Height:           Patient Lines/Drains/Airways Status       Active Lines, Drains and Airways       Name Placement date Placement time Site Days    Peripheral IV 07/13/20 18 G Left Antecubital 07/13/20  2251  Antecubital  1                    Problem: Safety  Goal: Patient will be free from injury during hospitalization  Outcome: Progressing  Flowsheets (Taken 07/14/2020 2200 by Debby Freiberg, RN)  Patient will be free from injury during hospitalization:   Assess patient's risk for falls and implement fall prevention plan of care per policy   Provide and maintain safe environment   Use appropriate transfer methods   Ensure appropriate safety devices are available at the bedside   Include patient/ family/ care giver in decisions related to safety   Hourly rounding  Goal: Patient will be free from infection during hospitalization  Outcome:  Progressing  Flowsheets (Taken 07/14/2020 2200 by Debby Freiberg, RN)  Free from Infection during hospitalization:   Assess and monitor for signs and symptoms of infection   Monitor lab/diagnostic results   Monitor all insertion sites (i.e. indwelling lines, tubes, urinary catheters, and drains)   Encourage patient and family to use good hand hygiene technique     Problem: Pain  Goal: Pain at adequate level as identified by patient  Outcome: Progressing  Flowsheets (Taken 07/14/2020 2200 by Debby Freiberg, RN)  Pain at adequate level as identified by patient:   Identify patient comfort function goal   Assess for risk of opioid induced respiratory depression, including snoring/sleep apnea. Alert healthcare team of risk factors identified.   Assess pain on admission, during daily assessment and/or before any "as needed" intervention(s)   Reassess pain within 30-60 minutes of any procedure/intervention, per Pain Assessment, Intervention, Reassessment (AIR) Cycle   Evaluate if patient comfort function goal is met   Evaluate patient's satisfaction with pain management progress   Offer non-pharmacological pain management interventions   Include patient/patient care companion in decisions related to pain management as needed     Problem: Side Effects from Pain Analgesia  Goal: Patient will experience  minimal side effects of analgesic therapy  Outcome: Progressing  Flowsheets (Taken 07/14/2020 2200 by Debby Freiberg, RN)  Patient will experience minimal side effects of analgesic therapy:   Monitor/assess patient's respiratory status (RR depth, effort, breath sounds)   Assess for changes in cognitive function   Prevent/manage side effects per LIP orders (i.e. nausea, vomiting, pruritus, constipation, urinary retention, etc.)   Evaluate for opioid-induced sedation with appropriate assessment tool (i.e. POSS)     Problem: Discharge Barriers  Goal: Patient will be discharged home or other facility with appropriate resources  Outcome:  Progressing  Flowsheets (Taken 07/14/2020 2200 by Debby Freiberg, RN)  Discharge to home or other facility with appropriate resources:   Provide appropriate patient education   Provide information on available health resources     Problem: Psychosocial and Spiritual Needs  Goal: Demonstrates ability to cope with hospitalization/illness  Outcome: Progressing  Flowsheets (Taken 07/14/2020 2200 by Debby Freiberg, RN)  Demonstrates ability to cope with hospitalizations/illness:   Provide quiet environment   Encourage verbalization of feelings/concerns/expectations   Assist patient to identify own strengths and abilities   Include patient/ patient care companion in decisions   Encourage patient to set small goals for self     Problem: Hemodynamic Status: Cardiac  Goal: Stable vital signs and fluid balance  Outcome: Progressing  Flowsheets (Taken 07/14/2020 2200 by Debby Freiberg, RN)  Stable vital signs and fluid balance:   Monitor/assess vital signs and telemetry per unit protocol   Assess signs and symptoms associated with cardiac rhythm changes   Monitor intake/output per unit protocol and/or LIP order   Monitor for leg swelling/edema and report to LIP if abnormal   Monitor lab values     Problem: Inadequate Tissue Perfusion  Goal: Adequate tissue perfusion will be maintained  Outcome: Progressing  Flowsheets (Taken 07/14/2020 2200 by Debby Freiberg, RN)  Adequate tissue perfusion will be maintained:   Monitor/assess vital signs   Monitor/assess lab values and report abnormal values   Monitor/assess neurovascular status (pulses, capillary refill, pain, paresthesia, paralysis, presence of edema)   Monitor/assess for signs of VTE (edema of calf/thigh redness, pain)   Monitor intake and output   Monitor for signs and symptoms of a pulmonary embolism (dyspnea, tachypnea, tachycardia, confusion)   Perform active/passive ROM   Increase mobility as tolerated/progressive mobility   Position patient for maximum circulation/cardiac output    Assess and monitor skin integrity     Problem: Chest Pain  Goal: Vital signs and cardiac rhythm stable  Outcome: Progressing  Flowsheets (Taken 07/14/2020 2200 by Debby Freiberg, RN)  Vital signs and cardiac rhythm stable:   Monitor /assess vital signs/cardiac rhythms   Monitor labs  Goal: Cardiac pain management  Outcome: Progressing  Flowsheets (Taken 07/14/2020 2200 by Debby Freiberg, RN)  Cardiac pain management:   Assess/report chest pain/or related discomfort to LIP immediately   Instruct patient to report any change in pain status   Assess pain/or related discomfort on admission, during daily assessment, before and after any intervention   Include patient and patient care companion in decisions related to pain management  Goal: Anxiety management/effective coping  Outcome: Progressing  Flowsheets (Taken 07/14/2020 2200 by Debby Freiberg, RN)  Anxiety management/effective coping:   Assess/report uncontrolled anxiety, depression or ineffective coping to LIP   Offer reassurance to decrease anxiety   Encourage patient to immediately report any increase in anxiety and/or depression   Include patient in decision making of their care and give updates on their health  status  Goal: Patient/Patient Care Companion demonstrates understanding of disease process, treatment plan, medications, and discharge plan  Outcome: Progressing  Flowsheets (Taken 07/14/2020 2200 by Debby Freiberg, RN)  Patient/Patient Care Companion demonstrates understanding of disease process, treatment plan, medications and discharge plan:   Educate patient to immediately report any chest pain/equivalent to RN   Reinforce with patient their activity level and to avoid Valsalva   Assist patient/patient care companion to identify measures for cardiac risk factor management   Educate patient/patient care companion regarding identified learning needs   Plan for discharge   Provide appropriate resources and referrals     Problem: Renal Instability  Goal: Fluid and  electrolyte balance are achieved/maintained  Outcome: Progressing  Flowsheets (Taken 07/14/2020 2200 by Debby Freiberg, RN)  Fluid and electrolyte balance are achieved/maintained:   Monitor intake and output every shift   Provide adequate hydration   Monitor/assess lab values and report abnormal values   Assess and reassess fluid and electrolyte status   Monitor for muscle weakness  Goal: Free from infection  Outcome: Progressing  Flowsheets (Taken 07/14/2020 2200 by Debby Freiberg, RN)  Free from infection: Monitor/assess for signs and symptoms of infection     Problem: Renal Calculi  Goal: Patient's pain/discomfort is managed per patient identified parameters  Outcome: Progressing  Flowsheets (Taken 07/14/2020 2200 by Debby Freiberg, RN)  Patient's pain/discomfort is managed per patient identified parameters:   Medicate for pain as needed   Reposition for comfort   Apply warm compresses as needed   Strain all urine for calculi   Monitor lab values   Increase mobility as tolerated/progressive mobility   Monitor vital signs

## 2020-07-16 ENCOUNTER — Emergency Department (EMERGENCY_DEPARTMENT_HOSPITAL)
Admission: EM | Admit: 2020-07-16 | Discharge: 2020-07-16 | Disposition: A | Discharge status: Home / Self Care | Payer: 59 | Source: Home / Self Care | Attending: Emergency Medicine | Admitting: Emergency Medicine

## 2020-07-16 ENCOUNTER — Emergency Department: Payer: 59

## 2020-07-16 DIAGNOSIS — R1030 Lower abdominal pain, unspecified: Secondary | ICD-10-CM

## 2020-07-16 LAB — URINALYSIS, REFLEX TO MICROSCOPIC EXAM IF INDICATED
Bilirubin, UA: NEGATIVE
Blood, UA: NEGATIVE
Glucose, UA: NEGATIVE
Ketones UA: 20 — AB
Leukocyte Esterase, UA: NEGATIVE
Nitrite, UA: NEGATIVE
Protein, UR: NEGATIVE
Specific Gravity UA: 1.02 (ref 1.001–1.035)
Urine pH: 6 (ref 5.0–8.0)
Urobilinogen, UA: NORMAL mg/dL (ref 0.2–2.0)

## 2020-07-16 LAB — BASIC METABOLIC PANEL
Anion Gap: 10 (ref 5.0–15.0)
BUN: 13 mg/dL (ref 9.0–28.0)
CO2: 23 mEq/L (ref 22–29)
Calcium: 9.3 mg/dL (ref 8.5–10.5)
Chloride: 104 mEq/L (ref 100–111)
Creatinine: 0.8 mg/dL (ref 0.7–1.3)
Glucose: 81 mg/dL (ref 70–100)
Potassium: 4 mEq/L (ref 3.5–5.1)
Sodium: 137 mEq/L (ref 136–145)

## 2020-07-16 LAB — CBC AND DIFFERENTIAL
Absolute NRBC: 0 10*3/uL (ref 0.00–0.00)
Basophils Absolute Automated: 0.06 10*3/uL (ref 0.00–0.08)
Basophils Automated: 0.6 %
Eosinophils Absolute Automated: 0.18 10*3/uL (ref 0.00–0.44)
Eosinophils Automated: 1.9 %
Hematocrit: 36.2 % — ABNORMAL LOW (ref 37.6–49.6)
Hgb: 12.3 g/dL — ABNORMAL LOW (ref 12.5–17.1)
Immature Granulocytes Absolute: 0.03 10*3/uL (ref 0.00–0.07)
Immature Granulocytes: 0.3 %
Lymphocytes Absolute Automated: 2.75 10*3/uL (ref 0.42–3.22)
Lymphocytes Automated: 29.1 %
MCH: 31.4 pg (ref 25.1–33.5)
MCHC: 34 g/dL (ref 31.5–35.8)
MCV: 92.3 fL (ref 78.0–96.0)
MPV: 11.2 fL (ref 8.9–12.5)
Monocytes Absolute Automated: 0.92 10*3/uL — ABNORMAL HIGH (ref 0.21–0.85)
Monocytes: 9.7 %
Neutrophils Absolute: 5.51 10*3/uL (ref 1.10–6.33)
Neutrophils: 58.4 %
Nucleated RBC: 0 /100 WBC (ref 0.0–0.0)
Platelets: 140 10*3/uL — ABNORMAL LOW (ref 142–346)
RBC: 3.92 10*6/uL — ABNORMAL LOW (ref 4.20–5.90)
RDW: 13 % (ref 11–15)
WBC: 9.45 10*3/uL (ref 3.10–9.50)

## 2020-07-16 LAB — ECG 12-LEAD
Atrial Rate: 81 {beats}/min
P Axis: 49 degrees
QRS Duration: 92 ms
QTC Calculation (Bezet): 441 ms
R Axis: -40 degrees
T Axis: 25 degrees
Ventricular Rate: 81 {beats}/min

## 2020-07-16 LAB — LIPASE: Lipase: 19 U/L (ref 8–78)

## 2020-07-16 LAB — GFR: EGFR: >60

## 2020-07-16 MED ORDER — OXYCODONE-ACETAMINOPHEN 5-325 MG PO TABS
1.0000 | ORAL_TABLET | ORAL | 0 refills | Status: DC | PRN
Start: 2020-07-16 — End: 2020-07-16

## 2020-07-16 MED ORDER — NALOXONE HCL 4 MG/0.1ML NA LIQD
NASAL | 0 refills | Status: DC
Start: 2020-07-16 — End: 2020-07-16

## 2020-07-16 MED ORDER — MORPHINE SULFATE 4 MG/ML IJ/IV SOLN (WRAP)
4.0000 mg | Freq: Once | Status: AC
Start: 2020-07-16 — End: 2020-07-16
  Administered 2020-07-16: 4 mg via INTRAVENOUS
  Filled 2020-07-16: qty 1

## 2020-07-16 MED ORDER — HYDROMORPHONE HCL 1 MG/ML IJ SOLN
1.0000 mg | Freq: Once | INTRAMUSCULAR | Status: AC
Start: 2020-07-16 — End: 2020-07-16
  Administered 2020-07-16: 1 mg via INTRAVENOUS
  Filled 2020-07-16: qty 1

## 2020-07-16 MED ORDER — ONDANSETRON HCL 4 MG/2ML IJ SOLN
4.0000 mg | Freq: Once | INTRAMUSCULAR | Status: AC
Start: 2020-07-16 — End: 2020-07-16
  Administered 2020-07-16: 4 mg via INTRAVENOUS
  Filled 2020-07-16: qty 2

## 2020-07-16 MED ORDER — SODIUM CHLORIDE 0.9 % IV BOLUS
1000.0000 mL | Freq: Once | INTRAVENOUS | Status: AC
Start: 2020-07-16 — End: 2020-07-16
  Administered 2020-07-16: 1000 mL via INTRAVENOUS

## 2020-07-16 NOTE — ED Notes (Signed)
MD Omorogbe at bedside to give discharge instructions and prescription pick up information. Patient verbalized understanding.     PIV removed.     Patient given phone to call for a ride.  Patient ambulates appropriately without assistance.

## 2020-07-16 NOTE — Progress Notes (Signed)
Pt requested help with d/c to home therefore this social worker provided a cab to home address. No other needs at this time.

## 2020-07-16 NOTE — ED Provider Notes (Signed)
[facility] EMERGENCY DEPARTMENT H&P      Visit date: 07/16/2020      CLINICAL SUMMARY           Diagnosis:    .     Final diagnoses:   Lower abdominal pain         MDM Notes:    Patient with flank and abdominal discomfort.  CT without evidence of acute intra-abdominal surgical pathology.  Symptoms improved in ED.  Nontoxic at time of ED discharge.           Disposition:      Discharge         Discharge Prescriptions     Medication Sig Dispense Auth. Provider    oxyCODONE-acetaminophen (PERCOCET) 5-325 MG per tablet Take 1 tablet by mouth every 4 (four) hours as needed for Pain 12 tablet Omorogbe, Ashleigh A, MD    naloxone (NARCAN) 4 MG/0.1ML nasal spray 1 spray intranasally. If pt does not respond or relapses into respiratory depression call 911. Give additional doses every 2-3 min. 2 each Omorogbe, Candi Leash, MD                      CLINICAL INFORMATION        HPI:      Chief Complaint: Abdominal Pain and Chest Pain  .    [name] [name] is a 55 y.o. male who presents with chest and abdominal pain.  Patient localizes pain to left side.  Epigastric pain also noted.  No frank left substernal chest pain.  No dyspnea.  Had prior episode recently.  Area.  No blood in urine.    History obtained from: Patient          ROS:      Positive and negative ROS elements as per HPI.  All other systems reviewed and negative.      Physical Exam:      Pulse (!) 112  BP 123/82  Resp 20  SpO2 100 %  Temp 98.7 F (37.1 C)    Physical Exam  Vitals and nursing note reviewed.   Constitutional:       General: He is not in acute distress.     Appearance: He is well-developed and normal weight. He is not ill-appearing.   HENT:      Head: Normocephalic and atraumatic.      Mouth/Throat:      Mouth: Mucous membranes are moist.      Pharynx: Oropharynx is clear.   Cardiovascular:      Rate and Rhythm: Normal rate and regular rhythm.      Heart sounds: Normal heart sounds. No murmur heard.  Pulmonary:       Effort: Pulmonary effort is normal.      Breath sounds: Normal breath sounds.   Abdominal:      General: Abdomen is flat. There are no signs of injury.      Palpations: Abdomen is soft.      Tenderness: There is generalized abdominal tenderness. There is no guarding or rebound.   Skin:     General: Skin is warm.      Capillary Refill: Capillary refill takes less than 2 seconds.      Coloration: Skin is not cyanotic or mottled.   Neurological:      General: No focal deficit present.      Mental Status: He is alert.   Psychiatric:         Mood and Affect:  Mood normal.         Behavior: Behavior normal.                  PAST HISTORY        Primary Care Provider: [name], MD        PMH/PSH:    .     Past Medical History:   Diagnosis Date   . Echocardiogram 07/2015, 08/2011   . Gallstones    . Gastritis    . Heart murmur    . Heroin abuse    . Syphilis        He has no past surgical history on file.      Social/Family History:      He reports that he has been smoking cigarettes. He has a 2.50 pack-year smoking history. He has never used smokeless tobacco. He reports previous alcohol use. He reports previous drug use. Drugs: Marijuana and Cocaine.    Family History   Problem Relation Age of Onset   . Diabetes Mother    . Hypertension Mother    . Other Father         Unknown disease         Listed Medications on Arrival:    .     Home Medications     Med List Status: In Progress Set By: Mayer Masker, RN at 07/16/2020  1:45 AM                alum & mag hydroxide-simethicone (MAALOX PLUS) 200-200-20 mg/5 mL suspension     Use as directed, as needed for acid reflux/heartburn. Available over the counter.     aspirin EC 81 MG EC tablet     Take 1 tablet (81 mg total) by mouth daily     atorvastatin (LIPITOR) 40 MG tablet     Take 1 tablet (40 mg total) by mouth nightly         Allergies: He is allergic to nsaids, percocet [oxycodone-acetaminophen], and tylenol [acetaminophen].            VISIT INFORMATION         Clinical Course in the ED:                 Medications Given in the ED:    .     ED Medication Orders (From admission, onward)    Start Ordered     Status Ordering Provider    07/16/20 [phone] 07/16/20 0610  HYDROmorphone (DILAUDID) injection 1 mg  Once        Route: Intravenous  Ordered Dose: 1 mg     Last MAR action: Given D'CRUZ, Felcia Huebert J    07/16/20 0153 07/16/20 0152  sodium chloride 0.9 % bolus 1,000 mL  Once        Route: Intravenous  Ordered Dose: 1,000 mL     Last MAR action: Stopped Benny Lennert    07/16/20 0153 07/16/20 0152  morphine injection 4 mg  Once        Route: Intravenous  Ordered Dose: 4 mg     Last MAR action: Given KOVALESKI, CHRISTOPHER J    07/16/20 0153 07/16/20 0152  ondansetron (ZOFRAN) injection 4 mg  Once        Route: Intravenous  Ordered Dose: 4 mg     Last MAR action: Given KOVALESKI, CHRISTOPHER J            Procedures:      Procedures  Interpretations:                     RESULTS        Lab Results:      Results     Procedure Component Value Units Date/Time    Urinalysis Reflex to Microscopic Exam [[id number]]  (Abnormal) Collected: 07/16/20 0503    Specimen: Urine Updated: 07/16/20 0533     Urine Type Clean Catch     Color, UA Yellow     Clarity, UA Clear     Specific Gravity UA 1.020     Urine pH 6.0     Leukocyte Esterase, UA Negative     Nitrite, UA Negative     Protein, UR Negative     Glucose, UA Negative     Ketones UA 20     Urobilinogen, UA Normal mg/dL      Bilirubin, UA Negative     Blood, UA Negative    Lipase [[id number]] Collected: 07/15/20 2229     Updated: 07/16/20 0256     Lipase 19 U/L     Basic Metabolic Panel [[id number]] Collected: 07/15/20 2229    Specimen: Blood Updated: 07/16/20 0256     Glucose 81 mg/dL      BUN 08.6 mg/dL      Creatinine 0.8 mg/dL      Calcium 9.3 mg/dL      Sodium 578 mEq/L      Potassium 4.0 mEq/L      Chloride 104 mEq/L      CO2 23 mEq/L      Anion Gap 10.0    GFR [[id number]] Collected: 07/15/20 2229     Updated: 07/16/20  0256     EGFR >60.0    CBC and differential [[id number]]  (Abnormal) Collected: 07/16/20 0138    Specimen: Blood Updated: 07/16/20 0253     WBC 9.45 x10 3/uL      Hgb 12.3 g/dL      Hematocrit 27.2 %      Platelets 140 x10 3/uL      RBC 3.92 x10 6/uL      MCV 92.3 fL      MCH 31.4 pg      MCHC 34.0 g/dL      RDW 13 %      MPV 11.2 fL      Neutrophils 58.4 %      Lymphocytes Automated 29.1 %      Monocytes 9.7 %      Eosinophils Automated 1.9 %      Basophils Automated 0.6 %      Immature Granulocytes 0.3 %      Nucleated RBC 0.0 /100 WBC      Neutrophils Absolute 5.51 x10 3/uL      Lymphocytes Absolute Automated 2.75 x10 3/uL      Monocytes Absolute Automated 0.92 x10 3/uL      Eosinophils Absolute Automated 0.18 x10 3/uL      Basophils Absolute Automated 0.06 x10 3/uL      Immature Granulocytes Absolute 0.03 x10 3/uL      Absolute NRBC 0.00 x10 3/uL     Troponin I [[id number]] Collected: 07/15/20 2229    Specimen: Blood Updated: 07/15/20 2315     Troponin I <0.01 ng/mL               Radiology Results:      CT Abd/ Pelvis without Contrast   Final Result      1.  No acute abnormality.   2.  Left nephrolithiasis.      Demetrios Isaacs, MD    07/16/2020 3:04 AM                  Scribe Attestation:      No scribe involved in the care of this patient              Ardelle Anton, MD  07/16/20 [phone]

## 2020-07-16 NOTE — ED Provider Notes (Signed)
[facility] PEDIATRIC EMERGENCY DEPARTMENT FELLOW H&P      Visit date: 07/16/2020      CLINICAL SUMMARY          Diagnosis:    .     Final diagnoses:   Lower abdominal pain         MDM Notes:      55 year old with previously noted nonobstructive nephrolithiasis, 50-69% stenosis of proximal LAD who presents with concern for worsening abdominal pain.  Hemodynamically stable, mentating appropriately.  Physical exam notable for left lower quadrant tenderness as well as epigastric tenderness.    CT Abd/ Pelvis without Contrast   Final Result      1.  No acute abnormality.   2.  Left nephrolithiasis.      Demetrios Isaacs, MD    07/16/2020 3:04 AM        Results     Procedure Component Value Units Date/Time    Urinalysis Reflex to Microscopic Exam [[id number]]  (Abnormal) Collected: 07/16/20 0503    Specimen: Urine Updated: 07/16/20 0533     Urine Type Clean Catch     Color, UA Yellow     Clarity, UA Clear     Specific Gravity UA 1.020     Urine pH 6.0     Leukocyte Esterase, UA Negative     Nitrite, UA Negative     Protein, UR Negative     Glucose, UA Negative     Ketones UA 20     Urobilinogen, UA Normal mg/dL      Bilirubin, UA Negative     Blood, UA Negative    Lipase [[id number]] Collected: 07/15/20 2229     Updated: 07/16/20 0256     Lipase 19 U/L     Basic Metabolic Panel [[id number]] Collected: 07/15/20 2229    Specimen: Blood Updated: 07/16/20 0256     Glucose 81 mg/dL      BUN 40.1 mg/dL      Creatinine 0.8 mg/dL      Calcium 9.3 mg/dL      Sodium 027 mEq/L      Potassium 4.0 mEq/L      Chloride 104 mEq/L      CO2 23 mEq/L      Anion Gap 10.0    GFR [[id number]] Collected: 07/15/20 2229     Updated: 07/16/20 0256     EGFR >60.0    CBC and differential [[id number]]  (Abnormal) Collected: 07/16/20 0138    Specimen: Blood Updated: 07/16/20 0253     WBC 9.45 x10 3/uL      Hgb 12.3 g/dL      Hematocrit 87.5 %      Platelets 140 x10 3/uL      RBC 3.92 x10 6/uL      MCV 92.3 fL      MCH 31.4 pg      MCHC 34.0 g/dL       RDW 13 %      MPV 11.2 fL      Neutrophils 58.4 %      Lymphocytes Automated 29.1 %      Monocytes 9.7 %      Eosinophils Automated 1.9 %      Basophils Automated 0.6 %      Immature Granulocytes 0.3 %      Nucleated RBC 0.0 /100 WBC      Neutrophils Absolute 5.51 x10 3/uL      Lymphocytes Absolute Automated 2.75 x10 3/uL  Monocytes Absolute Automated 0.92 x10 3/uL      Eosinophils Absolute Automated 0.18 x10 3/uL      Basophils Absolute Automated 0.06 x10 3/uL      Immature Granulocytes Absolute 0.03 x10 3/uL      Absolute NRBC 0.00 x10 3/uL     Troponin I [[id number]] Collected: 07/15/20 2229    Specimen: Blood Updated: 07/15/20 2315     Troponin I <0.01 ng/mL         Laboratory and imaging evaluation without significant abnormality           Disposition:         Pending at time of provider signout            Discharge Prescriptions     None                      CLINICAL INFORMATION        HPI:      Chief Complaint: Abdominal Pain and Chest Pain  .    [name] [name] is a 55 y.o. male who presents with concern for abdominal pain    Recently evaluated 07/14/2020 with concerns for severe chest pain.  Multiple negative troponins.  Vitals within normal limits.  EKG concern for left fascicular block, no significant ST elevation or T wave changes.  CTA negative for PE/dissection.  Admitted and evaluated by [facility].  Coronary CT scan with 50 to 69% stenosis of proximal LAD.  No inpatient LHC recommended at that time.    Hayzen was otherwise in his usual state of health until day of evaluation reports sudden onset epigastric pain.  Otherwise eating and drinking.  However, he reports that he has developed new onset left lower quadrant pain associated with blood in his urine.  Denies any other significant dysuria, urgency, hesitancy, frequency.  No other fevers, nausea, vomiting.  No change in stool pattern      ROS:      Positive and negative ROS elements as per HPI.  Other systems reviewed and negative.    Specifically  denies fevers, runny/stuffy nose, cough/wheeze/sneeze, throwing up/diarrhea, muscle pain/joint pain, rash.      Physical Exam:      Vitals:    07/16/20 0130 07/16/20 0300 07/16/20 0330 07/16/20 0400   BP: 136/84 156/83 129/73 144/79   Pulse: 63 (!) 46 (!) 58 (!) 52   Resp: (!) 24 16 14 13    Temp:       TempSrc:       SpO2: 100% 99% 98% 99%   Weight:       Height:           Physical Exam   Constitutional: well-developed, well-nourished, and in no distress.  Right Ear: External ear normal. Tympanic membrane without effusion/erythema  Left Ear: External ear normal. Tympanic membrane without effusion/erythema  Mouth/Throat: Oropharynx is clear and moist.   Eyes: Pupils are equal, round, and reactive to light.   Neck: Normal range of motion.   Cardiovascular: Normal rate and regular rhythm. No murmur heard.   Pulmonary/Chest: Effort normal and breath sounds normal. No wheeze or crackle appreciated  Abdominal: Soft.  Mild midepigastric tenderness to palpation, focal tenderness in the left lower quadrant  Musculoskeletal:    No deformity or edema. Cap refill <3s in all 4 extremities  Lymphadenopathy: No cervical adenopathy.   Neurological: Alert. Moving all 4 extremities equally   Skin: Skin is warm and dry. No rash noted.  PAST HISTORY        Primary Care Provider: [name], MD        PMH/PSH:    .     Past Medical History:   Diagnosis Date   . Echocardiogram 07/2015, 08/2011   . Gallstones    . Gastritis    . Heart murmur    . Heroin abuse    . Syphilis        He has no past surgical history on file.      Social/Family History:      Pediatric History   Patient Parents   . Greb,Charles (Father)     Other Topics Concern   . Not on file   Social History Narrative   . Not on file     Social History     Tobacco Use   . Smoking status: Light Tobacco Smoker     Packs/day: 0.25     Years: 10.00     Pack years: 2.50     Types: Cigarettes   . Smokeless tobacco: Never Used   Substance Use Topics   .  Alcohol use: Not Currently       Family History   Problem Relation Age of Onset   . Diabetes Mother    . Hypertension Mother    . Other Father         Unknown disease         Listed Medications on Arrival:    .     Home Medications     Med List Status: In Progress Set By: Mayer Masker, RN at 07/16/2020  1:45 AM                alum & mag hydroxide-simethicone (MAALOX PLUS) 200-200-20 mg/5 mL suspension     Use as directed, as needed for acid reflux/heartburn. Available over the counter.     aspirin EC 81 MG EC tablet     Take 1 tablet (81 mg total) by mouth daily     atorvastatin (LIPITOR) 40 MG tablet     Take 1 tablet (40 mg total) by mouth nightly          Allergies: He is allergic to nsaids, percocet [oxycodone-acetaminophen], and tylenol [acetaminophen].            VISIT INFORMATION        Clinical Course in the ED:        See documentation in MDM as above           Medications Given in the ED:    .     ED Medication Orders (From admission, onward)    Start Ordered     Status Ordering Provider    07/16/20 0153 07/16/20 0152  sodium chloride 0.9 % bolus 1,000 mL  Once        Route: Intravenous  Ordered Dose: 1,000 mL     Last MAR action: Stopped Benny Lennert    07/16/20 0153 07/16/20 0152  morphine injection 4 mg  Once        Route: Intravenous  Ordered Dose: 4 mg     Last MAR action: Given Jodee Wagenaar J    07/16/20 0153 07/16/20 0152  ondansetron (ZOFRAN) injection 4 mg  Once        Route: Intravenous  Ordered Dose: 4 mg     Last MAR action: Given Catheryne Deford J            Procedures:  Procedures      Interpretations:                RESULTS        Lab Results:      Results     Procedure Component Value Units Date/Time    Urinalysis Reflex to Microscopic Exam [[id number]]  (Abnormal) Collected: 07/16/20 0503    Specimen: Urine Updated: 07/16/20 0533     Urine Type Clean Catch     Color, UA Yellow     Clarity, UA Clear     Specific Gravity UA 1.020     Urine pH 6.0      Leukocyte Esterase, UA Negative     Nitrite, UA Negative     Protein, UR Negative     Glucose, UA Negative     Ketones UA 20     Urobilinogen, UA Normal mg/dL      Bilirubin, UA Negative     Blood, UA Negative    Lipase [[id number]] Collected: 07/15/20 2229     Updated: 07/16/20 0256     Lipase 19 U/L     Basic Metabolic Panel [[id number]] Collected: 07/15/20 2229    Specimen: Blood Updated: 07/16/20 0256     Glucose 81 mg/dL      BUN 08.6 mg/dL      Creatinine 0.8 mg/dL      Calcium 9.3 mg/dL      Sodium 578 mEq/L      Potassium 4.0 mEq/L      Chloride 104 mEq/L      CO2 23 mEq/L      Anion Gap 10.0    GFR [[id number]] Collected: 07/15/20 2229     Updated: 07/16/20 0256     EGFR >60.0    CBC and differential [[id number]]  (Abnormal) Collected: 07/16/20 0138    Specimen: Blood Updated: 07/16/20 0253     WBC 9.45 x10 3/uL      Hgb 12.3 g/dL      Hematocrit 27.2 %      Platelets 140 x10 3/uL      RBC 3.92 x10 6/uL      MCV 92.3 fL      MCH 31.4 pg      MCHC 34.0 g/dL      RDW 13 %      MPV 11.2 fL      Neutrophils 58.4 %      Lymphocytes Automated 29.1 %      Monocytes 9.7 %      Eosinophils Automated 1.9 %      Basophils Automated 0.6 %      Immature Granulocytes 0.3 %      Nucleated RBC 0.0 /100 WBC      Neutrophils Absolute 5.51 x10 3/uL      Lymphocytes Absolute Automated 2.75 x10 3/uL      Monocytes Absolute Automated 0.92 x10 3/uL      Eosinophils Absolute Automated 0.18 x10 3/uL      Basophils Absolute Automated 0.06 x10 3/uL      Immature Granulocytes Absolute 0.03 x10 3/uL      Absolute NRBC 0.00 x10 3/uL     Troponin I [[id number]] Collected: 07/15/20 2229    Specimen: Blood Updated: 07/15/20 2315     Troponin I <0.01 ng/mL               Radiology Results:      CT Abd/ Pelvis without Contrast   Final Result      1.  No acute abnormality.   2.  Left nephrolithiasis.      Demetrios Isaacs, MD    07/16/2020 3:04 AM

## 2020-08-29 ENCOUNTER — Emergency Department: Payer: 59

## 2020-08-29 DIAGNOSIS — M79605 Pain in left leg: Secondary | ICD-10-CM | POA: Insufficient documentation

## 2020-08-29 DIAGNOSIS — M7989 Other specified soft tissue disorders: Secondary | ICD-10-CM | POA: Insufficient documentation

## 2020-08-29 DIAGNOSIS — R079 Chest pain, unspecified: Secondary | ICD-10-CM | POA: Insufficient documentation

## 2020-08-29 DIAGNOSIS — F17210 Nicotine dependence, cigarettes, uncomplicated: Secondary | ICD-10-CM | POA: Insufficient documentation

## 2020-08-29 LAB — ECG 12-LEAD
Atrial Rate: 79 {beats}/min
P-R Interval: 156 ms
QRS Duration: 82 ms
T Axis: 4 degrees

## 2020-08-29 LAB — BASIC METABOLIC PANEL
Anion Gap: 8 (ref 5.0–15.0)
BUN: 10 mg/dL (ref 9.0–28.0)
CO2: 25 mEq/L (ref 22–29)
Calcium: 9 mg/dL (ref 8.5–10.5)
Chloride: 105 mEq/L (ref 100–111)
Creatinine: 0.8 mg/dL (ref 0.7–1.3)
Glucose: 90 mg/dL (ref 70–100)
Potassium: 4.4 mEq/L (ref 3.5–5.1)
Sodium: 138 mEq/L (ref 136–145)

## 2020-08-29 LAB — LACTIC ACID, PLASMA: Lactic Acid: 1.2 mmol/L (ref 0.2–2.0)

## 2020-08-29 LAB — B-TYPE NATRIURETIC PEPTIDE: B-Natriuretic Peptide: <10 pg/mL (ref 0–100)

## 2020-08-29 LAB — TROPONIN I: Troponin I: <0.01 ng/mL (ref 0.00–0.05)

## 2020-08-29 LAB — GFR: EGFR: >60

## 2020-08-29 NOTE — EDIE (Signed)
COLLECTIVE?NOTIFICATION?08/29/2020 21:30?DEMERIUS, PODOLAK R?MRN: [id number]    Criteria Met      5 ED Visits in 12 Months    Security and Safety  No Security Events were found.  ED Care Guidelines  There are currently no ED Care Guidelines for this patient. Please check your facility's medical records system.        Prescription Monitoring Program  000??- Narcotic Use Score  000??- Sedative Use Score  000??- Stimulant Use Score  000??- Overdose Risk Score  - All Scores range from 000-999 with 75% of the population scoring < 200 and on 1% scoring above 650  - The last digit of the narcotic, sedative, and stimulant score indicates the number of active prescriptions of that type  - Higher Use scores correlate with increased prescribers, pharmacies, mg equiv, and overlapping prescriptions  - Higher Overdose Risk Scores correlate with increased risk of unintentional overdose death   Concerning or unexpectedly high scores should prompt a review of the PMP record; this does not constitute checking PMP for prescribing purposes.    E.D. Visit Count (12 mo.)  Facility Visits   [facility] [place] 1   [facility] 6   Total 7   Note: Visits indicate total known visits.     Recent Emergency Department Visit Summary  Date Facility [facility] Type Diagnoses or Chief Complaint    Aug 29, 2020  [facility]  [place]  Emergency      Triage      Jul 16, 2020  [facility] [facility]  [place]  [place]  Emergency      Triage      Chest Pain      Abdominal Pain      Lower abdominal pain, unspecified      Jul 13, 2020  [facility] [facility]  [place]  [place]  Emergency      Triage      Nausea      Chest Pain      Headache      Chest pain, unspecified      Jun 18, 2020  [facility] [place] [facility]  [place]  Emergency      Triage      Triage - CP      Emesis      Chest Pain      Nausea      Chest pain, unspecified      Jun 07, 2020  [facility]  [place]  [place]  Emergency      Triage      Otalgia      Nasal  congestion      Other chronic sinusitis      Jun 01, 2020  [facility] [place] [facility]  [place]  Emergency      Medic: CP #2      Chest Pain      Other chest pain      Unspecified disorder of tympanic membrane, left ear      Apr 14, 2020  [facility] [place]  [name]  [place]  Emergency      Chest Pain and Leg Pain      Leg Pain      Chest Pain      Otitis media, unspecified, unspecified ear      Pain in left leg      Chest pain, unspecified  Recent Inpatient Visit Summary  No Recent Inpatient Visits were found.  Care Team  Provider Specialty Phone Fax Service Dates   [name] , MD Internal Medicine   Current      Collective Portal  This patient has registered at the [facility] Emergency Department   For more information visit: [email or web address]     PLEASE NOTE:     1.   Any care recommendations and other clinical information are provided as guidelines or for historical purposes only, and providers should exercise their own clinical judgment when providing care.    2.   You may only use this information for purposes of treatment, payment or health care operations activities, and subject to the limitations of applicable Collective Policies.    3.   You should consult directly with the organization that provided a care guideline or other clinical history with any questions about additional information or accuracy or completeness of information provided.    ? 2022 [place] - [email or web address]

## 2020-08-30 ENCOUNTER — Emergency Department
Admission: EM | Admit: 2020-08-30 | Discharge: 2020-08-30 | Disposition: A | Discharge status: Home / Self Care | Payer: 59 | Attending: Emergency Medicine | Admitting: Emergency Medicine

## 2020-08-30 DIAGNOSIS — M79605 Pain in left leg: Secondary | ICD-10-CM

## 2020-08-30 DIAGNOSIS — R079 Chest pain, unspecified: Secondary | ICD-10-CM

## 2020-08-30 LAB — CBC AND DIFFERENTIAL
Absolute NRBC: 0 10*3/uL (ref 0.00–0.00)
Basophils Absolute Automated: 0.05 10*3/uL (ref 0.00–0.08)
Basophils Automated: 0.6 %
Eosinophils Absolute Automated: 0.26 10*3/uL (ref 0.00–0.44)
Eosinophils Automated: 3.2 %
Hematocrit: 34.5 % — ABNORMAL LOW (ref 37.6–49.6)
Hgb: 12 g/dL — ABNORMAL LOW (ref 12.5–17.1)
Immature Granulocytes Absolute: 0.02 10*3/uL (ref 0.00–0.07)
Immature Granulocytes: 0.2 %
Lymphocytes Absolute Automated: 3.31 10*3/uL — ABNORMAL HIGH (ref 0.42–3.22)
Lymphocytes Automated: 41 %
MCH: 31.7 pg (ref 25.1–33.5)
MCHC: 34.8 g/dL (ref 31.5–35.8)
MCV: 91.3 fL (ref 78.0–96.0)
MPV: 11 fL (ref 8.9–12.5)
Monocytes Absolute Automated: 0.75 10*3/uL (ref 0.21–0.85)
Monocytes: 9.3 %
Neutrophils Absolute: 3.68 10*3/uL (ref 1.10–6.33)
Neutrophils: 45.7 %
Nucleated RBC: 0 /100 WBC (ref 0.0–0.0)
Platelets: 144 10*3/uL (ref 142–346)
RBC: 3.78 10*6/uL — ABNORMAL LOW (ref 4.20–5.90)
RDW: 13 % (ref 11–15)
WBC: 8.07 10*3/uL (ref 3.10–9.50)

## 2020-08-30 LAB — ECG 12-LEAD
IHS MUSE NARRATIVE AND IMPRESSION: NORMAL
P Axis: 57 degrees
Q-T Interval: 362 ms
QTC Calculation (Bezet): 415 ms
R Axis: -41 degrees
Ventricular Rate: 79 {beats}/min

## 2020-08-30 MED ORDER — ALUM & MAG HYDROXIDE-SIMETH 200-200-20 MG/5ML PO SUSP
30.0000 mL | Freq: Once | ORAL | Status: DC
Start: 2020-08-30 — End: 2020-08-30
  Filled 2020-08-30: qty 30

## 2020-08-30 MED ORDER — OXYCODONE-ACETAMINOPHEN 5-325 MG PO TABS
1.0000 | ORAL_TABLET | Freq: Once | ORAL | Status: DC
Start: 2020-08-30 — End: 2020-08-30

## 2020-08-30 MED ORDER — LIDOCAINE VISCOUS HCL 2 % MT SOLN
10.0000 mL | Freq: Once | OROMUCOSAL | Status: DC
Start: 2020-08-30 — End: 2020-08-30
  Filled 2020-08-30: qty 15

## 2020-08-30 MED ORDER — SUCRALFATE 1 G PO TABS
1.0000 g | ORAL_TABLET | Freq: Once | ORAL | Status: AC
Start: 2020-08-30 — End: 2020-08-30
  Administered 2020-08-30: 1 g via ORAL
  Filled 2020-08-30: qty 1

## 2020-08-30 MED ORDER — OXYCODONE HCL 5 MG PO TABS
5.0000 mg | ORAL_TABLET | ORAL | 0 refills | Status: AC | PRN
Start: 2020-08-30 — End: 2020-09-06

## 2020-08-30 MED ORDER — FAMOTIDINE 20 MG PO TABS
20.0000 mg | ORAL_TABLET | Freq: Once | ORAL | Status: AC
Start: 2020-08-30 — End: 2020-08-30
  Administered 2020-08-30: 20 mg via ORAL
  Filled 2020-08-30: qty 1

## 2020-08-30 MED ORDER — FAMOTIDINE 20 MG PO TABS
20.0000 mg | ORAL_TABLET | Freq: Two times a day (BID) | ORAL | 0 refills | Status: AC
Start: 2020-08-30 — End: 2020-09-06

## 2020-08-30 NOTE — ED Triage Notes (Signed)
Pt ambualtory to traige with steady gait, endorsing pain to entire left lower leg & +chest pain beginning x1 hour PTA, has not taken medication for pain, endorsing slight shortness of breathing, +swelling noted to left, endorsing numbness and tingling, A/Ox4 speaking in full clear sentences     Non-pitting edema noted to lower left leg compared to Right leg -- pt reports this has been ongoing for several months, not new or worse. Denies hx of blood clots/ cardiac hx. Not taking blood thinners. Good bilateral distal LE PMS

## 2020-08-30 NOTE — ED Provider Notes (Signed)
[name] EMERGENCY DEPARTMENT H&P      Visit date: 08/30/2020      CLINICAL SUMMARY           Diagnosis:    .     Final diagnoses:   Chest pain at rest   Leg pain, anterior, left         MDM Notes:      Patient presenting with chest pain, recent extensive cardiothoracic work-up including coronary CT as well as CT angio.  Low clinical suspicion for ACS or PE.  Acute on chronic leg pain, with chronic swelling.  No acute DVT.  Advised trial of Pepcid for recurrent chest pain, given very strict return precautions.  Stable for outpatient follow-up for further work-up management.       Disposition:      Discharge         Discharge Prescriptions       Medication Sig Dispense Auth. Provider    oxyCODONE (ROXICODONE) 5 MG immediate release tablet Take 1-2 tablets (5-10 mg total) by mouth every 4 (four) hours as needed for Pain 8 tablet Phillis Knack, MD    famotidine (PEPCID) 20 MG tablet Take 1 tablet (20 mg total) by mouth 2 (two) times daily for 7 days 14 tablet Phillis Knack, MD                        CLINICAL INFORMATION        HPI:      Chief Complaint: Chest Pain and Leg Pain  .    [name] [name] is a 55 y.o. male with PMHx of gallstones, gastritis, and heroin abuse (sober for 1 year) who presents with moderate intermittent LLE pain a/w CP. Says left leg pain is recurring at the end of his shifts when he has been standing for a long time. 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 Denies any recent hospitalizations. States he recently had a surgery on his left leg for his varicose veins. Reports CP exacerbated by deep inspiration. Says he has tried antacids to treat CP without any alleviation.     Review of prior chart reveals that he has had numerous [place] on his left leg. Also recently had a coronary CT and CTA to r/o dissection of chest and abdomen last month.     History obtained from: Patient, review of prior chart          ROS:      Positive and negative ROS elements as per HPI.  All other systems  reviewed and negative.      Physical Exam:      Pulse (!) 107  BP 124/76  Resp 15  SpO2 97 %  Temp 99.2 F (37.3 C)    Constitutional: Well appearing. No acute distress.  Head: Normocephalic, atraumatic.  HEENT: PERRL. Neck supple.    CV: HR regular. Extremities symmetric, no edema.   Respiratory: CTAB. No respiratory distress. Speaking full sentences. Anterior chest wall pain  Abdomen: Soft, non-distended. Epigastric ttp  Skin: Dry. Normal temp.   Neuro: Sensation grossly intact. Face symmetric. Clear speech. Moves all extremities equally.  Psych: Normal affect and thought. Oriented x3                 PAST HISTORY        Primary Care Provider: [name], MD        PMH/PSH:    .     Past Medical History:  Diagnosis Date    Echocardiogram 07/2015, 08/2011    Gallstones     Gastritis     Heart murmur     Heroin abuse     Syphilis        He has no past surgical history on file.      Social/Family History:      He reports that he has been smoking cigarettes. He has a 2.50 pack-year smoking history. He has never used smokeless tobacco. He reports that he does not currently use alcohol. He reports that he does not currently use drugs after having used the following drugs: Marijuana and Cocaine.    Family History   Problem Relation Age of Onset    Diabetes Mother     Hypertension Mother     Other Father         Unknown disease         Listed Medications on Arrival:    .     Home Medications       Med List Status: In Progress Set By: Raenette Rover, RN at 08/30/2020 12:35 AM              alum & mag hydroxide-simethicone (MAALOX PLUS) 200-200-20 mg/5 mL suspension     Use as directed, as needed for acid reflux/heartburn. Available over the counter.     aspirin EC 81 MG EC tablet     Take 1 tablet (81 mg total) by mouth daily     atorvastatin (LIPITOR) 40 MG tablet     Take 1 tablet (40 mg total) by mouth nightly           Allergies: He is allergic to nsaids, percocet [oxycodone-acetaminophen], and  tylenol [acetaminophen].            VISIT INFORMATION        Clinical Course in the ED:                 Medications Given in the ED:    .     ED Medication Orders (From admission, onward)      Start Ordered     Status Ordering Provider    08/30/20 0111 08/30/20 0110  lidocaine viscous (XYLOCAINE) 2 % solution 10 mL  Once        Route: Mouth/Throat  Ordered Dose: 10 mL     Last MAR action: Not Given Yentl Verge J    08/30/20 0111 08/30/20 0110  alum & mag hydroxide-simethicone (MAALOX PLUS) 200-200-20 mg/5 mL suspension 30 mL  Once        Route: Oral  Ordered Dose: 30 mL     Last MAR action: Not Given Liliane Mallis J    08/30/20 0111 08/30/20 0110  famotidine (PEPCID) tablet 20 mg  Once        Route: Oral  Ordered Dose: 20 mg     Last MAR action: Given Kiano Terrien J    08/30/20 0111 08/30/20 0110  sucralfate (CARAFATE) tablet 1 g  Once        Route: Oral  Ordered Dose: 1 g     Last MAR action: Given Dyanna Seiter J    08/30/20 0059 08/30/20 0058    Once        Route: Oral  Ordered Dose: 1 tablet     Discontinued Siyon Linck J              Procedures:      Procedures  Interpretations:      O2 sat-           saturation: 97 %; Oxygen use: room air; Interpretation: Normal     EKG -             interpreted by me: normal sinus at 79 BPM, left axis deviation.                     RESULTS        Lab Results:      Results       Procedure Component Value Units Date/Time    CBC and differential [[id number]]  (Abnormal) Collected: 08/29/20 2222    Specimen: Blood Updated: 08/30/20 0029     WBC 8.07 x10 3/uL      Hgb 12.0 g/dL      Hematocrit 40.9 %      Platelets 144 x10 3/uL      RBC 3.78 x10 6/uL      MCV 91.3 fL      MCH 31.7 pg      MCHC 34.8 g/dL      RDW 13 %      MPV 11.0 fL      Neutrophils 45.7 %      Lymphocytes Automated 41.0 %      Monocytes 9.3 %      Eosinophils Automated 3.2 %      Basophils Automated 0.6 %      Immature Granulocytes 0.2 %      Nucleated RBC 0.0 /100 WBC      Neutrophils Absolute 3.68 x10  3/uL      Lymphocytes Absolute Automated 3.31 x10 3/uL      Monocytes Absolute Automated 0.75 x10 3/uL      Eosinophils Absolute Automated 0.26 x10 3/uL      Basophils Absolute Automated 0.05 x10 3/uL      Immature Granulocytes Absolute 0.02 x10 3/uL      Absolute NRBC 0.00 x10 3/uL     B-type Natriuretic Peptide [[id number]] Collected: 08/29/20 2222    Specimen: Blood Updated: 08/29/20 2356     B-Natriuretic Peptide <10 pg/mL     Troponin I [[id number]] Collected: 08/29/20 2222    Specimen: Blood Updated: 08/29/20 2255     Troponin I <0.01 ng/mL     Basic Metabolic Panel [[id number]] Collected: 08/29/20 2222    Specimen: Blood Updated: 08/29/20 2249     Glucose 90 mg/dL      BUN 52.8 mg/dL      Creatinine 0.8 mg/dL      Calcium 9.0 mg/dL      Sodium 413 mEq/L      Potassium 4.4 mEq/L      Chloride 105 mEq/L      CO2 25 mEq/L      Anion Gap 8.0    GFR [[id number]] Collected: 08/29/20 2222     Updated: 08/29/20 2249     EGFR >60.0    Lactic Acid [[id number]] Collected: 08/29/20 2222    Specimen: Blood Updated: 08/29/20 2239     Lactic Acid 1.2 mmol/L                 Radiology Results:      [place] VenoDopp Low Extremity Left   Final Result       No deep venous thrombosis of the left lower extremity.      [facility]    08/30/2020 12:09 AM      XR Chest  AP Portable   Final Result      No acute cardiopulmonary disease.      Collene Schlichter, MD    08/29/2020 10:17 PM                  Scribe Attestation:      I was acting as a Neurosurgeon for Phillis Knack, MD on [facility] R   Treatment Team: Scribe: Lequita Halt     I am the first provider for this patient and I personally performed the services documented. Treatment Team: Scribe: Lequita Halt is scribing for me on [facility] R .This note and the patient instructions accurately reflect work and decisions made by me.  Phillis Knack, MD            Phillis Knack, MD  09/07/20 [phone]

## 2020-10-24 ENCOUNTER — Emergency Department
Admission: EM | Admit: 2020-10-24 | Discharge: 2020-10-24 | Disposition: A | Discharge status: Home / Self Care | Payer: 59 | Attending: Emergency Medicine | Admitting: Emergency Medicine

## 2020-10-24 ENCOUNTER — Emergency Department: Payer: 59

## 2020-10-24 DIAGNOSIS — R079 Chest pain, unspecified: Secondary | ICD-10-CM | POA: Insufficient documentation

## 2020-10-24 DIAGNOSIS — I872 Venous insufficiency (chronic) (peripheral): Secondary | ICD-10-CM | POA: Insufficient documentation

## 2020-10-24 DIAGNOSIS — M7989 Other specified soft tissue disorders: Secondary | ICD-10-CM | POA: Insufficient documentation

## 2020-10-24 LAB — CBC AND DIFFERENTIAL
Absolute NRBC: 0 10*3/uL (ref 0.00–0.00)
Basophils Absolute Automated: 0.06 10*3/uL (ref 0.00–0.08)
Basophils Automated: 0.7 %
Eosinophils Absolute Automated: 0.28 10*3/uL (ref 0.00–0.44)
Eosinophils Automated: 3.1 %
Hematocrit: 36 % — ABNORMAL LOW (ref 37.6–49.6)
Hgb: 12.3 g/dL — ABNORMAL LOW (ref 12.5–17.1)
Immature Granulocytes Absolute: 0.02 10*3/uL (ref 0.00–0.07)
Immature Granulocytes: 0.2 %
Lymphocytes Absolute Automated: 3.09 10*3/uL (ref 0.42–3.22)
Lymphocytes Automated: 34 %
MCH: 31.4 pg (ref 25.1–33.5)
MCHC: 34.2 g/dL (ref 31.5–35.8)
MCV: 91.8 fL (ref 78.0–96.0)
MPV: 11.3 fL (ref 8.9–12.5)
Monocytes Absolute Automated: 0.81 10*3/uL (ref 0.21–0.85)
Monocytes: 8.9 %
Neutrophils Absolute: 4.82 10*3/uL (ref 1.10–6.33)
Neutrophils: 53.1 %
Nucleated RBC: 0 /100 WBC (ref 0.0–0.0)
Platelets: 154 10*3/uL (ref 142–346)
RBC: 3.92 10*6/uL — ABNORMAL LOW (ref 4.20–5.90)
RDW: 13 % (ref 11–15)
WBC: 9.08 10*3/uL (ref 3.10–9.50)

## 2020-10-24 LAB — BASIC METABOLIC PANEL
Anion Gap: 7 (ref 5.0–15.0)
BUN: 11 mg/dL (ref 9.0–28.0)
CO2: 27 mEq/L (ref 22–29)
Calcium: 9.2 mg/dL (ref 8.5–10.5)
Chloride: 104 mEq/L (ref 100–111)
Creatinine: 0.9 mg/dL (ref 0.7–1.3)
Glucose: 100 mg/dL (ref 70–100)
Potassium: 4 mEq/L (ref 3.5–5.1)
Sodium: 138 mEq/L (ref 136–145)

## 2020-10-24 LAB — TROPONIN I: Troponin I: <0.01 ng/mL (ref 0.00–0.05)

## 2020-10-24 LAB — PT/INR
PT INR: 1.1 (ref 0.9–1.1)
PT: 12.5 s (ref 10.1–12.9)

## 2020-10-24 LAB — LACTIC ACID, PLASMA: Lactic Acid: 0.9 mmol/L (ref 0.2–2.0)

## 2020-10-24 LAB — GFR: EGFR: >60

## 2020-10-24 NOTE — Discharge Instructions (Signed)
Dear Mr. Calender:    Thank you for choosing the [facility] Emergency Department, the premier emergency department in the [place] area.  I hope your visit today was EXCELLENT. You will receive a survey via text message that will give you the opportunity to provide feedback to your team about your visit. Please do not hesitate to reach out with any questions!    Specific instructions for your visit today:      IF YOU DO NOT CONTINUE TO IMPROVE OR YOUR CONDITION WORSENS, PLEASE CONTACT YOUR DOCTOR OR RETURN IMMEDIATELY TO THE EMERGENCY DEPARTMENT.    Sincerely,  Chopard, Alben Spittle, MD  Attending Emergency Physician  [facility] Emergency Department      OBTAINING A PRIMARY CARE APPOINTMENT    Primary care physicians (PCPs, also known as primary care doctors) are either internists or family medicine doctors. Both types of PCPs focus on health promotion, disease prevention, patient education and counseling, and treatment of acute and chronic medical conditions.    If you need a primary care doctor, please call the below number and ask who is receiving new patients.     [facility]  Telephone:  [phone]  [email or web address]    DOCTOR REFERRALS  Call [phone] (available 24 hours a day, 7 days a week) if you need any further referrals and we can help you find a primary care doctor or specialist.  Also, available online at:  [email or web address]    YOUR CONTACT INFORMATION  Before leaving please check with registration to make sure we have an up-to-date contact number.  You can call registration at [phone] to update your information.  For questions about your hospital bill, please call [phone].  For questions about your Emergency Dept Physician bill please call [phone].      FREE HEALTH SERVICES  If you need help with health or social services, please call 2-1-1 for a free referral to resources in your area.  2-1-1 is a free service connecting people with  information on health insurance, free clinics, pregnancy, mental health, dental care, food assistance, housing, and substance abuse counseling.  Also, available online at:  http://www.[place].org    ORTHOPEDIC INJURY   Please know that significant injuries can exist even when an initial x-ray is read as normal or negative.  This can occur because some fractures (broken bones) are not initially visible on x-rays.  For this reason, close outpatient follow-up with your primary care doctor or bone specialist (orthopedist) is required.    MEDICATIONS AND FOLLOWUP  Please be aware that some prescription medications can cause drowsiness.  Use caution when driving or operating machinery.    The examination and treatment you have received in our Emergency Department is provided on an emergency basis, and is not intended to be a substitute for your primary care physician.  It is important that your doctor checks you again and that you report any new or remaining problems at that time.      ASSISTANCE WITH INSURANCE    Affordable Care Act  [facility])  Call to start or finish an application, compare plans, enroll or ask a question.  [phone]  TTY: [phone]  Web:  Healthcare.gov    Help Enrolling in [facility]  [employer] [place]  [phone] (TOLL-FREE)  [phone] (TTY)  Web:  Http://www.coverva.org    Local Help Enrolling in the [facility]  [place] [facility]  [phone] (MAIN)  Email:  health-help@nvfs .org  Web:  BlackjackMyths.is  Address:  [phone]) [phone]  [address] [place], [place] [address]    SEDATING MEDICATIONS  Sedating medications include strong pain medications (e.g. narcotics), muscle relaxers, benzodiazepines (used for anxiety and as muscle relaxers), Benadryl/diphenhydramine and other antihistamines for allergic reactions/itching, and other medications.  If you are unsure if you have received a sedating medication, please ask your physician or nurse.  If you received a sedating  medication: DO NOT drive a car. DO NOT operate machinery. DO NOT perform jobs where you need to be alert.  DO NOT drink alcoholic beverages while taking this medicine.     If you get dizzy, sit or lie down at the first signs. Be careful going up and down stairs.  Be extra careful to prevent falls.     Never give this medicine to others.     Keep this medicine out of reach of children.     Do not take or save old medicines. Throw them away when outdated.     Keep all medicines in a cool, dry place. DO NOT keep them in your bathroom medicine cabinet or in a cabinet above the stove.    MEDICATION REFILLS  Please be aware that we cannot refill any prescriptions through the ER. If you need further treatment from what is provided at your ER visit, please follow up with your primary care doctor or your pain management specialist.    [place]  Did you know [facility] has two freestanding ERs located just a few miles away?  [facility] [place] have short wait times, easy free parking directly in front of the building and top patient satisfaction scores - and the same Board Certified Emergency Medicine doctors as [facility].

## 2020-10-24 NOTE — ED Triage Notes (Signed)
PT endorses lower leg swelling and soreness, worse pain in L leg. Reports pain radiates up to thighs, pain started 7 hours ago. Edema in BLE noted, indention subsides slowly, +pulses. Tenderness noted upon palpation. Aox4, ambulatory, breathing even an unlabored.

## 2020-10-24 NOTE — EDIE (Signed)
COLLECTIVE?NOTIFICATION?10/24/2020 00:15?[name] [name], [name] [name]?MRN: [id number]    Criteria Met      5 ED Visits in 12 Months    Security and Safety  No Security Events were found.  ED Care Guidelines  There are currently no ED Care Guidelines for this patient. Please check your facility's medical records system.        Prescription Monitoring Program  080??- Narcotic Use Score  050??- Sedative Use Score  000??- Stimulant Use Score  200??- Overdose Risk Score  - All Scores range from 000-999 with 75% of the population scoring < 200 and on 1% scoring above 650  - The last digit of the narcotic, sedative, and stimulant score indicates the number of active prescriptions of that type  - Higher Use scores correlate with increased prescribers, pharmacies, mg equiv, and overlapping prescriptions  - Higher Overdose Risk Scores correlate with increased risk of unintentional overdose death   Concerning or unexpectedly high scores should prompt a review of the PMP record; this does not constitute checking PMP for prescribing purposes.    E.D. Visit Count (12 mo.)  Facility Visits   [place] [facility]: [facility] 1   [facility] 7   Total 8   Note: Visits indicate total known visits.     Recent Emergency Department Visit Summary  Date Facility [facility] Type Diagnoses or Chief Complaint    Oct 24, 2020  [facility]  [place]  Emergency      Triage      Aug 30, 2020  [facility]  [place]  Emergency      Triage      Leg Pain      Chest Pain      Chest pain, unspecified      Pain in left leg      Jul 16, 2020  [facility]  [place]  Emergency      Triage      Chest Pain      Abdominal Pain      Lower abdominal pain, unspecified      Jul 13, 2020  [facility]  [place]  Emergency      Triage      Nausea      Chest Pain      Headache      Chest pain, unspecified      Jun 18, 2020  [facility]  [place]  Emergency      Triage      Triage - CP      Emesis       Chest Pain      Nausea      Chest pain, unspecified      Jun 07, 2020  [place]  [place]  Emergency      Triage      Otalgia      Nasal congestion      Other chronic sinusitis      Jun 01, 2020  [place] [facility]  [place]    Emergency      Medic: CP #2      Chest Pain      Other chest pain      Unspecified disorder of tympanic membrane, left ear      Apr 14, 2020   Emergency Room: [facility]  [name]    Emergency  Chest Pain and Leg Pain      Leg Pain      Chest Pain      Otitis media, unspecified, unspecified ear      Pain in left leg      Chest pain, unspecified        Recent Inpatient Visit Summary  No Recent Inpatient Visits were found.  Care Team  Provider Specialty Phone Fax Service Dates   [name] , MD Internal Medicine   Current      Collective Portal  This patient has registered at the [facility] Emergency Department   For more information visit: [email or web address]     PLEASE NOTE:     1.   Any care recommendations and other clinical information are provided as guidelines or for historical purposes only, and providers should exercise their own clinical judgment when providing care.    2.   You may only use this information for purposes of treatment, payment or health care operations activities, and subject to the limitations of applicable Collective Policies.    3.   You should consult directly with the organization that provided a care guideline or other clinical history with any questions about additional information or accuracy or completeness of information provided.    ? 2022 [place], [employer]. - [email or web address]

## 2020-10-24 NOTE — ED Provider Notes (Shared)
[facility] EMERGENCY DEPARTMENT H&P      Visit date: 10/24/2020      CLINICAL SUMMARY          Diagnosis:    .     Final diagnoses:   None         MDM Notes:            Disposition:      {~~Select at end of visit~~:37410}               CLINICAL INFORMATION        HPI:      Chief Complaint: Leg Pain and Leg Swelling  .    [name] [name] is a 55 y.o. male who presents with ***     {~~Source of history ~~:29379::"History obtained from: Patient"}          ROS:      {~~ROS options~~:31012::"Positive and negative ROS elements as per HPI.  All other systems reviewed and negative."}      Physical Exam:      Pulse 77  BP 124/89  Resp 16  SpO2 98 %  Temp 97.8 F (36.6 C)    Physical Exam               PAST HISTORY        Primary Care Provider: [name], MD        PMH/PSH:    .     Past Medical History:   Diagnosis Date    Echocardiogram 07/2015, 08/2011    Gallstones     Gastritis     Heart murmur     Heroin abuse     Syphilis        He has no past surgical history on file.      Social/Family History:      He reports that he has been smoking cigarettes. He has a 2.50 pack-year smoking history. He has never used smokeless tobacco. He reports that he does not currently use alcohol. He reports that he does not currently use drugs after having used the following drugs: Marijuana and Cocaine.    Family History   Problem Relation Age of Onset    Diabetes Mother     Hypertension Mother     Other Father         Unknown disease         Listed Medications on Arrival:    .     Home Medications       Med List Status: In Progress Set By: Ponciano Ort, RN at 10/24/2020  2:54 AM              alum & mag hydroxide-simethicone (MAALOX PLUS) 200-200-20 mg/5 mL suspension     Use as directed, as needed for acid reflux/heartburn. Available over the counter.     aspirin EC 81 MG EC tablet     Take 1 tablet (81 mg total) by mouth daily     atorvastatin (LIPITOR) 40 MG tablet     Take 1 tablet (40 mg total) by mouth nightly            Allergies: He is allergic to nsaids, percocet [oxycodone-acetaminophen], and tylenol [acetaminophen].            VISIT INFORMATION        Clinical Course in the ED:            Medications Given in the ED:    .  ED Medication Orders (From admission, onward)      None              Procedures:      Procedures      Interpretations:      {~~Interpretations~~:28933}              RESULTS        Lab Results:      Results       ** No results found for the last 24 hours. **                Radiology Results:      US Venous Duplex Doppler Leg Left   Final Result       No deep venous thrombosis of the LEFT lower extremity.      Demetrios Isaacs, MD    10/24/2020 2:46 AM                  Scribe Attestation:      {~~Scribe or no?~~:29594}

## 2020-10-24 NOTE — ED Notes (Signed)
Pt asked this RN if she was married. Pt replied "engaged". Pt then said "how can I fix that, I don't think he can handle you, you're special." This RN gave pt call bell and left room.

## 2020-11-13 ENCOUNTER — Emergency Department: Payer: 59

## 2020-11-13 ENCOUNTER — Emergency Department
Admission: EM | Admit: 2020-11-13 | Discharge: 2020-11-13 | Disposition: A | Discharge status: Home / Self Care | Payer: 59 | Attending: Emergency Medicine | Admitting: Emergency Medicine

## 2020-11-13 DIAGNOSIS — F17210 Nicotine dependence, cigarettes, uncomplicated: Secondary | ICD-10-CM | POA: Insufficient documentation

## 2020-11-13 DIAGNOSIS — M79605 Pain in left leg: Secondary | ICD-10-CM | POA: Insufficient documentation

## 2020-11-13 DIAGNOSIS — M7989 Other specified soft tissue disorders: Secondary | ICD-10-CM | POA: Insufficient documentation

## 2020-11-13 NOTE — EDIE (Signed)
COLLECTIVE?NOTIFICATION?11/13/2020 08:53?[name] [name], [name] [name]?MRN: [id number]    Criteria Met      5 ED Visits in 12 Months    Security and Safety  No Security Events were found.  ED Care Guidelines  There are currently no ED Care Guidelines for this patient. Please check your facility's medical records system.        Prescription Monitoring Program  060??- Narcotic Use Score  030??- Sedative Use Score  000??- Stimulant Use Score  190??- Overdose Risk Score  - All Scores range from 000-999 with 75% of the population scoring < 200 and on 1% scoring above 650  - The last digit of the narcotic, sedative, and stimulant score indicates the number of active prescriptions of that type  - Higher Use scores correlate with increased prescribers, pharmacies, mg equiv, and overlapping prescriptions  - Higher Overdose Risk Scores correlate with increased risk of unintentional overdose death   Concerning or unexpectedly high scores should prompt a review of the PMP record; this does not constitute checking PMP for prescribing purposes.    E.D. Visit Count (12 mo.)  Facility Visits   [place] [facility] [place] 1   [facility] [place] 8   Total 9   Note: Visits indicate total known visits.     Recent Emergency Department Visit Summary  Date Facility [facility] Type Diagnoses or Chief Complaint    Nov 13, 2020  [place] [facility]  [place]  Emergency      triage      Oct 24, 2020  [place] [facility]  [place]  Emergency      Triage      Leg Swelling      Leg Pain      Venous insufficiency (chronic) (peripheral)      Chest pain, unspecified      Aug 30, 2020  [place] [facility] [place] [facility]  [place]  Emergency      Triage      Leg Pain      Chest Pain      Chest pain, unspecified      Pain in left leg      Jul 16, 2020  [place] [facility] [facility]  [place]  Emergency      Triage      Chest Pain      Abdominal Pain      Lower abdominal pain, unspecified      Jul 13, 2020  [place] [facility] [place] [facility]  [place]  Emergency       Triage      Nausea      Chest Pain      Headache      Chest pain, unspecified      Jun 18, 2020  [place] [facility] [facility]  [place]  Emergency      Triage      Triage - CP      Emesis      Chest Pain      Nausea      Chest pain, unspecified      Jun 07, 2020  [place] [facility] [facility]  [place]  Emergency      Triage      Otalgia      Nasal congestion      Other chronic sinusitis      Jun 01, 2020   [facility] [place] [facility]  [place]  Emergency  Medic: CP #2      Chest Pain      Other chest pain      Unspecified disorder of tympanic membrane, left ear      Apr 14, 2020  [place] [facility] [place]  [name]  [place]  Emergency      Chest Pain and Leg Pain      Leg Pain      Chest Pain      Otitis media, unspecified, unspecified ear      Pain in left leg      Chest pain, unspecified        Recent Inpatient Visit Summary  No Recent Inpatient Visits were found.  Care Team  Provider Specialty Phone Fax Service Dates   [name] , MD Internal Medicine   Current      Collective Portal  This patient has registered at the [facility] Emergency Department   For more information visit: [email or web address]     PLEASE NOTE:     1.   Any care recommendations and other clinical information are provided as guidelines or for historical purposes only, and providers should exercise their own clinical judgment when providing care.    2.   You may only use this information for purposes of treatment, payment or health care operations activities, and subject to the limitations of applicable Collective Policies.    3.   You should consult directly with the organization that provided a care guideline or other clinical history with any questions about additional information or accuracy or completeness of information provided.    ? 2022 [place] - [email or web address]

## 2020-11-13 NOTE — ED Provider Notes (Signed)
[name] EMERGENCY DEPARTMENT H&P      Visit date: 11/13/2020      CLINICAL SUMMARY           Diagnosis:    .     Final diagnoses:   None         MDM Notes:    Prior records reviewed  Patient states Hx of DVT  Given worsening symptoms, will repeat DVT US  Patient declined pain meds   Leg swelling and pain seems acute on chronic, without evidence of skin/soft tissue infection, thrombophlebitis; no hx or evidence of trauma  No DVT on [place]  Referred to [facility]  Work excuse note for light duty provided           Disposition:      Discharge         Discharge Prescriptions    None                     CLINICAL INFORMATION        HPI:      Chief Complaint: Leg Pain  .    [name] [name] is a 55 y.o. male who presents with chief complaint of left lower extremity swelling and pain.  The patient states that the pain started yesterday.  He notes that he was evaluated in the ED for similar pain in the same leg approximately 3 weeks ago.  He states that the swelling and pain had mostly resolved, but as of yesterday the symptoms rebounded.  He states that the pain and swelling are worse than 3 weeks ago.  He has not taken any medications for the pain.  He denies any recent surgeries, prolonged immobilization, injuries, wounds, chest pain, shortness of breath.    History obtained from: Patient          ROS:    Review of Systems   Cardiovascular:  Positive for leg swelling.   Musculoskeletal:  Negative for back pain, falls, joint pain, myalgias and neck pain.   All other systems reviewed and are negative.        Physical Exam:      Pulse 91   BP 131/59   Resp 16   SpO2 99 %   Temp 97.3 F (36.3 C)    Constitutional: Vital signs reviewed. Non-toxic appearing. No apparent distress  Head: Normocephalic, atraumatic  Eyes: No conjunctival injection. No discharge. No scleral icterus.  Respiratory/Chest: No respiratory distress.   Upper Extremity: No edema. No cyanosis. No gross deformities.  Lower  Extremity: Swelling to the left distal leg, predominantly at the calf and ankle; associated tenderness.  Mild erythema near ankle.  No wounds noted.  No increased warmth.  Neurological: No focal motor deficits by observation. Speech normal. Alert and oriented.  Skin: Warm and well-perfused, dry. No rash.  Psychiatric: Normal affect. Normal concentration.            PAST HISTORY        Primary Care Provider: [name], MD        PMH/PSH:    .     Past Medical History:   Diagnosis Date    Echocardiogram 07/2015, 08/2011    Gallstones     Gastritis     Heart murmur     Heroin abuse     Syphilis        He has no past surgical history on file.      Social/Family History:  He reports that he has been smoking cigarettes. He has a 2.50 pack-year smoking history. He has never used smokeless tobacco. He reports that he does not currently use alcohol. He reports that he does not currently use drugs after having used the following drugs: Marijuana and Cocaine.    Family History   Problem Relation Age of Onset    Diabetes Mother     Hypertension Mother     Other Father         Unknown disease         Listed Medications on Arrival:    .     Home Medications               alum & mag hydroxide-simethicone (MAALOX PLUS) 200-200-20 mg/5 mL suspension     Use as directed, as needed for acid reflux/heartburn. Available over the counter.     aspirin EC 81 MG EC tablet     Take 1 tablet (81 mg total) by mouth daily     atorvastatin (LIPITOR) 40 MG tablet     Take 1 tablet (40 mg total) by mouth nightly           Allergies: He is allergic to nsaids, percocet [oxycodone-acetaminophen], and tylenol [acetaminophen].            VISIT INFORMATION        Clinical Course in the ED:                 Medications Given in the ED:    .     ED Medication Orders (From admission, onward)      None              Procedures:      Procedures      Interpretations:                     RESULTS        Lab Results:      Results       ** No  results found for the last 24 hours. **                Radiology Results:      No orders to display               Scribe Attestation:      No scribe involved in the care of this patient          Britt Boozer, MD  11/13/20 1221

## 2020-11-13 NOTE — Discharge Instructions (Signed)
Dear Mr. Solivan:    Thank you for choosing the [facility] Emergency Department, the premier emergency department in the [place] area.  I hope your visit today was EXCELLENT. You will receive a survey via text message that will give you the opportunity to provide feedback to your team about your visit. Please do not hesitate to reach out with any questions!    Specific instructions for your visit today:    Take 1000 mg of acetaminophen every 6 hours, as needed. Do not take more than 4000 mg of acetaminophen within a 24 hour period because it can damage your liver.   Take 600 mg of ibuprofen every 6 hours, as needed. Be sure to take with food because it can irritate your stomach.     IF YOU DO NOT CONTINUE TO IMPROVE OR YOUR CONDITION WORSENS, PLEASE CONTACT YOUR DOCTOR OR RETURN IMMEDIATELY TO THE EMERGENCY DEPARTMENT.    Sincerely,  Dorien Mayotte, Reita Chard, MD  Attending Emergency Physician  [facility] Emergency Department      OBTAINING A PRIMARY CARE APPOINTMENT    Primary care physicians (PCPs, also known as primary care doctors) are either internists or family medicine doctors. Both types of PCPs focus on health promotion, disease prevention, patient education and counseling, and treatment of acute and chronic medical conditions.    If you need a primary care doctor, please call the below number and ask who is receiving new patients.     [facility]  Telephone:  [phone]  [email or web address]    DOCTOR REFERRALS  Call [phone] (available 24 hours a day, 7 days a week) if you need any further referrals and we can help you find a primary care doctor or specialist.  Also, available online at:  [email or web address]    YOUR CONTACT INFORMATION  Before leaving please check with registration to make sure we have an up-to-date contact number.  You can call registration at [phone] to update your information.  For questions about your hospital bill, please call  [phone].  For questions about your Emergency Dept Physician bill please call [phone].      FREE HEALTH SERVICES  If you need help with health or social services, please call 2-1-1 for a free referral to resources in your area.  2-1-1 is a free service connecting people with information on health insurance, free clinics, pregnancy, mental health, dental care, food assistance, housing, and substance abuse counseling.  Also, available online at:  http://www.211[place].org    ORTHOPEDIC INJURY   Please know that significant injuries can exist even when an initial x-ray is read as normal or negative.  This can occur because some fractures (broken bones) are not initially visible on x-rays.  For this reason, close outpatient follow-up with your primary care doctor or bone specialist (orthopedist) is required.    MEDICATIONS AND FOLLOWUP  Please be aware that some prescription medications can cause drowsiness.  Use caution when driving or operating machinery.    The examination and treatment you have received in our Emergency Department is provided on an emergency basis, and is not intended to be a substitute for your primary care physician.  It is important that your doctor checks you again and that you report any new or remaining problems at that time.      ASSISTANCE WITH INSURANCE    Affordable Care Act  [facility])  Call to start or finish an application, compare plans, enroll or ask a question.  [phone]  TTY: 5798871787  Web:  Healthcare.gov    Help Enrolling in [place]  (847)424-8624 (TOLL-FREE)  952-544-7777 (TTY)  Web:  Http://www.coverva.org    Local Help Enrolling in the Mobile  (518)650-6794 (MAIN)  Email:  health-help@nvfs$ .org  Web:  http://lewis-perez.info/  Address:  [address], [id number] [place], [place] [address]    SEDATING MEDICATIONS  Sedating medications include strong pain medications (e.g. narcotics), muscle relaxers,  benzodiazepines (used for anxiety and as muscle relaxers), Benadryl/diphenhydramine and other antihistamines for allergic reactions/itching, and other medications.  If you are unsure if you have received a sedating medication, please ask your physician or nurse.  If you received a sedating medication: DO NOT drive a car. DO NOT operate machinery. DO NOT perform jobs where you need to be alert.  DO NOT drink alcoholic beverages while taking this medicine.     If you get dizzy, sit or lie down at the first signs. Be careful going up and down stairs.  Be extra careful to prevent falls.     Never give this medicine to others.     Keep this medicine out of reach of children.     Do not take or save old medicines. Throw them away when outdated.     Keep all medicines in a cool, dry place. DO NOT keep them in your bathroom medicine cabinet or in a cabinet above the stove.    MEDICATION REFILLS  Please be aware that we cannot refill any prescriptions through the ER. If you need further treatment from what is provided at your ER visit, please follow up with your primary care doctor or your pain management specialist.    [place]  Did you know [facility] has two freestanding ERs located just a few miles away?  [facility] [place] [facility] [place] have short wait times, easy free parking directly in front of the building and top patient satisfaction scores - and the same Board Certified Emergency Medicine doctors as [facility].

## 2021-03-14 ENCOUNTER — Ambulatory Visit (INDEPENDENT_AMBULATORY_CARE_PROVIDER_SITE_OTHER): Payer: 59 | Admitting: Internal Medicine

## 2021-03-14 ENCOUNTER — Encounter (INDEPENDENT_AMBULATORY_CARE_PROVIDER_SITE_OTHER): Payer: Self-pay | Admitting: Internal Medicine

## 2021-03-14 VITALS — BP 132/79 | HR 92 | Temp 97.9°F | Ht 68.5 in | Wt 172.0 lb

## 2021-03-14 DIAGNOSIS — N5203 Combined arterial insufficiency and corporo-venous occlusive erectile dysfunction: Secondary | ICD-10-CM

## 2021-03-14 DIAGNOSIS — I272 Pulmonary hypertension, unspecified: Secondary | ICD-10-CM | POA: Insufficient documentation

## 2021-03-14 DIAGNOSIS — Z125 Encounter for screening for malignant neoplasm of prostate: Secondary | ICD-10-CM

## 2021-03-14 DIAGNOSIS — Z0000 Encounter for general adult medical examination without abnormal findings: Secondary | ICD-10-CM

## 2021-03-14 DIAGNOSIS — Z1211 Encounter for screening for malignant neoplasm of colon: Secondary | ICD-10-CM

## 2021-03-14 MED ORDER — SILDENAFIL CITRATE 100 MG PO TABS
100.0000 mg | ORAL_TABLET | ORAL | 0 refills | Status: DC | PRN
Start: 2021-03-14 — End: 2021-05-29

## 2021-03-14 NOTE — Progress Notes (Addendum)
Subjective:       Patient ID: [name] [name] is [name] 56 y.o. male.    HPI  Patient presents for an annual examination        ED  Started 6 months ago- has diff starting  Does not recall trigger  Getting worse  Plan  Will ch free and total  Testosterone and a1  Will pre viagra  If not better ref to urolocy    Last CPE 5 6 2021- Dr [name] [name]    Works:  mgr at [employer]    PMHX  3 8 2022Jeffrey Whitney MuseA Jackman, MD  History of chest pain syndrome normal ETT August 2021 vigorous exercise without symptoms  History of varicosities status post ablation postprocedural ultrasound without evidence of DVT  Tobacco use  History of polysubstance abuse  History of mild pulmonary hypertension on echocardiogram 08/16/2019  [name] [name] appears stable from [name] cardiovascular standpoint we discussed tobacco cessation.  Plan office visit and repeat echocardiogram reevaluate pulmonary hypertension in 1 year he will call me in the interim should he have any additional symptoms            1 11 22Homayoun Hashemi, MD   [name] [name] is [name] 56 y.o. male with history of venous insufficency. The patient has large varicosities at the left calf. He's S/P left great and small saphenous veins RFA in October. He returns today with complains of new bulging veins with pain, numbness and discomfort in the Left medial calf region..  Venous duplex performed [name] week ago in our vascular lab showed that the left great saphenous and the left mid small saphenous veins remain closed post ablation. However, reflux was seen in the left short saphenous vein from the sapheno-popliteal junction to the mid calf and multiple varicose veins are noted in the left  medial and posterior calf. Those varicose veins are measuring 12.753mm in the posteriorcalf and 8.306mm in the medial calf.   It appears that those varicose veins were too large to be treated alone with radiofrequency ablation. The patient will benefit more if  undergoes micro phlebectomy to remove those large varicose veins.       We reviewed the indications, benefits, and risks of microphlebectomy patient had questions which Dr. Lucianne MussHashemi anwsered to patient's satisfaction.    Risks of the procedure including but not limited to bleeding, infection,  Nerve damage, deep thrombosis. The patient demonstrated understanding. He  wants to think about it and research the procedure more. Informational pamphlet provided to him as well. The patient will call the office if he decides to undergo the reccomended procedure.      In the interim, he should continues to utilize compression stockings and  elevate left lower extremity when resting or sleeping and continue to utilize compression stockings.      Heroin us in the past-stopped 4/ 2021    Syphilis in the past    PSHX:  S/P left great and small saphenous veins RFA in October. 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   HENT:      Head: Normocephalic and atraumatic.   Cardiovascular:      Rate and Rhythm: Normal rate and regular rhythm.   Pulmonary:      Effort: Pulmonary effort is normal.   Abdominal:      Tenderness: There is no abdominal tenderness.   Neurological:      Mental Status: He is alert.   Psychiatric:          Mood and Affect: Mood normal.         Behavior: Behavior normal.         Thought Content: Thought content normal.         Judgment: Judgment normal.           Assessment:       1. Physical exam  CBC without differential    Comprehensive metabolic panel    Lipid panel    TSH    Hemoglobin A1C    Testosterone Free and Total    PSA    Stool Occult Blood, Immunoassay, (Not Guaiac Based)      2. Combined arterial insufficiency and corporo-venous occlusive erectile dysfunction  sildenafil (VIAGRA) 100 MG tablet    Testosterone Free and Total      3. Screening for malignant neoplasm of prostate  PSA      4. Screen for colon cancer  Stool Occult Blood, Immunoassay, (Not Guaiac Based)              Plan:      Physical   Health maintenance  - I rec 150 min moderate aerobic activity / week and 2 sessions of resistance training  - I rec use of sunscreen at least spf 35   - I rec seeing [name] dds every 6 months and an eye doctor ever year  - I rec the Mediterranean diet    High BMI Follow Up  BMI Follow Up Care Plan Documented  Encouragement to Exercise    Body mass index is 25.77 kg/m.  Discussed the patient's BMI with him.  The BMI is above average; BMI management plan is completed  Diet, exer          Assessment and Plan is included incorporated in the above note.    I asked the pt if he had any questions or concerns and he said no. I asked him to rtc/call for any issues.

## 2021-03-15 ENCOUNTER — Telehealth (INDEPENDENT_AMBULATORY_CARE_PROVIDER_SITE_OTHER): Payer: Self-pay | Admitting: Internal Medicine

## 2021-03-15 NOTE — Telephone Encounter (Signed)
Per pt PA is needed, please call pt once completed at [phone].    sildenafil (VIAGRA) 100 MG tablet

## 2021-03-20 ENCOUNTER — Other Ambulatory Visit (INDEPENDENT_AMBULATORY_CARE_PROVIDER_SITE_OTHER): Payer: Self-pay | Admitting: Internal Medicine

## 2021-03-20 DIAGNOSIS — N5203 Combined arterial insufficiency and corporo-venous occlusive erectile dysfunction: Secondary | ICD-10-CM

## 2021-03-20 NOTE — Telephone Encounter (Signed)
PA sent

## 2021-03-20 NOTE — Telephone Encounter (Signed)
Pls do pa and let pt know

## 2021-03-21 ENCOUNTER — Telehealth (INDEPENDENT_AMBULATORY_CARE_PROVIDER_SITE_OTHER): Payer: Self-pay | Admitting: Internal Medicine

## 2021-03-21 NOTE — Telephone Encounter (Signed)
PA sent. Awaiting response.

## 2021-03-21 NOTE — Telephone Encounter (Signed)
, °

## 2021-03-22 NOTE — Telephone Encounter (Signed)
Called and spoke with pt and informed of PA response (denied) and to try good rx. The patient verbalized understanding and agreed to try good rx.

## 2021-03-22 NOTE — Telephone Encounter (Signed)
Pa denied. Please see TE

## 2021-05-29 ENCOUNTER — Other Ambulatory Visit (INDEPENDENT_AMBULATORY_CARE_PROVIDER_SITE_OTHER): Payer: Self-pay | Admitting: Internal Medicine

## 2021-05-29 DIAGNOSIS — N5203 Combined arterial insufficiency and corporo-venous occlusive erectile dysfunction: Secondary | ICD-10-CM

## 2021-05-29 MED ORDER — SILDENAFIL CITRATE 100 MG PO TABS
100.0000 mg | ORAL_TABLET | ORAL | 6 refills | Status: AC | PRN
Start: 2021-05-29 — End: 2022-05-29

## 2022-01-17 ENCOUNTER — Telehealth: Payer: Self-pay | Admitting: Internal Medicine

## 2022-01-17 NOTE — Telephone Encounter (Signed)
The pt called and said that he needs the bottle for a med that helps with scabies. He says that the doctor has prescribed it before for him, please advise? He can't recall the name      Please send to    [id number] - [place], [place] - [address] AT [address] [place] Phone: [phone]   Fax: [phone]          Thank you

## 2022-01-19 ENCOUNTER — Encounter (INDEPENDENT_AMBULATORY_CARE_PROVIDER_SITE_OTHER): Payer: 59 | Admitting: Internal Medicine

## 2022-01-19 NOTE — Progress Notes (Deleted)
Subjective:       Patient ID: [name] [name] is a 56 y.o. male.    HPI    NO SHOW

## 2022-01-20 NOTE — Progress Notes (Signed)
[facility] VISIT           [name] [name]  is a 56 y.o.  male.  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 - Topical    Sent to pharmacy as: permethrin (ELIMITE) 5 % cream    Class: E-Rx      Risk & Benefits of the new medication(s) were explained to the patient (and family) who verbalized understanding & agreed to the treatment plan. 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 appears stable from a cardiovascular standpoint we discussed tobacco cessation.  Plan office visit and repeat echocardiogram reevaluate pulmonary hypertension in 1 year he will call me in the interim should he have any additional symptoms            1 11 22Homayoun Hashemi, MD   [name] [name] is a 56 y.o. male with history of venous insufficency. The patient has large varicosities at the left calf. He's S/P left great and small saphenous veins RFA in October. He returns today with complains of new bulging veins with pain, numbness and discomfort in the Left medial calf region..  Venous duplex performed a week ago in our  vascular lab showed that the left great saphenous and the left mid small saphenous veins remain closed post ablation. However, reflux was seen in the left short saphenous vein from the sapheno-popliteal junction to the mid calf and multiple varicose veins are noted in the left  medial and posterior calf. Those varicose veins are measuring 12.68mm in the posteriorcalf and 8.78mm in the medial calf.   It appears that those varicose veins were too large to be treated alone with radiofrequency ablation. The patient will benefit more if  undergoes micro phlebectomy to remove those large varicose veins.      We reviewed the indications, benefits, and risks of microphlebectomy patient had questions which Dr. Lucianne [name] anwsered to patient's satisfaction.    Risks of the procedure including but not limited to bleeding, infection,  Nerve damage, deep thrombosis. The patient demonstrated understanding. He  wants to think about it and research the procedure more. Informational pamphlet provided to him as well. The patient will call the office if he decides to undergo the reccomended procedure.      In the interim, he should continues to utilize compression stockings and  elevate left lower extremity when resting or sleeping and continue to utilize compression stockings.  Heroin [place] in the past-stopped 4/ 2021    Syphilis in the past      ED 1 17 23   Started 6 months ago- has diff starting  Does not recall trigger  Getting worse  Plan  Will ch free and total  Testosterone and a1  Will pre viagra  If not better ref to urology    PSHX:  S/P left great and small saphenous veins RFA in October. 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   Neurological:      Mental Status: He is alert.   Psychiatric:         Behavior: Behavior normal.          1. Scabies  permethrin (ELIMITE) 5 % cream              Assessment and Plan is included incorporated in the above note.

## 2022-01-22 ENCOUNTER — Ambulatory Visit (INDEPENDENT_AMBULATORY_CARE_PROVIDER_SITE_OTHER): Payer: 59 | Admitting: Internal Medicine

## 2022-01-22 ENCOUNTER — Encounter (INDEPENDENT_AMBULATORY_CARE_PROVIDER_SITE_OTHER): Payer: Self-pay | Admitting: Internal Medicine

## 2022-01-22 VITALS — BP 116/82 | HR 99 | Temp 98.7°F

## 2022-01-22 DIAGNOSIS — B86 Scabies: Secondary | ICD-10-CM

## 2022-01-22 MED ORDER — PERMETHRIN 5 % EX CREA
TOPICAL_CREAM | Freq: Once | CUTANEOUS | 1 refills | Status: DC
Start: 2022-01-22 — End: 2022-02-12

## 2022-02-12 ENCOUNTER — Other Ambulatory Visit (INDEPENDENT_AMBULATORY_CARE_PROVIDER_SITE_OTHER): Payer: Self-pay | Admitting: Internal Medicine

## 2022-02-12 DIAGNOSIS — B86 Scabies: Secondary | ICD-10-CM

## 2022-02-12 MED ORDER — PERMETHRIN 5 % EX CREA
TOPICAL_CREAM | Freq: Once | CUTANEOUS | 1 refills | Status: DC
Start: 2022-02-12 — End: 2022-02-13

## 2022-02-12 NOTE — Telephone Encounter (Signed)
Name, strength, directions of requested refill(s):    permethrin (ELIMITE) 5 % cream     How much medication is remaining: 0    Pharmacy to send refill to or patient to pick up rx from office ([place] requested pharmacy in [place]):      [id number] - [place], [place] - [address] AT [facility] [address]  [address]  [place] [address]  Phone: [phone] Fax: [phone]    [id number] - [place], [place] - [address]  [address] [place] [address]  Phone: [phone] Fax: [phone]        Please Shaquelle "X" next to the preferred call back number:    Mobile: [phone] (mobile) x   Home: [phone] (home)    Work: @WORKPHONE @        Medication refill request, see above. Thank you   Patient has been informed that medication refill requests should be called in up to one week prior to running out of medication.    Additional Notes:  Next Visit: none/ last ov 03/14/21

## 2022-02-13 ENCOUNTER — Other Ambulatory Visit (INDEPENDENT_AMBULATORY_CARE_PROVIDER_SITE_OTHER): Payer: Self-pay | Admitting: Internal Medicine

## 2022-02-13 ENCOUNTER — Telehealth (INDEPENDENT_AMBULATORY_CARE_PROVIDER_SITE_OTHER): Payer: Self-pay | Admitting: Internal Medicine

## 2022-02-13 DIAGNOSIS — B86 Scabies: Secondary | ICD-10-CM

## 2022-02-13 MED ORDER — PERMETHRIN 5 % EX CREA
TOPICAL_CREAM | Freq: Once | CUTANEOUS | 1 refills | Status: DC
Start: 2022-02-13 — End: 2022-02-13

## 2022-02-13 MED ORDER — PERMETHRIN 5 % EX CREA
TOPICAL_CREAM | Freq: Once | CUTANEOUS | 1 refills | Status: AC
Start: 2022-02-13 — End: 2022-02-13

## 2022-02-13 NOTE — Telephone Encounter (Signed)
Patient is requesting for his medication to be sent to the Right Pharmacy       permethrin (ELIMITE) 5 % cream (Order [id number])     Was sent to the wrong pharmacy. Medication needed to be sent to [facility]. The information is below.     [facility] [address] [place] - [address]  [address] [place] [address]  Phone: [phone] Fax: [phone]        Please give patient a call when ready     Patient phone number is [phone]

## 2022-02-13 NOTE — Telephone Encounter (Signed)
Please send the medication refill to the [facility] pharmacy indicated in the previous message.     Patient would like a phone call one this has been completed.     Thanks.

## 2022-07-05 ENCOUNTER — Other Ambulatory Visit (INDEPENDENT_AMBULATORY_CARE_PROVIDER_SITE_OTHER): Payer: Self-pay | Admitting: Internal Medicine

## 2022-07-05 DIAGNOSIS — N5203 Combined arterial insufficiency and corporo-venous occlusive erectile dysfunction: Secondary | ICD-10-CM
